# Patient Record
Sex: Male | Born: 1945 | Race: White | Hispanic: No | Marital: Married | Smoking: Former smoker
Health system: Southern US, Community
[De-identification: ages and names within clinical notes are randomized; demographics above are authoritative.]

## PROBLEM LIST (undated history)

## (undated) DIAGNOSIS — Z91199 Patient's noncompliance with other medical treatment and regimen due to unspecified reason: Secondary | ICD-10-CM

## (undated) DIAGNOSIS — I4891 Unspecified atrial fibrillation: Secondary | ICD-10-CM

## (undated) DIAGNOSIS — I5022 Chronic systolic (congestive) heart failure: Secondary | ICD-10-CM

## (undated) DIAGNOSIS — I739 Peripheral vascular disease, unspecified: Secondary | ICD-10-CM

## (undated) DIAGNOSIS — I2510 Atherosclerotic heart disease of native coronary artery without angina pectoris: Secondary | ICD-10-CM

## (undated) DIAGNOSIS — IMO0002 Reserved for concepts with insufficient information to code with codable children: Secondary | ICD-10-CM

## (undated) DIAGNOSIS — Z9119 Patient's noncompliance with other medical treatment and regimen: Secondary | ICD-10-CM

## (undated) DIAGNOSIS — I255 Ischemic cardiomyopathy: Secondary | ICD-10-CM

## (undated) DIAGNOSIS — F1011 Alcohol abuse, in remission: Secondary | ICD-10-CM

## (undated) DIAGNOSIS — E119 Type 2 diabetes mellitus without complications: Secondary | ICD-10-CM

## (undated) DIAGNOSIS — E785 Hyperlipidemia, unspecified: Secondary | ICD-10-CM

## (undated) HISTORY — DX: Hyperlipidemia, unspecified: E78.5

## (undated) HISTORY — PX: LUMBAR FUSION: SHX111

## (undated) HISTORY — PX: BACK SURGERY: SHX140

## (undated) HISTORY — DX: Peripheral vascular disease, unspecified: I73.9

## (undated) HISTORY — DX: Reserved for concepts with insufficient information to code with codable children: IMO0002

---

## 1993-12-22 DIAGNOSIS — I739 Peripheral vascular disease, unspecified: Secondary | ICD-10-CM

## 1993-12-22 HISTORY — PX: CHOLECYSTECTOMY: SHX55

## 1993-12-22 HISTORY — PX: AORTO-FEMORAL BYPASS GRAFT: SHX885

## 1993-12-22 HISTORY — DX: Peripheral vascular disease, unspecified: I73.9

## 2004-11-30 ENCOUNTER — Emergency Department (HOSPITAL_COMMUNITY): Admission: EM | Admit: 2004-11-30 | Discharge: 2004-11-30 | Payer: Self-pay | Admitting: Emergency Medicine

## 2005-01-12 ENCOUNTER — Emergency Department (HOSPITAL_COMMUNITY): Admission: EM | Admit: 2005-01-12 | Discharge: 2005-01-12 | Payer: Self-pay | Admitting: Emergency Medicine

## 2008-05-12 ENCOUNTER — Inpatient Hospital Stay (HOSPITAL_COMMUNITY): Admission: EM | Admit: 2008-05-12 | Discharge: 2008-05-17 | Payer: Self-pay | Admitting: Emergency Medicine

## 2008-05-12 ENCOUNTER — Ambulatory Visit: Payer: Self-pay | Admitting: Cardiology

## 2008-05-29 ENCOUNTER — Ambulatory Visit: Payer: Self-pay | Admitting: Cardiology

## 2008-09-05 ENCOUNTER — Ambulatory Visit: Payer: Self-pay | Admitting: Cardiology

## 2008-09-05 ENCOUNTER — Inpatient Hospital Stay (HOSPITAL_COMMUNITY): Admission: AD | Admit: 2008-09-05 | Discharge: 2008-09-07 | Payer: Self-pay | Admitting: Cardiology

## 2008-09-06 ENCOUNTER — Encounter: Payer: Self-pay | Admitting: Cardiology

## 2008-09-15 ENCOUNTER — Ambulatory Visit (HOSPITAL_COMMUNITY): Admission: RE | Admit: 2008-09-15 | Discharge: 2008-09-15 | Payer: Self-pay | Admitting: Family Medicine

## 2008-09-18 ENCOUNTER — Ambulatory Visit: Payer: Self-pay | Admitting: Cardiology

## 2008-11-28 ENCOUNTER — Emergency Department (HOSPITAL_COMMUNITY): Admission: EM | Admit: 2008-11-28 | Discharge: 2008-11-28 | Payer: Self-pay | Admitting: Emergency Medicine

## 2008-12-27 ENCOUNTER — Ambulatory Visit: Payer: Self-pay | Admitting: Cardiology

## 2009-03-09 ENCOUNTER — Encounter (INDEPENDENT_AMBULATORY_CARE_PROVIDER_SITE_OTHER): Payer: Self-pay | Admitting: *Deleted

## 2009-03-09 LAB — CONVERTED CEMR LAB
ALT: 16 units/L
AST: 14 units/L
Albumin: 4.7 g/dL
BUN: 14 mg/dL
CO2: 22 meq/L
Calcium: 9.7 mg/dL
Chloride: 104 meq/L
Cholesterol: 170 mg/dL
Creatinine, Ser: 0.96 mg/dL
Glucose, Bld: 179 mg/dL
HDL: 32 mg/dL
LDL Cholesterol: 93 mg/dL
Potassium: 4.7 meq/L
Sodium: 139 meq/L
Total Protein: 7.4 g/dL
Triglycerides: 225 mg/dL

## 2009-04-25 ENCOUNTER — Ambulatory Visit: Payer: Self-pay | Admitting: Cardiology

## 2009-06-04 ENCOUNTER — Encounter (INDEPENDENT_AMBULATORY_CARE_PROVIDER_SITE_OTHER): Payer: Self-pay | Admitting: *Deleted

## 2009-06-04 LAB — CONVERTED CEMR LAB
ALT: 11 units/L
AST: 9 units/L
Albumin: 4.1 g/dL
Alkaline Phosphatase: 67 units/L
CO2: 22 meq/L
Calcium: 9.2 mg/dL
Cholesterol: 165 mg/dL
Creatinine, Ser: 1.05 mg/dL
Free T4: 9 ng/dL
Glucose, Bld: 258 mg/dL
HCT: 51.3 %
HDL: 35 mg/dL
Hemoglobin: 17 g/dL
Hgb A1c MFr Bld: 9.5 %
MCV: 96.4 fL
Platelets: 179 10*3/uL
Potassium: 4.4 meq/L
Sodium: 139 meq/L
Total Protein: 7 g/dL
Triglycerides: 247 mg/dL
WBC: 8 10*3/uL

## 2010-01-29 ENCOUNTER — Inpatient Hospital Stay (HOSPITAL_COMMUNITY): Admission: EM | Admit: 2010-01-29 | Discharge: 2010-02-01 | Payer: Self-pay | Admitting: Cardiology

## 2010-01-29 ENCOUNTER — Ambulatory Visit: Payer: Self-pay | Admitting: Cardiology

## 2010-01-30 ENCOUNTER — Encounter (INDEPENDENT_AMBULATORY_CARE_PROVIDER_SITE_OTHER): Payer: Self-pay | Admitting: *Deleted

## 2010-01-30 LAB — CONVERTED CEMR LAB
ALT: 15 units/L
AST: 15 units/L
Albumin: 3.5 g/dL
Alkaline Phosphatase: 61 units/L
BUN: 9 mg/dL
CO2: 25 meq/L
Calcium: 8.5 mg/dL
Chloride: 99 meq/L
GFR calc non Af Amer: >60 mL/min
Glomerular Filtration Rate, Af Am: >60 mL/min/{1.73_m2}
Glucose, Bld: 212 mg/dL
Potassium: 4 meq/L
Sodium: 131 meq/L
Total Protein: 6.4 g/dL

## 2010-02-01 ENCOUNTER — Encounter (INDEPENDENT_AMBULATORY_CARE_PROVIDER_SITE_OTHER): Payer: Self-pay | Admitting: *Deleted

## 2010-02-01 LAB — CONVERTED CEMR LAB
BUN: 8 mg/dL
CO2: 27 meq/L
Chloride: 101 meq/L
GFR calc non Af Amer: >60 mL/min
Glomerular Filtration Rate, Af Am: >60 mL/min/{1.73_m2}
Glucose, Bld: 193 mg/dL
Potassium: 4.4 meq/L
Sodium: 133 meq/L

## 2010-02-08 ENCOUNTER — Encounter (INDEPENDENT_AMBULATORY_CARE_PROVIDER_SITE_OTHER): Payer: Self-pay | Admitting: *Deleted

## 2010-02-08 ENCOUNTER — Ambulatory Visit: Payer: Self-pay | Admitting: Cardiovascular Disease

## 2010-02-08 ENCOUNTER — Encounter: Payer: Self-pay | Admitting: Adult Health

## 2010-05-06 ENCOUNTER — Encounter (INDEPENDENT_AMBULATORY_CARE_PROVIDER_SITE_OTHER): Payer: Self-pay | Admitting: *Deleted

## 2010-05-06 DIAGNOSIS — I739 Peripheral vascular disease, unspecified: Secondary | ICD-10-CM | POA: Insufficient documentation

## 2010-05-06 DIAGNOSIS — E119 Type 2 diabetes mellitus without complications: Secondary | ICD-10-CM | POA: Insufficient documentation

## 2010-05-06 DIAGNOSIS — E785 Hyperlipidemia, unspecified: Secondary | ICD-10-CM | POA: Insufficient documentation

## 2010-05-06 DIAGNOSIS — F17200 Nicotine dependence, unspecified, uncomplicated: Secondary | ICD-10-CM | POA: Insufficient documentation

## 2010-05-07 ENCOUNTER — Encounter (INDEPENDENT_AMBULATORY_CARE_PROVIDER_SITE_OTHER): Payer: Self-pay | Admitting: *Deleted

## 2010-05-07 ENCOUNTER — Ambulatory Visit: Payer: Self-pay | Admitting: Cardiology

## 2010-05-28 ENCOUNTER — Encounter: Payer: Self-pay | Admitting: Cardiology

## 2010-06-07 ENCOUNTER — Encounter (INDEPENDENT_AMBULATORY_CARE_PROVIDER_SITE_OTHER): Payer: Self-pay | Admitting: *Deleted

## 2010-06-07 LAB — CONVERTED CEMR LAB
ALT: 13 units/L (ref 0–53)
AST: 11 units/L (ref 0–37)
Albumin: 4.5 g/dL (ref 3.5–5.2)
Alkaline Phosphatase: 66 units/L (ref 39–117)
BUN: 23 mg/dL (ref 6–23)
Basophils Absolute: 0.1 10*3/uL (ref 0.0–0.1)
Basophils Relative: 1 % (ref 0–1)
Calcium: 10.1 mg/dL (ref 8.4–10.5)
Chloride: 103 meq/L (ref 96–112)
Cholesterol: 255 mg/dL — ABNORMAL HIGH (ref 0–200)
Creatinine, Ser: 1.06 mg/dL (ref 0.40–1.50)
Eosinophils Absolute: 0.3 10*3/uL (ref 0.0–0.7)
Glucose, Bld: 280 mg/dL — ABNORMAL HIGH (ref 70–99)
HDL: 32 mg/dL — ABNORMAL LOW (ref >39)
Hemoglobin: 16.6 g/dL (ref 13.0–17.0)
LDL Cholesterol: 152 mg/dL — ABNORMAL HIGH (ref 0–99)
Lymphocytes Relative: 26 % (ref 12–46)
MCHC: 34.5 g/dL (ref 30.0–36.0)
Monocytes Relative: 7 % (ref 3–12)
Neutro Abs: 5.6 10*3/uL (ref 1.7–7.7)
Neutrophils Relative %: 63 % (ref 43–77)
Platelets: 204 10*3/uL (ref 150–400)
Potassium: 5.4 meq/L — ABNORMAL HIGH (ref 3.5–5.3)
RBC: 5.22 M/uL (ref 4.22–5.81)
RDW: 13.2 % (ref 11.5–15.5)
Sodium: 137 meq/L (ref 135–145)
Total Bilirubin: 0.4 mg/dL (ref 0.3–1.2)
Total CHOL/HDL Ratio: 8
Total Protein: 7.3 g/dL (ref 6.0–8.3)
Triglycerides: 356 mg/dL — ABNORMAL HIGH (ref <150)
VLDL: 71 mg/dL — ABNORMAL HIGH (ref 0–40)
WBC: 8.8 10*3/uL (ref 4.0–10.5)

## 2010-06-10 ENCOUNTER — Telehealth (INDEPENDENT_AMBULATORY_CARE_PROVIDER_SITE_OTHER): Payer: Self-pay | Admitting: *Deleted

## 2010-06-11 ENCOUNTER — Observation Stay (HOSPITAL_COMMUNITY): Admission: EM | Admit: 2010-06-11 | Discharge: 2010-06-12 | Payer: Self-pay | Admitting: Emergency Medicine

## 2010-06-12 ENCOUNTER — Encounter: Payer: Self-pay | Admitting: Cardiology

## 2010-06-12 ENCOUNTER — Ambulatory Visit: Payer: Self-pay | Admitting: Cardiology

## 2010-06-13 ENCOUNTER — Telehealth (INDEPENDENT_AMBULATORY_CARE_PROVIDER_SITE_OTHER): Payer: Self-pay | Admitting: *Deleted

## 2010-06-13 ENCOUNTER — Encounter (INDEPENDENT_AMBULATORY_CARE_PROVIDER_SITE_OTHER): Payer: Self-pay | Admitting: *Deleted

## 2010-07-08 LAB — CONVERTED CEMR LAB
Calcium: 9.4 mg/dL (ref 8.4–10.5)
Chloride: 104 meq/L (ref 96–112)
Creatinine, Ser: 0.94 mg/dL (ref 0.40–1.50)
Glucose, Bld: 255 mg/dL — ABNORMAL HIGH (ref 70–99)
HDL: 31 mg/dL — ABNORMAL LOW (ref >39)
LDL Cholesterol: 60 mg/dL (ref 0–99)
Potassium: 4.6 meq/L (ref 3.5–5.3)
Sodium: 140 meq/L (ref 135–145)
Total CHOL/HDL Ratio: 4.5
Triglycerides: 245 mg/dL — ABNORMAL HIGH (ref <150)
VLDL: 49 mg/dL — ABNORMAL HIGH (ref 0–40)

## 2010-08-06 ENCOUNTER — Telehealth (INDEPENDENT_AMBULATORY_CARE_PROVIDER_SITE_OTHER): Payer: Self-pay | Admitting: *Deleted

## 2010-08-07 ENCOUNTER — Encounter (INDEPENDENT_AMBULATORY_CARE_PROVIDER_SITE_OTHER): Payer: Self-pay | Admitting: *Deleted

## 2010-08-07 LAB — CONVERTED CEMR LAB
BUN: 10 mg/dL
BUN: 10 mg/dL
BUN: 10 mg/dL (ref 6–23)
CO2: 25 meq/L
CO2: 25 meq/L
CO2: 25 meq/L (ref 19–32)
Calcium: 9.3 mg/dL
Calcium: 9.3 mg/dL (ref 8.4–10.5)
Chloride: 102 meq/L
Chloride: 102 meq/L
Chloride: 102 meq/L (ref 96–112)
Creatinine, Ser: 0.93 mg/dL
Creatinine, Ser: 0.93 mg/dL
Creatinine, Ser: 0.93 mg/dL (ref 0.40–1.50)
Glucose, Bld: 153 mg/dL
Potassium: 4.7 meq/L
Potassium: 4.7 meq/L
Potassium: 4.7 meq/L (ref 3.5–5.3)
Sodium: 135 meq/L
Sodium: 135 meq/L
Sodium: 135 meq/L (ref 135–145)

## 2010-08-14 ENCOUNTER — Encounter (INDEPENDENT_AMBULATORY_CARE_PROVIDER_SITE_OTHER): Payer: Self-pay | Admitting: *Deleted

## 2010-08-17 ENCOUNTER — Ambulatory Visit: Payer: Self-pay | Admitting: Internal Medicine

## 2010-08-17 ENCOUNTER — Inpatient Hospital Stay (HOSPITAL_COMMUNITY): Admission: EM | Admit: 2010-08-17 | Discharge: 2010-08-20 | Payer: Self-pay | Admitting: Cardiology

## 2010-08-28 ENCOUNTER — Encounter: Payer: Self-pay | Admitting: Cardiology

## 2010-09-02 ENCOUNTER — Ambulatory Visit: Payer: Self-pay | Admitting: Cardiology

## 2010-09-03 ENCOUNTER — Encounter: Payer: Self-pay | Admitting: Adult Health

## 2010-09-12 ENCOUNTER — Ambulatory Visit: Payer: Self-pay | Admitting: Cardiology

## 2010-09-12 ENCOUNTER — Inpatient Hospital Stay (HOSPITAL_COMMUNITY): Admission: EM | Admit: 2010-09-12 | Discharge: 2010-09-15 | Payer: Self-pay | Admitting: Emergency Medicine

## 2010-09-13 ENCOUNTER — Encounter: Payer: Self-pay | Admitting: Cardiology

## 2010-09-13 ENCOUNTER — Encounter (INDEPENDENT_AMBULATORY_CARE_PROVIDER_SITE_OTHER): Payer: Self-pay | Admitting: Family Medicine

## 2010-10-01 ENCOUNTER — Telehealth (INDEPENDENT_AMBULATORY_CARE_PROVIDER_SITE_OTHER): Payer: Self-pay

## 2010-10-03 ENCOUNTER — Ambulatory Visit: Payer: Self-pay | Admitting: Cardiology

## 2010-10-03 ENCOUNTER — Encounter: Payer: Self-pay | Admitting: Adult Health

## 2010-10-04 ENCOUNTER — Encounter: Payer: Self-pay | Admitting: Adult Health

## 2010-11-04 ENCOUNTER — Ambulatory Visit (HOSPITAL_COMMUNITY): Admission: RE | Admit: 2010-11-04 | Payer: Self-pay | Admitting: Cardiology

## 2010-11-07 ENCOUNTER — Telehealth (INDEPENDENT_AMBULATORY_CARE_PROVIDER_SITE_OTHER): Payer: Self-pay | Admitting: *Deleted

## 2010-12-02 ENCOUNTER — Telehealth (INDEPENDENT_AMBULATORY_CARE_PROVIDER_SITE_OTHER): Payer: Self-pay | Admitting: *Deleted

## 2010-12-09 ENCOUNTER — Encounter (INDEPENDENT_AMBULATORY_CARE_PROVIDER_SITE_OTHER): Payer: Self-pay | Admitting: *Deleted

## 2011-01-03 ENCOUNTER — Telehealth (INDEPENDENT_AMBULATORY_CARE_PROVIDER_SITE_OTHER): Payer: Self-pay

## 2011-01-03 ENCOUNTER — Encounter (INDEPENDENT_AMBULATORY_CARE_PROVIDER_SITE_OTHER): Payer: Self-pay | Admitting: *Deleted

## 2011-01-03 ENCOUNTER — Encounter (INDEPENDENT_AMBULATORY_CARE_PROVIDER_SITE_OTHER): Payer: Self-pay

## 2011-01-07 ENCOUNTER — Ambulatory Visit: Admit: 2011-01-07 | Payer: Self-pay | Admitting: Cardiology

## 2011-01-08 ENCOUNTER — Ambulatory Visit (HOSPITAL_COMMUNITY)
Admission: RE | Admit: 2011-01-08 | Discharge: 2011-01-08 | Payer: Self-pay | Source: Home / Self Care | Attending: Cardiology | Admitting: Cardiology

## 2011-01-10 ENCOUNTER — Encounter: Payer: Self-pay | Admitting: Cardiology

## 2011-01-15 ENCOUNTER — Ambulatory Visit (HOSPITAL_COMMUNITY)
Admission: RE | Admit: 2011-01-15 | Discharge: 2011-01-15 | Payer: Self-pay | Source: Home / Self Care | Attending: Family Medicine | Admitting: Family Medicine

## 2011-01-20 ENCOUNTER — Encounter (INDEPENDENT_AMBULATORY_CARE_PROVIDER_SITE_OTHER): Payer: Self-pay | Admitting: *Deleted

## 2011-01-21 ENCOUNTER — Encounter: Payer: Self-pay | Admitting: Cardiology

## 2011-01-21 ENCOUNTER — Ambulatory Visit
Admission: RE | Admit: 2011-01-21 | Discharge: 2011-01-21 | Payer: Self-pay | Source: Home / Self Care | Attending: Cardiology | Admitting: Cardiology

## 2011-01-21 LAB — CONVERTED CEMR LAB
ALT: 12 units/L (ref 0–53)
Albumin: 4.3 g/dL (ref 3.5–5.2)
Alkaline Phosphatase: 54 units/L (ref 39–117)
BUN: 20 mg/dL (ref 6–23)
CO2: 24 meq/L (ref 19–32)
Calcium: 9.5 mg/dL (ref 8.4–10.5)
Chloride: 99 meq/L (ref 96–112)
Creatinine, Ser: 1.18 mg/dL (ref 0.40–1.50)
Glucose, Bld: 349 mg/dL — ABNORMAL HIGH (ref 70–99)
Hgb A1c MFr Bld: 13.5 % — ABNORMAL HIGH (ref <5.7)
Potassium: 4.3 meq/L (ref 3.5–5.3)
Sodium: 136 meq/L (ref 135–145)
Total Protein: 6.8 g/dL (ref 6.0–8.3)

## 2011-01-21 NOTE — Letter (Signed)
Summary: [employer] AUT[employer]IZATION  [employer] AUT[employer]IZATION   Imported By: Faythe Ghee 08/28/2010 14:18:47  _____________________________________________________________________  External Attachment:    Type:   Image     Comment:   External Document

## 2011-01-21 NOTE — Progress Notes (Signed)
Summary: rx refill  Phone Note From Pharmacy Call back at [phone]   Request: Speak with Nurse Summary of Call: pradaxa 150 mg needs called in to [facility]  pharmacy Initial call taken by: Faythe Ghee,  November 07, 2010 3:55 PM    Prescriptions: PRADAXA 150 MG CAPS (DABIGATRAN ETEXILATE MESYLATE) take 1 tab two times a day  #60 x 6   Entered by:   Teressa Lower RN   Authorized by:   Kathlen Brunswick, MD, [facility]   Signed by:   Teressa Lower RN on 11/07/2010   Method used:   Electronically to        [facility]* (retail)       [address]       [place], [place]  [address]       Ph: [phone] or [phone]       Fax: [phone]   RxID:   [id number]

## 2011-01-21 NOTE — Letter (Signed)
Summary: [place] [facility] Work Engineer, agricultural at [employer]  [address] [place] [address]   Phone: [phone]  Fax: [phone]     June 07, 2010 MRN: [id number]   [name] [address] [place], [place]  [address]      YOUR LAB WORK IS DUE   July 08, 2010 Please go to [facility], located across the street from [facility] on the second floor.  Hours are Monday - Friday 7am until 7:30pm         Saturday 8am until 12noon    _X_  DO NOT EAT OR DRINK AFTER MIDNIGHT EVENING PRIOR TO LABWORK  __ YOUR LABWORK IS NOT FASTING --YOU MAY EAT PRIOR TO LABWORK

## 2011-01-21 NOTE — Miscellaneous (Signed)
Summary: hospital labs  Clinical Lists Changes  Observations: Added new observation of CALCIUM: 8.6 mg/dL (98/10/9146 8:29) Added new observation of GFR AA: >60 mL/min/1.73m2 (02/01/2010 9:03) Added new observation of GFR: >60 mL/min (02/01/2010 9:03) Added new observation of CREATININE: 101 mg/dL (56/21/3086 5:78) Added new observation of BUN: 8 mg/dL (46/96/2952 8:41) Added new observation of BG RANDOM: 193 mg/dL (32/44/0102 7:25) Added new observation of CO2 PLSM/SER: 27 meq/L (02/01/2010 9:03) Added new observation of CL SERUM: 101 meq/L (02/01/2010 9:03) Added new observation of K SERUM: 4.4 meq/L (02/01/2010 9:03) Added new observation of NA: 133 meq/L (02/01/2010 9:03) Added new observation of CALCIUM: 8.5 mg/dL (36/64/4034 7:42) Added new observation of ALBUMIN: 3.5 g/dL (59/56/3875 6:43) Added new observation of PROTEIN, TOT: 6.4 g/dL (32/95/1884 1:66) Added new observation of SGPT (ALT): 15 units/L (01/30/2010 9:03) Added new observation of SGOT (AST): 15 units/L (01/30/2010 9:03) Added new observation of ALK PHOS: 61 units/L (01/30/2010 9:03) Added new observation of GFR AA: >60 mL/min/1.53m2 (01/30/2010 9:03) Added new observation of GFR: >60 mL/min (01/30/2010 9:03) Added new observation of CREATININE: 0.85 mg/dL (06/20/1600 0:93) Added new observation of BUN: 9 mg/dL (23/55/7322 0:25) Added new observation of BG RANDOM: 212 mg/dL (42/70/6237 6:28) Added new observation of CO2 PLSM/SER: 25 meq/L (01/30/2010 9:03) Added new observation of CL SERUM: 99 meq/L (01/30/2010 9:03) Added new observation of K SERUM: 4.0 meq/L (01/30/2010 9:03) Added new observation of NA: 131 meq/L (01/30/2010 9:03)

## 2011-01-21 NOTE — Assessment & Plan Note (Signed)
Summary: eph   Visit Type:  Follow-up Primary Provider:  Dr.Angus [name]  CC:  no complaints today.  History of Present Illness: Mr.Kobs is a 65 y/o CM with known history of CAD. We are seeing him on follow-up after being admitted to [facility] after c/o chest pain.  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  He also had PCI to SVG to RCA placed in overlapping fasion of prior stent placed in 01/26/2010.  He also had ICM with EF of 35%-40%. PVD, s/p Fem Pop 1995, CABG 1995, NIDDM, ongoing tobacco abuse.  Since discharge he has been pain free, no SOB.  He complains of soreness in his R groin when he climbs up into his truck only.  He unfortunately continues to smoke.  No plans to quit.  Otherwise, he is without complaint. He is NOT on BB secondary to symptomatic bradycardia.  Preventive Screening-Counseling & Management  Alcohol-Tobacco     Smoking Status: current     Smoking Cessation Counseling: yes     Smoke Cessation Stage: precontemplative     Packs/Day: 1.0  Current Medications (verified): 1)  Metformin Hcl 500 Mg Tabs (Metformin Hcl) .... One Tablet Twice A Day 2)  Plavix 75 Mg Tabs (Clopidogrel Bisulfate) .... Take 1 Tab Daily 3)  Nitroglycerin 0.4 Mg/hr Pt24 (Nitroglycerin) .... Take As Directed For Chest Pain 4)  Simvastatin 40 Mg Tabs (Simvastatin) .... Take 1 Tab Daily 5)  Aspir-Trin 325 Mg Tbec (Aspirin) .... Take 1 Tab Daily 6)  Vicodin 5-500 Mg Tabs (Hydrocodone-Acetaminophen) .... Take As Needed For Pain  Allergies (verified): No Known Drug Allergies PMH-FH-SH reviewed-no changes except otherwise noted  Social History: Packs/Day:  1.0  Review of Systems       Right groin soreness  All other systems have been reviewed and are negative unless stated  above.   Vital Signs:  Patient profile:   65 year old male Height:      69 inches Weight:      198 pounds BMI:     29.35 Pulse rate:   64 / minute BP sitting:   132 / 74  (right arm)  Vitals Entered By: Dreama Saa, CNA (February 08, 2010 11:18 AM)  Physical Exam  General:  Well developed, well nourished, in no acute distress. Lungs:  Clear bilaterally to auscultation and percussion. Heart:  Non-displaced PMI, chest non-tender; regular rate and rhythm, S1, S2 without murmurs, rubs or gallops. Carotid upstroke normal, no bruit. Normal abdominal aortic size, no bruits. Femorals R bruit noted, good pulses, Pedals normal pulses. No edema, no varicosities. Abdomen:  Bowel sounds positive; abdomen soft and non-tender without masses, organomegaly, or hernias noted. No hepatosplenomegaly. Msk:  Back normal, normal gait. Muscle strength and tone normal. Extremities:  No bleed.,hematoma or bruising at R groin site. Psych:  Normal affect.   EKG  Procedure date:  02/08/2010  Findings:      Normal sinus rhythm with rate of:65 bpm  First degree AV-Block noted.    Comments:      Evidence of remote anterior MI.  Evidence of remote inferior MI.    Impression & Recommendations:  Problem # 1:  CORONARY ATHEROSCLEROSIS NATIVE CORONARY ARTERY (ICD-414.01) He is doing well s/p PCI and PTCA of existing  stents.  He is asymptomatic.  He will not be placed on BB as he is intolerant causing symptomatic bradycardia.  This was removed during hospitalization. The following medications were removed from the medication list:    Metoprolol Tartrate 25 Mg Tabs (Metoprolol tartrate) .Marland Kitchen... Take 1 tablet by mouth once a day His updated medication list for this problem includes:    Plavix 75 Mg Tabs (Clopidogrel bisulfate) .Marland Kitchen... Take 1 tab daily    Nitroglycerin 0.4 Mg/hr Pt24 (Nitroglycerin) .Marland Kitchen... Take as directed for chest pain    Aspir-trin 325 Mg Tbec (Aspirin) .Marland Kitchen... Take 1 tab daily  Problem # 2:   TOBACCO ABUSE (ICD-305.1) Continued counseling of cessation is  done.  He verbalzes understanding but is not planning to quit.  Problem # 3:  PVD (ICD-443.9) I auscultated a R femeral bruit. With known Fem/Pop bypass, this should be monitored for need for reevaluation.  Problem # 4:  MIXED HYPERLIPIDEMIA (ICD-272.2) Continue meds as directed. Follow up lipids and LFT's on next visit. His updated medication list for this problem includes:    Simvastatin 40 Mg Tabs (Simvastatin) .Marland Kitchen... Take 1 tab daily  Patient Instructions: 1)  Your physician recommends that you schedule a follow-up appointment in: 3 months

## 2011-01-21 NOTE — Letter (Signed)
Summary: [place] Results Engineer, agricultural at [facility]  [address] [place] [address]   Phone: [phone]  Fax: [phone]      June 07, 2010 MRN: [id number]   [name] [address] [place], [place]  [address]   Dear [name],  Your test ordered by Selena Batten has been reviewed by your physician (or physician assistant) and was found to be normal or stable. Your physician (or physician assistant) felt no changes were needed at this time.  ____ Echocardiogram  ____ Cardiac Stress Test  __x__ Lab Work  ____ Peripheral vascular study of arms, legs or neck  ____ CT scan or X-ray  ____ Lung or Breathing test  ____ Other:  Please discontinue simvastatin and start crestor 40mg  daily.  We will recheck your cholesterol in 1 month.  I will send the paperwork in the mail to take to the lab.  Enclosed is a copy of your labwork for your records.  Thank you, [name] RN    [place] Bing, MD, [facility] Arena.C.Gaylord Shih, MD, F.A.C.C Lewayne Bunting, MD, F.A.C.C Nona Dell, MD, F.A.C.C Charlton Haws, MD, Lenise Arena.C.C

## 2011-01-21 NOTE — Assessment & Plan Note (Signed)
Summary: rov sob and chest pain   Visit Type:  Follow-up Primary Provider:  Dr.Angus [name]   History of Present Illness: [name] is a 65 y/o obese CM with known history of DM, Hypercholesterolemia,  CAD-s/p CABG 12/2009; hospitalization in August for graft occlusion with stent thrombosis of RCA.  He has atrial fibrillation and is now on Pradaxa BID ,but has only been taking it once a day because he did not want to run out because he is short of money.  He was admtted again to [facility] 09/19/2010 for recurrent chest pain and shortness of breath.  He was evaluated by Dr, Diona Browner during that hospitlization and medical management was recommended.  He unfortuately continues to smoke, and does not check is blood sugar regularly.Discharge medication list has lanoxin 0.125mg  but he is not taking this.  He was also listed to be taking plavix in addition to his pradaxa, but he is not taking this either.  He complains of chronic DOE and chronic back pain. He is stating he is unable to do a full work load and wishes to remain on 1/2 duty.  He is considering early retirement.  Current Medications (verified): 1)  Metformin Hcl 500 Mg Tabs (Metformin Hcl) .... One Tablet Two Times A Day 2)  Nitroglycerin 0.4 Mg/hr Pt24 (Nitroglycerin) .... Take As Directed For Chest Pain 3)  Crestor 40 Mg Tabs (Rosuvastatin Calcium) .... Take 1 Tablet By Mouth Once A Day 4)  Aspir-Trin 325 Mg Tbec (Aspirin) .... Take 1 Tab Daily 5)  Lisinopril 20 Mg Tabs (Lisinopril) .... Take 1 Tablet By Mouth Once A Day 6)  Pradaxa 150 Mg Caps (Dabigatran Etexilate Mesylate) .... Take 1 Tab Two Times A Day 7)  Carvedilol 12.5 Mg Tabs (Carvedilol) .... Take 1 Tab Two Times A Day 8)  Hydrocodone-Acetaminophen 5-500 Mg Tabs (Hydrocodone-Acetaminophen) .... Use As Needed For Pain  Allergies (verified): No Known Drug Allergies  Comments:  Nurse/Medical Assistant: patient brought med list from hospital discharge also reviewed  previous med  list from last ov patient states he has only been taking 1  pradaxa 150mg  for a week now due to he can't have it filled til friday.  Review of Systems       DOE, occasional chest pain.  Back pain.  All other systems have been reviewed and are negative unless stated above.   Vital Signs:  Patient profile:   65 year old male Weight:      197 pounds BMI:     29.20 Pulse rate:   84 / minute BP sitting:   105 / 62  (right arm)  Vitals Entered By: Dreama Saa, CNA (October 03, 2010 3:05 PM)  Physical Exam  General:  Well developed, well nourished, in no acute distress. Lungs:  Clear bilaterally to auscultation and percussion. Smells of cigarrettes. Heart:  Irregular rhythum. No rubs or gallops. Abdomen:  Bowel sounds positive; abdomen soft and non-tender without masses, organomegaly, or hernias noted. No hepatosplenomegaly. Msk:  Back normal, normal gait. Muscle strength and tone normal. Pulses:  pulses normal in all 4 extremities Extremities:  No clubbing or cyanosis. Neurologic:  Alert and oriented x 3. Psych:  Normal affect.   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  We  have given him a Rx card for assistance and also he as applied for drug assistance with this.  I have counseled him on medical compliance. His updated medication list for this problem includes:    Aspir-trin 325 Mg Tbec (Aspirin) .Marland Kitchen... Take 1 tab daily    Carvedilol 12.5 Mg Tabs (Carvedilol) .Marland Kitchen... Take 1 tab two times a day  Problem # 2:  ATHEROSCLEROTIC CARDIOVASCULAR DISEASE-CABG (ICD-429.2) I have had him complete a 6 minute walk here in the office with O2 sat and rhythm strip.  O2 Sats did not change (97%) and HR remained stable.  He had complaints of back pain  only.  I have allowed him to go back to full duty, but to call us if this is a problem.  Echocardiogram will be completed in one month s/p MI 3 months earlier to evaluate EF% and compare to prior EF of 35%. I am concerned that he is not taking responsibility for his health maintenance which I explained to him would be detrimental to him overall.  He verbalizes understanding. Orders: 2-D Echocardiogram (2D Echo)  Problem # 3:  TOBACCO ABUSE (ICD-305.1) I have again counselled him on cessation.  He says he is trying to quit.  Problem # 4:  DIABETES MELLITUS, TYPE II (ICD-250.00) I have advised him to keep up with blood glucose and speak with primary for any problems with control.   His updated medication list for this problem includes:    Metformin Hcl 500 Mg Tabs (Metformin hcl) ..... One tablet two times a day    Aspir-trin 325 Mg Tbec (Aspirin) .Marland Kitchen... Take 1 tab daily    Lisinopril 20 Mg Tabs (Lisinopril) .Marland Kitchen... Take 1 tablet by mouth once a day  Patient Instructions: 1)  Your physician recommends that you schedule a follow-up appointment in: 3 MONTHS 2)  Your physician recommends that you continue on your current medications as directed. Please refer to the Current Medication list given to you today. 3)  Your physician has requested that you have an echocardiogram.  Echocardiography is a painless test that uses sound waves to create images of your heart. It provides your doctor with information about the size and shape of your heart and how well your heart's chambers and valves are working.  This procedure takes approximately one hour. There are no restrictions for this procedure.  Appended Document: rov sob and chest pain    Prescriptions: CRESTOR 20 MG TABS (ROSUVASTATIN CALCIUM) take 2 tabs (40mg ) by mouth at bedtime  #14 boxes(98) x 0   Entered and Authorized by:   Larita Fife Via LPN   Signed by:   Larita Fife Via LPN on 16/09/9603   Method used:   Samples Given   RxID:   [id number]

## 2011-01-21 NOTE — Letter (Signed)
Summary: Work Writer at [employer]  [address] [place] [address]   Phone: [phone]  Fax: [phone]     May 07, 2010    [facility]   The above named patient had a medical visit today at: 11:15     am   Please take this into consideration when reviewing the time away from work/school.      Sincerely yours,  Architectural technologist

## 2011-01-21 NOTE — Letter (Signed)
Summary: [place] [employer] Work Engineer, agricultural at [employer]  [address], [place] [address]   Phone: [phone]  Fax: [phone]     May 07, 2010 MRN: [id number]   [name] [address] [place], [place]  [address]      YOUR LAB WORK IS DUE  August 07, 2010 _________________________________________  Please go to [facility], located across the street from [facility] on the second floor.  Hours are Monday - Friday 7am until 7:30pm         Saturday 8am until 12noon    __  DO NOT EAT OR DRINK AFTER MIDNIGHT EVENING PRIOR TO LABWORK  _x_ YOUR LABWORK IS NOT FASTING --YOU MAY EAT PRIOR TO LABWORK

## 2011-01-21 NOTE — Letter (Signed)
Summary: Return To Work  Architectural technologist at [employer]. [address], [place] [address]   Phone: [phone]  Fax: [phone]    06/13/2010  TO: Leodis Sias IT MAY CONCERN   RE: TAIYO KOZMA [name] [address] [place]   The above named individual is under my medical care and may return to work on: 06/14/2010  If you have any further questions or need additional information, please call.     Sincerely,       Dr. Palmona Park Bing, MD [facility]

## 2011-01-21 NOTE — Progress Notes (Signed)
Summary: NEED LETTER FOR WORK  Phone Note Call from Patient Call back at Home Phone [phone]   Caller: PT WALK IN Reason for Call: Talk to Nurse Summary of Call: PT NEEDS A LETTER TO BE OUT OF WORK FOR 06-11-10 THUR 06-13-10 DUE TO BEING IN THE HOSPITAL WAS DISCHARGED ON 06-12-10. Initial call taken by: Faythe Ghee,  June 13, 2010 10:11 AM  Follow-up for Phone Call        Phone Call Completed letter completed Follow-up by: Teressa Lower RN,  June 13, 2010 1:42 PM

## 2011-01-21 NOTE — Miscellaneous (Signed)
Summary: [facility]  Clinical Lists Changes  Observations: Added new observation of CALCIUM: 9.2 mg/dL (81/19/1478 29:56) Added new observation of ALBUMIN: 4.1 g/dL (21/30/8657 84:69) Added new observation of PROTEIN, TOT: 7.0 g/dL (62/95/2841 32:44) Added new observation of SGPT (ALT): 11 units/L (06/04/2009 12:19) Added new observation of SGOT (AST): 9 units/L (06/04/2009 12:19) Added new observation of ALK PHOS: 67 units/L (06/04/2009 12:19) Added new observation of CREATININE: 1.05 mg/dL (12/24/7251 66:44) Added new observation of BUN: 17 mg/dL (03/47/4259 56:38) Added new observation of BG RANDOM: 258 mg/dL (75/64/3329 51:88) Added new observation of CO2 PLSM/SER: 22 meq/L (06/04/2009 12:19) Added new observation of CL SERUM: 103 meq/L (06/04/2009 12:19) Added new observation of K SERUM: 4.4 meq/L (06/04/2009 12:19) Added new observation of NA: 139 meq/L (06/04/2009 12:19) Added new observation of HDL: 35 mg/dL (41/66/0630 16:01) Added new observation of TRIGLYC TOT: 247 mg/dL (09/32/3557 32:20) Added new observation of CHOLESTEROL: 165 mg/dL (25/42/7062 37:62) Added new observation of PLATELETK/UL: 179 K/uL (06/04/2009 12:19) Added new observation of MCV: 96.4 fL (06/04/2009 12:19) Added new observation of HCT: 51.3 % (06/04/2009 12:19) Added new observation of HGB: 17.0 g/dL (83/15/1761 60:73) Added new observation of WBC COUNT: 8.0 10*3/microliter (06/04/2009 12:19) Added new observation of T4, FREE: 9.0 ng/dL (71/05/2693 85:46) Added new observation of HGBA1C: 9.5 % (06/04/2009 12:19) Added new observation of CALCIUM: 9.7 mg/dL (27/02/5008 38:18) Added new observation of ALBUMIN: 4.7 g/dL (29/93/7169 67:89) Added new observation of PROTEIN, TOT: 7.4 g/dL (38/09/1750 02:58) Added new observation of SGPT (ALT): 16 units/L (03/09/2009 12:19) Added new observation of SGOT (AST): 14 units/L (03/09/2009 12:19) Added new observation of ALK PHOS: 64 units/L (03/09/2009  12:19) Added new observation of CREATININE: 0.96 mg/dL (52/77/8242 35:36) Added new observation of BUN: 14 mg/dL (14/43/1540 08:67) Added new observation of BG RANDOM: 179 mg/dL (61/95/0932 67:12) Added new observation of CO2 PLSM/SER: 22 meq/L (03/09/2009 12:19) Added new observation of CL SERUM: 104 meq/L (03/09/2009 12:19) Added new observation of K SERUM: 4.7 meq/L (03/09/2009 12:19) Added new observation of NA: 139 meq/L (03/09/2009 12:19) Added new observation of LDL: 93 mg/dL (45/80/9983 38:25) Added new observation of HDL: 32 mg/dL (05/39/7673 41:93) Added new observation of TRIGLYC TOT: 225 mg/dL (79/01/4096 35:32) Added new observation of CHOLESTEROL: 170 mg/dL (99/24/2683 41:96)

## 2011-01-21 NOTE — Assessment & Plan Note (Signed)
Summary: 3 MTH F/U PER CHECKUOT ON 02/08/10/TG   Visit Type:  Follow-up Primary Provider:  Dr.Angus [name]   History of Present Illness: [name] returns to the office as scheduled for continued assessment and treatment of coronary artery disease and cardiovascular risk factors.  Since undergoing percutaneous intervention in the LIMA graft nearly 4 months ago, he has done well.  He reports no chest discomfort nor dyspnea.  He has experienced pain in his hips with walking and wonders whether this is claudication.  He continues to smoke cigarettes at the rate of one pack per day.  He stopped for a period of 3 years in the past, but has been unable to mount a similar effort in recent years.  Current Medications (verified): 1)  Metformin Hcl 500 Mg Tabs (Metformin Hcl) .... One Tablet Daily 2)  Plavix 75 Mg Tabs (Clopidogrel Bisulfate) .... Take 1 Tab Daily 3)  Nitroglycerin 0.4 Mg/hr Pt24 (Nitroglycerin) .... Take As Directed For Chest Pain 4)  Simvastatin 40 Mg Tabs (Simvastatin) .... Take 1 Tab Daily 5)  Aspir-Trin 325 Mg Tbec (Aspirin) .... Take 1 Tab Daily 6)  Lisinopril 20 Mg Tabs (Lisinopril) .... Take 1 Tablet By Mouth Once A Day  Allergies (verified): No Known Drug Allergies  Past History:  PMH, FH, and Social History reviewed and updated.  Past Medical History: ASCVD: Anterior MI in 4/95; PTCA of 90% proximal and distal LAD unsuccessful-->  Urgent CABG-4/95  Totally obstructed circumflex; 50% RCA; anterior hypokinesis; EF of 50-55%; 08/2008: 3-V disease plus a    95% LIMA stenosis, treated with DES; occlusion of SVG to CX; RCA SVG stenosis--> PCI with DES   2010: in-stent restenosis in the LIMA-->cutting balloon; EF of 35-40% ______________________________________________ Paroxysmal atrial arrhythmias-1995, 2009; bradycardia with beta blocker therapy Tobacco abuse: 40-50 pack years; currently one pack per day Peripheral vascular disease-abdominal aortic  obstruction-1995-->vascular surgery Hyperlipidemia AODM DDD-discectomy and fusion at L4 and L5 Excessive alcohol use- quit in 1999  Review of Systems       See history of present illness.  Vital Signs:  Patient profile:   65 year old male Weight:      203 pounds Pulse rate:   59 / minute BP sitting:   145 / 67  (right arm)  Vitals Entered By: Dreama Saa, CNA (May 07, 2010 11:32 AM)  Physical Exam  General:    Overweight; well developed; no acute distress:   Neck-No JVD; no carotid bruits: Lungs-No tachypnea, no rales; expiratory rhonchi and minor wheezes Cardiovascular-normal PMI; normal S1 and S2: Abdomen-BS normal; soft and non-tender without masses or organomegaly:  Musculoskeletal-No deformities, no cyanosis or clubbing: Neurologic-Normal cranial nerves; symmetric strength and tone:  Skin-Warm, no significant lesions: Extremities-decreased distal pulses; no edema; by Doppler, dorsalis pedis pulses  unobtainable, a feeling normal posterior tibial was present on the right and a monophasic decreased posterior tibial noted on the left     Impression & Recommendations:  Problem # 1:  ATHEROSCLEROTIC CARDIOVASCULAR DISEASE-CABG (ICD-429.2) Patient is stable from a symptomatic standpoint.  We will continue to attempt to optimize control of risk factors.  Plavix will be continued for at least one year.  Problem # 2:  TOBACCO ABUSE (ICD-305.1) Patient is currently unable to commit to a quit attempt, but the fact that he refrained from cigarette smoking for 3 years after bypass surgery is somewhat hopeful.  I will continue to discuss this with him at every office visit.  Problem # 3:  HYPERLIPIDEMIA (ICD-272.4)  Lipid profile and chemistry profile to be obtained.  Problem # 4:  PAROXYSMAL ATRIAL FIBRILLATION/FLUTTER (ICD-427.31) Assessment: New No recent documentation of arrhythmia.  Problem # 5:  PERIPHERAL VASCULAR DISEASE (ICD-443.9) Patient underwent aortobifemoral  grafting in 1995, apparently successful and most likely still patent.  He does have significant PVD, but it is not clear that current symptoms are related to that entity.  His ability to walk is much better than prior to his vascular surgery.  He will seek further evaluation should symptoms worsen.  Due to the presence of diabetes and substantial vascular disease, lisinopril 20 mg q.d. will be added to his medical regime for additional blood pressure control, to prevent arrhythmic and ischemic events and to provide renal protection.  I will plan to see this nice gentleman again in one year.  Other Orders: Future Orders: T-Comprehensive Metabolic Panel ([id number]) ... 05/28/2010 T-Basic Metabolic Panel [id number]) ... 08/07/2010 T-Lipid Profile [id number]) ... 05/28/2010 T-CBC w/Diff ([id number]) ... 05/28/2010  Patient Instructions: 1)  Your physician recommends that you schedule a follow-up appointment in: 1 year 2)  Your physician recommends that you return for lab work in:3 weeks and 3 months 3)  Your physician has recommended you make the following change in your medication: start lisinopril 20mg  daily Prescriptions: LISINOPRIL 20 MG TABS (LISINOPRIL) Take 1 tablet by mouth once a day  #30 x 6   Entered and Authorized by:   Kathlen Brunswick, MD, [facility]   Signed by:   Kathlen Brunswick, MD, [facility] on 05/07/2010   Method used:   Electronically to        [facility]* (retail)       [address]       [place], [place]  [address]       Ph: [phone] or [phone]       Fax: [phone]   RxID:   [id number]

## 2011-01-21 NOTE — Letter (Signed)
Summary: Work Writer at [employer]  [address], [place] [address]   Phone: [phone]  Fax: [phone]     February 08, 2010    [facility]   The above named patient had a medical visit today at:  11:30    am  Please take this into consideration when reviewing the time away from work/school.      Sincerely yours,  Architectural technologist

## 2011-01-21 NOTE — Progress Notes (Signed)
Summary: WOULD LIKE ANOTHER NOTE FOR WORK  Phone Note Call from Patient Call back at Home Phone [phone]   Caller: PT WALK IN Reason for Call: Talk to Nurse Summary of Call: PT WAS GIVEN A NOTE TO BE OUT OF WORK TILL 09-06-10 AND AFTER THAT LIGHT DUTY AND NO LIFTING OVER 15 LBS FOR A MONTH. THE TIME FRAME IS GOING TO END ON 10/06/10 AND HE WOULD LIKE IT TO BE EXTENDED FOR A WHILE LONGER. HE STATES THAT HE IS STILL HAVING SOB AND CHEST PAIN ON OCCATION. Initial call taken by: Faythe Ghee,  October 01, 2010 1:39 PM  Follow-up for Phone Call        Pt. advised that he needs to be seen for SOB and CP. Appt. scheduled for 10-03-10 with [name], NP. Follow-up by: Larita Fife Via LPN,  October 01, 2010 3:16 PM     Appended Document: WOULD LIKE ANOTHER NOTE FOR WORK Agree with this.. If he is still symptomatic should be seen.    [name] NP

## 2011-01-21 NOTE — Consult Note (Signed)
Summary: [facility]   [facility]   Imported By: Roderic Ovens 10/07/2010 17:05:23  _____________________________________________________________________  External Attachment:    Type:   Image     Comment:   External Document

## 2011-01-21 NOTE — Progress Notes (Signed)
Summary: MED QUESTIONS  Phone Note Call from Patient Call back at Home Phone [phone]   Caller: PT Reason for Call: Talk to Nurse Summary of Call: PT WAS CALLED LAST WEEK ABOUT CHANGING HIS MEDICATIONS AND HE DOESNT KNOW WHAT WAS CHANGED. Initial call taken by: Faythe Ghee,  June 10, 2010 11:24 AM  Follow-up for Phone Call        [facility] Follow-up by: Teressa Lower RN,  June 10, 2010 11:50 AM  Additional Follow-up for Phone Call Additional follow up Details #1::        pt received letter in the mail today with all my written instructions for his medication changes.  pt is in the ED with chest pain.  Pt has not picked up meds at this time Additional Follow-up by: Teressa Lower RN,  June 11, 2010 4:59 PM

## 2011-01-21 NOTE — Assessment & Plan Note (Signed)
Summary: post hosp [facility] per Rhonda/tg   Visit Type:  Follow-up Primary Provider:  Dr.Angus [name]  CC:  some chestpain saturday night.  History of Present Illness: [name] is a 64 y/o CM we are seeing on hosptial follow-up.  He has a known history of DM, CAD, with CABG, PCI to RCA and LIMA graft in Jan 2011, and now recent hospitalization for NSTEMI secondary to graft occlusion.  He subsequently had cardiac catherization with LIMA-LAD stent patent but SVG to RCA was found to be occluded.  He had stent thrombosis in RCA.  He was also found to be in atrial fib on admission. He was started on Pradaxa and Plavix was discontinued.  Statins were added along with Coreg.  He is here without complaint at this time.  He did have one episode of chest discomfort that woke him from sleep 4 days ago, which was relieved with NTG.  He continues to smoke but has cut down significantly, one pack total since discharge 3 weeks ago.  He states he has smoked for a long time and is doing his best to quit.  Current Medications (verified): 1)  Metformin Hcl 500 Mg Tabs (Metformin Hcl) .... One Tablet Two Times A Day 2)  Nitroglycerin 0.4 Mg/hr Pt24 (Nitroglycerin) .... Take As Directed For Chest Pain 3)  Crestor 40 Mg Tabs (Rosuvastatin Calcium) .... Take 1 Tablet By Mouth Once A Day 4)  Aspir-Trin 325 Mg Tbec (Aspirin) .... Take 1 Tab Daily 5)  Lisinopril 20 Mg Tabs (Lisinopril) .... Take 1 Tablet By Mouth Once A Day 6)  Pradaxa 150 Mg Caps (Dabigatran Etexilate Mesylate) .... Take 1 Tab Two Times A Day 7)  Carvedilol 12.5 Mg Tabs (Carvedilol) .... Take 1 Tab Two Times A Day  Allergies (verified): No Known Drug Allergies  Past History:  Past medical, surgical, family and social histories (including risk factors) reviewed, and no changes noted (except as noted below).  Past Medical History: Reviewed history from 05/07/2010 and no changes required. ASCVD: Anterior MI in 4/95; PTCA of 90% proximal and distal  LAD unsuccessful-->  Urgent CABG-4/95  Totally obstructed circumflex; 50% RCA; anterior hypokinesis; EF of 50-55%; 08/2008: 3-V disease plus a    95% LIMA stenosis, treated with DES; occlusion of SVG to CX; RCA SVG stenosis--> PCI with DES   2010: in-stent restenosis in the LIMA-->cutting balloon; EF of 35-40% ______________________________________________ Paroxysmal atrial arrhythmias-1995, 2009; bradycardia with beta blocker therapy Tobacco abuse: 40-50 pack years; currently one pack per day Peripheral vascular disease-abdominal aortic obstruction-1995-->vascular surgery Hyperlipidemia AODM DDD-discectomy and fusion at L4 and L5 Excessive alcohol use- quit in 1999  Past Surgical History: Reviewed history from 05/06/2010 and no changes required. Cholecystectomy-1995 Lumbosacral discectomy and fusion x2-L4 and L5 Aorto-bifemoral bypass in 1995  Family History: Reviewed history from 02/08/2010 and no changes required. Father:deceased of myocardial infarction  Social History: Reviewed history from 05/06/2010 and no changes required. Employment-[employer] Married  Tobacco Use - 40-50 pack years; currently one pack per day Alcohol Use - history of abuse discontinued in 1999 Regular Exercise - no Drug Use - no(quit 1999)  Review of Systems       Transient chest pain relieved with NTG 4 days prior to office visit.  All other systems have been reviewed and are negative unless stated above.   Vital Signs:  Patient profile:   64 year old male Weight:      195 pounds BMI:     28.90 Pulse rate:  59 / minute BP sitting:   115 / 67  (right arm)  Vitals Entered By: Dreama Saa, CNA (September 02, 2010 2:06 PM)  Physical Exam  General:  Well developed, well nourished, in no acute distress. Lungs:  Clear bilaterally to auscultation and percussion. Heart:  Distant without MRG. Abdomen:  Obese, NT 2+ BS Msk:  Back normal, normal gait. Muscle strength and tone normal. Pulses:   pulses normal in all 4 extremities Extremities:  No clubbing or cyanosis. Neurologic:  Alert and oriented x 3. Psych:  Normal affect.   EKG  Procedure date:  09/02/2010  Findings:      Left axis deviation.    Impression & Recommendations:  Problem # 1:  ATHEROSCLEROTIC CARDIOVASCULAR DISEASE-CABG (ICD-429.2) He has had recent hosptialization for NSTEMI with occlusion of RCA stent with thrombus.  He has been started on Pradaxa and Plavix has been discontinued.  His EF is down to 35%.  We will need to follow him closely for bleeding issues and for fluid overload.  He is advised on low salt heart healthy diet and to quit smoking.  He has transient chest pain relieved with NTG once since discharge.  He is to call us for recurrent pain.  Problem # 2:  PAROXYSMAL ATRIAL FIBRILLATION/FLUTTER (ICD-427.31) Remains in NSR and will continue on Coreg. The following medications were removed from the medication list:    Plavix 75 Mg Tabs (Clopidogrel bisulfate) .Marland Kitchen... Take 1 tab daily His updated medication list for this problem includes:    Aspir-trin 325 Mg Tbec (Aspirin) .Marland Kitchen... Take 1 tab daily    Carvedilol 12.5 Mg Tabs (Carvedilol) .Marland Kitchen... Take 1 tab two times a day  Problem # 3:  HYPERLIPIDEMIA (ICD-272.4) Continue statin with follow-up lipids in 3 months. His updated medication list for this problem includes:    Crestor 40 Mg Tabs (Rosuvastatin calcium) .Marland Kitchen... Take 1 tablet by mouth once a day  Patient Instructions: 1)  Your physician recommends that you schedule a follow-up appointment in: 3 months 2)  Your physician recommends that you continue on your current medications as directed. Please refer to the Current Medication list given to you today.

## 2011-01-21 NOTE — Miscellaneous (Signed)
Summary: labs bmp,08/07/10  Clinical Lists Changes  Observations: Added new observation of CALCIUM: 9.3 mg/dL (16/09/9603 54:09) Added new observation of CREATININE: 0.93 mg/dL (81/19/1478 29:56) Added new observation of BUN: 10 mg/dL (21/30/8657 84:69) Added new observation of BG RANDOM: 153 mg/dL (62/95/2841 32:44) Added new observation of CO2 PLSM/SER: 25 meq/L (08/07/2010 12:10) Added new observation of CL SERUM: 102 meq/L (08/07/2010 12:10) Added new observation of K SERUM: 4.7 meq/L (08/07/2010 12:10) Added new observation of NA: 135 meq/L (08/07/2010 12:10)

## 2011-01-21 NOTE — Progress Notes (Signed)
Summary: RX REFILL  Phone Note From Pharmacy   Caller: [facility]* Reason for Call: Needs renewal Summary of Call: PT NEEDS HIS NITRO CALLED IN TO [facility]. Initial call taken by: Faythe Ghee,  August 06, 2010 3:58 PM  Follow-up for Phone Call        rx sent pharmacy Follow-up by: Teressa Lower RN,  August 07, 2010 9:30 AM    Prescriptions: NITROGLYCERIN 0.4 MG/HR PT24 (NITROGLYCERIN) take as directed for chest pain  #25 x 3   Entered by:   Teressa Lower RN   Authorized by:   Kathlen Brunswick, MD, [facility]   Signed by:   Teressa Lower RN on 08/07/2010   Method used:   Electronically to        [facility] (retail)       [address]       [place], [place]  [address]       Ph: [phone] or [phone]       Fax: [phone]   RxID:   [id number]

## 2011-01-23 NOTE — Miscellaneous (Signed)
Summary: Orders Update  Clinical Lists Changes  Orders: Added new Referral order of 2-D Echocardiogram (2D Echo) - Signed 

## 2011-01-23 NOTE — Letter (Signed)
Summary: [employer], agricultural at [facility]  [address], [place] [address]   Phone: [phone]  Fax: [phone]      January 10, 2011 MRN: [id number]   [name] [address] [place], [place]  [address]   Dear Mr. Seim,  Your test ordered by Selena Batten has been reviewed by your physician (or physician assistant) and was found to be normal or stable. Your physician (or physician assistant) felt no changes were needed at this time.  __X__ Echocardiogram  ____ Cardiac Stress Test  ____ Lab Work  ____ Peripheral vascular study of arms, legs or neck  ____ CT scan or X-ray  ____ Lung or Breathing test  ____ Other:  Please continue on current medical treatment.  Thank you.   Newberry Bing, MD, F.A.C.C

## 2011-01-23 NOTE — Progress Notes (Signed)
Summary: PT NEEDS PUT ON LIGHT DUTY AT WORK  Phone Note Call from Patient Call back at Home Phone [phone]   Caller: PT Reason for Call: Talk to Nurse Summary of Call: PT IS REQUESTING THAT HE BE PUT ON LIGHT DUTY FOR WORK. HE NEEDS IT ASAP. I EXPLAINED TO PT THAT DR ROTHBART WILL NOT BE BACK UNTILL 12/09/10. HE SAID IF WE COULD NOT DO ANYTHING SOON THAT HE WOULD NEED TO FIND ANOTHER DOCTOR. Initial call taken by: Faythe Ghee,  December 02, 2010 11:06 AM  Follow-up for Phone Call        I asked pt to follow up with his PCP Follow-up by: Teressa Lower RN,  December 02, 2010 5:02 PM

## 2011-01-23 NOTE — Progress Notes (Signed)
**Note De-Identified Lakendrick Paradis Obfuscation** Summary: ECHO?  Phone Note Outgoing Call   Call placed by: Larita Fife Kidus Delman LPN,  January 03, 2011 2:44 PM Reason for Call: Confirm/change Appt Summary of Call: Pt. was to have an Echo (ordered by K. Lawrence, NP on last OV) before his f/u with Dr. Dietrich Pates on 01-07-11 but no showed and did not reschedule. [name] advised that pt. needs to call office to rescheduled Echo and f/u appt. with Dr. Dietrich Pates.  Initial call taken by: Larita Fife Jeri Rawlins LPN,  January 03, 2011 2:50 PM

## 2011-01-23 NOTE — Miscellaneous (Signed)
Summary: Echo order  Clinical Lists Changes  Medications: Changed medication from CRESTOR 40 MG TABS (ROSUVASTATIN CALCIUM) Take one tablet by mouth daily. to CRESTOR 40 MG TABS (ROSUVASTATIN CALCIUM) Take one tablet by mouth daily.

## 2011-01-23 NOTE — Miscellaneous (Signed)
Summary: echo 09/13/2010  Clinical Lists Changes  Observations: Added new observation of ECHOINTERP:   Study Conclusions    - Left ventricle: The cavity size was normal. Wall thickness was     increased in a pattern of mild LVH. Systolic function was severely     reduced. The estimated ejection fraction was in the range of 25%     to 30% in the setting of atrial fibrillation. Diffuse hypokinesis.     There is akinesis of the basal-mid inferoseptal myocardium. There     is akinesis of the distal anteroseptal and apical myocardium. The     study is not technically sufficient to allow evaluation of LV     diastolic function.   - Ventricular septum: Septal motion showed paradox.   - Mitral valve: Calcified annulus. Mild regurgitation.   - Left atrium: The atrium was mildly dilated.   - Tricuspid valve: Trivial regurgitation.   - Pericardium, extracardiac: There was no pericardial effusion.    -------------------------------------------------------------------- (09/13/2010 9:22)      Echocardiogram  Procedure date:  09/13/2010  Findings:        Study Conclusions    - Left ventricle: The cavity size was normal. Wall thickness was     increased in a pattern of mild LVH. Systolic function was severely     reduced. The estimated ejection fraction was in the range of 25%     to 30% in the setting of atrial fibrillation. Diffuse hypokinesis.     There is akinesis of the basal-mid inferoseptal myocardium. There     is akinesis of the distal anteroseptal and apical myocardium. The     study is not technically sufficient to allow evaluation of LV     diastolic function.   - Ventricular septum: Septal motion showed paradox.   - Mitral valve: Calcified annulus. Mild regurgitation.   - Left atrium: The atrium was mildly dilated.   - Tricuspid valve: Trivial regurgitation.   - Pericardium, extracardiac: There was no pericardial effusion.   --------------------------------------------------------------------

## 2011-01-29 NOTE — Miscellaneous (Signed)
Summary: echo 01/08/2011  Clinical Lists Changes  Observations: Added new observation of ECHOINTERP:  Study Conclusions    - Left ventricle: septal and apical akinesis The cavity size was     moderately dilated. Wall thickness was increased in a pattern of     mild LVH. Systolic function was moderately to severely reduced.     The estimated ejection fraction was in the range of 30% to 35%.   - Mitral valve: Calcified annulus. Mild regurgitation.   - Left atrium: The atrium was moderately dilated.   - Atrial septum: No defect or patent foramen ovale was identified.  (01/08/2011 16:53)      Echocardiogram  Procedure date:  01/08/2011  Findings:       Study Conclusions    - Left ventricle: septal and apical akinesis The cavity size was     moderately dilated. Wall thickness was increased in a pattern of     mild LVH. Systolic function was moderately to severely reduced.     The estimated ejection fraction was in the range of 30% to 35%.   - Mitral valve: Calcified annulus. Mild regurgitation.   - Left atrium: The atrium was moderately dilated.   - Atrial septum: No defect or patent foramen ovale was identified.

## 2011-01-29 NOTE — Assessment & Plan Note (Signed)
Summary: F3M HAS ECHO [facility] 01/08/11/TMJ   Visit Type:  Follow-up Primary Provider:  Dr.Angus [name]   History of Present Illness: [name] returns to the office for continued assessment and treatment of coronary artery disease, peripheral vascular disease, a history of atrial arrhythmias and multiple cardiovascular risk factors.  Since his last visit, he has done fairly well.  Exercise tolerance is limited both by exertional dyspnea and fatigue as well as claudication with 2 flights of stairs.  He is substantially improved with respect to exercise tolerance and dyspnea since diuretic was added to his medical regime at his last visit.  He denies orthopnea, PND, syncope or pedal edema.  Cardiac Cath  Procedure date:  08/19/2010  Findings:      History:  [name] is 65 years old and had bypass surgery in   1995.  In May 2009, he had stenting of an ostial lesion in the LIMA   graft with a Promus stent and stenting of an ostial lesion in the   saphenous vein graft to the right coronary artery with a Cypher stent. He came back in February 2011 with restenosis in both stents.  The LIMA in-stent restenosis was treated with cutting balloon angioplasty and the  stenosis in the vein graft to the right groin was treated with a new  Promus stent.   RESULTS: LMCA: free of significant disease.     NWG:NFAOZHYQMV occluded after a small diagonal branch.      Circumflex artery: completely occluded after a small M1.     RCA:  completely occluded at its  midportion.  There were collaterals to the distal circumflex.     The LIMA graft to the LAD was patent.  There was 30% narrowing at the ostium of the LAD at the proximal edge of the stent.  The LAD itself was diffusely diseased with 70-80% diffuse disease distally.  The LAD filled the posterior descending branch by collaterals.     SVG to the RCA was completely occluded at its origin at the stent site. Second graft to the circumflex system was  completely   occluded by prior study.   LV:  The left ventriculogram showed hypokinesis of the mid inferior wall and akinesis of the anterolateral wall.  The estimated ejection fraction was 35-40%.      The aortic pressure was 126/62 with a mean of 84.  The left ventricular pressure was 126/28.   The patient has an occluded the SVG to the RCA + stent thrombosis., whichdoes not appear favorable for revascularization percutaneously or surgically.  I would recommend continued medical therapy.  He does not have good target for surgical  intervention.  Unfortunately, he continues to smoke.   Bruce Elvera Lennox Juanda Chance, MD, [facility]    Current Medications (verified): 1)  Metformin Hcl 1000 Mg Tabs (Metformin Hcl) .... Take 1 Tablet By Mouth Two Times A Day 2)  Nitroglycerin 0.4 Mg/hr Pt24 (Nitroglycerin) .... Take As Directed For Chest Pain 3)  Crestor 40 Mg Tabs (Rosuvastatin Calcium) .... Take One Tablet By Mouth Daily. 4)  Aspir-Trin 325 Mg Tbec (Aspirin) .... Take 1 Tab Daily 5)  Lisinopril 20 Mg Tabs (Lisinopril) .... Take 1 Tablet By Mouth Once A Day 6)  Pradaxa 150 Mg Caps (Dabigatran Etexilate Mesylate) .... Take 1 Tab Two Times A Day 7)  Carvedilol 12.5 Mg Tabs (Carvedilol) .... Take 1 Tab Two Times A Day 8)  Hydrocodone-Acetaminophen 5-500 Mg Tabs (Hydrocodone-Acetaminophen) .... Use As Needed For Pain 9)  Furosemide 40 Mg Tabs (Furosemide) .... Take 1 Tab Daily 10)  Klor-Con 10 10 Meq Cr-Tabs (Potassium Chloride) .... Take 1 Tab Daily 11)  Digoxin 0.125 Mg Tabs (Digoxin) .... Take 1 Tab Daily  Allergies (verified): No Known Drug Allergies  Comments:  Nurse/Medical Assistant: patient didnt bring meds or list advised i will call [place] pharmacy for med list patient was told next visit would be rescheduled if he didnt bring meds patient called wife i spoke with her over the phone and she called meds out and spelled them and doses  Past History:  Past Surgical History: Last updated:  05/06/2010 Cholecystectomy-1995 Lumbosacral discectomy and fusion x2-L4 and L5 Aorto-bifemoral bypass in 1995  Family History: Last updated: 2010/03/07 Father:deceased of myocardial infarction  Social History: Last updated: 05/06/2010 Employment-[employer] Married  Tobacco Use - 40-50 pack years; currently one pack per day 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  Past Medical History: ASCVD: Anterior MI in 4/95; PTCA of 90% proximal and distal LAD unsuccessful-->  Urgent CABG-4/95  Totally obstructed circumflex; 50% RCA; anterior hypokinesis; EF of 50-55%; 08/2008: 3-V disease plus a    95% LIMA stenosis, treated with DES; occlusion of SVG to CX; RCA SVG stenosis--> PCI with DES   2010: in-stent restenosis in the LIMA-->cutting balloon; EF of 35-40%;  07/2010: Total occlusion of SVG to    RCA-medical therapy advised; EF of 30-35% by echo in 12/2010 ______________________________________________ Paroxysmal atrial arrhythmias-1995, 2009; bradycardia with beta blocker therapy Tobacco abuse: 40-50 pack years; currently one pack per day Peripheral vascular disease-abdominal aortic obstruction-1995-->vascular surgery Hyperlipidemia AODM DDD-discectomy and fusion at L4 and L5 Excessive alcohol use- quit in 1999  Review of Systems       See history of present illness  Vital Signs:  Patient profile:   65 year old male Weight:      181 pounds BMI:     26.83 Pulse rate:   95 / minute BP sitting:   101 / 63  (left arm)  Vitals Entered By: Vincent Saa, CNA (January 21, 2011 2:25 PM)  Physical Exam  General:  Proportionate height and weight; well developed; no acute distress:   Neck-No JVD; no carotid bruits: Lungs-No tachypnea, no rales; no rhonchi; no wheezes: Cardiovascular-normal PMI; normal S1 and S2; modest systolic ejection murmur Abdomen-BS normal; soft and  non-tender without masses or organomegaly:  Musculoskeletal-No deformities, no cyanosis or clubbing: Neurologic-Normal cranial nerves; symmetric strength and tone:  Skin-Warm, no significant lesions: Extremities-1+ distal pulses except for absent left dorsalis pedis tissue; no edema:     Impression & Recommendations:  Problem # 1:  ISCHEMIC CARDIOMYOPATHY-CABG (ICD-414.8) [name] has moderately impaired left ventricular systolic function (EF of 30-35%) now associated with clinical congestive heart failure.  At present, he appears well compensated with the addition of furosemide.  We will plan to further optimize medical therapy, most likely including the addition of Aldactone to his regime.  Risk factor control will be optimized.  Problem # 2:  HYPERLIPIDEMIA (ICD-272.4) Lipid profile was good 6 months ago; current high intensity statin therapy will be continued.  CHOL: 140 (07/08/2010)   LDL: 60 (07/08/2010)   HDL: 31 (07/08/2010)   TG: 245 (07/08/2010)  Problem # 3:  PAROXYSMAL ATRIAL FIBRILLATION/FLUTTER (ICD-427.31) Patient has no symptoms to suggest recurrent arrhythmia.  Since he developed arrhythmia in the setting of an acute ischemic  event, we may wish to perform event recording and to consider discontinuation of anticoagulation if no arrhythmias are identified.  Problem # 4:  PERIPHERAL VASCULAR DISEASE (ICD-443.9) [name] has known peripheral vascular disease and claudication.  He finds his exercise tolerance to be acceptable at this point and does not wish to proceed with further evaluation.  Problem # 5:  DIABETES MELLITUS, TYPE II (ICD-250.00) Diabetic control has been suboptimal, both by hemoglobin A1c levels and by fasting blood glucose values obtained by the patient at irregular intervals at home.  I've asked him to schedule an appointment with Dr. [name] for readjustment of his therapy.  Labs Reviewed: Creat: 0.93 (08/07/2010)    Reviewed HgBA1c results: 9.5  (06/04/2009)  Other Orders: T-Comprehensive Metabolic Panel ([id number]) T-Hgb A1C ([id number])  Patient Instructions: 1)  Your physician recommends that you schedule a follow-up appointment in: 6 months 2)  Your physician recommends that you return for lab work in: today 3)  Your physician discussed the hazards of tobacco use.  Tobacco use cessation is recommended and techniques and options to help you quit were discussed. 4)  Your physician has recommended you make the following change in your medication: increase metformin to 1000mg  two times a day 5)  APPT WITH DR [name] TO INCREASE PRESCRIPTION FOR DIABETES Prescriptions: METFORMIN HCL 1000 MG TABS (METFORMIN HCL) Take 1 tablet by mouth two times a day  #60 x 3   Entered by:   Teressa Lower RN   Authorized by:   Kathlen Brunswick, MD, [facility]   Signed by:   Teressa Lower RN on 01/21/2011   Method used:   Electronically to        [facility] (retail)       [address]       [place], [place]  [address]       Ph: [phone] or [phone]       Fax: [phone]   RxID:   [id number]

## 2011-01-29 NOTE — Letter (Signed)
Summary: FMLA  FMLA   Imported By: Faythe Ghee 01/21/2011 15:38:57  _____________________________________________________________________  External Attachment:    Type:   Image     Comment:   External Document

## 2011-01-29 NOTE — Miscellaneous (Signed)
Summary: LABS BMP 08/07/2010  Clinical Lists Changes  Observations: Added new observation of CALCIUM: 9.3 mg/dL (16/09/9603 54:09) Added new observation of CREATININE: 0.93 mg/dL (81/19/1478 29:56) Added new observation of BUN: 10 mg/dL (21/30/8657 84:69) Added new observation of BG RANDOM: 153 mg/dL (62/95/2841 32:44) Added new observation of CO2 PLSM/SER: 25 meq/L (08/07/2010 16:55) Added new observation of CL SERUM: 102 meq/L (08/07/2010 16:55) Added new observation of K SERUM: 4.7 meq/L (08/07/2010 16:55) Added new observation of NA: 135 meq/L (08/07/2010 16:55)

## 2011-02-12 ENCOUNTER — Observation Stay (HOSPITAL_COMMUNITY)
Admission: EM | Admit: 2011-02-12 | Discharge: 2011-02-14 | DRG: 132 | Disposition: A | Discharge status: Home / Self Care | Payer: BC Managed Care – PPO | Attending: Cardiology | Admitting: Cardiology

## 2011-02-12 ENCOUNTER — Emergency Department (HOSPITAL_COMMUNITY): Payer: BC Managed Care – PPO

## 2011-02-12 DIAGNOSIS — Y840 Cardiac catheterization as the cause of abnormal reaction of the patient, or of later complication, without mention of misadventure at the time of the procedure: Secondary | ICD-10-CM | POA: Diagnosis present

## 2011-02-12 DIAGNOSIS — I209 Angina pectoris, unspecified: Secondary | ICD-10-CM | POA: Diagnosis present

## 2011-02-12 DIAGNOSIS — I10 Essential (primary) hypertension: Secondary | ICD-10-CM | POA: Diagnosis present

## 2011-02-12 DIAGNOSIS — T82897A Other specified complication of cardiac prosthetic devices, implants and grafts, initial encounter: Secondary | ICD-10-CM | POA: Diagnosis present

## 2011-02-12 DIAGNOSIS — I4891 Unspecified atrial fibrillation: Secondary | ICD-10-CM | POA: Diagnosis present

## 2011-02-12 DIAGNOSIS — I25810 Atherosclerosis of coronary artery bypass graft(s) without angina pectoris: Principal | ICD-10-CM | POA: Diagnosis present

## 2011-02-12 DIAGNOSIS — J209 Acute bronchitis, unspecified: Secondary | ICD-10-CM | POA: Diagnosis present

## 2011-02-12 DIAGNOSIS — R079 Chest pain, unspecified: Secondary | ICD-10-CM

## 2011-02-12 DIAGNOSIS — I70209 Unspecified atherosclerosis of native arteries of extremities, unspecified extremity: Secondary | ICD-10-CM | POA: Diagnosis present

## 2011-02-12 DIAGNOSIS — IMO0001 Reserved for inherently not codable concepts without codable children: Secondary | ICD-10-CM | POA: Diagnosis present

## 2011-02-12 DIAGNOSIS — I959 Hypotension, unspecified: Secondary | ICD-10-CM | POA: Diagnosis present

## 2011-02-12 DIAGNOSIS — Z9861 Coronary angioplasty status: Secondary | ICD-10-CM

## 2011-02-12 DIAGNOSIS — Z8249 Family history of ischemic heart disease and other diseases of the circulatory system: Secondary | ICD-10-CM

## 2011-02-12 DIAGNOSIS — E785 Hyperlipidemia, unspecified: Secondary | ICD-10-CM | POA: Diagnosis present

## 2011-02-12 DIAGNOSIS — Y92009 Unspecified place in unspecified non-institutional (private) residence as the place of occurrence of the external cause: Secondary | ICD-10-CM

## 2011-02-12 DIAGNOSIS — I2589 Other forms of chronic ischemic heart disease: Secondary | ICD-10-CM | POA: Diagnosis present

## 2011-02-12 DIAGNOSIS — I252 Old myocardial infarction: Secondary | ICD-10-CM

## 2011-02-12 DIAGNOSIS — Z7982 Long term (current) use of aspirin: Secondary | ICD-10-CM

## 2011-02-12 DIAGNOSIS — F17200 Nicotine dependence, unspecified, uncomplicated: Secondary | ICD-10-CM | POA: Diagnosis present

## 2011-02-12 LAB — CBC
HCT: 43.3 % (ref 39.0–52.0)
Hemoglobin: 15.3 g/dL (ref 13.0–17.0)
MCH: 32.6 pg (ref 26.0–34.0)
MCHC: 35.3 g/dL (ref 30.0–36.0)
MCV: 92.1 fL (ref 78.0–100.0)
Platelets: 134 10*3/uL — ABNORMAL LOW (ref 150–400)
RBC: 4.7 MIL/uL (ref 4.22–5.81)
RDW: 12.9 % (ref 11.5–15.5)

## 2011-02-12 LAB — POCT CARDIAC MARKERS
CKMB, poc: 1.8 ng/mL (ref 1.0–8.0)
Myoglobin, poc: 62.5 ng/mL (ref 12–200)

## 2011-02-12 LAB — DIFFERENTIAL
Basophils Absolute: 0.1 10*3/uL (ref 0.0–0.1)
Basophils Relative: 1 % (ref 0–1)
Eosinophils Absolute: 0.2 10*3/uL (ref 0.0–0.7)
Eosinophils Relative: 2 % (ref 0–5)
Monocytes Absolute: 0.8 10*3/uL (ref 0.1–1.0)
Monocytes Relative: 8 % (ref 3–12)
Neutro Abs: 7.3 10*3/uL (ref 1.7–7.7)
Neutrophils Relative %: 74 % (ref 43–77)

## 2011-02-12 LAB — BASIC METABOLIC PANEL
CO2: 23 mEq/L (ref 19–32)
Chloride: 99 mEq/L (ref 96–112)
Creatinine, Ser: 1.08 mg/dL (ref 0.4–1.5)
GFR calc Af Amer: >60 mL/min (ref >60)
Sodium: 134 mEq/L — ABNORMAL LOW (ref 135–145)

## 2011-02-12 LAB — BRAIN NATRIURETIC PEPTIDE: Pro B Natriuretic peptide (BNP): 192 pg/mL — ABNORMAL HIGH (ref 0.0–100.0)

## 2011-02-12 LAB — GLUCOSE, CAPILLARY
Glucose-Capillary: 409 mg/dL — ABNORMAL HIGH (ref 70–99)
Glucose-Capillary: 320 mg/dL — ABNORMAL HIGH (ref 70–99)

## 2011-02-12 LAB — CARDIAC PANEL(CRET KIN+CKTOT+MB+TROPI)
CK, MB: 3.2 ng/mL (ref 0.3–4.0)
Total CK: 61 U/L (ref 7–232)

## 2011-02-12 LAB — HEMOGLOBIN A1C: Hgb A1c MFr Bld: 12.9 % — ABNORMAL HIGH (ref <5.7)

## 2011-02-13 LAB — DIFFERENTIAL
Basophils Absolute: 0.1 10*3/uL (ref 0.0–0.1)
Lymphocytes Relative: 18 % (ref 12–46)
Neutro Abs: 6.6 10*3/uL (ref 1.7–7.7)

## 2011-02-13 LAB — CARDIAC PANEL(CRET KIN+CKTOT+MB+TROPI)
Relative Index: INVALID (ref 0.0–2.5)
Total CK: 63 U/L (ref 7–232)
Troponin I: 0.04 ng/mL (ref 0.00–0.06)

## 2011-02-13 LAB — GLUCOSE, CAPILLARY
Glucose-Capillary: 224 mg/dL — ABNORMAL HIGH (ref 70–99)
Glucose-Capillary: 336 mg/dL — ABNORMAL HIGH (ref 70–99)

## 2011-02-13 LAB — CBC
HCT: 38.8 % — ABNORMAL LOW (ref 39.0–52.0)
Hemoglobin: 13.4 g/dL (ref 13.0–17.0)
RDW: 12.9 % (ref 11.5–15.5)
WBC: 9.3 10*3/uL (ref 4.0–10.5)

## 2011-02-14 LAB — GLUCOSE, CAPILLARY: Glucose-Capillary: 230 mg/dL — ABNORMAL HIGH (ref 70–99)

## 2011-02-14 NOTE — H&P (Addendum)
[name], [name]               ACCOUNT NO.:  [id number]  MEDICAL RECORD NO.:  [id number]          PATIENT TYPE:  INP  LOCATION:  3735                         FACILITY:  [facility]  PHYSICIAN:  Florinda Marker, MD DATE OF BIRTH:  Apr 10, 1946  DATE OF ADMISSION:  08/17/2010 DATE OF DISCHARGE:                             HISTORY & PHYSICAL  ROOM NUMBER:  Is 3735, bed 2.  PRIMARY CARDIOLOGIST:  Dr. Okolona Bing with [facility].  HISTORY AND PHYSICAL:  [name] is a 65 year old male with a complex cardiovascular history including CAD with a 3-vessel CABG in 1995 (LIMA to LAD, SVG to RCA, and SVG to OM, which was found to be occluded) who is status post multiple balloon angioplasties and PTCAs to his LIMA and bypass grafts, ischemic cardiomyopathy with a known EF of 35-40%, hypertension, hyperlipidemia, diabetes, peripheral vascular disease status post fem-pop bypass in 1995, and COPD transferred from [facility] August 17, 2010 with chest pain.  The patient of note was last hospitalized here in February 2004 for chest pain.  The patient underwent a catheterization and was found to have a critical stenosis of his ostial LIMA to LAD and significant stenosis of SVG to RCA.  He received a balloon angioplasty to his osteal LIMA and drug-eluting stents to in-stent restenosis to an area of in-stent restenosis in the SVG to RCA.  His SVG to OM is known to be occluded.  The patient reports that since his catheterization his chest pain has improved significantly, at baseline he gets it about 2-3 times a week.  The patient awakened this morning at around 1 a.m. with chest pain that was somewhat different from his anginal equivalent.  He developed substernal chest pressure with bilateral arm pressure.  He took sublingual nitroglycerin x3 over a 2-1/2-hour period without resolution.  His pain is very different in that he gets prompt resolution within 2 minutes with his sublingual  nitroglycerin.  He presented to [facility] at 3:30 a.m. and was found to be in atrial fibrillation with RVR with rates in the 120-130s.  The patient was given multiple doses of IV diltiazem and started on a diltiazem drip at 10 mg per hour.  He was also bolused with heparin 5000 and placed on 1000 unit per hour heparin drip.  Full-dose aspirin was also administered.  The patient has had chest pain resolution with these measures.  He was subsequently transferred to [place].  En route, the patient was noted to be in normal sinus rhythm.  With chest pain, the patient had some associated mild shortness of breath but he denied diaphoresis, nausea or vomiting.  No palpitations. No sense of heart racing.  No syncope or presyncope.  The patient sleeps on 2 pillows without orthopnea.  No lower extremity edema.  The patient at [facility] was found to have a white count of 24.4 with a neutrophil count at 75% and within the normal range.  He denies any fevers, any productive cough, any changes in his urinary habits.  No dysuria or hesitancy, no diarrhea or abdominal pain.  No recent steroid use.  Of note,  his last white count here at [facility] was 7.6 in November.  Other review of systems the patient denies.  The patient has regular bowel movements.  No urinary frequency.  No heat, cold intolerance.  PAST MEDICAL HISTORY: 1. Coronary artery disease status post three-vessel CABG in 1995 with     LIMA to LAD, SVG to RCA and SVG to circumflex.  The SVG to     circumflex is known to be occluded.  Status post successful PTCA to     the ostium of a previously placed LIMA stent in 2001 status post     stent placement, drug-eluting stent placement to the to the     proximal body of the SVG to the PDA last February 2011.  Status     post drug eluding stent placement to the LIMA and SVG to RCA in May     2009. 2. The patient had a 2-day admission June 2011 for atypical chest     pain.  He underwent a  stress echo that was negative by stress     imaging and EKG for evidence of ischemia.  There is evidence of     chest pain during distress. 3. Ischemic cardiomyopathy, known EF of 35-40% last 2-D echo with a     prior stress echo in June 2011 where his resting EF was 40-45%. 4. Peripheral vascular disease status post femoral popliteal bypass in     1995. 5. Non-insulin dependent diabetes. 6. Hypertension. 7. Hyperlipidemia. 8. Ongoing tobacco use. 9. History of medication noncompliance. 10.Paroxysmal atrial fibrillation new onset.  PHYSICAL EXAM:  VITAL SIGNS:  Temperature is 97.7, pulse 69, blood pressure 110/67, respirations 20, 97% on 3 meters. GENERAL:  The patient is in no acute distress. NECK:  The JVP is 6 cm water.  No HJR and no carotid bruits. CARDIOVASCULAR:  Regular rate and rhythm.  No murmurs, rubs or gallops. Normal S1, S2. PULMONARY:  Basilar crackles. ABDOMEN:  Soft, nontender, nondistended.  No abdominal bruits.  Femoral pulses, 2+ femoral pulses with left right greater than left bilateral release, 2+ popliteal pulses.  No bruits.  1+ DP/PT pulses on the left 2+.  DP/PT pulse on the right.  HOME MEDICATIONS: 1. Clopidogrel 75 mg daily. 2. Lisinopril 20 mg daily. 3. Metformin 520 mg b.i.d. 4. Crestor 40 mg daily. 5. Aspirin 81 mg daily.  OUTSIDE HOSPITAL LABORATORY DATA:  August 17, 2010, sodium 134, potassium 4.0, BUN 21, glucose 281, BUN 10, creatinine 0.6.  Estimated GFR greater than 60.  White count 24.4, 75% neutrophils, 16% lymphocytes, 2% eosinophils, 6% monocytes.  Hematocrit is 47, hemoglobin of 16.7 and platelets 185,000.  PT 12.8, INR is 0.94, PTT 27.  Troponin I 0.05, CK-MB 1.9, myoglobin 80.  UA greater than 1000 mg/dL glucose. Total protein 100 mg/dL, negative nitrate, negative leukocyte esterase. The EKG here shows low voltage in the limb leads, normal sinus rhythm with first-degree AV block, PR interval 270 milliseconds.  Old anterior infarct  and Q-waves in V1-V3, downsloping ST segments in I and aVL with some T-wave inversion in V5 and V6 that is unchanged from prior.  ASSESSMENT AND PLAN:  Mr. Saladin is a complicated 65 year old male with a complex history of coronary artery disease status post multiple percutaneous interventions to his bypass grafts and left internal mammary artery grafts, hypertension, diabetes, and peripheral vascular disease who presented to an outside hospital with chest pain found to have atrial fibrillation with rapid ventricular response.  The patient has  a very high CHADS score of 3 and is on aspirin and Plavix for his coronary artery disease.  He is at high risk of bleeding but his risks likely outweigh the benefits.  The patient is currently on a diltiazem drip and has spontaneously converted.  Will transition him to p.o. diltiazem and monitor for bradycardia given his significant atrioventricular block.  PLAN: 1. Paroxysmal atrial fibrillation.  Continue heparin therapy.  Will     defer anticoagulation to primary team with Coumadin versus PRADAXA     considerations.  Again, the patient has a CHADS score of 3.  Will     continue rate control with p.o. diltiazem starting at 30 mg q.6     hours.  Will monitor for evidence of bradycardia.  Will check     thyroid function. 2. Coronary artery disease.  The patient had associated chest pressure     with his atrial fibrillation with rapid ventricular response likely     representing demand ischemia.  Will continue to monitor his     biomarkers and treat with his anti-ischemic medications. 3. Leukocytosis.  White count of 24.4.  No localizing symptoms to     suggest infection.  Will order a UA and urine cultures.  Chest x-     ray and blood cultures, also repeating CBC here and will continue     to follow. 4. Diabetes.  Will place the patient on sliding scale insulin here.     The patient's blood sugars were elevated in the 280s demonstrating      that metformin 500 b.i.d. is likely not achieving the best control.     Will monitor him in-house and adjust his therapy accordingly. 5. Hypertension.  Will continue lisinopril and the patient is on     diltiazem. 6. Hyperlipidemia.  Lipids are pending.  Continue Crestor.     Florinda Marker, MD    MLA/MEDQ  D:  08/17/2010  T:  08/17/2010  Job:  [id number]  Electronically Signed by Docia Furl MD on 02/14/2011 11:28:52 AM

## 2011-02-17 NOTE — Discharge Summary (Addendum)
NAMECARMELLO, [name]               ACCOUNT NO.:  [id number]  MEDICAL RECORD NO.:  [id number]           PATIENT TYPE:  I  LOCATION:  2009                         FACILITY:  [facility]  PHYSICIAN:  Noralyn Pick. Eden Emms, MD, FACCDATE OF BIRTH:  October 19, 1946  DATE OF ADMISSION:  02/12/2011 DATE OF DISCHARGE:  02/14/2011                              DISCHARGE SUMMARY   DISCHARGE DIAGNOSES: 1. Chest pain, negative for myocardial infarction at this admission. 2. Coronary artery disease status post coronary artery bypass graft in     1995 with repeat catheterization on August 2011 showing patent left     internal mammary artery to the left anterior descending, diffusely     diseased distal left anterior descending, occluded vein graft to     the circumflex which was old, occluded vein graft to the right     coronary artery at the site of the previously placed stent (stent     thrombosis), managed medically at that time. 3. Recurrent anginal pain and non-ST segment elevation myocardial     infarction with multiple hospitalizations in the past. 4. Bronchitis, to complete 5-day course of azithromycin. 5. Hypotension, Lasix and lisinopril decreased this admission. 6. Ischemic cardiomyopathy with an EF of 30%-35% by echo, January     2012, with septal apical akinesis. 7. Paroxysmal atrial fibrillation, on Pradaxa. 8. Hyperlipidemia. 9. Uncontrolled diabetes mellitus with a hemoglobin A1c of 12.9.     February 12, 2011, for followup with primary care provider. 10.Peripheral vascular disease, status post femoral-popliteal bypass     graft in 1995. 11.Ongoing tobacco abuse. 12.History of medication noncompliance in the past.  HOSPITAL COURSE:  [name] is a 65 year old gentleman with a prior history of CAD, PVD, diabetes, hypertension, hyperlipidemia, and paroxysmal AFib, on Pradaxa, who presented to [facility] with complaints of chest discomfort.  The patient described the chest pain like a  substernal-like deep pain, less severe than his usual anginal pain.  Sublingual nitro did not relieve his chest pain completely.  He had been doing well since his discharge, September 2011.  He also complained of runny nose, dry cough with flu-like symptoms for approximately a week.  First set of point-of-care markers were negative. BNP was mildly elevated at 192.  His blood pressure remained on the low side at this admission.  Chest x-ray suggested a question of bronchitis and he was started on azithromycin.  He was admitted to the hospital for observation and cycling of his cardiac enzymes, which remained negative x3.  Unfortunately, his blood pressure prevented, titration of the medication further, and his medications were actually cut down in regards to a decreased dose of Lasix and lisinopril by Dr. Eden Emms today. The patient remained stable with out further chest pain or shortness of breath.  There is some thought that Ranexa could be considered, but Dr. Eden Emms wants the patient to follow up with Dr. Dietrich [name].  Dr. Eden Emms has seen and examined the patient today and feels he is stable for discharge.  DISCHARGE LABS:  WBC 9.3, hemoglobin 13.4, hematocrit 38.8, platelet count 132.  Sodium 134, potassium 4.2, chloride 99, CO2  23, glucose 213, BUN 16, creatinine 1.08.  A1c was 12.9.  Cardiac enzymes negative x5. BNP 192.  STUDIES:  Chest x-ray, February 12, 2011, showed no pneumonia.  Question of bronchitis.  DISCHARGE MEDICATIONS: 1. Azithromycin 250 mg daily, discharged with prescription for 1     tablet to complete 5-day course. 2. Lasix 20 mg daily. 3. Lisinopril 10 mg daily. 4. Aspirin 325 mg daily. 5. Carvedilol 12.5 mg b.i.d. with meals. 6. Dabigatran 150 mg every 12 hours. 7. Digoxin 0.125 mg daily. 8. Metformin 1000 mg b.i.d. 9. Nitroglycerin sublingual 0.4 mg every 5 minutes as needed up to 3     doses for chest pain. 10.Potassium chloride 10 mEq daily. 11.Crestor 40  mg nightly. 12.Vicodin 5/500 mg every 4 hours as needed for pain.  DISPOSITION:  [name] will be discharged in stable condition to home. He is instructed to increase his activity slowly and may walk up steps and may shower and bathe.  He is to follow a low-salt heart-healthy diabetic diet.  He will follow up with Dr. Dietrich [name] on February 28, 2011 at 1 p.m.  He is also instructed to follow up with his primary care provider for his uncontrolled diabetes within the next week.  DURATION OF DISCHARGE ENCOUNTER:  Greater than 30 minutes including physician and PA time.     Jeri Jeanbaptiste, P.A.C.   ______________________________ Noralyn Pick Eden Emms, MD, [facility]    DD/MEDQ  D:  02/14/2011  T:  02/14/2011  Job:  [id number]  cc:   Vincent G. [name], MD Vincent Friends. Dietrich Pates, MD, [facility]  Electronically Signed by Charlton Haws MD [facility] on 02/17/2011 05:00:19 PM Electronically Signed by Ronie Spies  on 02/19/2011 12:58:46 PM

## 2011-02-28 ENCOUNTER — Encounter: Payer: Self-pay | Admitting: Adult Health

## 2011-02-28 ENCOUNTER — Encounter (INDEPENDENT_AMBULATORY_CARE_PROVIDER_SITE_OTHER): Payer: BC Managed Care – PPO | Admitting: Adult Health

## 2011-02-28 DIAGNOSIS — I4891 Unspecified atrial fibrillation: Secondary | ICD-10-CM

## 2011-02-28 DIAGNOSIS — E785 Hyperlipidemia, unspecified: Secondary | ICD-10-CM

## 2011-02-28 DIAGNOSIS — I2589 Other forms of chronic ischemic heart disease: Secondary | ICD-10-CM

## 2011-03-04 NOTE — Assessment & Plan Note (Signed)
Summary: post hosp [facility] per Dayna/tg   Visit Type:  Follow-up Primary Provider:  Dr.Angus [name]  CC:  no cardiology complaints.  History of Present Illness: [name] is a 65 y/o CM we are following for known history CAD s/p CABG (1995), LIMA -LAD,SVG to RCA, SVG to Cx, stent to RCA, stent to LIMA, stent to SVG to RCA,  ICM 30-35% (1/12);  PAF, PVD (Fem-Pop 1995) who was recently discharge from [facility] with diagnosis of recurrent chest pain.  He was not found to be having ischemic chest discomfort and was treated for bronchitis. He was mildly hypotensive on admission and lasix dose was adjusted down to 20mg  daily from 40mg  daily.    Since discharge he has been doing well.  He has quit smoking and remains medically compliant.  He has gained 2 lbs since last office visit, do not know wt on discharge from [facility].  He states his breathig status is better and he has been without complaint.  Current Medications (verified): 1)  Metformin Hcl 1000 Mg Tabs (Metformin Hcl) .... Take 1 Tablet By Mouth Two Times A Day 2)  Nitroglycerin 0.4 Mg/hr Pt24 (Nitroglycerin) .... Take As Directed For Chest Pain 3)  Crestor 40 Mg Tabs (Rosuvastatin Calcium) .... Take One Tablet By Mouth Daily. 4)  Aspir-Trin 325 Mg Tbec (Aspirin) .... Take 1 Tab Daily 5)  Lisinopril 10 Mg Tabs (Lisinopril) .... Take 1 Tab Daily 6)  Pradaxa 150 Mg Caps (Dabigatran Etexilate Mesylate) .... Take 1 Tab Two Times A Day 7)  Carvedilol 12.5 Mg Tabs (Carvedilol) .... Take 1 Tab Two Times A Day 8)  Hydrocodone-Acetaminophen 5-500 Mg Tabs (Hydrocodone-Acetaminophen) .... Use As Needed For Pain 9)  Furosemide 20 Mg Tabs (Furosemide) .... Take 1 Tab Daily, Take An Additional Tablet Daily As Needed For 3-5lbs Wt Gain in 1 Day 10)  Klor-Con 10 10 Meq Cr-Tabs (Potassium Chloride) .... Take 1 Tab Daily 11)  Digoxin 0.125 Mg Tabs (Digoxin) .... Take 1 Tab Daily  Allergies (verified): No Known Drug Allergies  Comments:  Nurse/Medical  Assistant: patient brought med list we reviewed pharmacy [facility]  Past History:  Past medical, surgical, family and social histories (including risk factors) reviewed, and no changes noted (except as noted below).  Past Medical History: Reviewed history from 01/21/2011 and no changes required. ASCVD: Anterior MI in 4/95; PTCA of 90% proximal and distal LAD unsuccessful-->  Urgent CABG-4/95  Totally obstructed circumflex; 50% RCA; anterior hypokinesis; EF of 50-55%; 08/2008: 3-V disease plus a    95% LIMA stenosis, treated with DES; occlusion of SVG to CX; RCA SVG stenosis--> PCI with DES   2010: in-stent restenosis in the LIMA-->cutting balloon; EF of 35-40%;  07/2010: Total occlusion of SVG to    RCA-medical therapy advised; EF of 30-35% by echo in 12/2010 ______________________________________________ Paroxysmal atrial arrhythmias-1995, 2009; bradycardia with beta blocker therapy Tobacco abuse: 40-50 pack years; currently one pack per day Peripheral vascular disease-abdominal aortic obstruction-1995-->vascular surgery Hyperlipidemia AODM DDD-discectomy and fusion at L4 and L5 Excessive alcohol use- quit in 1999  Past Surgical History: Reviewed history from 05/06/2010 and no changes required. Cholecystectomy-1995 Lumbosacral discectomy and fusion x2-L4 and L5 Aorto-bifemoral bypass in 1995  Family History: Reviewed history from 02/08/2010 and no changes required. Father:deceased of myocardial infarction  Social History: Reviewed history from 05/06/2010 and no changes required. Employment-[employer] Married  Tobacco Use - 40-50 pack years; currently one pack per day Alcohol Use - history of abuse discontinued in 1999 Regular  Exercise - no Drug Use - no(quit 1999)  Review of Systems       All other systems have been reviewed and are negative unless stated above.   Vital Signs:  Patient profile:   65 year old male Weight:      183 pounds BMI:      27.12 Pulse rate:   95 / minute BP sitting:   109 / 66  (left arm)  Vitals Entered By: Dreama Saa, CNA (February 28, 2011 1:09 PM)  Physical Exam  General:  Well developed, well nourished, in no acute distress. Mouth:  poor dentition.   Lungs:  Clear bilaterally to auscultation and percussion. Heart:  Distant with RRR.  1/6 systolic murmur.  No JVD or carotid bruits.  Pulses are palpable. Abdomen:  Bowel sounds positive; abdomen soft and non-tender without masses, organomegaly, or hernias noted. No hepatosplenomegaly. Msk:  Back normal, normal gait. Muscle strength and tone normal. Pulses:  pulses normal in all 4 extremities Extremities:  No clubbing or cyanosis. Neurologic:  Alert and oriented x 3. Psych:  Normal affect.   EKG  Procedure date:  02/28/2011  Findings:      Atrial fibrillation with a controlled ventricular response rate of: 86 bpm  Impression & Recommendations:  Problem # 1:  ISCHEMIC CARDIOMYOPATHY-CABG (ICD-414.8) I have gone over his medications and need to weigh himself daily on a digital scale.  He is to record his weight each day and take an additional lasix should he gain 2-3 lbs in 1-2 days, or 5 lbs in one week.  I have given him refills on lasix with extra doses provided for such instances. He verbalizes understanding. He will see [place] again in 3 months unless symptomatic. His updated medication list for this problem includes:    Nitroglycerin 0.4 Mg/hr Pt24 (Nitroglycerin) .Marland Kitchen... Take as directed for chest pain    Aspir-trin 325 Mg Tbec (Aspirin) .Marland Kitchen... Take 1 tab daily    Lisinopril 10 Mg Tabs (Lisinopril) .Marland Kitchen... Take 1 tab daily    Carvedilol 12.5 Mg Tabs (Carvedilol) .Marland Kitchen... Take 1 tab two times a day    Furosemide 20 Mg Tabs (Furosemide) .Marland Kitchen... Take 1 tab daily, take an additional tablet daily as needed for 3-5lbs wt gain in 1 day    Digoxin 0.125 Mg Tabs (Digoxin) .Marland Kitchen... Take 1 tab daily  Problem # 2:  PAROXYSMAL ATRIAL FIBRILLATION/FLUTTER  (ICD-427.31) He will continue on Pradaxa as directed along with dig and carvedilol.  Labs have been reviewed from recent hospitalization. His updated medication list for this problem includes:    Aspir-trin 325 Mg Tbec (Aspirin) .Marland Kitchen... Take 1 tab daily    Carvedilol 12.5 Mg Tabs (Carvedilol) .Marland Kitchen... Take 1 tab two times a day    Digoxin 0.125 Mg Tabs (Digoxin) .Marland Kitchen... Take 1 tab daily  Patient Instructions: 1)  Your physician recommends that you schedule a follow-up appointment in: 3 months 2)  Your physician has recommended you make the following change in your medication: lasix 20mg  daily take an additional tablet by mouth daily for 3-5lbs wt gain 3)  Your physician recommends that you weigh, daily, at the same time every day, and in the same amount of clothing.  Please record your daily weights on the handout provided and bring it to your next appointment. 4)  please weigh on a digital scale Prescriptions: FUROSEMIDE 20 MG TABS (FUROSEMIDE) take 1 tab daily, take an additional tablet daily as needed for 3-5lbs wt gain in 1 day  #  60 x 6   Entered by:   Teressa Lower RN   Authorized by:   [name], NP   Signed by:   Teressa Lower RN on 02/28/2011   Method used:   Electronically to        [facility] (retail)       [address]       [place], [place]  [address]       Ph: [phone] or [phone]       Fax: [phone]   RxID:   [id number]

## 2011-03-06 LAB — CBC
HCT: 41.5 % (ref 39.0–52.0)
HCT: 47.2 % (ref 39.0–52.0)
Hemoglobin: 14.3 g/dL (ref 13.0–17.0)
MCH: 32.1 pg (ref 26.0–34.0)
MCHC: 34.5 g/dL (ref 30.0–36.0)
MCHC: 35.4 g/dL (ref 30.0–36.0)
MCV: 93.2 fL (ref 78.0–100.0)
MCV: 93.2 fL (ref 78.0–100.0)
Platelets: 144 10*3/uL — ABNORMAL LOW (ref 150–400)
RDW: 13.3 % (ref 11.5–15.5)
RDW: 13.4 % (ref 11.5–15.5)
RDW: 13.4 % (ref 11.5–15.5)
WBC: 8 10*3/uL (ref 4.0–10.5)

## 2011-03-06 LAB — GLUCOSE, CAPILLARY
Glucose-Capillary: 163 mg/dL — ABNORMAL HIGH (ref 70–99)
Glucose-Capillary: 216 mg/dL — ABNORMAL HIGH (ref 70–99)
Glucose-Capillary: 217 mg/dL — ABNORMAL HIGH (ref 70–99)
Glucose-Capillary: 218 mg/dL — ABNORMAL HIGH (ref 70–99)
Glucose-Capillary: 223 mg/dL — ABNORMAL HIGH (ref 70–99)
Glucose-Capillary: 230 mg/dL — ABNORMAL HIGH (ref 70–99)
Glucose-Capillary: 271 mg/dL — ABNORMAL HIGH (ref 70–99)

## 2011-03-06 LAB — CARDIAC PANEL(CRET KIN+CKTOT+MB+TROPI)
CK, MB: 3.3 ng/mL (ref 0.3–4.0)
CK, MB: 4 ng/mL (ref 0.3–4.0)
CK, MB: 4.3 ng/mL — ABNORMAL HIGH (ref 0.3–4.0)
Relative Index: 3.7 — ABNORMAL HIGH (ref 0.0–2.5)
Relative Index: INVALID (ref 0.0–2.5)
Total CK: 76 U/L (ref 7–232)
Troponin I: 0.07 ng/mL — ABNORMAL HIGH (ref 0.00–0.06)
Troponin I: 0.07 ng/mL — ABNORMAL HIGH (ref 0.00–0.06)
Troponin I: 0.08 ng/mL — ABNORMAL HIGH (ref 0.00–0.06)
Troponin I: 0.09 ng/mL — ABNORMAL HIGH (ref 0.00–0.06)

## 2011-03-06 LAB — DIFFERENTIAL
Basophils Absolute: 0 10*3/uL (ref 0.0–0.1)
Basophils Absolute: 0.1 10*3/uL (ref 0.0–0.1)
Basophils Absolute: 0.1 10*3/uL (ref 0.0–0.1)
Basophils Relative: 0 % (ref 0–1)
Basophils Relative: 1 % (ref 0–1)
Eosinophils Absolute: 0.2 10*3/uL (ref 0.0–0.7)
Eosinophils Absolute: 0.3 10*3/uL (ref 0.0–0.7)
Eosinophils Relative: 2 % (ref 0–5)
Eosinophils Relative: 3 % (ref 0–5)
Lymphocytes Relative: 22 % (ref 12–46)
Monocytes Absolute: 0.6 10*3/uL (ref 0.1–1.0)
Monocytes Absolute: 0.9 10*3/uL (ref 0.1–1.0)
Monocytes Relative: 7 % (ref 3–12)
Neutro Abs: 10.8 10*3/uL — ABNORMAL HIGH (ref 1.7–7.7)
Neutro Abs: 5.4 10*3/uL (ref 1.7–7.7)
Neutrophils Relative %: 67 % (ref 43–77)

## 2011-03-06 LAB — URINALYSIS, ROUTINE W REFLEX MICROSCOPIC
Bilirubin Urine: NEGATIVE
Ketones, ur: 15 mg/dL — AB
Nitrite: NEGATIVE
Urobilinogen, UA: 0.2 mg/dL (ref 0.0–1.0)
pH: 5.5 (ref 5.0–8.0)

## 2011-03-06 LAB — POCT CARDIAC MARKERS
CKMB, poc: 1.8 ng/mL (ref 1.0–8.0)
CKMB, poc: 1.9 ng/mL (ref 1.0–8.0)
Troponin i, poc: 0.05 ng/mL (ref 0.00–0.09)
Troponin i, poc: <0.05 ng/mL (ref 0.00–0.09)
Troponin i, poc: <0.05 ng/mL (ref 0.00–0.09)

## 2011-03-06 LAB — PROTIME-INR
INR: 0.94 (ref 0.00–1.49)
Prothrombin Time: 12.8 seconds (ref 11.6–15.2)

## 2011-03-06 LAB — BASIC METABOLIC PANEL
BUN: 10 mg/dL (ref 6–23)
BUN: 13 mg/dL (ref 6–23)
BUN: 13 mg/dL (ref 6–23)
CO2: 21 mEq/L (ref 19–32)
CO2: 23 mEq/L (ref 19–32)
Calcium: 8.6 mg/dL (ref 8.4–10.5)
Chloride: 103 mEq/L (ref 96–112)
Chloride: 106 mEq/L (ref 96–112)
Chloride: 106 mEq/L (ref 96–112)
Creatinine, Ser: 0.95 mg/dL (ref 0.4–1.5)
GFR calc Af Amer: >60 mL/min (ref >60)
GFR calc Af Amer: >60 mL/min (ref >60)
GFR calc non Af Amer: >60 mL/min (ref >60)
GFR calc non Af Amer: >60 mL/min (ref >60)
Glucose, Bld: 248 mg/dL — ABNORMAL HIGH (ref 70–99)
Glucose, Bld: 281 mg/dL — ABNORMAL HIGH (ref 70–99)
Potassium: 3.6 mEq/L (ref 3.5–5.1)
Potassium: 4 mEq/L (ref 3.5–5.1)
Potassium: 4.3 mEq/L (ref 3.5–5.1)

## 2011-03-06 LAB — URINE MICROSCOPIC-ADD ON

## 2011-03-06 LAB — BRAIN NATRIURETIC PEPTIDE
Pro B Natriuretic peptide (BNP): 351 pg/mL — ABNORMAL HIGH (ref 0.0–100.0)
Pro B Natriuretic peptide (BNP): 352 pg/mL — ABNORMAL HIGH (ref 0.0–100.0)

## 2011-03-07 LAB — CBC
HCT: 44.5 % (ref 39.0–52.0)
HCT: 44.9 % (ref 39.0–52.0)
Hemoglobin: 15.9 g/dL (ref 13.0–17.0)
MCH: 31.4 pg (ref 26.0–34.0)
MCH: 31.6 pg (ref 26.0–34.0)
MCHC: 35.1 g/dL (ref 30.0–36.0)
MCV: 90.5 fL (ref 78.0–100.0)
MCV: 90.8 fL (ref 78.0–100.0)
Platelets: 158 10*3/uL (ref 150–400)
Platelets: 166 10*3/uL (ref 150–400)
RBC: 4.9 MIL/uL (ref 4.22–5.81)
RDW: 13.1 % (ref 11.5–15.5)
RDW: 13.2 % (ref 11.5–15.5)
RDW: 13.2 % (ref 11.5–15.5)
WBC: 8.9 10*3/uL (ref 4.0–10.5)

## 2011-03-07 LAB — BASIC METABOLIC PANEL
BUN: 10 mg/dL (ref 6–23)
BUN: 11 mg/dL (ref 6–23)
CO2: 25 mEq/L (ref 19–32)
CO2: 26 mEq/L (ref 19–32)
Calcium: 8.9 mg/dL (ref 8.4–10.5)
Chloride: 103 mEq/L (ref 96–112)
Creatinine, Ser: 0.9 mg/dL (ref 0.4–1.5)
Creatinine, Ser: 0.95 mg/dL (ref 0.4–1.5)
GFR calc non Af Amer: >60 mL/min (ref >60)
Glucose, Bld: 241 mg/dL — ABNORMAL HIGH (ref 70–99)
Sodium: 132 mEq/L — ABNORMAL LOW (ref 135–145)

## 2011-03-07 LAB — T4, FREE: Free T4: 1.38 ng/dL (ref 0.80–1.80)

## 2011-03-07 LAB — CARDIAC PANEL(CRET KIN+CKTOT+MB+TROPI)
CK, MB: 6.6 ng/mL (ref 0.3–4.0)
CK, MB: 9 ng/mL (ref 0.3–4.0)
CK, MB: 9.7 ng/mL (ref 0.3–4.0)
Relative Index: 5.5 — ABNORMAL HIGH (ref 0.0–2.5)
Relative Index: 6.1 — ABNORMAL HIGH (ref 0.0–2.5)
Total CK: 125 U/L (ref 7–232)
Total CK: 165 U/L (ref 7–232)
Troponin I: 0.62 ng/mL (ref 0.00–0.06)
Troponin I: 2.67 ng/mL (ref 0.00–0.06)

## 2011-03-07 LAB — GLUCOSE, CAPILLARY
Glucose-Capillary: 164 mg/dL — ABNORMAL HIGH (ref 70–99)
Glucose-Capillary: 165 mg/dL — ABNORMAL HIGH (ref 70–99)
Glucose-Capillary: 166 mg/dL — ABNORMAL HIGH (ref 70–99)
Glucose-Capillary: 175 mg/dL — ABNORMAL HIGH (ref 70–99)
Glucose-Capillary: 181 mg/dL — ABNORMAL HIGH (ref 70–99)
Glucose-Capillary: 213 mg/dL — ABNORMAL HIGH (ref 70–99)
Glucose-Capillary: 236 mg/dL — ABNORMAL HIGH (ref 70–99)
Glucose-Capillary: 248 mg/dL — ABNORMAL HIGH (ref 70–99)
Glucose-Capillary: 300 mg/dL — ABNORMAL HIGH (ref 70–99)

## 2011-03-07 LAB — COMPREHENSIVE METABOLIC PANEL
ALT: 14 U/L (ref 0–53)
Calcium: 9.1 mg/dL (ref 8.4–10.5)
Creatinine, Ser: 0.88 mg/dL (ref 0.4–1.5)
Glucose, Bld: 254 mg/dL — ABNORMAL HIGH (ref 70–99)
Sodium: 135 mEq/L (ref 135–145)
Total Protein: 6.8 g/dL (ref 6.0–8.3)

## 2011-03-07 LAB — HEMOGLOBIN A1C
Hgb A1c MFr Bld: 10.2 % — ABNORMAL HIGH (ref <5.7)
Mean Plasma Glucose: 246 mg/dL — ABNORMAL HIGH (ref <117)

## 2011-03-07 LAB — HEPARIN LEVEL (UNFRACTIONATED)
Heparin Unfractionated: 0.14 IU/mL — ABNORMAL LOW (ref 0.30–0.70)
Heparin Unfractionated: <0.1 IU/mL — ABNORMAL LOW (ref 0.30–0.70)
Heparin Unfractionated: <0.1 IU/mL — ABNORMAL LOW (ref 0.30–0.70)

## 2011-03-07 LAB — BRAIN NATRIURETIC PEPTIDE: Pro B Natriuretic peptide (BNP): 451 pg/mL — ABNORMAL HIGH (ref 0.0–100.0)

## 2011-03-07 LAB — CULTURE, BLOOD (ROUTINE X 2): Culture: NO GROWTH

## 2011-03-07 LAB — LIPID PANEL: Triglycerides: 172 mg/dL — ABNORMAL HIGH (ref <150)

## 2011-03-07 LAB — TSH: TSH: 0.546 u[IU]/mL (ref 0.350–4.500)

## 2011-03-07 LAB — PROTIME-INR
INR: 0.97 (ref 0.00–1.49)
Prothrombin Time: 13.1 seconds (ref 11.6–15.2)

## 2011-03-09 LAB — BASIC METABOLIC PANEL
BUN: 12 mg/dL (ref 6–23)
CO2: 24 mEq/L (ref 19–32)
Calcium: 8.8 mg/dL (ref 8.4–10.5)
GFR calc non Af Amer: >60 mL/min (ref >60)
Glucose, Bld: 246 mg/dL — ABNORMAL HIGH (ref 70–99)
Sodium: 133 mEq/L — ABNORMAL LOW (ref 135–145)

## 2011-03-09 LAB — CARDIAC PANEL(CRET KIN+CKTOT+MB+TROPI)
Relative Index: INVALID (ref 0.0–2.5)
Total CK: 93 U/L (ref 7–232)
Troponin I: 0.02 ng/mL (ref 0.00–0.06)

## 2011-03-09 LAB — DIFFERENTIAL
Basophils Absolute: 0.1 10*3/uL (ref 0.0–0.1)
Basophils Relative: 1 % (ref 0–1)
Eosinophils Relative: 3 % (ref 0–5)
Lymphocytes Relative: 31 % (ref 12–46)
Monocytes Absolute: 0.6 10*3/uL (ref 0.1–1.0)
Neutro Abs: 4.4 10*3/uL (ref 1.7–7.7)

## 2011-03-09 LAB — POCT CARDIAC MARKERS
CKMB, poc: 2.4 ng/mL (ref 1.0–8.0)
Troponin i, poc: <0.05 ng/mL (ref 0.00–0.09)

## 2011-03-09 LAB — GLUCOSE, CAPILLARY
Glucose-Capillary: 214 mg/dL — ABNORMAL HIGH (ref 70–99)
Glucose-Capillary: 241 mg/dL — ABNORMAL HIGH (ref 70–99)

## 2011-03-09 LAB — CBC
HCT: 44.1 % (ref 39.0–52.0)
Hemoglobin: 15.2 g/dL (ref 13.0–17.0)
MCHC: 34.5 g/dL (ref 30.0–36.0)
Platelets: 172 10*3/uL (ref 150–400)
RDW: 13.2 % (ref 11.5–15.5)

## 2011-03-12 LAB — BASIC METABOLIC PANEL
BUN: 13 mg/dL (ref 6–23)
BUN: 8 mg/dL (ref 6–23)
CO2: 25 mEq/L (ref 19–32)
Calcium: 8.6 mg/dL (ref 8.4–10.5)
Chloride: 103 mEq/L (ref 96–112)
Creatinine, Ser: 0.89 mg/dL (ref 0.4–1.5)
Creatinine, Ser: 1.01 mg/dL (ref 0.4–1.5)
GFR calc non Af Amer: >60 mL/min (ref >60)
Glucose, Bld: 370 mg/dL — ABNORMAL HIGH (ref 70–99)

## 2011-03-12 LAB — CARDIAC PANEL(CRET KIN+CKTOT+MB+TROPI)
CK, MB: 3.7 ng/mL (ref 0.3–4.0)
Total CK: 96 U/L (ref 7–232)
Troponin I: 0.01 ng/mL (ref 0.00–0.06)
Troponin I: 0.02 ng/mL (ref 0.00–0.06)

## 2011-03-12 LAB — COMPREHENSIVE METABOLIC PANEL
AST: 15 U/L (ref 0–37)
Albumin: 3.5 g/dL (ref 3.5–5.2)
BUN: 9 mg/dL (ref 6–23)
Calcium: 8.5 mg/dL (ref 8.4–10.5)
Creatinine, Ser: 0.85 mg/dL (ref 0.4–1.5)
GFR calc Af Amer: >60 mL/min (ref >60)
Total Bilirubin: 0.7 mg/dL (ref 0.3–1.2)
Total Protein: 6.4 g/dL (ref 6.0–8.3)

## 2011-03-12 LAB — CBC
HCT: 48.9 % (ref 39.0–52.0)
HCT: 49.5 % (ref 39.0–52.0)
MCHC: 34.1 g/dL (ref 30.0–36.0)
MCHC: 34.8 g/dL (ref 30.0–36.0)
MCV: 93.2 fL (ref 78.0–100.0)
MCV: 94.9 fL (ref 78.0–100.0)
Platelets: 165 10*3/uL (ref 150–400)
Platelets: 149 10*3/uL — ABNORMAL LOW (ref 150–400)
Platelets: 154 10*3/uL (ref 150–400)
Platelets: 159 10*3/uL (ref 150–400)
RBC: 5.14 MIL/uL (ref 4.22–5.81)
RBC: 5.17 MIL/uL (ref 4.22–5.81)
RDW: 12.9 % (ref 11.5–15.5)
RDW: 12.4 % (ref 11.5–15.5)
WBC: 7.6 10*3/uL (ref 4.0–10.5)
WBC: 7.8 10*3/uL (ref 4.0–10.5)

## 2011-03-12 LAB — GLUCOSE, CAPILLARY
Glucose-Capillary: 154 mg/dL — ABNORMAL HIGH (ref 70–99)
Glucose-Capillary: 169 mg/dL — ABNORMAL HIGH (ref 70–99)
Glucose-Capillary: 182 mg/dL — ABNORMAL HIGH (ref 70–99)
Glucose-Capillary: 205 mg/dL — ABNORMAL HIGH (ref 70–99)
Glucose-Capillary: 216 mg/dL — ABNORMAL HIGH (ref 70–99)
Glucose-Capillary: 273 mg/dL — ABNORMAL HIGH (ref 70–99)
Glucose-Capillary: 365 mg/dL — ABNORMAL HIGH (ref 70–99)

## 2011-03-12 LAB — HEPARIN LEVEL (UNFRACTIONATED)
Heparin Unfractionated: 0.11 IU/mL — ABNORMAL LOW (ref 0.30–0.70)
Heparin Unfractionated: 0.2 IU/mL — ABNORMAL LOW (ref 0.30–0.70)
Heparin Unfractionated: 0.25 IU/mL — ABNORMAL LOW (ref 0.30–0.70)
Heparin Unfractionated: 0.63 IU/mL (ref 0.30–0.70)

## 2011-03-12 LAB — PROTIME-INR: INR: 0.96 (ref 0.00–1.49)

## 2011-03-12 LAB — LIPID PANEL
HDL: 33 mg/dL — ABNORMAL LOW (ref >39)
LDL Cholesterol: 76 mg/dL (ref 0–99)
Total CHOL/HDL Ratio: 4.8 RATIO
Triglycerides: 238 mg/dL — ABNORMAL HIGH (ref <150)
VLDL: 48 mg/dL — ABNORMAL HIGH (ref 0–40)

## 2011-03-12 LAB — POCT CARDIAC MARKERS

## 2011-04-04 NOTE — Discharge Summary (Signed)
[name], [name]               ACCOUNT NO.:  [id number]  MEDICAL RECORD NO.:  [id number]           PATIENT TYPE:  I  LOCATION:  2009                         FACILITY:  [facility]  PHYSICIAN:  Vincent Pick. Eden Emms, MD, FACCDATE OF BIRTH:  May 02, 1946  DATE OF ADMISSION:  02/12/2011 DATE OF DISCHARGE:                              DISCHARGE SUMMARY   PRIMARY CARE PHYSICIAN:  Vincent G. [name], MD  PRIMARY CARDIOLOGIST:  Vincent Friends. Dietrich Pates, MD, [facility] with [facility] in [place].  CHIEF COMPLAINT:  Chest pain.  HISTORY OF PRESENTING ILLNESS:  This is a 65 year old gentleman with extensive cardiac history of coronary artery disease, recurrent admission for chest pain and non-STEMI and other medical problems as discussed below, who comes to the emergency room with complaining of chest pain.  Started this morning at about 6 a.m. while he was working. It is substernal pressure-like deep pain and described as usual anginal pain that he had before, but much less severe.  He took his usual home medication and nitroglycerin sublingually this morning which did not relieve his chest pain completely, although he did a little better.  He decided to come to the emergency room, where he was given another sublingual nitroglycerin that almost resolved his chest pain.  He says that he did not have any further chest pain after he was discharged from the hospital last time in September 2011.  He follows up with his primary cardiologist regularly and he is compliant with his medications. He says that he has been having runny nose, dry cough and flu-like symptoms for a week and he is mildly short of breath for a week, which has got worse today morning.  Past medical history is significant for: 1. Coronary artery disease with status post coronary artery bypass     graft in 1995.  Repeat catheterization in August 2011, showed     patent LIMA to LAD. 2. Diffusely diseased distal LAD. 3.  Occluded vein graft to circumflex system which is old. 4. Occluded vein graft to ICA plus in-stent thrombosis which is new     and was medically managed at that time. 5. Recurrent anginal pain and NSTEMI with multiple hospitalizations in     the past. 6. Ischemic cardiomyopathy with an ejection fraction of 30-35% in     January 2012 with septal and apical akinesis. 7. Paroxysmal atrial fibrillation on Pradaxa. 8. Hypertension. 9. Hyperlipidemia with an LDL of 50 in August 2011. 16.XWRUEAVW mellitus type 2 with an HbA1c of 10.2 in August 2011. 11.Peripheral vascular disease, status post femoral-popliteal bypass     in 1995. 12.Tobacco abuse.  He continues to smoke one pack per day and has been     doing it for years. 13.Medication noncompliance in the past.  He says he is compliant with     his medication at this time.  PAST SURGICAL HISTORY:  Significant for CABG in 1995 and fem-pop bypass in 1995.  SOCIAL HISTORY:  The patient lives in [place] with his wife.  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  He smokes one-pack per day  and denies any alcohol or drug abuse.  FAMILY HISTORY:  Significant for coronary artery disease in his mother at age 37 and coronary artery disease in his father at age 22.  His father also had a cancer of his back, but he is not sure about the details.  REVIEW OF SYMPTOMS:  The patient denies any fevers, chills, headache, vertigo, photophobia and rashes.  He has chest pain, shortness of breath, dyspnea on exertion, cough, some wheezing.  REVIEW OF SYMPTOMS:  Otherwise negative except as per HPI.  CODE STATUS:  The patient is full code at this time.  PHYSICAL EXAMINATION:  VITAL SIGNS:  Temperature 97.4, pulse 89 per minute, respirations 17-18, blood pressure 96-98 systolic/55-60 diastolic, oxygen saturation 98% on room air. GENERAL:  Awake, lying in bed, coughing. HEENT:  Pupils equal and reactive to light.  Extraocular  muscles intact. NECK:  Supple with mildly elevated JVD. CARDIOVASCULAR:  S1 and S2 normal.  No murmurs. LUNGS:  Bilateral coarse breath sounds with some wheezing on both lung fields. SKIN:  No rashes. ABDOMEN:  Soft, nontender, nondistended.  Normal bowel sounds. EXTREMITIES:  No edema.  Normal pulsations. NEUROLOGICAL:  Alert, oriented x3.  Cranial nerves II through XII intact.  Motor strength and sensation is grossly intact in all 4 extremities.  RADIOLOGICAL:  Portable chest x-ray shows mild bronchitis with no acute pneumonia or congestive heart failure.  EKG shows rate of 108 per minute with atrial fibrillation.  No new ST-T wave changes or any other significant abnormalities compared to previous EKGs done in September 2011.  LABORATORY DATA:  At admission shows sodium of 134, potassium 4.2, chloride 99, bicarb 23, BUN 16, creatinine 1.08, glucose 499.  White count 9.8, hemoglobin 15.3, platelet count 134.  Point-of-care cardiac markers are negative for any evidence of ischemia.  ASSESSMENT AND PLAN:  Chest pain given the patient's significant cardiac history and prolonged chest pain since today morning.  We will admit him for 24 hours observation.  We will cycle his cardiac enzymes x3 and repeat his EKG tonight and tomorrow morning.  We will continue all his home medication for his heart without any change at this time.  Addition of Norvasc or long-acting nitrate was attempted in the past for his anginal pain, but his blood pressure did not tolerate it so we will not add that at this time.  Given his history of multiple anginal episodes and low blood pressure, Ranexa could be tried for him if appropriate. Again given his history of cough going on since 1- week and bronchitis, we will start him on azithromycin for upper respiratory tract infection. If it is appropriate, Ranexa can be started at discharge.  We will monitor his blood pressure in the hospital.  If his cardiac  enzymes are negative x3 and EKG does not show any further changes tomorrow morning, he may be discharged home with a prescription for Ranexa and followed up in the [facility] within next 1-2 weeks.  The patient agrees to this plan and this has been discussed with Dr. Avyn Aden [place].     Bethel Born, MD   ______________________________ Vincent Pick. Eden Emms, MD, [facility]    MD/MEDQ  D:  02/12/2011  T:  02/13/2011  Job:  [id number]  Electronically Signed by Bethel Born  on 04/01/2011 12:06:49 PM Electronically Signed by Charlton Haws MD [facility] on 04/04/2011 03:35:51 PM

## 2011-04-16 ENCOUNTER — Other Ambulatory Visit: Payer: Self-pay | Admitting: Cardiology

## 2011-05-06 NOTE — H&P (Signed)
NAMEZAYLIN, RUNCO               ACCOUNT NO.:  [id number]   MEDICAL RECORD NO.:  [id number]          PATIENT TYPE:  EMS   LOCATION:  [facility]                         FACILITY:  [facility]   PHYSICIAN:  Bevelyn Buckles. Bensimhon, MDDATE OF BIRTH:  1946/07/03   DATE OF ADMISSION:  05/12/2008  DATE OF DISCHARGE:                              HISTORY & PHYSICAL   PRIMARY CARDIOLOGIST:  Luis Abed, MD, [facility], although the patient  has not seen him since 1995.   PRIMARY CARE PHYSICIAN:  Angus G. [name], MD   HISTORY OF PRESENT ILLNESS:  Mr. Naeem is a 65 year old Caucasian  gentleman with known history of coronary artery disease, status post  CABBS in February 09, 1994.  Note, there are no medical records  available at this time due to the longevity of his disease.  This is all  per patient's report.  He states that in 1995, he had an MI, and  underwent bypass surgery.  He is unable to recall who did this surgery.  States he also was told at that time he had peripheral vascular disease  and underwent surgery to both groins, and unable to recall who did that  surgery either.  Ongoing tobacco use of one pack a day.  He is a  diabetic.  He apparently stopped his cardiac medications, but he states  he could not afford them anymore.  He has had 1 week of epigastric  discomfort.  He describes as a burning at times, other times as a dull  ache.  It has been intermittent, not associated with any activity or  position.  However, this morning, he was preparing to go to work.  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  He began  having some substernal pressure.  He stopped walking and the discomfort  went away within 2-5 minutes.  One hour later, he was up walking around  again in the post office and experienced chest discomfort again, at this  time, he rated 8 a on a scale of 1-10 and he became short of breath with  that.  It eased off with rest.  He states he did not have any  nitroglycerin, so he did not take anything.  Manager noticed that he  appeared to be uncomfortable and offered to drive him to the emergency  room to get evaluated.  En route, he began to have the chest discomfort  again, it radiated into his left shoulder and his left neck and jaw  area.  No associated symptoms other than the shortness of breath.   On arrival to the ER, he rate his pain at 2.  He was treated with O2  nitroglycerin, chest pain decreased to 1.  He is currently still at 1  with a nitroglycerin patch, and complaining of a headache.   PAST MEDICAL HISTORY:  1. Coronary artery disease.  Apparently, had an MI in 1995, and had      bypass.  2. Dyslipidemia.  3. Peripheral vascular disease, status post questionable fem-pop      bypass in 1995.  4. Ongoing tobacco use.  5. Diabetes type 2.  6. Back surgery secondary to bulging disk.  7. History of EtOH abuses, years ago.  8. Medical noncompliance, secondary to financial issues.   SOCIAL HISTORY:  Lives in [place] with his wife.  He works full-time  for the post [employer].  He has legal responsible for 2 of his  grandchildren.  He smokes one-pack a day.  Denies any EtOH, drug, or  herbal medication use.  Tries to follow a ADA diet.   FAMILY HISTORY:  Both parents deceased secondary complications of CAD.  No siblings.   REVIEW OF SYSTEMS:  Positive for headaches, chest pain, dyspnea on  exertion, questionable claudication symptoms, chronic cough, myalgia,  arthralgia, pain in back, and GERD symptoms.   ALLERGIES:  NO KNOWN DRUG ALLERGIES.   MEDICATIONS:  1. Actos, unclear dose.  2. Metformin, unclear dose.  3. Zocor, unclear dose.  4. Aspirin 325 mg b.i.d.  The patient states he stopped his other medications.   PHYSICAL EXAMINATION:  VITAL SIGNS:  The patient is afebrile with a  heart rate 55-73, respirations 18, blood pressure 139/73, and sat 97% on  2 liters.  GENERAL:  In no acute distress.  Chest pain at 1.   HEENT:  Unremarkable.  NECK:  Supple without lymphadenopathy or JVD.  CARDIOVASCULAR:  Reveals S1 and S2.  Regular rate and rhythm.  The  patient has loud femoral bruits bilaterally.  LUNGS:  He has scattered  rhonchi throughout, otherwise clear to auscultation.  SKIN:  Warm and dry.  ABDOMEN:  Soft, nontender.  Positive bowel sounds.  LOWER EXTREMITIES:  Without clubbing, cyanosis, or edema.  NEUROLOGICAL:  Alert and oriented x3.  Cranial nerves II through XII  grossly intact.   Chest x-ray showed status post CABG, no acute findings.  EKG from this  morning at 9:20 shows sinus rhythm with a incomplete right bundle-branch  block, left anterior fascicular block.  An EKG checked at this time, the  patient now with T-wave inversions in lateral leads.   LABORATORY DATA:  H&H 15.9, 45.8.  WBCs 8.6, platelets 109,000.  Sodium  139, potassium 4.1, chloride 106, BUN 14, creatinine 1.0, glucose 186.  Point-of-care markers; CK-MB 2.7, troponin 0.06.  First set of cardiac  markers; troponin 0.06, CK-MB 3.6, and CK total 108.   IMPRESSION:  1. Unstable angina with history of coronary artery disease, previous      bypass.  No cardiac workup since 1995.  2. Ongoing tobacco use.  3. Diabetes.  4. Dyslipidemia.  5. Peripheral vascular disease.   PLAN:  The patient will be admitted, cycle enzymes, already has a subtle  bump in troponin.  He is anticoagulate with heparin.  We will continue  statin, aspirin, hold Actos, and metformin at this time.  The patient  will need cardiac catheterization for further evaluation.  Risks and  benefits of the procedure have been discussed with the patient.  The  patient agrees to proceed.  Dr. Arvilla Meres in to examine and  assess.  The patient agrees with plan of care.      Dorian Pod, ACNP      Bevelyn Buckles. Bensimhon, MD  Electronically Signed    MB/MEDQ  D:  05/12/2008  T:  05/13/2008  Job:  [id number]   cc:   Angus G. [name], MD

## 2011-05-06 NOTE — Discharge Summary (Signed)
[name], [name]               ACCOUNT NO.:  [id number]   MEDICAL RECORD NO.:  [id number]          PATIENT TYPE:  INP   LOCATION:  6525                         FACILITY:  [facility]   PHYSICIAN:  Luis Abed, MD, FACCDATE OF BIRTH:  [birth year]   DATE OF ADMISSION:  05/12/2008  DATE OF DISCHARGE:  05/17/2008                               DISCHARGE SUMMARY   PRIMARY CARDIOLOGIST:  Luis Abed, MD, [facility].   PRIMARY CARE PHYSICIAN:  Angus G. [name], MD.   PROCEDURES PERFORMED DURING HOSPITALIZATION:  1. Cardiac catheterization completed by Dr. Charlies Constable on May 12, 2008:      a.     Severe three-vessel CAD with total occlusion of LAD, RCA and       distal circumflex, 95% stenosis in the RCA with LIMA graft to the       LAD, 90% mid and 70% proximal stenosis then SVG to RCA with total       occlusion at the SVG to the circumflex, found to have apical       hypokinesis with an EF of 55%.      b.     Successful PCI in the ostial lesion and LIMA to the LAD       using a drug-eluting stent with improvement from 95% to 0%.      c.     Successful PCI SVG to RCA using distal protection and micro       DES drug-eluting stent reducing from 90% stenosis to 10% stenosis       with TIMI-3 flow.   FINAL DISCHARGE DIAGNOSES:  1. Coronary artery disease      a.     Status post coronary artery bypass grafting.      b.     Status post percutaneous coronary intervention to left       anterior descending using a drug-eluting stent on May 12, 2008.      c.     Status post percutaneous coronary intervention to the       saphenous vein graft to right coronary angiography using a Cypher       drug-eluting stent.  2. Dyslipidemia.  3. Peripheral vascular disease status post questionable femoral-      popliteal bypass in 1995.  4. Ongoing tobacco abuse.  5. Diabetes type 2 (known medical noncompliance).  6. History of ethyl alcohol abuse years ago.  7. History of medical noncompliance  secondary to financial issues.   HOSPITAL COURSE:  This is a 65 year old Caucasian male with known  coronary artery disease, peripheral vascular disease, and dyslipidemia,  who presented to [facility] after having had complaints of epigastric  discomfort, burning at times, other times a dull ache which had been  intermittent on his way as he was preparing for work on the day of  admission, he had substernal chest pressure.  His manager noted that he  appeared to be uncomfortable and offered to drive him to the emergency  room.  His chest discomfort became worse during transport to emergency  room  and it radiated the left shoulder and left neck and jaw.   On arrival to the emergency room, the patient was treated in the ER with  oxygen and nitroglycerin and the chest pain diminished.  He was admitted  to rule out myocardial infraction.  He was started on heparin and  nitroglycerin.  His metformin and Actos was held and cardiac enzymes  were cycled.  It was noted that he did have a mild bump on admission at  0.06 with a CK of 108 and an MB of 3.6.  The patient was then taken to  cardiac catheterization lab, where cardiac catheterization was  completed.  Please see Dr. Regino Schultze thorough cardiac catheterization  notes for more details.  It was noted that the patient did have a 95%  occlusion in the LIMA to LAD and did undergo PCI to that site using a  drug-eluting stent reducing it from 95% to 0%.  The patient tolerated  the procedure well without evidence of hematoma, bleeding, signs of  infection, or recurrent discomfort.  The patient was kept in the  hospital a couple of more days post procedure to have a repeat  catheterization and PCI to the saphenous vein graft of the right  coronary artery.  The patient did well post the first procedure and had  no recurrence of chest discomfort until following procedure on May 16, 2008.  The patient underwent initial staged PCI per Dr. Juanda Chance on  the  May 16, 2008, which was successful reducing the SVG to RCA 90% lesion  down to 10%.  The patient was placed on Plavix, aspirin, Zocor, and  tolerated recovery without any other incident of chest discomfort or  groin site complications.   The patient was seen by [facility] and was improved  to be enrolled in a ADAPT  study, but the patient refused.  The patient  was also seen by cardiac rehab and was given smoking cessation advice  and education.  Uncertain, if the patient will be complaint.   The patient was also found to have an elevated hemoglobin A1c which is  8.7 indicating that the patient will have poor glucose control.  He was  counseled by our diabetes educator who recommended adding Lantus 10  units daily.  The patient admits to not taking Actos daily and only the  metformin because of financial issues.   On the day of discharge, the patient was seen and examined by Dr.  Willa Rough and found to be stable.  He will follow up with Dr. Myrtis Ser  and outpatient cardiac rehab.  He will also follow up with Dr. [name]  for which he has an appointment this Thursday for physical exam.  It is  our recommendations for patient to be on tighter control of diabetes and  compliance remains a issue.  Concerning this, he will meet with Dr.  [name] for better recommendation as he knows the patient well.   DISCHARGE LABS:  Troponin 0.03, 0.03, and 0.003 respectively, TSH is  0.648, sodium 141, potassium 3.6, chloride 109, CO2 25, glucose 106, BUN  5, creatinine 0.56, cholesterol 157, lipids 80, HDL 45, and LDL 96.  Ultrasound of the abdomen completed on May 16, 2008, revealed large  gallstone without acute complications.  Pancreas suboptimally  visualized, no other acute significant findings.  EKG on discharge,  sinus bradycardia with ventricular rate of 48 beats per minute with the  first-degree AV block with PR interval at 2.23.  An anterior septal  infarct was  noted with Q-waves seen in V1, V2, and V3.  Q-wave  abnormality noted on V5 and V6 with T-wave inversion noted.   DISCHARGE MEDICATIONS:  1. Aspirin 325 mg daily.  2. Plavix 75 mg daily.  3. Pepcid 20 mg daily.  4. Zocor 40 mg at bedtime.  5. Actos as at home.  6. Metformin as at home.  7. Nitroglycerin sublingual.  8. Dobutamine p.r.n. recurrent chest pain.   ALLERGIES:  No known drug allergies.   FOLLOWUP PLANS AND APPOINTMENT:  1. The patient is to followup appointment previously scheduled with      primary care physician Dr. [name] on Thursday on May 19, 2008.  2. The patient is to follow up with Dr. Willa Rough on Monday May 29, 2008, at 3 p.m.  3. The patient has been given post cardiac catheterization      instructions with particular emphasis on the right groin site for      evidence of bleeding, hematoma, and signs of infection.  4. The patient is to return to work on May 23, 2008, light duty.  A      work excuse has been provided.  5. The patient has been advised on medical compliance, especially with      Plavix as he may have recurrences of his symptoms without      consistent use.  The patient verbalizes understanding.      Bettey Mare. Lyman Bishop, NP      Luis Abed, MD, [facility]  Electronically Signed    KML/MEDQ  D:  05/17/2008  T:  05/18/2008  Job:  [id number]   cc:   Angus G. [name], MD

## 2011-05-06 NOTE — Letter (Signed)
December 27, 2008    Angus G. [name], MD  [address]  [place], [place] [address]   RE:  [name], [name]  MRN:  [id number]  /  DOB:  [birth year]   Dear Thalia Party,   [name] returns to the office for continued assessment and treatment  of coronary disease and cardiovascular risk factors.  Since his stent  procedure a few months ago, he has had no chest discomfort nor dyspnea.  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  He is  having problems affording his medication and is no longer taking Actos  due to the 50 dollar per month copay.   His current medications include:  1. Clopidogrel 75 mg daily.  2. Metformin 500 mg daily.  3. Aspirin 325 mg daily.  4. Simvastatin 40 mg daily.  5. Metoprolol 12.5 mg b.i.d.   His recent lipid profile was suboptimal and hemoglobin A1c level was  7.7.   PHYSICAL EXAMINATION:  GENERAL:  Pleasant gentleman in no acute  distress.  VITAL SIGNS:  The weight is 198, stable.  Blood pressure 140/85, heart  rate 64 and regular, respirations 14.  NECK:  No jugular venous distention; normal carotid upstrokes without  bruits.  LUNGS:  Clear.  CARDIAC:  Normal first and second heart sounds.  ABDOMEN:  Soft and nontender; no organomegaly.  EXTREMITIES:  No edema.   IMPRESSION:  [name] is doing well symptomatically.  His control of  risk factors is suboptimal.  He will increase metformin to 500 mg b.i.d.  He will increase metoprolol to 25 mg b.i.d.  He will increase  simvastatin to 80 mg daily.  He cannot afford Niaspan.  Niacin will be  started with an attempt to titrate to 500 mg t.i.d.  A lipid profile and  chemistry profile will be obtained in 2 months.  [name] is asked to  stop smoking cigarettes.  We discussed techniques and in the fact that  he has stopped for 4 years in the past.  I will see this nice gentleman  again in 4 months.    Sincerely,      Gerrit Friends. Dietrich Pates, MD, [facility]  Electronically Signed    RMR/MedQ  DD: 12/27/2008  DT: 12/28/2008  Job #: [id number]

## 2011-05-06 NOTE — Assessment & Plan Note (Signed)
[facility]                            CARDIOLOGY OFFICE NOTE   [name], [name]                      MRN:          [id number]  DATE:05/29/2008                            DOB:          Jun 07, 1946    Mr. Kendzierski is here for post-hospital cardiology followup.  I have seen  him in the past.  He presented with chest burning.  He was treated with  appropriate medications for his heart at the hospital.  He was cathed  immediately.  Ultimately he underwent PCI.  He stabilized.  Unfortunately he continues to smoke.  Historically, he has had  difficulties getting his medicines.  We talked today about how important  it is for him to remain on his aspirin and Plavix.  He seems motivated  to do so.  He is not having any major medical problems as of today.   PAST MEDICAL HISTORY:   ALLERGIES:  No known drug allergies.   MEDICATIONS:  1. Actos.  2. Plavix 75.  3. Metformin.  4. Aspirin 325.  5. Zocor 40.   OTHER MEDICAL PROBLEMS:  See the list below.   REVIEW OF SYSTEMS:  He is actually doing well.  He has very slight  discomfort in the cath site which is improving.  Otherwise review of  systems is negative.   PHYSICAL EXAM:  Weight is 199 pounds.  Blood pressure is 132/80 with a  pulse of 83.  The patient is oriented to person, time and place.  Affect is normal.  HEENT:  Reveals no xanthelasma.  He has normal extraocular motion.  There are no carotid bruits.  There is no jugular venous distention.  Lungs are clear.  Respiratory effort is not labored.  The patient smells  of cigarette smoke.  CARDIAC:  Exam reveals S1 with an S2.  There are no clicks or  significant murmurs.  His abdomen is soft.  He has no peripheral edema.  His cath site is stable.   EKG reveals sinus rhythm with evidence of an old anteroseptal MI.   PROBLEMS:  1. History of medical noncompliance in the past related to financial      issues and we are doing everything we can  to help.  2. Diabetes.  3. Ongoing tobacco use.  We talked with him and continued to try to      help him stop.  4. Peripheral vascular disease with vascular surgery in the past.  5. Dyslipidemia on medication.  6. History of coronary artery bypass graft surgery in the past.  7. Status post drug-eluting stent to the left anterior descending on      May 12, 2008.  8. Status post drug-eluting stent to the saphenous vein graft to the      right coronary artery.   It is extremely important that he remain on his aspirin and Plavix and,  of course, follow all of his other secondary prevention measures.  I  will see him back in 3 months.     Luis Abed, MD, [facility]  Electronically Signed    JDK/MedQ  DD:  05/29/2008  DT: 05/29/2008  Job #: [id number]   cc:   Angus G. [name], MD

## 2011-05-06 NOTE — Discharge Summary (Signed)
[name], [name]               ACCOUNT NO.:  [id number]   MEDICAL RECORD NO.:  [id number]          PATIENT TYPE:  INP   LOCATION:  2039                         FACILITY:  [facility]   PHYSICIAN:  Luis Abed, MD, FACCDATE OF BIRTH:  Mar 19, 1946   DATE OF ADMISSION:  09/05/2008  DATE OF DISCHARGE:  09/07/2008                               DISCHARGE SUMMARY   PRIMARY CARDIOLOGIST:  Luis Abed, MD, [facility].   PRIMARY CARE PHYSICIAN:  Dr. [name] in [place].   PROCEDURES PERFORMED DURING HOSPITALIZATION:  1. Echocardiogram.      a.     Normal LV function with EF of 50%, no definite wall motion       abnormalities.  There was a pseudonormal diastolic function.   FINAL DISCHARGE DIAGNOSES:  1. Pneumonia.  2. Known history of coronary artery disease.      a.     Status post coronary artery bypass grafting in 1995, left       internal mammary artery graft to the left anterior descending       coronary artery, vein graft to right coronary artery, and vein       graft to left circumflex.      b.     Status post myocardial infarction in 1995.      c.     Status post cardiac catheterization in May 2009, revealed       native 3-vessel disease, left internal mammary artery graft to       left anterior descending coronary artery with a promise 3.0 x 8 mm       drug-eluting stent.  There was a Cypher 3.0 x 33 mm stent placed       to the vein graft to the right coronary artery.  3. Diabetes.  4. Ongoing tobacco abuse.  5. Peripheral vascular disease.  6. Remote history of EtOH abuse.   HOSPITAL COURSE:  A 65 year old Caucasian male with history of coronary  artery disease with recent admission in May 2009 for unstable angina  with PCI DES to the vein graft to the right coronary artery and LIMA to  LAD.  The patient had progressive cough with yellow sputum along with  subscapular chest pain and had no radiation, and it was not associated  with exertion.  The patient states  that his breathing status declined,  and he presented to [facility] for reevaluation.  The  patient was found to be in atrial fibrillation and RVR on admission, and  was placed on a diltiazem drip temporarily, but converted back to normal  sinus rhythm without difficulty.   The following day, the patient's cardiac enzymes returned with negative  enzymes x3.  The patient also had an echocardiogram completed at the  request of Dr. Myrtis Ser, revealing normal LV function with an EF of 50% with  no definite wall motion abnormality.  The patient continued to have some  subscapular chest pain with persistent cough and was noted that his  white blood cells remained elevated at 4.3 and 4.7 respectively.  The  patient  was not found to be a candidate for Coumadin, and he remained in  normal sinus rhythm throughout the remainder of hospitalization without  use of Cardizem.  The patient was placed on Lopressor 12.5 mg b.i.d.  during hospitalization and tolerated it well.   On day of discharge, the patient continued to complain of chest  discomfort and cough, productive sputum, but cardiac enzymes were found  to be negative and echo was negative.  Therefore, the patient was  discharged home on Zithromax and Robitussin and with advised to follow  up with primary care physician, Dr. [name] in [place] within the  next week.  He will also follow up with Dr. Willa Rough for continued  cardiac management and evaluation in approximately 1 month to 6 weeks on  a previously scheduled appointment.   DISCHARGE LABORATORIES:  Sodium 135, potassium 4.5, chloride 104, CO2  24, BUN 10, creatinine 0.89, and glucose 193.  Hemoglobin 14.5,  hematocrit 41.5, white blood cells 14.7, and platelets 170.  Troponin  less than 0.05, 0.06, and 0.03 respectively.  Hemoglobin A1c 7.7, TSH  0.821, and magnesium 1.9.   CT chest revealed negative for PE, but left lower lobe mediobasal  segment consolidation  consistent with pneumonia and/or aspiration.  Chest x-ray revealing no acute pulmonary disease.   EKG revealing sinus rhythm with a first-degree AV block, PR interval  0.22, and evidence of anterior and septal infarct with left anterior  fascicular block.   DISCHARGE MEDICATIONS:  1. Z-Pak 1 take as directed.  2. Robitussin q.4 h. p.r.n. cough.  3. Lopressor 25 mg 1/2 tablet twice a day.  4. Plavix 75 mg daily.  5. Metformin daily.  6. Aspirin 325 daily.  7. Zocor 40 mg daily.  8. Nitroglycerin 0.4 sublingual p.r.n.   ALLERGIES:  No known drug allergies.   FOLLOWUP PLANS AND APPOINTMENTS:  1. The patient has been advised to follow up with Dr. [name], primary      care physician for continued management secondary to      hospitalization with pneumonia along with diabetes management.  2. The patient is to follow up with Dr. Willa Rough on a previously      scheduled appointment.  3. The patient has been advised on addition of antibiotics and      Lopressor to medication regimen.   Time spent with the patient to include physician time 40 minutes.      Bettey Mare. Lyman Bishop, NP      Luis Abed, MD, [facility]  Electronically Signed    KML/MEDQ  D:  09/07/2008  T:  09/07/2008  Job:  [id number]   cc:   Angus G. [name], MD

## 2011-05-06 NOTE — H&P (Signed)
[name], [name]               ACCOUNT NO.:  [id number]   MEDICAL RECORD NO.:  [id number]          PATIENT TYPE:  INP   LOCATION:  2039                         FACILITY:  [facility]   PHYSICIAN:  Madolyn Frieze. Jens Som, MD, FACCDATE OF BIRTH:  1946/04/12   DATE OF ADMISSION:  09/05/2008  DATE OF DISCHARGE:                              HISTORY & PHYSICAL   PRIMARY CARDIOLOGIST:  Luis Abed, MD, [facility]   PRIMARY CARE [name]:  [name], MD   PATIENT PROFILE:  A 65 year old Caucasian male with prior history of  CAD, status post CABG x3, and PCI with drug-eluting stent placement in  May 2009 who presents with recurrent chest pain.   PROBLEMS:  1. Chest pain/CAD.      a.     Status post MI in 1995.      b.     Status post CABG x3 in 1995 with a LIMA to the LAD, vein       graft to the RCA, and vein graft to the left circumflex.      c.     On May 12, 2008, cardiac catheterization revealing native 3-       vessel disease with 95% ostial stenosis in the LIMA to the LAD, a       70% proximal and 90% mid stenosis in the vein graft to the RCA,       and 100% occluded vein graft to left circumflex.  The LIMA to the       LAD was treated with 3.0 x 8 mm PROMUS drug-eluting stent.      d.     On May 16, 2008, PCI and stenting of the vein graft to the       right coronary artery with placement of a 3.0 x 33 mm Cypher drug-       eluting stent.  2. Peripheral vascular disease with history of fem-pop bypass.  3. Ongoing tobacco abuse, 40-pack-year history, currently smoking 1      pack a day.  4. Type 2 diabetes mellitus.  5. Medication nonadherence secondary to finances.  6. Remote alcohol abuse, quitting in 1999.   ALLERGIES:  No known drug allergies.   HISTORY OF PRESENT ILLNESS:  A 65 year old Caucasian male with a history  of CAD, status post CABG x3 with recent admission in May 2009 for  unstable angina, resulting in cardiac catheterization and subsequent PCI  and drug eluting  stent placement and vein graft to the RCA and LIMA to  the LAD.  Since his discharge in May, he has done well from a cardiac  perspective.  Approximately 1 week ago, he noted productive cough with  yellow sputum.  There was no fevers or chills.  On Saturday while at  work, he noted sudden onset of substernal chest pressure without  associated symptoms.  He rated the discomfort as 2/10 without radiation.  Pain did not change with exertion, palpation, coughing, deep breathing,  or position changes.   Symptoms persisted all day Saturday as well as Sunday and Monday without  any worsening.  This morning because of ongoing symptoms, he took the  nitroglycerin without relief and then went to [facility] for  evaluation.  There his ECG showed no acute changes, and his cardiac  markers were negative.  He was given a breathing treatment around 11  o'clock and subsequently went to AFib with RVR.  He was then given a  diltiazem bolus, followed by infusion and has since converted back to  sinus rhythm.  He denies any chest pain or shortness of breath while in  AFib.  He did note tachy palpitations while on AFib and denies having  ever experienced that at home before.  Currently, he reports 1/10 chest  discomfort that is slightly worse with deep breathing, but he is  otherwise comfortable.  He notes that his pain over the past couple of  days is not similar to his previous angina.   HOME MEDICATIONS:  1. He is supposed to be on Actos, cannot afford it.  2. Plavix 75 mg daily, says he does not miss the dose.  3. Metformin, unknown dose once a day.  4. Aspirin 325 mg daily.  5. Zocor 40 mg nightly, although he has been out of this for sometime.  6. Nitroglycerin p.r.n.   FAMILY HISTORY:  Mother died of CAD.  Father died of CAD.  He has no  siblings.   SOCIAL HISTORY:  He lives in [place] with his wife.  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  He, remotely, drank heavily, but quit in 1999.  Denies drug use and is not exercising.   REVIEW OF SYSTEMS:  Positive for chest pain and productive cough.  Positive for diabetes.  Otherwise all system review is negative.  He is  a full code.   PHYSICAL EXAMINATION:  VITAL SIGNS:  He is afebrile, heart rate is 65,  respirations 16, and blood pressure is 132/68.  GENERAL:  Pleasant white man in no acute distress.  Awake, alert, and  oriented x3.  HEENT:  Normal.  NEUROLOGIC:  Grossly intact and nonfocal.  SKIN:  Warm and dry without lesions or masses.  NECK:  No bruits or JVD.  LUNGS:  Respirations are unlabored with diminished breath sounds at the  bases and scattered rhonchi throughout.  CARDIAC:  Regular.  Distant S1, S2.  No S3, S4, or murmurs.  ABDOMEN:  Round, soft, nontender, and nondistended.  Bowel sounds  positive x4.  EXTREMITIES:  Warm, dry, and pink.  No clubbing, cyanosis, or edema.  Dorsalis pedis and posterior tibialis pulses 2+ and equal bilaterally.   Chest x-[facility] from [facility] showed no acute cardiopulmonary disease.  EKG shows sinus rhythm with first-degree AV block, poor R-wave  progression.  No acute ST-T changes.   ASSESSMENT AND PLAN:  1. Chest pain.  Atypical and constant since Saturday.  His point of      care markers are negative from [facility].  He knows that his pain      is different from previous angina, and there may be a slight      pleuritic component to it in the setting of recent development of      productive cough.  His chest x-ray did not show any acute      cardiopulmonary disease.  His D-dimer was elevated at 0.55.  We      will plan to cycle cardiac markers, and check a CT of  his chest to      rule out pulmonary embolism.  Otherwise, we will plan to continue      aspirin and Plavix, or a low-dose beta blocker and add back his      statin.  Notably, the patient is not on ACE inhibitor.  However, we       should consider initiating one with prior history of myocardial      infarction, diabetes, and wall motion abnormality.  2. Atrial fibrillation.  This occurred following a breathing treatment      this afternoon.  It did resolve with diltiazem infusion.  We will      discontinue this or discontinue diltiazem and start initiating low-      dose beta-blocker.  He is not wheezing on exam.  The patient's [place]      score is 1 with a history of diabetes.  He is also a poor Coumadin      candidate secondary to history on noncompliance.  For the time      being, we will plan to use aspirin only and watch him on telemetry.      We will check a TSH and free T4.  His electrolytes are within      normal limits.  We have ordered a 2-D echocardiogram.  3. Ongoing tobacco abuse.  Smoking cessation is strongly advised.  4. Type 2 diabetes mellitus.  We are going to hold his metformin in      the setting of according to CT angio of his chest.  We will      reinstitute Actos and add sliding-scale insulin.  The patient does      have a history of noncompliance.      Nicolasa Ducking, ANP      Madolyn Frieze. Jens Som, MD, [facility]  Electronically Signed    CB/MEDQ  D:  09/05/2008  T:  09/06/2008  Job:  [id number]

## 2011-05-06 NOTE — Letter (Signed)
Apr 25, 2009    Angus G. [name], MD  [address]  [place], [place] [address]   RE:  SAKAI, WOLFORD  MRN:  [id number]  /  DOB:  1946-04-06   Dear Thalia Party:   Mr. Yurkovich returns to the office for continued assessment and treatment  of coronary artery disease and cardiovascular risk factors.  Since his  last visit, he has felt fine.  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  Accordingly, his blood pressure control is still  marginal.  His hemoglobin A1c level is still somewhat elevated, and his  lipids are still suboptimal, although less so.   CURRENT MEDICATIONS:  Include  1. Clopidogrel 75 mg daily.  2. Metformin 500 mg daily.  3. Aspirin 325 mg daily.  4. Metoprolol b.i.d. - Dose uncertain.  5. Simvastatin 40 mg daily.   PHYSICAL EXAMINATION:  GENERAL:  A pleasant gentleman, in no acute  distress.  VITAL SIGNS:  The weight is 198, unchanged.  Blood pressure 140/90,  heart rate 64 and regular, respirations 16 and unlabored.  NECK:  No  jugular venous distention; no carotid bruits.  LUNGS:  Clear.  CARDIAC:  Normal first and second heart sounds; modest systolic ejection  murmur.  ABDOMEN:  Soft and nontender; no organomegaly.  EXTREMITIES:  1-2+ distal pulses on the right; left dorsalis pedis  cannot be obtained with Doppler; left posterior tibial is barely  palpable.   LABORATORY DATA:  Most recent laboratory is from March.  Chemistry  profile was normal except for a glucose of 179, total cholesterol was  170, triglycerides 225, HDL 32, and LDL 93.  Hemoglobin A1c was 7.7.   IMPRESSION:  Mr. Prows is doing well from a symptomatic standpoint.  He  would benefit by better control blood pressure, blood glucose, and  lipids.  I will once again ask him to increase metformin to 500 mg  b.i.d..  His metoprolol  dose will be increased once we determine what he  is taking.  I will suggest niacin with a titration 500 mg t.i.d. once  again to increase HDL and decrease triglycerides.  We discussed smoking,  but he is not motivated to discontinue use of tobacco products.  I will  see this nice gentleman again in 6 months.    Sincerely,      Gerrit Friends. Dietrich Pates, MD, [facility]  Electronically Signed    RMR/MedQ  DD: 04/25/2009  DT: 04/26/2009  Job #: [id number]

## 2011-05-06 NOTE — Assessment & Plan Note (Signed)
[facility]                       [place] [facility] OFFICE NOTE   [name], [name]                      MRN:          [id number]  DATE:09/18/2008                            DOB:          10-Mar-1946    CARDIOLOGIST:  Cardiologist was previously [name].  He is now  reestablishing with the [facility] and I will have him followup  with [name].   PRIMARY CARE PHYSICIAN:  Vincent G. [name], MD   REASON FOR VISIT:  Post-hospitalization followup.   HISTORY OF PRESENT ILLNESS:  [name] is a 65 year old male patient  with a history of coronary artery disease, status post bypass surgery in  1995 in the setting of myocardial infarction who recently was evaluated  by our team in [facility] in May 2009, with cardiac catheterization.  He  was noted to have native three-vessel CAD.  He had a 95% ostial stenosis  in the LIMA graft to the LAD that was treated with a Promus drug-eluting  stent.  He had a 70% proximal and 90% mid stenosis in the vein graft to  the RCA, which was treated with a Cypher drug-eluting stent.  He had a  totally occluded vein graft to the left circumflex, which was treated  medically.  He did well and followed up with [name] in June in our  [place] office.  He was admitted to [facility] on September 05, 2008, after presenting with productive cough and chest pain.  He was  initially evaluated at [facility] where he developed atrial  fibrillation with rapid ventricular rate after receiving a breathing  treatment.  According to the discharge notes, he was given diltiazem  temporarily and he converted to sinus rhythm.  He apparently remained in  sinus rhythm throughout the remainder of his hospital stay.  He was  placed on low-dose Lopressor.  An echocardiogram revealed normal LV  function with an EF of 55%.  No left ventricular wall motion  abnormalities were noted, but he did have mild  mitral regurgitation.  The patient preferred to follow up locally and discussed this with Dr.  Myrtis [name] who apparently told to the patient that this was acceptable.  In  the office today, he notes he is doing well.  He denies any recurrent  chest discomfort or shortness of breath.  He describes NYHA class I-II  symptoms.  He denies syncope or near-syncope.  He denies orthopnea or  PND.  He denies any pedal edema.  He denies any palpitations or  lightheadedness.  The patient denies any symptoms of claudication.   PAST MEDICAL HISTORY:  As outlined above.  In addition, he has a history  peripheral arterial disease, status post fem-pop bypass grafting.  He  also has a history of diabetes mellitus and dyslipidemia.  He also has a  history of TIA in the past.  He has a past history of alcohol abuse and  also has a history of ongoing tobacco abuse.   CURRENT MEDICATIONS:  Actos daily, Plavix 75 mg daily, Metformin 500 mg  daily, Aspirin 325 mg daily, Zocor  40 mg nightly, Nitroglycerin p.r.n.  chest pain.   ALLERGIES:  No known drug allergies.   SOCIAL HISTORY:  The patient continues to smoke cigarettes.  He has a 40  pack-year history.  He lives in [place] with his wife.   FAMILY HISTORY:  Significant for CAD.   PHYSICAL EXAMINATION:  GENERAL:  He is a well-nourished and well-  developed male in no acute stress.  VITAL SIGNS:  Blood pressure is 140/86, pulse 68, and weight 199 pounds.  HEENT:  Normal.  NECK:  Without JVD.  LYMPH:  Without lymphadenopathy.  ENDOCRINE:  Without thyromegaly.  CARDIAC:  Distant S1 and S2.  Regular rate and rhythm.  LUNGS:  With decreased breath sounds bilaterally.  No rales.  No  wheezing.  ABDOMEN:  Soft and nontender.  EXTREMITIES:  Without edema.  NEUROLOGIC:  He is alert and oriented x3.  Cranial nerves II through XII  are grossly intact.  SKIN:  Warm and dry.   ASSESSMENT AND PLAN:  1. Coronary artery disease with a history of myocardial  infarction in      1995, followed by CABG x3 and recent cardiac catheterization and      percutaneous coronary intervention in May 2009.  At that time, he      had a Promus drug-eluting stent placed in the LIMA to the LAD and a      Cypher drug-eluting stent placed in the vein graft to the RCA.  His      vein graft to the circumflex is known to be totally occluded.  He      has overall preserved LV function.  He is doing well without any      recurrent episodes of chest discomfort.  His recent admission with      chest discomfort is likely secondary to chronic obstructive      pulmonary disease exacerbation/pneumonia.  He was treated with      antibiotics for this and this seems to have resolved.  He is on      aspirin and Plavix.  He will need to remain on aspirin      indefinitely.  He will likely need to be on Plavix for a minimum of      a year, probably longer.  The patient does have a history of      myocardial infarction.  He stopped taking metoprolol on his own.      He had no side effects with this.  I explained to him the      importance of using a beta-blocker in a patient who has a history      of myocardial infarction.  Also in the setting of his recent atrial      fibrillation, I think this is warranted.  He will be placed back on      metoprolol 12.5 mg b.i.d.  2. Paroxysmal atrial fibrillation in the setting of bronchodilator use      for chronic obstructive pulmonary disease exacerbation/pneumonia.      According to the hospital records, it was not felt that the patient      needed Coumadin.  I think in the setting of his bronchodilator use      and chronic obstructive pulmonary disease exacerbation, his atrial      fibrillation was likely situational.  The patient will have a      rhythm strip today to document his rhythm.  As noted above, he will  need to remain on aspirin and Plavix.  Hopefully, he will not      require Coumadin therapy in the future.  Of note, his  CHADS2 score      is 3 (history of TIAs, diabetes mellitus).  3. Dyslipidemia.  This is followed by Dr. Renard [name].  His goal LDL is      less than or equal to 70.  I have suggested that he have his lipids      checked again in October 2009.  In July, his HDL was 29 and his LDL      was 102.  Apparently, around that time, he was not taking his      simvastatin correctly.   DISPOSITION:  The patient will be brought back in followup with Dr.  Dietrich [name] in the next 3 months or sooner p.r.n.      Tereso Newcomer, PA-C  Electronically Signed      Gerrit Friends. Dietrich Pates, MD, [facility]  Electronically Signed   SW/MedQ  DD: 09/18/2008  DT: 09/19/2008  Job #: [id number]   cc:   Vincent G. [name], MD

## 2011-05-08 ENCOUNTER — Ambulatory Visit (INDEPENDENT_AMBULATORY_CARE_PROVIDER_SITE_OTHER): Payer: BC Managed Care – PPO | Admitting: Cardiology

## 2011-05-08 ENCOUNTER — Encounter: Payer: Self-pay | Admitting: Cardiology

## 2011-05-08 DIAGNOSIS — I4891 Unspecified atrial fibrillation: Secondary | ICD-10-CM

## 2011-05-08 DIAGNOSIS — E785 Hyperlipidemia, unspecified: Secondary | ICD-10-CM

## 2011-05-08 DIAGNOSIS — I739 Peripheral vascular disease, unspecified: Secondary | ICD-10-CM

## 2011-05-08 DIAGNOSIS — I480 Paroxysmal atrial fibrillation: Secondary | ICD-10-CM

## 2011-05-08 DIAGNOSIS — I2510 Atherosclerotic heart disease of native coronary artery without angina pectoris: Secondary | ICD-10-CM

## 2011-05-08 DIAGNOSIS — IMO0002 Reserved for concepts with insufficient information to code with codable children: Secondary | ICD-10-CM

## 2011-05-08 DIAGNOSIS — Z7289 Other problems related to lifestyle: Secondary | ICD-10-CM | POA: Insufficient documentation

## 2011-05-08 MED ORDER — POTASSIUM CHLORIDE 10 MEQ PO TBCR: 10.0000 meq | EXTENDED_RELEASE_TABLET | Freq: Every day | ORAL | Status: DC

## 2011-05-08 MED ORDER — CARVEDILOL 25 MG PO TABS: 25.0000 mg | ORAL_TABLET | Freq: Two times a day (BID) | ORAL | Status: DC

## 2011-05-08 MED ORDER — GLIMEPIRIDE 2 MG PO TABS: 2.0000 mg | ORAL_TABLET | Freq: Every day | ORAL | Status: DC

## 2011-05-08 MED ORDER — ASPIRIN 81 MG PO TABS: 81.0000 mg | ORAL_TABLET | Freq: Every day | ORAL | Status: DC

## 2011-05-08 NOTE — Patient Instructions (Signed)
Your physician recommends that you schedule a follow-up appointment in:7 MONTHS Your physician has recommended you make the following change in your medication: DECREASE ASPIRIN TO 81MG  DAILY, ALDACTONE 25MG  DAILY, INCREASE CARVEDILOL TO 25MG  TWICE DAILY, AMARYL 2MG  DAILY STOOL CARDS: PLEASE FOLLOW DIRECTIONS IN PACKET

## 2011-05-08 NOTE — Assessment & Plan Note (Signed)
Although patient has known peripheral vascular disease with previous surgical intervention and has symptoms highly suggestive for claudication, distal pulses are intact.  I do not believe that any further testing or intervention would be useful to him at the present time.

## 2011-05-08 NOTE — Assessment & Plan Note (Addendum)
No recurrent symptoms over the past few months.  Although he has known occlusion of vein grafts in the distribution of the right coronary artery and circumflex, he has not had clear ischemic symptoms.  Current medication provides adequate treatment for his ischemic cardiomyopathy except for his dose of carvedilol, which will be increased to 25 mg b.i.d.  He has not had stage III or 4 heart failure and thus does not meet criteria for treatment with an aldosterone antagonist.

## 2011-05-08 NOTE — Assessment & Plan Note (Signed)
Lipid profile less than one year ago shows adequate control of hyperlipidemia with current therapy, which will be continued.

## 2011-05-08 NOTE — Progress Notes (Signed)
HPI : [name] returns to the office as scheduled for continued assessment and treatment of ischemic heart disease with ischemic cardiomyopathy.  Since his last visit, he has done fairly well.  He denies ever having had clinical congestive heart failure.  Review of prior records and chest x-rays verified that CHF has not been documented.  His most recent ejection fraction was approximately 35% by echocardiography.  He has not recently experienced dyspnea or chest discomfort.  Hospitalization in February was for chest discomfort.  Myocardial infarction was ruled out, and no specific etiology was identified.  PT, PTT and heparin levels were normal  [name] has long-standing discomfort in the calves with exertion, walking a few 100 feet on flat ground or up one or 2 flights of stairs.  Symptoms resolve promptly with rest.   Current Outpatient Prescriptions on File Prior to Visit  Medication Sig Dispense Refill  . dabigatran (PRADAXA) 150 MG CAPS Take 150 mg by mouth every 12 (twelve) hours.        . digoxin (LANOXIN) 0.125 MG tablet Take 125 mcg by mouth daily.        . furosemide (LASIX) 20 MG tablet Take 20 mg by mouth as needed.        . hydrocodone-acetaminophen (LORCET-HD) 5-500 MG per capsule Take 1 capsule by mouth every 6 (six) hours as needed.        . metFORMIN (GLUCOPHAGE) 1000 MG tablet Take 1,000 mg by mouth 2 (two) times daily with a meal.        . nitroGLYCERIN (NITROSTAT) 0.4 MG SL tablet Place 0.4 mg under the tongue every 5 (five) minutes as needed.        . rosuvastatin (CRESTOR) 40 MG tablet Take 40 mg by mouth daily.        Marland Kitchen DISCONTD: aspirin 325 MG tablet Take 325 mg by mouth daily.        Marland Kitchen DISCONTD: carvedilol (COREG) 12.5 MG tablet Take 12.5 mg by mouth 2 (two) times daily with a meal.        . DISCONTD: lisinopril (PRINIVIL,ZESTRIL) 20 MG tablet TAKE ONE (1) TABLET EACH DAY  30 tablet  1  . DISCONTD: potassium chloride (KLOR-CON) 10 MEQ CR tablet Take 10 mEq by mouth daily.         Marland Kitchen glimepiride (AMARYL) 2 MG tablet Take 1 tablet (2 mg total) by mouth daily before breakfast.  30 tablet  3     No Known Allergies    Past medical history, social history, and family history reviewed and updated.  ROS: Denies orthopnea, PND, lightheadedness or syncope.  No cough or sputum production.  PHYSICAL EXAM: BP 116/69  Pulse 79  Wt 188 lb (85.276 kg)  SpO2 92%  General-Well developed; no acute distress Body habitus-proportionate weight and height Neck-No JVD; no carotid bruits Lungs-clear lung fields; resonant to percussion Cardiovascular-normal PMI; normal S1 and S2; minor systolic ejection murmur Abdomen-normal bowel sounds; soft and non-tender without masses or organomegaly Musculoskeletal-No deformities, no cyanosis or clubbing Neurologic-Normal cranial nerves; symmetric strength and tone Skin-Warm, no significant lesions Extremities-distal pulses intact; no edema  ASSESSMENT AND PLAN:

## 2011-05-08 NOTE — Assessment & Plan Note (Addendum)
Most recent EKG is from February 2012, when atrial fibrillation was present.  He will require lifelong anticoagulation Control of heart rate is adequate with beta blocker and digoxin.

## 2011-05-14 ENCOUNTER — Ambulatory Visit: Payer: BC Managed Care – PPO | Admitting: Cardiology

## 2011-06-02 ENCOUNTER — Ambulatory Visit: Payer: BC Managed Care – PPO | Admitting: Cardiology

## 2011-06-21 ENCOUNTER — Other Ambulatory Visit: Payer: Self-pay | Admitting: Cardiology

## 2011-07-21 ENCOUNTER — Other Ambulatory Visit: Payer: Self-pay | Admitting: Cardiology

## 2011-08-26 ENCOUNTER — Other Ambulatory Visit: Payer: Self-pay

## 2011-08-26 MED ORDER — ROSUVASTATIN CALCIUM 40 MG PO TABS: 40.0000 mg | ORAL_TABLET | Freq: Every day | ORAL | Status: DC

## 2011-08-26 NOTE — Telephone Encounter (Signed)
..   Requested Prescriptions   Signed Prescriptions Disp Refills  . rosuvastatin (CRESTOR) 40 MG tablet 30 tablet 6    Sig: Take 1 tablet (40 mg total) by mouth daily.    Authorizing Provider: [name]    Ordering User: Candi Leash to [place].

## 2011-09-08 ENCOUNTER — Other Ambulatory Visit: Payer: Self-pay | Admitting: Cardiology

## 2011-09-17 LAB — CBC
HCT: 42.8
HCT: 45.4
HCT: 45.8
HCT: 45.8
Hemoglobin: 15
Hemoglobin: 15.2
Hemoglobin: 15.9
Hemoglobin: 15.9
MCHC: 34.5
MCHC: 34.9
MCHC: 35.1
MCV: 91.1
MCV: 91.9
MCV: 92.4
MCV: 92.7
MCV: 93.3
Platelets: 166
Platelets: 174
Platelets: 189
RBC: 4.66
RBC: 4.74
RBC: 4.99
RBC: 5.03
RDW: 13.2
WBC: 7.9
WBC: 8.3
WBC: 8.4
WBC: 8.5
WBC: 8.6

## 2011-09-17 LAB — COMPREHENSIVE METABOLIC PANEL
ALT: 13
AST: 16
Alkaline Phosphatase: 61
CO2: 24
Glucose, Bld: 176 — ABNORMAL HIGH
Potassium: 3.9
Sodium: 139
Total Protein: 6

## 2011-09-17 LAB — BASIC METABOLIC PANEL
BUN: 14
Chloride: 102
Chloride: 104
Creatinine, Ser: 0.95
GFR calc Af Amer: >60
GFR calc non Af Amer: >60
Glucose, Bld: 181 — ABNORMAL HIGH
Potassium: 4.1
Sodium: 140

## 2011-09-17 LAB — POCT I-STAT, CHEM 8
BUN: 14
Hemoglobin: 16.3
Potassium: 4.1
Sodium: 139
TCO2: 22

## 2011-09-17 LAB — POCT CARDIAC MARKERS
Operator id: 284251
Troponin i, poc: 0.06 — ABNORMAL HIGH

## 2011-09-17 LAB — DIFFERENTIAL
Eosinophils Absolute: 0.2
Eosinophils Relative: 2
Lymphocytes Relative: 21
Lymphs Abs: 1.8
Monocytes Relative: 5

## 2011-09-17 LAB — CARDIAC PANEL(CRET KIN+CKTOT+MB+TROPI)
CK, MB: 3.4
Total CK: 76
Total CK: 95

## 2011-09-17 LAB — HEPARIN LEVEL (UNFRACTIONATED)
Heparin Unfractionated: 0.44
Heparin Unfractionated: 0.65
Heparin Unfractionated: <0.1 — ABNORMAL LOW

## 2011-09-17 LAB — LIPID PANEL
Cholesterol: 123
Total CHOL/HDL Ratio: 6.2

## 2011-09-17 LAB — CK TOTAL AND CKMB (NOT AT ARMC)
CK, MB: 3.6
Relative Index: 3.3 — ABNORMAL HIGH
Total CK: 108

## 2011-09-17 LAB — HEMOGLOBIN A1C
Hgb A1c MFr Bld: 8.7 — ABNORMAL HIGH
Mean Plasma Glucose: 232

## 2011-09-17 LAB — PROTIME-INR
INR: 0.9
Prothrombin Time: 12.6

## 2011-09-22 ENCOUNTER — Other Ambulatory Visit: Payer: Self-pay | Admitting: Cardiology

## 2011-09-22 LAB — HEMOGLOBIN A1C: Mean Plasma Glucose: 174

## 2011-09-22 LAB — COMPREHENSIVE METABOLIC PANEL
ALT: 18
AST: 20
Albumin: 3.6
Alkaline Phosphatase: 65
BUN: 15
Chloride: 99
Potassium: 3.8
Sodium: 134 — ABNORMAL LOW
Total Bilirubin: 0.7
Total Protein: 6.5

## 2011-09-22 LAB — CBC
HCT: 44.8
Platelets: 186
Platelets: 170
RDW: 13.1
WBC: 14.3 — ABNORMAL HIGH
WBC: 14.7 — ABNORMAL HIGH

## 2011-09-22 LAB — GLUCOSE, CAPILLARY
Glucose-Capillary: 165 — ABNORMAL HIGH
Glucose-Capillary: 196 — ABNORMAL HIGH
Glucose-Capillary: 223 — ABNORMAL HIGH

## 2011-09-22 LAB — DIFFERENTIAL
Basophils Absolute: 0
Basophils Relative: 0
Eosinophils Absolute: 0.2
Eosinophils Relative: 1
Monocytes Absolute: 1
Monocytes Relative: 7
Neutro Abs: 11.4 — ABNORMAL HIGH

## 2011-09-22 LAB — CARDIAC PANEL(CRET KIN+CKTOT+MB+TROPI)
CK, MB: 2.6
CK, MB: 2.7
Relative Index: 2.5
Total CK: 107
Total CK: 107
Troponin I: 0.03

## 2011-09-22 LAB — POCT CARDIAC MARKERS
CKMB, poc: 1.1
CKMB, poc: 1.6
Myoglobin, poc: 86.2
Myoglobin, poc: 98.3
Troponin i, poc: <0.05
Troponin i, poc: <0.05

## 2011-09-22 LAB — MAGNESIUM: Magnesium: 1.9

## 2011-09-22 LAB — BASIC METABOLIC PANEL
Chloride: 104
GFR calc Af Amer: >60
Potassium: 4.5

## 2011-09-22 LAB — PROTIME-INR: INR: 1

## 2011-09-22 LAB — T4, FREE: Free T4: 1.08

## 2011-09-22 LAB — LIPID PANEL: VLDL: 18

## 2011-09-22 LAB — TSH: TSH: 0.821

## 2011-10-18 ENCOUNTER — Observation Stay (HOSPITAL_COMMUNITY)
Admission: EM | Admit: 2011-10-18 | Discharge: 2011-10-19 | Disposition: A | Discharge status: Home / Self Care | Payer: Federal, State, Local not specified - PPO | Attending: Internal Medicine | Admitting: Internal Medicine

## 2011-10-18 ENCOUNTER — Emergency Department (HOSPITAL_COMMUNITY): Payer: Federal, State, Local not specified - PPO

## 2011-10-18 DIAGNOSIS — F17200 Nicotine dependence, unspecified, uncomplicated: Secondary | ICD-10-CM | POA: Insufficient documentation

## 2011-10-18 DIAGNOSIS — R1013 Epigastric pain: Secondary | ICD-10-CM | POA: Insufficient documentation

## 2011-10-18 DIAGNOSIS — Z951 Presence of aortocoronary bypass graft: Secondary | ICD-10-CM | POA: Insufficient documentation

## 2011-10-18 DIAGNOSIS — R079 Chest pain, unspecified: Secondary | ICD-10-CM

## 2011-10-18 DIAGNOSIS — I10 Essential (primary) hypertension: Secondary | ICD-10-CM | POA: Insufficient documentation

## 2011-10-18 DIAGNOSIS — I2510 Atherosclerotic heart disease of native coronary artery without angina pectoris: Secondary | ICD-10-CM | POA: Insufficient documentation

## 2011-10-18 DIAGNOSIS — E785 Hyperlipidemia, unspecified: Secondary | ICD-10-CM | POA: Insufficient documentation

## 2011-10-18 DIAGNOSIS — I739 Peripheral vascular disease, unspecified: Secondary | ICD-10-CM | POA: Insufficient documentation

## 2011-10-18 DIAGNOSIS — E119 Type 2 diabetes mellitus without complications: Secondary | ICD-10-CM | POA: Insufficient documentation

## 2011-10-18 DIAGNOSIS — R0789 Other chest pain: Principal | ICD-10-CM | POA: Insufficient documentation

## 2011-10-18 DIAGNOSIS — I2589 Other forms of chronic ischemic heart disease: Secondary | ICD-10-CM | POA: Insufficient documentation

## 2011-10-18 DIAGNOSIS — I4891 Unspecified atrial fibrillation: Secondary | ICD-10-CM | POA: Insufficient documentation

## 2011-10-18 LAB — COMPREHENSIVE METABOLIC PANEL
AST: 12 U/L (ref 0–37)
BUN: 25 mg/dL — ABNORMAL HIGH (ref 6–23)
CO2: 23 mEq/L (ref 19–32)
Calcium: 9.4 mg/dL (ref 8.4–10.5)
Creatinine, Ser: 1.23 mg/dL (ref 0.50–1.35)
GFR calc Af Amer: 70 mL/min — ABNORMAL LOW (ref >90)
GFR calc non Af Amer: 60 mL/min — ABNORMAL LOW (ref >90)
Glucose, Bld: 506 mg/dL — ABNORMAL HIGH (ref 70–99)

## 2011-10-18 LAB — DIGOXIN LEVEL: Digoxin Level: 0.5 ng/mL — ABNORMAL LOW (ref 0.8–2.0)

## 2011-10-18 LAB — PRO B NATRIURETIC PEPTIDE: Pro B Natriuretic peptide (BNP): 1549 pg/mL — ABNORMAL HIGH (ref 0–125)

## 2011-10-18 LAB — LIPASE, BLOOD: Lipase: 28 U/L (ref 11–59)

## 2011-10-18 LAB — CBC
HCT: 39.9 % (ref 39.0–52.0)
MCHC: 35.3 g/dL (ref 30.0–36.0)
RDW: 12.2 % (ref 11.5–15.5)

## 2011-10-18 LAB — PROTIME-INR: INR: 1.3 (ref 0.00–1.49)

## 2011-10-18 LAB — DIFFERENTIAL
Basophils Absolute: 0.1 10*3/uL (ref 0.0–0.1)
Eosinophils Relative: 3 % (ref 0–5)
Lymphocytes Relative: 22 % (ref 12–46)
Monocytes Absolute: 0.5 10*3/uL (ref 0.1–1.0)

## 2011-10-18 LAB — GLUCOSE, CAPILLARY: Glucose-Capillary: 274 mg/dL — ABNORMAL HIGH (ref 70–99)

## 2011-10-19 LAB — CARDIAC PANEL(CRET KIN+CKTOT+MB+TROPI)
Relative Index: INVALID (ref 0.0–2.5)
Troponin I: <0.3 ng/mL (ref <0.30)

## 2011-10-20 LAB — GLUCOSE, CAPILLARY: Glucose-Capillary: 285 mg/dL — ABNORMAL HIGH (ref 70–99)

## 2011-10-23 NOTE — H&P (Signed)
[name], [name] NO.:  [id number]  MEDICAL RECORD NO.:  [id number]  LOCATION:  [facility]                         FACILITY:  [facility]  PHYSICIAN:  Hillis Range, MD       DATE OF BIRTH:  1946/02/15  DATE OF ADMISSION:  10/18/2011 DATE OF DISCHARGE:                             HISTORY & PHYSICAL   CHIEF COMPLAINT:  Epigastric pain.  HISTORY OF PRESENT ILLNESS:  [name] is a pleasant 65 year old gentleman with a history of known extensive coronary artery disease, persistent atrial fibrillation, ischemic cardiomyopathy, and ongoing tobacco use, who is now admitted with epigastric discomfort.  The patient reports being in his usual state of health until yesterday evening.  He reports taking his evening medicines which included Pradaxa.  He reports having a sensation "the pills were getting stuck in his throat."  He subsequently developed epigastric pain.  He reports that this epigastric discomfort persisted overnight.  This morning, he attempted to work but noticed pain across his shoulder blades bilaterally.  Reports mild shortness of breath.  When the symptoms persisted, he presented to [facility] where he was evaluated.  He received a GI cocktail and symptoms resolved.  Presently he is resting comfortably and is without complaint.  He denies chest pain similar to his prior anginal equivalent, further shortness of breath, palpitations, dizziness, nausea, vomiting, GI bleeding, or any other concerns.  Given his cardiac history he is, however, concerned that this "could be thought."  The patient has a known history of extensive coronary artery disease. He is previously status post CABG.  His most recent cath was August 2011.  This revealed a patent LIMA to LAD.  He had an occluded SVG to right coronary artery, felt not to be amenable to revascularization.  He also has a known occluded SVG to the left circumflex from a prior study. Medical management has been  recommended long-term.  He also has an ischemic cardiomyopathy with an ejection fraction of 35% for which, he has [place] heart Association class 2 symptoms.  He appears to have permanent atrial fibrillation for which he is appropriately anticoagulated with Pradaxa.  PAST MEDICAL HISTORY: 1. Extensive coronary artery disease (as above), status post prior     CABG. 2. Ischemic cardiomyopathy (as above). 3. Ongoing tobacco use. 4. Persistent and likely permanent atrial fibrillation. 5. Hypertension. 6. Peripheral vascular disease.  Status post femoral popliteal bypass     in 1995. 7. Diabetes.  MEDICATIONS: 1. Crestor 40 mg daily. 2. Potassium chloride 10 mEq daily. 3. Digoxin 0.125 mg daily. 4. Lasix 20 mg b.i.d. 5. Coreg 12.5 mg b.i.d. 6. Pradaxa 150 mg b.i.d. 7. Lisinopril 10 mg daily. 8. Aspirin 325 mg daily. 9. Metformin 1000 mg b.i.d.  ALLERGIES:  No known drug allergies.  SOCIAL HISTORY:  The patient lives in [place].  He has an ongoing history of tobacco use but says that he will quit "today."  He denies alcohol or drug use.  FAMILY HISTORY:  Notable for coronary artery disease.  REVIEW OF SYSTEMS:  All systems reviewed and negative except as outlined in the HPI above.  PHYSICAL EXAMINATION:  TELEMETRY:  Reveals atrial fibrillation with a  controlled ventricular rate. VITAL SIGNS:  Blood pressure 114/47, heart rate 69, respirations 21, and sats 98% on room air. GENERAL:  The patient is a well-appearing male, in no acute distress. He is alert and oriented x3. HEENT:  Normocephalic and atraumatic.  Sclerae clear.  Conjunctivae pink.  Oropharynx clear.  NECK:  Supple. LUNGS:  Clear. HEART:  Irregularly irregular rhythm.  No murmurs, rubs, or gallops. GI:  Soft, nontender, nondistended.  Positive bowel sounds. EXTREMITIES:  No clubbing, cyanosis.  He does have trace pedal edema Bilaterally. SKIN:  No ecchymoses or lacerations. MUSCULOSKELETAL:  No deformity  or atrophy. PSYCH:  Euthymic mood.  Full affect.  DIAGNOSTIC STUDIES: 1. EKG today reveals atrial fibrillation with a ventricular rate of 73     beats per minute with anteroseptal infarction pattern and     nonspecific ST/T-wave changes. 2. Chest x-ray today reveals no acute airspace disease.  LABS:  Troponin is 0, digoxin 0.5, potassium 4.2, creatinine 1.2, INR 1.3, hematocrit 39, platelets 136.  Echocardiogram from January 08, 2011 is reviewed and reveals an ejection fraction of 30-35% with a left ventricular end-diastolic dimension of 49 and with mild left ventricular hypertrophy, mild mitral regurgitation. The left atrial size measures 55 mm.  IMPRESSION:  [name] is a pleasant 65 year old gentleman with a history of known extensive coronary artery disease, ischemic cardiomyopathy, persistent atrial fibrillation, and ongoing tobacco use. He now presents with atypical chest pain.  By history, his pain is most consistent with dyspepsia related to Pradaxa administration.  His symptoms have resolved with a GI cocktail.  Nevertheless the patient is at this point still concern and would like to be observed overnight. Given his extensive cardiac history, I think that overnight observation is reasonable.  We will therefore admit the patient to telemetry and cycle cardiac markers.  If his cardiac markers remained negative, then I would plan to discharge the patient in the morning with no further cardiac risk stratification.  I explained the phenomenon of dyspepsia related to Pradaxa.  I recommended that he take the medicine with food and drink a copious amount of water with medication.  Also offered him Xarelto as an alternative to Pradaxa.  At this time, he made it very clear that he would like to continue his Pradaxa, but will monitor for dyspepsia related to the Medicine in the future.  The importance of smoking cessation was also advised today.  He appears to be on a good medical  regimen for his congestive heart failure, and therefore no empiric changes will be made.  Long term, he will follow up with Dr. Dietrich Pates.  Should he rule in for myocardial infarction, then we may have to adjust the plan.     Hillis Range, MD     JA/MEDQ  D:  10/18/2011  T:  10/18/2011  Job:  [id number]  cc:   Gerrit Friends. Dietrich Pates, MD, [employer]  Electronically Signed by Hillis Range MD on 10/23/2011 02:36:22 PM

## 2011-11-03 ENCOUNTER — Encounter: Payer: BC Managed Care – PPO | Admitting: Adult Health

## 2011-11-28 ENCOUNTER — Encounter: Payer: Self-pay | Admitting: Cardiology

## 2011-12-22 ENCOUNTER — Encounter: Payer: Self-pay | Admitting: Cardiology

## 2011-12-22 ENCOUNTER — Ambulatory Visit (INDEPENDENT_AMBULATORY_CARE_PROVIDER_SITE_OTHER): Payer: BC Managed Care – PPO | Admitting: Cardiology

## 2011-12-22 VITALS — BP 97/67 | HR 78 | Ht 69.5 in | Wt 177.0 lb

## 2011-12-22 DIAGNOSIS — E785 Hyperlipidemia, unspecified: Secondary | ICD-10-CM

## 2011-12-22 DIAGNOSIS — I2510 Atherosclerotic heart disease of native coronary artery without angina pectoris: Secondary | ICD-10-CM

## 2011-12-22 DIAGNOSIS — E119 Type 2 diabetes mellitus without complications: Secondary | ICD-10-CM

## 2011-12-22 DIAGNOSIS — I4891 Unspecified atrial fibrillation: Secondary | ICD-10-CM

## 2011-12-22 DIAGNOSIS — F17200 Nicotine dependence, unspecified, uncomplicated: Secondary | ICD-10-CM

## 2011-12-22 MED ORDER — GLIMEPIRIDE 2 MG PO TABS: 2.0000 mg | ORAL_TABLET | Freq: Every day | ORAL | Status: DC

## 2011-12-22 MED ORDER — CARVEDILOL 25 MG PO TABS: 25.0000 mg | ORAL_TABLET | Freq: Two times a day (BID) | ORAL | Status: DC

## 2011-12-22 MED ORDER — FUROSEMIDE 40 MG PO TABS: 40.0000 mg | ORAL_TABLET | Freq: Every day | ORAL | Status: DC

## 2011-12-22 NOTE — Progress Notes (Signed)
Patient ID: PIUS BYROM, male   DOB: 1945-12-24, 65 y.o.   MRN: [id number] HPI: Return visit is scheduled for continued assessment and treatment of ischemic cardiomyopathy and management of cardiovascular risk factors.  Since his last visit, Mr. Bonne Dolores has done fairly well.  He was admitted to hospital 2 months ago with abdominal discomfort.  At the time of discharge, Aldactone was discontinued and the doses of other cardiac medications with antihypertensive effect were decreased.  He notes occasional mild and fairly brief episodes of chest discomfort for which he has not sought medical evaluation.  He has chronic class II dyspnea on exertion.  Work at the [employer] has been increasingly difficult for him to manage, and he plans to retire in the near future.  He has lost a substantial amount of weight,  most recently 11 pounds over the past 6 months, but has not attempted to restrict caloric intake.  He does not monitor diabetes or hypertension at home.  Prior to Admission medications   Medication Sig Start Date End Date Taking? Authorizing Provider  aspirin 81 MG tablet Take 1 tablet (81 mg total) by mouth daily. 05/08/11 05/07/12 Yes Gerrit Friends. Lavaya Defreitas, MD  digoxin (LANOXIN) 0.125 MG tablet Take 125 mcg by mouth daily.     Yes Historical Provider, MD  hydrocodone-acetaminophen (LORCET-HD) 5-500 MG per capsule Take 1 capsule by mouth every 6 (six) hours as needed.     Yes Historical Provider, MD  lisinopril (PRINIVIL,ZESTRIL) 20 MG tablet TAKE ONE (1) TABLET EACH DAY 07/21/11  Yes Gerrit Friends. Cyrus Ramsburg, MD  metFORMIN (GLUCOPHAGE) 1000 MG tablet TAKE ONE TABLET TWICE DAILY 06/21/11  Yes Gerrit Friends. Julius Matus, MD  nitroGLYCERIN (NITROSTAT) 0.4 MG SL tablet Place 0.4 mg under the tongue every 5 (five) minutes as needed.     Yes Historical Provider, MD  potassium chloride (K-DUR) 10 MEQ tablet TAKE ONE (1) TABLET BY MOUTH EVERY      DAY 09/22/11  Yes Gerrit Friends. Dietrich Pates, MD  PRADAXA 150 MG CAPS TAKE ONE CAPSULE BY  MOUTH TWICE A DAY 06/21/11  Yes Gerrit Friends. Timothee Gali, MD  rosuvastatin (CRESTOR) 40 MG tablet Take 1 tablet (40 mg total) by mouth daily. 08/26/11  Yes [name], NP  carvedilol (COREG) 25 MG tablet Take 1 tablet (25 mg total) by mouth 2 (two) times daily. 12/22/11 12/21/12  Gerrit Friends. Jannae Fagerstrom, MD  furosemide (LASIX) 40 MG tablet Take 1 tablet (40 mg total) by mouth daily. 12/22/11 12/21/12  Gerrit Friends. Dietrich Pates, MD  glimepiride (AMARYL) 2 MG tablet Take 1 tablet (2 mg total) by mouth daily before breakfast. 12/22/11   Gerrit Friends. Spring San, MD  spironolactone (ALDACTONE) 25 MG tablet Take 25 mg by mouth daily.      Historical Provider, MD    No Known Allergies    Past medical history, social history, and family history reviewed and updated.  ROS: Denies orthopnea, PND, lightheadedness, syncope, palpitations or pedal edema.  PHYSICAL EXAM: BP 97/67  Pulse 78  Ht 5' 9.5" (1.765 m)  Wt 80.287 kg (177 lb)  BMI 25.76 kg/m2; weight is 11 pounds less then on 05/08/11. General-Well developed; no acute distress Body habitus-proportionate weight and height Neck-No JVD; no carotid bruits Lungs-clear lung fields; resonant to percussion Cardiovascular-normal PMI; normal S1 and S2 Abdomen-normal bowel sounds; soft and non-tender without masses or organomegaly Musculoskeletal-No deformities, no cyanosis or clubbing Neurologic-Normal cranial nerves; symmetric strength and tone Skin-Warm, no significant lesions Extremities-distal pulses intact; no edema  EKG: Atrial  fibrillation with controlled ventricular response and a ventricular rate of 87 bpm; prior lateral myocardial infarction; low voltage in the limb leads; prior anterior myocardial infarction.  No previous tracing for comparison.  ASSESSMENT AND PLAN:  Geneseo Bing, MD 12/22/2011 4:26 PM

## 2011-12-22 NOTE — Assessment & Plan Note (Signed)
Patient is doing fairly well following remote CABG and multiple percutaneous interventions for coronary artery disease.  He has chronic class II dyspnea on exertion and occasional episodes of atypical chest discomfort unrelated to exertion.

## 2011-12-22 NOTE — Patient Instructions (Signed)
Your physician recommends that you schedule a follow-up appointment in: 4 months  Your physician has recommended you make the following change in your medication:  1 - Increase Coreg to 25 mg twice a day 2 - Change lasix (furosemide) to 40 mg daily 3 - Resume glimepiride 2 mg daily  Your physician recommends that you return for lab work in: This week  Stools x 3 and return asap to office

## 2011-12-22 NOTE — Assessment & Plan Note (Signed)
Random blood glucose measurements were as high as 500 while patient was hospitalized.  Amaryl will be resumed and patient referred back to Dr. [name] for further attention to control of diabetes.  Hemoglobin A1c level is pending.

## 2011-12-22 NOTE — Assessment & Plan Note (Signed)
Lipid profile was superb a year ago.  Repeat measurements will be obtained.

## 2011-12-30 ENCOUNTER — Other Ambulatory Visit: Payer: Self-pay

## 2011-12-30 ENCOUNTER — Emergency Department (HOSPITAL_COMMUNITY): Payer: Federal, State, Local not specified - PPO

## 2011-12-30 ENCOUNTER — Emergency Department (HOSPITAL_COMMUNITY)
Admission: EM | Admit: 2011-12-30 | Discharge: 2011-12-30 | Disposition: A | Discharge status: Home / Self Care | Payer: Federal, State, Local not specified - PPO | Attending: Emergency Medicine | Admitting: Emergency Medicine

## 2011-12-30 ENCOUNTER — Encounter (HOSPITAL_COMMUNITY): Payer: Self-pay

## 2011-12-30 DIAGNOSIS — I509 Heart failure, unspecified: Secondary | ICD-10-CM | POA: Insufficient documentation

## 2011-12-30 DIAGNOSIS — R739 Hyperglycemia, unspecified: Secondary | ICD-10-CM

## 2011-12-30 DIAGNOSIS — F1021 Alcohol dependence, in remission: Secondary | ICD-10-CM | POA: Insufficient documentation

## 2011-12-30 DIAGNOSIS — I4891 Unspecified atrial fibrillation: Secondary | ICD-10-CM | POA: Insufficient documentation

## 2011-12-30 DIAGNOSIS — M5137 Other intervertebral disc degeneration, lumbosacral region: Secondary | ICD-10-CM | POA: Insufficient documentation

## 2011-12-30 DIAGNOSIS — M51379 Other intervertebral disc degeneration, lumbosacral region without mention of lumbar back pain or lower extremity pain: Secondary | ICD-10-CM | POA: Insufficient documentation

## 2011-12-30 DIAGNOSIS — F17200 Nicotine dependence, unspecified, uncomplicated: Secondary | ICD-10-CM | POA: Insufficient documentation

## 2011-12-30 DIAGNOSIS — Z981 Arthrodesis status: Secondary | ICD-10-CM | POA: Insufficient documentation

## 2011-12-30 DIAGNOSIS — E785 Hyperlipidemia, unspecified: Secondary | ICD-10-CM | POA: Insufficient documentation

## 2011-12-30 DIAGNOSIS — Z7982 Long term (current) use of aspirin: Secondary | ICD-10-CM | POA: Insufficient documentation

## 2011-12-30 DIAGNOSIS — R0789 Other chest pain: Secondary | ICD-10-CM | POA: Insufficient documentation

## 2011-12-30 DIAGNOSIS — E119 Type 2 diabetes mellitus without complications: Secondary | ICD-10-CM | POA: Insufficient documentation

## 2011-12-30 DIAGNOSIS — I739 Peripheral vascular disease, unspecified: Secondary | ICD-10-CM | POA: Insufficient documentation

## 2011-12-30 DIAGNOSIS — I2510 Atherosclerotic heart disease of native coronary artery without angina pectoris: Secondary | ICD-10-CM | POA: Insufficient documentation

## 2011-12-30 LAB — CBC
HCT: 44.8 % (ref 39.0–52.0)
MCHC: 35.3 g/dL (ref 30.0–36.0)
MCV: 90.3 fL (ref 78.0–100.0)
RDW: 12.6 % (ref 11.5–15.5)

## 2011-12-30 LAB — BASIC METABOLIC PANEL
CO2: 27 mEq/L (ref 19–32)
Calcium: 10.1 mg/dL (ref 8.4–10.5)
Creatinine, Ser: 1.26 mg/dL (ref 0.50–1.35)

## 2011-12-30 LAB — TROPONIN I: Troponin I: <0.3 ng/mL (ref <0.30)

## 2011-12-30 LAB — DIFFERENTIAL
Basophils Absolute: 0.1 10*3/uL (ref 0.0–0.1)
Basophils Relative: 1 % (ref 0–1)
Eosinophils Relative: 3 % (ref 0–5)
Lymphocytes Relative: 29 % (ref 12–46)
Monocytes Absolute: 0.6 10*3/uL (ref 0.1–1.0)

## 2011-12-30 LAB — GLUCOSE, CAPILLARY: Glucose-Capillary: 306 mg/dL — ABNORMAL HIGH (ref 70–99)

## 2011-12-30 MED ORDER — ASPIRIN 81 MG PO CHEW
324.0000 mg | CHEWABLE_TABLET | Freq: Once | ORAL | Status: AC
  Administered 2011-12-30: 324 mg via ORAL
  Filled 2011-12-30: qty 4

## 2011-12-30 MED ORDER — INSULIN ASPART 100 UNIT/ML ~~LOC~~ SOLN
8.0000 [IU] | Freq: Once | SUBCUTANEOUS | Status: AC
  Administered 2011-12-30: 8 [IU] via INTRAVENOUS

## 2011-12-30 MED ORDER — NITROGLYCERIN 2 % TD OINT
TOPICAL_OINTMENT | TRANSDERMAL | Status: AC
  Filled 2011-12-30: qty 1

## 2011-12-30 MED ORDER — NITROGLYCERIN 2 % TD OINT
1.0000 [in_us] | TOPICAL_OINTMENT | Freq: Once | TRANSDERMAL | Status: AC
  Administered 2011-12-30: 04:00:00 via TOPICAL
  Filled 2011-12-30: qty 30

## 2011-12-30 MED ORDER — INSULIN ASPART 100 UNIT/ML ~~LOC~~ SOLN
SUBCUTANEOUS | Status: AC
  Filled 2011-12-30: qty 3

## 2011-12-30 NOTE — ED Notes (Signed)
Patient with no complaints at this time. Respirations even and unlabored. Skin warm/dry. Discharge instructions reviewed with patient at this time. Patient given opportunity to voice concerns/ask questions. IV removed per policy and band-aid applied to site. Patient discharged at this time and left Emergency Department with steady gait.  

## 2011-12-30 NOTE — ED Notes (Signed)
Mid sternal cp since 12:30 tonight, took 1 sl ntg w. Some relief.  Denies any n/v or fever. +cough, +sob

## 2011-12-30 NOTE — ED Notes (Signed)
Nitropaste removed from chest prior to discharging pt.

## 2011-12-30 NOTE — ED Provider Notes (Signed)
History     CSN: [id number]  Arrival date & time 12/30/11  0228   First MD Initiated Contact with Patient 12/30/11 [phone]      Chief Complaint  Patient presents with  . Chest Pain    (Consider location/radiation/quality/duration/timing/severity/associated sxs/prior treatment) HPI  Patient relates he started having chest pain about midnight. The pain is located centrally and is described as achy. He states the pain comes and goes. He states deep breathing makes the pain worse. He states he has a chronic cough and he has mild shortness of breath at times. He's had clear rhinorrhea without fever. He states he's had this pain before and has been admitted to the hospital for it. He states the pain right now is a 4 at its worst it was a 5 tonight. He states he took one nitroglycerin with some mild improvement at home. He and his wife relates they are under some stress tonight because their son was arguing with his wife. They relate to her son is now living with him and is separated from his wife.  PCP Dr. [name] Cardiologist Dr. Dietrich Pates  Past Medical History  Diagnosis Date  . ASCVD (arteriosclerotic cardiovascular disease)      Ant. MI in 4/95; PTCA of 90% prox & distal LAD unsuccessful-->  Urgent CABG-4/95 TO Cx; 50% RCA; ant. HK; EF of 50-55%; 08/2008: 3-V disease plus a  95% LIMA stenosis, treated with DES; occlusion of SVG to CX; RCA SVG stenosis--> PCI with DES  2010: in-stent restenosis in the LIMA-->cutting balloon; EF of 35-40%;  07/2010: TO of SVG to     RCA-medical therapy advised; EF of 30-35% by echo in 12/2010  . Paroxysmal a-fib 1995    1995, 2009; bradycardia with beta blocker therapy  . Tobacco abuse      40-50 pack years; currently one pack per day  . Peripheral vascular disease 1995    abdominal aortic obstruction-1995-->vascular surgery  . Hyperlipidemia   . DM (diabetes mellitus)   . DDD (degenerative disc disease)     discectomy and fusion at L4 and L5  . Alcohol use  1999    Excessive alcohol use- quit in 1999  CHF  Past Surgical History  Procedure Date  . Cholecystectomy 1995  . Lumbar fusion     +Discectomy;x2; L4 and L5  . Aorto-femoral bypass graft 1995   Cabg 1995 No family history on file.  History  Substance Use Topics  . Smoking status: Current Everyday Smoker  . Smokeless tobacco: Never Used  . Alcohol Use: No  lives with spouse Lives at home    Review of Systems  All other systems reviewed and are negative.    Allergies  Review of patient's allergies indicates no known allergies.  Home Medications   Current Outpatient Rx  Name Route Sig Dispense Refill  . ASPIRIN 81 MG PO TABS Oral Take 1 tablet (81 mg total) by mouth daily. 30 tablet 3  . CARVEDILOL 25 MG PO TABS Oral Take 1 tablet (25 mg total) by mouth 2 (two) times daily. 180 tablet 3  . DIGOXIN 0.125 MG PO TABS Oral Take 125 mcg by mouth daily.      . FUROSEMIDE 40 MG PO TABS Oral Take 1 tablet (40 mg total) by mouth daily. 90 tablet 3  . GLIMEPIRIDE 2 MG PO TABS Oral Take 1 tablet (2 mg total) by mouth daily before breakfast. 90 tablet 3  . HYDROCODONE-ACETAMINOPHEN 5-500 MG PO CAPS Oral Take 1  capsule by mouth every 6 (six) hours as needed.      Marland Kitchen LISINOPRIL 20 MG PO TABS  TAKE ONE (1) TABLET EACH DAY 30 tablet 5  . METFORMIN HCL 1000 MG PO TABS  TAKE ONE TABLET TWICE DAILY 60 tablet 6  . NITROGLYCERIN 0.4 MG SL SUBL Sublingual Place 0.4 mg under the tongue every 5 (five) minutes as needed.      Marland Kitchen POTASSIUM CHLORIDE ER 10 MEQ PO TBCR  TAKE ONE (1) TABLET BY MOUTH EVERY      DAY 30 tablet 6  . PRADAXA 150 MG PO CAPS  TAKE ONE CAPSULE BY MOUTH TWICE A DAY 60 capsule 6  . ROSUVASTATIN CALCIUM 40 MG PO TABS Oral Take 1 tablet (40 mg total) by mouth daily. 30 tablet 6  . SPIRONOLACTONE 25 MG PO TABS Oral Take 25 mg by mouth daily.        BP 107/58  Pulse 67  Temp(Src) 97.8 F (36.6 C) (Oral)  Resp 20  Ht 5\' 8"  (1.727 m)  Wt 177 lb (80.287 kg)  BMI 26.91 kg/m2   SpO2 99% Vital signs normal    Physical Exam  Nursing note and vitals reviewed. Constitutional: He is oriented to person, place, and time. He appears well-developed and well-nourished.  Non-toxic appearance. He does not appear ill. No distress.  HENT:  Head: Normocephalic and atraumatic.  Right Ear: External ear normal.  Left Ear: External ear normal.  Nose: Nose normal. No mucosal edema or rhinorrhea.  Mouth/Throat: Oropharynx is clear and moist and mucous membranes are normal. No dental abscesses or uvula swelling.  Eyes: Conjunctivae and EOM are normal. Pupils are equal, round, and reactive to light.  Neck: Normal range of motion and full passive range of motion without pain. Neck supple.  Cardiovascular: Normal rate, regular rhythm and normal heart sounds.  Exam reveals no gallop and no friction rub.   No murmur heard. Pulmonary/Chest: Effort normal and breath sounds normal. No respiratory distress. He has no wheezes. He has no rhonchi. He has no rales. He exhibits no tenderness and no crepitus.  Abdominal: Soft. Normal appearance and bowel sounds are normal. He exhibits no distension. There is no tenderness. There is no rebound and no guarding.  Musculoskeletal: Normal range of motion. He exhibits no edema and no tenderness.       Moves all extremities well.   Neurological: He is alert and oriented to person, place, and time. He has normal strength. No cranial nerve deficit.  Skin: Skin is warm, dry and intact. No rash noted. No erythema. No pallor.  Psychiatric: He has a normal mood and affect. His speech is normal and behavior is normal. His mood appears not anxious.    ED Course  Procedures (including critical care time)  Patient given aspirin 324 mg to chew and nitroglycerin 1 inch was placed on his chest  Review of old charts shows he had cardiac cath in 2009 and had 3 stents placed. He had a repeat cath in Aug 2011 showing patent LIMA to LAD, new occluded SVG to RCA and  old occluded SVG to L Cx and he was treated medically. His last EF was 35 %. Dr Dietrich Pates describes him having atypical chest discomfort unrelated to exertion.   Pt relates his pain is better. States he is going to call out of work today and is ready to go home.   When discuss his hyperglycemia he states he never checks his CBG at home. He  was noted to have glucose in 500's by Dr Dietrich Pates in office visits.   Results for orders placed during the hospital encounter of 12/30/11  CBC      Component Value Range   WBC 10.0  4.0 - 10.5 (K/uL)   RBC 4.96  4.22 - 5.81 (MIL/uL)   Hemoglobin 15.8  13.0 - 17.0 (g/dL)   HCT 09.8  11.9 - 14.7 (%)   MCV 90.3  78.0 - 100.0 (fL)   MCH 31.9  26.0 - 34.0 (pg)   MCHC 35.3  30.0 - 36.0 (g/dL)   RDW 82.9  56.2 - 13.0 (%)   Platelets 156  150 - 400 (K/uL)  DIFFERENTIAL      Component Value Range   Neutrophils Relative 61  43 - 77 (%)   Neutro Abs 6.1  1.7 - 7.7 (K/uL)   Lymphocytes Relative 29  12 - 46 (%)   Lymphs Abs 2.9  0.7 - 4.0 (K/uL)   Monocytes Relative 6  3 - 12 (%)   Monocytes Absolute 0.6  0.1 - 1.0 (K/uL)   Eosinophils Relative 3  0 - 5 (%)   Eosinophils Absolute 0.3  0.0 - 0.7 (K/uL)   Basophils Relative 1  0 - 1 (%)   Basophils Absolute 0.1  0.0 - 0.1 (K/uL)  BASIC METABOLIC PANEL      Component Value Range   Sodium 134 (*) 135 - 145 (mEq/L)   Potassium 3.8  3.5 - 5.1 (mEq/L)   Chloride 97  96 - 112 (mEq/L)   CO2 27  19 - 32 (mEq/L)   Glucose, Bld 354 (*) 70 - 99 (mg/dL)   BUN 20  6 - 23 (mg/dL)   Creatinine, Ser 8.65  0.50 - 1.35 (mg/dL)   Calcium 78.4  8.4 - 10.5 (mg/dL)   GFR calc non Af Amer 58 (*) >90 (mL/min)   GFR calc Af Amer 67 (*) >90 (mL/min)  TROPONIN I      Component Value Range   Troponin I <0.30  <0.30 (ng/mL)  POCT I-STAT TROPONIN I      Component Value Range   Troponin i, poc 0.01  0.00 - 0.08 (ng/mL)   Comment 3            Laboratory interpretation all normal except hyperglycemia   Dg Chest Portable 1  View  12/30/2011  *RADIOLOGY REPORT*  Clinical Data: Chest pain.  PORTABLE CHEST - 1 VIEW  Comparison: 10/18/2011.  Findings: Cardiomegaly.  CABG.  No airspace disease.  No effusion. No interval change from 10/18/2011. Monitoring leads are projected over the chest.  IMPRESSION: Cardiomegaly without failure.  CABG.  Original Report Authenticated By: Andreas Newport, M.D.    Date: 12/30/2011  Rate: 86  Rhythm: atrial fibrillation  QRS Axis: normal  Intervals: normal  ST/T Wave abnormalities: normal  Conduction Disutrbances:none  Narrative Interpretation: low voltage  Old EKG Reviewed: unchanged10/27/2012   Diagnoses that have been ruled out:  Diagnoses that are still under consideration:  Final diagnoses:  Chest pain, atypical  Hyperglycemia   Plan discharge  Devoria Albe, MD, [facility]    MDM          Ward Givens, MD 12/30/11 [id number]

## 2012-01-26 ENCOUNTER — Other Ambulatory Visit: Payer: Self-pay | Admitting: Cardiology

## 2012-02-28 ENCOUNTER — Other Ambulatory Visit: Payer: Self-pay | Admitting: Cardiology

## 2012-04-28 ENCOUNTER — Emergency Department (HOSPITAL_COMMUNITY): Payer: Federal, State, Local not specified - PPO

## 2012-04-28 ENCOUNTER — Encounter (HOSPITAL_COMMUNITY): Payer: Self-pay

## 2012-04-28 ENCOUNTER — Other Ambulatory Visit: Payer: Self-pay

## 2012-04-28 ENCOUNTER — Emergency Department (HOSPITAL_COMMUNITY)
Admission: EM | Admit: 2012-04-28 | Discharge: 2012-04-29 | Disposition: A | Discharge status: Home / Self Care | Payer: Federal, State, Local not specified - PPO | Attending: Emergency Medicine | Admitting: Emergency Medicine

## 2012-04-28 DIAGNOSIS — R079 Chest pain, unspecified: Secondary | ICD-10-CM | POA: Insufficient documentation

## 2012-04-28 DIAGNOSIS — I4891 Unspecified atrial fibrillation: Secondary | ICD-10-CM | POA: Insufficient documentation

## 2012-04-28 DIAGNOSIS — I498 Other specified cardiac arrhythmias: Secondary | ICD-10-CM | POA: Insufficient documentation

## 2012-04-28 DIAGNOSIS — I739 Peripheral vascular disease, unspecified: Secondary | ICD-10-CM | POA: Insufficient documentation

## 2012-04-28 DIAGNOSIS — E785 Hyperlipidemia, unspecified: Secondary | ICD-10-CM | POA: Insufficient documentation

## 2012-04-28 DIAGNOSIS — Z7982 Long term (current) use of aspirin: Secondary | ICD-10-CM | POA: Insufficient documentation

## 2012-04-28 DIAGNOSIS — Z79899 Other long term (current) drug therapy: Secondary | ICD-10-CM | POA: Insufficient documentation

## 2012-04-28 DIAGNOSIS — M79609 Pain in unspecified limb: Secondary | ICD-10-CM | POA: Insufficient documentation

## 2012-04-28 DIAGNOSIS — R0602 Shortness of breath: Secondary | ICD-10-CM | POA: Insufficient documentation

## 2012-04-28 DIAGNOSIS — R059 Cough, unspecified: Secondary | ICD-10-CM | POA: Insufficient documentation

## 2012-04-28 DIAGNOSIS — E119 Type 2 diabetes mellitus without complications: Secondary | ICD-10-CM | POA: Insufficient documentation

## 2012-04-28 DIAGNOSIS — I2510 Atherosclerotic heart disease of native coronary artery without angina pectoris: Secondary | ICD-10-CM | POA: Insufficient documentation

## 2012-04-28 DIAGNOSIS — I509 Heart failure, unspecified: Secondary | ICD-10-CM | POA: Insufficient documentation

## 2012-04-28 DIAGNOSIS — R05 Cough: Secondary | ICD-10-CM | POA: Insufficient documentation

## 2012-04-28 DIAGNOSIS — R1310 Dysphagia, unspecified: Secondary | ICD-10-CM | POA: Insufficient documentation

## 2012-04-28 NOTE — ED Notes (Signed)
Chest pain for the past several days, thought it was indigestion but it's getting worse per pt. Have taken some meds that helped my indigestion but it does not make the deep pain go away per pt.

## 2012-04-28 NOTE — ED Provider Notes (Signed)
History   This chart was scribed for [employer]. Colon Branch, MD by Shari Heritage. The patient was seen in room APA05/APA05. Patient's care was started at 2314.     CSN: [id number]  Arrival date & time 04/28/12  2314   First MD Initiated Contact with Patient 04/28/12 2326      Chief Complaint  Patient presents with  . Chest Pain    (Consider location/radiation/quality/duration/timing/severity/associated sxs/prior treatment) Patient is a 66 y.o. male presenting with chest pain. The history is provided by the patient. No language interpreter was used.  Chest Pain   DEUNTAE KOCSIS is a 66 y.o. male who presents to the Emergency Department complaining of moderate to severe, constant chest pain radiating to left arm onset 2 days ago associated with SOB, coughing and difficulty swallowing. Patient uses nitroglycerin, but hasn't used it to treat his current Patient denies fever, chills, and nausea. Patient with h/o ASVCD, diabetes, CHF, aorto-femoral bypass graft. Patient is a current smoker.  PCP - Marrion Coy, Cardiologist - [place]  Past Medical History  Diagnosis Date  . ASCVD (arteriosclerotic cardiovascular disease)      Ant. MI in 4/95; PTCA of 90% prox & distal LAD unsuccessful-->  Urgent CABG-4/95 TO Cx; 50% RCA; ant. HK; EF of 50-55%; 08/2008: 3-V disease plus a  95% LIMA stenosis, treated with DES; occlusion of SVG to CX; RCA SVG stenosis--> PCI with DES  2010: in-stent restenosis in the LIMA-->cutting balloon; EF of 35-40%;  07/2010: TO of SVG to     RCA-medical therapy advised; EF of 30-35% by echo in 12/2010  . Paroxysmal a-fib 1995    1995, 2009; bradycardia with beta blocker therapy  . Tobacco abuse      40-50 pack years; currently one pack per day  . Peripheral vascular disease 1995    abdominal aortic obstruction-1995-->vascular surgery  . Hyperlipidemia   . DM (diabetes mellitus)   . DDD (degenerative disc disease)     discectomy and fusion at L4 and L5  . Alcohol use 1999   Excessive alcohol use- quit in 1999  . CHF (congestive heart failure)     Past Surgical History  Procedure Date  . Cholecystectomy 1995  . Lumbar fusion     +Discectomy;x2; L4 and L5  . Aorto-femoral bypass graft 1995    History reviewed. No pertinent family history.  History  Substance Use Topics  . Smoking status: Current Everyday Smoker -- 0.5 packs/day  . Smokeless tobacco: Never Used  . Alcohol Use: No      Review of Systems  Cardiovascular: Positive for chest pain.  A complete 10 system review of systems was obtained and all systems are negative except as noted in the HPI and PMH.    Allergies  Review of patient's allergies indicates no known allergies.  Home Medications   Current Outpatient Rx  Name Route Sig Dispense Refill  . ASPIRIN 81 MG PO TABS Oral Take 1 tablet (81 mg total) by mouth daily. 30 tablet 3  . CARVEDILOL 25 MG PO TABS Oral Take 1 tablet (25 mg total) by mouth 2 (two) times daily. 180 tablet 3  . DIGOXIN 0.125 MG PO TABS Oral Take 125 mcg by mouth daily.      . FUROSEMIDE 40 MG PO TABS Oral Take 1 tablet (40 mg total) by mouth daily. 90 tablet 3  . GLIMEPIRIDE 2 MG PO TABS Oral Take 1 tablet (2 mg total) by mouth daily before breakfast. 90 tablet 3  .  LISINOPRIL 20 MG PO TABS  TAKE ONE (1) TABLET EACH DAY 30 tablet 6  . METFORMIN HCL 1000 MG PO TABS  TAKE ONE TABLET TWICE DAILY 60 tablet 6  . POTASSIUM CHLORIDE ER 10 MEQ PO TBCR  TAKE ONE (1) TABLET BY MOUTH EVERY      DAY 30 tablet 6  . PRADAXA 150 MG PO CAPS  TAKE ONE CAPSULE BY MOUTH TWICE A DAY 60 capsule 6  . ROSUVASTATIN CALCIUM 40 MG PO TABS Oral Take 1 tablet (40 mg total) by mouth daily. 30 tablet 6  . SPIRONOLACTONE 25 MG PO TABS Oral Take 25 mg by mouth daily.      Marland Kitchen HYDROCODONE-ACETAMINOPHEN 5-500 MG PO CAPS Oral Take 1 capsule by mouth every 6 (six) hours as needed.      Marland Kitchen NITROGLYCERIN 0.4 MG SL SUBL Sublingual Place 0.4 mg under the tongue every 5 (five) minutes as needed.         BP 135/72  Temp(Src) 97.5 F (36.4 C) (Oral)  Resp 16  Ht 5\' 9"  (1.753 m)  Wt 183 lb (83.008 kg)  BMI 27.02 kg/m2  SpO2 99%  Physical Exam  Constitutional: He is oriented to person, place, and time. He appears well-developed and well-nourished.  HENT:  Head: Normocephalic and atraumatic.  Mouth/Throat: Oropharynx is clear and moist.  Eyes: Pupils are equal, round, and reactive to light.  Cardiovascular: Normal rate and normal heart sounds.   No murmur heard.      Irregularly regular rhythm. Atrial fibrillation  Pulmonary/Chest: Effort normal and breath sounds normal. No respiratory distress. He has no wheezes. He has no rales.       Crackles at the bases Cough that cleared rales  Abdominal: Soft. Bowel sounds are normal. He exhibits no distension. There is no tenderness. There is no rebound.  Musculoskeletal: Normal range of motion. He exhibits no edema.  Neurological: He is alert and oriented to person, place, and time.  Skin: Skin is warm and dry.       Variety of small excoriations to his forearms and ankles    ED Course  Procedures (including critical care time) DIAGNOSTIC STUDIES: Oxygen Saturation is 99% on room air, normal by my interpretation.     Date: 04/28/2012  2319  Rate:77  Rhythm: atrial fibrillation  QRS Axis: normal  Intervals: a fib  ST/T Wave abnormalities: normal  Conduction Disutrbances:a fib  Narrative Interpretation: anterolateral infarct, cited onor before 02/13/11  Old EKG Reviewed: unchanged c/w 12/30/11   COORDINATION OF CARE: 11:30PM - Patient informed of current plan for treatment and evaluation and agrees with plan at this time.   Results for orders placed during the hospital encounter of 04/28/12  CBC      Component Value Range   WBC 8.6  4.0 - 10.5 (K/uL)   RBC 4.57  4.22 - 5.81 (MIL/uL)   Hemoglobin 14.7  13.0 - 17.0 (g/dL)   HCT 40.9  81.1 - 91.4 (%)   MCV 88.8  78.0 - 100.0 (fL)   MCH 32.2  26.0 - 34.0 (pg)   MCHC 36.2 (*)  30.0 - 36.0 (g/dL)   RDW 78.2  95.6 - 21.3 (%)   Platelets 140 (*) 150 - 400 (K/uL)  DIFFERENTIAL      Component Value Range   Neutrophils Relative 59  43 - 77 (%)   Neutro Abs 5.1  1.7 - 7.7 (K/uL)   Lymphocytes Relative 32  12 - 46 (%)   Lymphs  Abs 2.7  0.7 - 4.0 (K/uL)   Monocytes Relative 6  3 - 12 (%)   Monocytes Absolute 0.5  0.1 - 1.0 (K/uL)   Eosinophils Relative 2  0 - 5 (%)   Eosinophils Absolute 0.2  0.0 - 0.7 (K/uL)   Basophils Relative 1  0 - 1 (%)   Basophils Absolute 0.1  0.0 - 0.1 (K/uL)  BASIC METABOLIC PANEL      Component Value Range   Sodium 136  135 - 145 (mEq/L)   Potassium 3.3 (*) 3.5 - 5.1 (mEq/L)   Chloride 96  96 - 112 (mEq/L)   CO2 25  19 - 32 (mEq/L)   Glucose, Bld 356 (*) 70 - 99 (mg/dL)   BUN 19  6 - 23 (mg/dL)   Creatinine, Ser 0.45  0.50 - 1.35 (mg/dL)   Calcium 9.6  8.4 - 40.9 (mg/dL)   GFR calc non Af Amer 70 (*) >90 (mL/min)   GFR calc Af Amer 81 (*) >90 (mL/min)  PROTIME-INR      Component Value Range   Prothrombin Time 18.3 (*) 11.6 - 15.2 (seconds)   INR 1.49  0.00 - 1.49   TROPONIN I      Component Value Range   Troponin I <0.30  <0.30 (ng/mL)   Dg Chest Port 1 View  04/29/2012  *RADIOLOGY REPORT*  Clinical Data: Chest pain; history of diabetes and smoking.  PORTABLE CHEST - 1 VIEW  Comparison: Chest radiograph performed 12/30/2011  Findings: The lungs are relatively well expanded.  Mild vascular congestion is noted, without significant pulmonary edema.  Mild chronically increased interstitial markings are seen.  No pleural effusion or pneumothorax is identified.  The cardiomediastinal silhouette is mildly enlarged; the patient is status post median sternotomy.  No acute osseous abnormalities are identified.  IMPRESSION: Mild vascular congestion and mild cardiomegaly, without significant pulmonary edema.  Original Report Authenticated By: Tonia Ghent, M.D.      MDM  Patient with history of coronary artery disease status post CABG,  stent placement,here with central chest pain for several days it has gotten worse.labs are unremarkable except for an elevated glucose. Troponin is negative, EKG is normal for this patient, and x-ray does not have any acute findings. Patient has been encouraged to use an H2 blocker or proton pump inhibitor and followup with his doctor.Pt stable in ED with no significant deterioration in condition.The patient appears reasonably screened and/or stabilized for discharge and I doubt any other medical condition or other [facility] requiring further screening, evaluation, or treatment in the ED at this time prior to discharge.  I personally performed the services described in this documentation, which was scribed in my presence. The recorded information has been reviewed and considered.   MDM Reviewed: previous chart, nursing note and vitals Reviewed previous: ECG Interpretation: ECG, labs and x-ray           [employer]. Colon Branch, MD 04/29/12 8119

## 2012-04-29 LAB — CBC
MCH: 32.2 pg (ref 26.0–34.0)
MCHC: 36.2 g/dL — ABNORMAL HIGH (ref 30.0–36.0)
RDW: 12.5 % (ref 11.5–15.5)

## 2012-04-29 LAB — DIFFERENTIAL
Basophils Absolute: 0.1 10*3/uL (ref 0.0–0.1)
Basophils Relative: 1 % (ref 0–1)
Eosinophils Absolute: 0.2 10*3/uL (ref 0.0–0.7)
Monocytes Absolute: 0.5 10*3/uL (ref 0.1–1.0)
Neutro Abs: 5.1 10*3/uL (ref 1.7–7.7)
Neutrophils Relative %: 59 % (ref 43–77)

## 2012-04-29 LAB — PROTIME-INR: Prothrombin Time: 18.3 seconds — ABNORMAL HIGH (ref 11.6–15.2)

## 2012-04-29 LAB — BASIC METABOLIC PANEL
BUN: 19 mg/dL (ref 6–23)
Chloride: 96 mEq/L (ref 96–112)
Creatinine, Ser: 1.08 mg/dL (ref 0.50–1.35)
GFR calc Af Amer: 81 mL/min — ABNORMAL LOW (ref >90)
GFR calc non Af Amer: 70 mL/min — ABNORMAL LOW (ref >90)
Potassium: 3.3 mEq/L — ABNORMAL LOW (ref 3.5–5.1)

## 2012-04-29 NOTE — Discharge Instructions (Signed)
Your blood work was normal here tonight with the exception of your sugar which was high. Your number was normal. Your EKG showed atrial fibrillation which is normal for you. Your chest x-ray had no concerning findings. Continue your home medicines. Consider using either Zantac or Pepcid daily for several days. Followup with your doctor.

## 2012-05-04 ENCOUNTER — Other Ambulatory Visit: Payer: Self-pay | Admitting: *Deleted

## 2012-05-04 MED ORDER — ROSUVASTATIN CALCIUM 40 MG PO TABS: 40.0000 mg | ORAL_TABLET | Freq: Every day | ORAL | Status: DC

## 2012-05-21 ENCOUNTER — Other Ambulatory Visit: Payer: Self-pay | Admitting: Cardiology

## 2012-05-21 NOTE — Telephone Encounter (Signed)
Need to confirm doseage on lasix. LMOM/ct

## 2012-05-21 NOTE — Telephone Encounter (Signed)
Pt is out of refills on lasix and needs use to call new rx to [employer], he states that we increased it to 2 pills a day/tmj

## 2012-05-24 ENCOUNTER — Other Ambulatory Visit: Payer: Self-pay | Admitting: *Deleted

## 2012-05-24 MED ORDER — NITROGLYCERIN 0.4 MG SL SUBL: 0.4000 mg | SUBLINGUAL_TABLET | SUBLINGUAL | Status: DC | PRN

## 2012-06-03 ENCOUNTER — Other Ambulatory Visit: Payer: Self-pay | Admitting: Cardiology

## 2012-07-11 ENCOUNTER — Emergency Department (HOSPITAL_COMMUNITY)
Admission: EM | Admit: 2012-07-11 | Discharge: 2012-07-11 | Disposition: A | Discharge status: Home / Self Care | Payer: Medicare Other | Attending: Emergency Medicine | Admitting: Emergency Medicine

## 2012-07-11 ENCOUNTER — Emergency Department (HOSPITAL_COMMUNITY): Payer: Medicare Other

## 2012-07-11 ENCOUNTER — Encounter (HOSPITAL_COMMUNITY): Payer: Self-pay | Admitting: *Deleted

## 2012-07-11 DIAGNOSIS — I739 Peripheral vascular disease, unspecified: Secondary | ICD-10-CM | POA: Insufficient documentation

## 2012-07-11 DIAGNOSIS — R0602 Shortness of breath: Secondary | ICD-10-CM | POA: Insufficient documentation

## 2012-07-11 DIAGNOSIS — F17200 Nicotine dependence, unspecified, uncomplicated: Secondary | ICD-10-CM | POA: Insufficient documentation

## 2012-07-11 DIAGNOSIS — I2510 Atherosclerotic heart disease of native coronary artery without angina pectoris: Secondary | ICD-10-CM | POA: Insufficient documentation

## 2012-07-11 DIAGNOSIS — E785 Hyperlipidemia, unspecified: Secondary | ICD-10-CM | POA: Insufficient documentation

## 2012-07-11 DIAGNOSIS — I4891 Unspecified atrial fibrillation: Secondary | ICD-10-CM

## 2012-07-11 DIAGNOSIS — Z79899 Other long term (current) drug therapy: Secondary | ICD-10-CM | POA: Insufficient documentation

## 2012-07-11 DIAGNOSIS — E119 Type 2 diabetes mellitus without complications: Secondary | ICD-10-CM | POA: Insufficient documentation

## 2012-07-11 DIAGNOSIS — I509 Heart failure, unspecified: Secondary | ICD-10-CM | POA: Insufficient documentation

## 2012-07-11 DIAGNOSIS — R739 Hyperglycemia, unspecified: Secondary | ICD-10-CM

## 2012-07-11 DIAGNOSIS — IMO0002 Reserved for concepts with insufficient information to code with codable children: Secondary | ICD-10-CM | POA: Insufficient documentation

## 2012-07-11 DIAGNOSIS — R079 Chest pain, unspecified: Secondary | ICD-10-CM | POA: Insufficient documentation

## 2012-07-11 LAB — CBC
HCT: 44.7 % (ref 39.0–52.0)
MCHC: 34.7 g/dL (ref 30.0–36.0)
MCV: 91.6 fL (ref 78.0–100.0)
Platelets: 144 10*3/uL — ABNORMAL LOW (ref 150–400)
RDW: 12.4 % (ref 11.5–15.5)
WBC: 10.1 10*3/uL (ref 4.0–10.5)

## 2012-07-11 LAB — BASIC METABOLIC PANEL
BUN: 11 mg/dL (ref 6–23)
Calcium: 9.7 mg/dL (ref 8.4–10.5)
Chloride: 96 mEq/L (ref 96–112)
Creatinine, Ser: 0.9 mg/dL (ref 0.50–1.35)
GFR calc Af Amer: >90 mL/min (ref >90)
GFR calc non Af Amer: 87 mL/min — ABNORMAL LOW (ref >90)

## 2012-07-11 LAB — PRO B NATRIURETIC PEPTIDE: Pro B Natriuretic peptide (BNP): 2118 pg/mL — ABNORMAL HIGH (ref 0–125)

## 2012-07-11 LAB — POCT I-STAT TROPONIN I: Troponin i, poc: 0.02 ng/mL (ref 0.00–0.08)

## 2012-07-11 MED ORDER — ASPIRIN 81 MG PO CHEW
324.0000 mg | CHEWABLE_TABLET | Freq: Once | ORAL | Status: AC
  Administered 2012-07-11: 324 mg via ORAL
  Filled 2012-07-11: qty 4

## 2012-07-11 MED ORDER — GI COCKTAIL ~~LOC~~
30.0000 mL | Freq: Once | ORAL | Status: AC
  Administered 2012-07-11: 30 mL via ORAL
  Filled 2012-07-11: qty 30

## 2012-07-11 NOTE — ED Provider Notes (Signed)
History     CSN: [id number]  Arrival date & time 07/11/12  7829   First MD Initiated Contact with Patient 07/11/12 [phone]      Chief Complaint  Patient presents with  . Chest Pain     Patient is a 66 y.o. male presenting with chest pain. The history is provided by the patient.  Chest Pain The chest pain began 2 days ago. Episode Length: several hours. Chest pain occurs intermittently. The chest pain is improving. Associated with: nothing. The severity of the pain is moderate. The quality of the pain is described as burning. The pain does not radiate. Exacerbated by: nothing. Primary symptoms include shortness of breath. Pertinent negatives for primary symptoms include no fever, no cough, no abdominal pain and no vomiting.  Pertinent negatives for associated symptoms include no diaphoresis. He tried antacids for the symptoms.   pt reports chest burning/indigestion for past two days He has taken TUMS with some relief This episode of pain has been present for several hours  He reports mild shortness of breath No vomiting He denies syncope He is taking his meds as prescribed He does not recall having this pain recently He takes pradaxa for afib  Past Medical History  Diagnosis Date  . ASCVD (arteriosclerotic cardiovascular disease)      Ant. MI in 4/95; PTCA of 90% prox & distal LAD unsuccessful-->  Urgent CABG-4/95 TO Cx; 50% RCA; ant. HK; EF of 50-55%; 08/2008: 3-V disease plus a  95% LIMA stenosis, treated with DES; occlusion of SVG to CX; RCA SVG stenosis--> PCI with DES  2010: in-stent restenosis in the LIMA-->cutting balloon; EF of 35-40%;  07/2010: TO of SVG to     RCA-medical therapy advised; EF of 30-35% by echo in 12/2010  . Paroxysmal a-fib 1995    1995, 2009; bradycardia with beta blocker therapy  . Tobacco abuse      40-50 pack years; currently one pack per day  . Peripheral vascular disease 1995    abdominal aortic obstruction-1995-->vascular surgery  . Hyperlipidemia     . DM (diabetes mellitus)   . DDD (degenerative disc disease)     discectomy and fusion at L4 and L5  . Alcohol use 1999    Excessive alcohol use- quit in 1999  . CHF (congestive heart failure)     Past Surgical History  Procedure Date  . Cholecystectomy 1995  . Lumbar fusion     +Discectomy;x2; L4 and L5  . Aorto-femoral bypass graft 1995    History reviewed. No pertinent family history.  History  Substance Use Topics  . Smoking status: Current Everyday Smoker -- 0.5 packs/day  . Smokeless tobacco: Never Used  . Alcohol Use: No      Review of Systems  Constitutional: Negative for fever and diaphoresis.  Respiratory: Positive for shortness of breath. Negative for cough.   Cardiovascular: Positive for chest pain.  Gastrointestinal: Negative for vomiting and abdominal pain.  All other systems reviewed and are negative.    Allergies  Review of patient's allergies indicates no known allergies.  Home Medications   Current Outpatient Rx  Name Route Sig Dispense Refill  . CARVEDILOL 25 MG PO TABS Oral Take 1 tablet (25 mg total) by mouth 2 (two) times daily. 180 tablet 3  . DIGOXIN 0.125 MG PO TABS Oral Take 125 mcg by mouth daily.      . FUROSEMIDE 40 MG PO TABS Oral Take 1 tablet (40 mg total) by mouth daily. 90 tablet  3  . GLIMEPIRIDE 2 MG PO TABS Oral Take 1 tablet (2 mg total) by mouth daily before breakfast. 90 tablet 3  . HYDROCODONE-ACETAMINOPHEN 5-500 MG PO CAPS Oral Take 1 capsule by mouth every 6 (six) hours as needed.      Marland Kitchen LISINOPRIL 20 MG PO TABS  TAKE ONE (1) TABLET EACH DAY 30 tablet 6  . METFORMIN HCL 1000 MG PO TABS  TAKE ONE TABLET TWICE DAILY 60 tablet 6  . NITROGLYCERIN 0.4 MG SL SUBL Sublingual Place 1 tablet (0.4 mg total) under the tongue every 5 (five) minutes as needed. 25 tablet 6  . POTASSIUM CHLORIDE ER 10 MEQ PO TBCR  TAKE ONE (1) TABLET BY MOUTH EVERY      DAY 30 tablet 6  . PRADAXA 150 MG PO CAPS  TAKE ONE CAPSULE BY MOUTH TWICE A DAY 60  capsule 6  . ROSUVASTATIN CALCIUM 40 MG PO TABS Oral Take 1 tablet (40 mg total) by mouth daily. 30 tablet 2  . SPIRONOLACTONE 25 MG PO TABS Oral Take 25 mg by mouth daily.      . ASPIRIN 81 MG PO TABS Oral Take 1 tablet (81 mg total) by mouth daily. 30 tablet 3    BP 147/78  Pulse 64  Temp 97.7 F (36.5 C) (Oral)  Resp 18  Ht 5\' 9"  (1.753 m)  Wt 187 lb (84.823 kg)  BMI 27.62 kg/m2  SpO2 95%  Physical Exam CONSTITUTIONAL: Well developed/well nourished HEAD AND FACE: Normocephalic/atraumatic EYES: EOMI/PERRL ENMT: Mucous membranes moist NECK: supple no meningeal signs SPINE:entire spine nontender CV: irregular, no loud/harsh murmurs LUNGS:  no apparent distress ABDOMEN: soft, nontender, no rebound or guarding GU:no cva tenderness NEURO: Pt is awake/alert, moves all extremitiesx4 EXTREMITIES: pulses normal, full ROM SKIN: warm, color normal PSYCH: no abnormalities of mood noted  ED Course  Procedures   Labs Reviewed  CBC - Abnormal; Notable for the following:    Platelets 144 (*)     All other components within normal limits  BASIC METABOLIC PANEL - Abnormal; Notable for the following:    Sodium 132 (*)     Glucose, Bld 357 (*)     GFR calc non Af Amer 87 (*)     All other components within normal limits  PRO B NATRIURETIC PEPTIDE - Abnormal; Notable for the following:    Pro B Natriuretic peptide (BNP) 2118.0 (*)     All other components within normal limits  DIGOXIN LEVEL - Abnormal; Notable for the following:    Digoxin Level 0.4 (*)     All other components within normal limits  TROPONIN I  POCT I-STAT TROPONIN I   Chest Portable 1 View  07/11/2012  *RADIOLOGY REPORT*  Clinical Data: Chest pain.  PORTABLE CHEST - 1 VIEW  Comparison: 04/28/2012  Findings: Postoperative changes in the mediastinum, stable.  Mild cardiac enlargement with mild vascular congestion similar to previous study.  Interstitial changes in the lungs are stable and likely represent fibrosis.   No definite evidence of edema or consolidation.  No blunting of costophrenic angles.  No pneumothorax.  Emphysematous changes in the upper lungs.  IMPRESSION: Mild cardiac enlargement and pulmonary vascular congestion. 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  No active consolidation.  Original Report Authenticated By: Marlon Pel, M.D.   4:49 AM Pt well appearing/nontoxic, watching TV Reports some chest burning Initial cardiac workup negative Will treat symptoms and reasses 5:34 AM Pt well appearing Ambulatory, no cp/sob/weakness/dizziness reported on  ambulation Doubt acute chf (elevated bnp) Reports indigestion/burning type pain though not specifically related to food My suspicion after observation in the ED for ACS/ischemia is lowered We will recheck repeat troponin/ekg at 0630 which is 3 hrs after first test If unchanged, will be stable for d/c home and close cardiology followup I doubt PE/dissection at this time (pain not pleuritic/does not radiate) No abdominal symptoms noted 6:31 AM Repeat EKG shows no significant change  Repeat troponin negative Patient resting comfortably, no distress.  Stable for d/c   MDM  Nursing notes including past medical history and social history reviewed and considered in documentation xrays reviewed and considered All labs/vitals reviewed and considered Previous records reviewed and considered - h/o afib, on pradaxa, h/o cardiomyopathy. H/o dyspepsia H/o CAD with last cath in 2011 - RCA vein graft occluded but medical therapy advised        Date: 07/11/2012  Rate: 73  Rhythm: atrial fibrillation  QRS Axis: left  Intervals: normal  ST/T Wave abnormalities: nonspecific ST changes  Conduction Disutrbances:none  Narrative Interpretation: low voltage similar to prior  Old EKG Reviewed: unchanged    Joya Gaskins, MD 07/11/12 [phone]

## 2012-07-11 NOTE — ED Notes (Signed)
Patient ambulated around nurses' station x 2.  O2 sats remained at 95%.  Patient speaking in complete sentences while walking.  Patient with no complaints of chest pain while walking.  Patient returned to room and placed back on cardiac monitor; atrial fibrillation noted on monitor.

## 2012-07-11 NOTE — ED Notes (Signed)
Pt reporting intermittent pain for last 2 days.  Reports he thought it was indigestion and has been taking Tums.  Some relief, but none tonight. Pt states he has not taken any nitro or ASA.

## 2012-08-26 ENCOUNTER — Other Ambulatory Visit: Payer: Self-pay | Admitting: Cardiology

## 2012-10-29 ENCOUNTER — Other Ambulatory Visit: Payer: Self-pay | Admitting: Cardiology

## 2012-11-20 ENCOUNTER — Other Ambulatory Visit: Payer: Self-pay | Admitting: Cardiology

## 2012-11-26 ENCOUNTER — Other Ambulatory Visit: Payer: Self-pay | Admitting: Cardiology

## 2013-01-24 ENCOUNTER — Other Ambulatory Visit: Payer: Self-pay | Admitting: Cardiology

## 2013-02-13 ENCOUNTER — Other Ambulatory Visit: Payer: Self-pay | Admitting: Cardiology

## 2013-03-23 ENCOUNTER — Other Ambulatory Visit: Payer: Self-pay | Admitting: Cardiology

## 2013-03-23 NOTE — Telephone Encounter (Signed)
Message left for patient to make a follow up appointment/ Latrelle Dodrill, RN

## 2013-04-13 ENCOUNTER — Encounter: Payer: Self-pay | Admitting: Cardiology

## 2013-04-13 ENCOUNTER — Ambulatory Visit (INDEPENDENT_AMBULATORY_CARE_PROVIDER_SITE_OTHER): Payer: Medicare Other | Admitting: Cardiology

## 2013-04-13 ENCOUNTER — Telehealth: Payer: Self-pay | Admitting: *Deleted

## 2013-04-13 VITALS — BP 123/78 | HR 65 | Ht 69.5 in | Wt 177.1 lb

## 2013-04-13 DIAGNOSIS — I2589 Other forms of chronic ischemic heart disease: Secondary | ICD-10-CM

## 2013-04-13 DIAGNOSIS — I10 Essential (primary) hypertension: Secondary | ICD-10-CM

## 2013-04-13 DIAGNOSIS — F17200 Nicotine dependence, unspecified, uncomplicated: Secondary | ICD-10-CM

## 2013-04-13 DIAGNOSIS — I2510 Atherosclerotic heart disease of native coronary artery without angina pectoris: Secondary | ICD-10-CM

## 2013-04-13 DIAGNOSIS — E785 Hyperlipidemia, unspecified: Secondary | ICD-10-CM

## 2013-04-13 DIAGNOSIS — I4891 Unspecified atrial fibrillation: Secondary | ICD-10-CM

## 2013-04-13 DIAGNOSIS — W19XXXA Unspecified fall, initial encounter: Secondary | ICD-10-CM

## 2013-04-13 DIAGNOSIS — Z9189 Other specified personal risk factors, not elsewhere classified: Secondary | ICD-10-CM

## 2013-04-13 DIAGNOSIS — Z9289 Personal history of other medical treatment: Secondary | ICD-10-CM | POA: Insufficient documentation

## 2013-04-13 DIAGNOSIS — I7090 Unspecified atherosclerosis: Secondary | ICD-10-CM

## 2013-04-13 DIAGNOSIS — Z7901 Long term (current) use of anticoagulants: Secondary | ICD-10-CM | POA: Insufficient documentation

## 2013-04-13 DIAGNOSIS — E119 Type 2 diabetes mellitus without complications: Secondary | ICD-10-CM

## 2013-04-13 DIAGNOSIS — I255 Ischemic cardiomyopathy: Secondary | ICD-10-CM

## 2013-04-13 NOTE — Assessment & Plan Note (Signed)
No acute cardiac issues since 02/2011. Most recent echocardiogram in 12/2010 suggested an EF of 30-35%, representing borderline criteria for AICD implantation.  A repeat study will be obtained to reevaluate this issue.

## 2013-04-13 NOTE — Assessment & Plan Note (Signed)
No recent lipid levels available for review; values are being sought from patient's PCP, Dr. [name].

## 2013-04-13 NOTE — Assessment & Plan Note (Signed)
Fall is of concern. The mechanism of this event is not clear based upon the patient's history, and loss of consciousness is possible. We find no orthostatic change in blood pressure at present. We will await a recurrent event before initiating testing for syncope.

## 2013-04-13 NOTE — Progress Notes (Deleted)
Name: [name]    DOB: Nov 26, 1946  Age: 67 y.o.  MR#: [id number]       PCP:  Alice Reichert, MD      Insurance: Payor: BLUE CROSS BLUE [employer]IELD  Plan: BCBS/[employer] PPO  Product Type: *No Product type*    CC:   No chief complaint on file.  LIST VS Filed Vitals:   04/13/13 1434  BP: 123/78  Pulse: 65  Height: 5' 9.5" (1.765 m)  Weight: 177 lb 1.9 oz (80.341 kg)    Weights Current Weight  04/13/13 177 lb 1.9 oz (80.341 kg)  07/11/12 187 lb (84.823 kg)  04/28/12 183 lb (83.008 kg)    Blood Pressure  BP Readings from Last 3 Encounters:  04/13/13 123/78  07/11/12 154/94  04/29/12 112/66     Admit date:  (Not on file) Last encounter with RMR:  03/23/2013   Allergy Review of patient's allergies indicates no known allergies.  Current Outpatient Prescriptions  Medication Sig Dispense Refill  . carvedilol (COREG) 25 MG tablet TAKE ONE TABLET TWICE DAILY  60 tablet  1  . CRESTOR 40 MG tablet TAKE ONE (1) TABLET BY MOUTH EVERY      DAY  30 each  6  . digoxin (LANOXIN) 0.125 MG tablet Take 125 mcg by mouth daily.        Marland Kitchen glimepiride (AMARYL) 2 MG tablet Take 1 tablet (2 mg total) by mouth daily before breakfast.  90 tablet  3  . hydrocodone-acetaminophen (LORCET-HD) 5-500 MG per capsule Take 1 capsule by mouth every 6 (six) hours as needed.        Marland Kitchen lisinopril (PRINIVIL,ZESTRIL) 20 MG tablet TAKE ONE (1) TABLET EACH DAY  30 tablet  0  . metFORMIN (GLUCOPHAGE) 1000 MG tablet TAKE ONE TABLET TWICE DAILY  60 tablet  6  . nitroGLYCERIN (NITROSTAT) 0.4 MG SL tablet Place 1 tablet (0.4 mg total) under the tongue every 5 (five) minutes as needed.  25 tablet  6  . potassium chloride (K-DUR) 10 MEQ tablet TAKE ONE (1) TABLET BY MOUTH EVERY DAY  30 tablet  6  . PRADAXA 150 MG CAPS TAKE ONE CAPSULE BY MOUTH TWICE A DAY  60 capsule  3  . spironolactone (ALDACTONE) 25 MG tablet Take 25 mg by mouth daily.        Marland Kitchen aspirin 81 MG tablet Take 1 tablet (81 mg total) by mouth daily.  30 tablet   3  . furosemide (LASIX) 40 MG tablet Take 1 tablet (40 mg total) by mouth daily.  90 tablet  3   No current facility-administered medications for this visit.    Discontinued Meds:   There are no discontinued medications.  Patient Active Problem List  Diagnosis  . DIABETES MELLITUS, TYPE II  . HYPERLIPIDEMIA  . TOBACCO ABUSE  . PERIPHERAL VASCULAR DISEASE  . Arteriosclerotic cardiovascular disease (ASCVD)  . Paroxysmal a-fib  . DDD (degenerative disc disease)  . Alcohol use    LABS    Component Value Date/Time   NA 132* 07/11/2012 0330   NA 136 04/28/2012 2341   NA 134* 12/30/2011 0303   K 4.0 07/11/2012 0330   K 3.3* 04/28/2012 2341   K 3.8 12/30/2011 0303   CL 96 07/11/2012 0330   CL 96 04/28/2012 2341   CL 97 12/30/2011 0303   CO2 27 07/11/2012 0330   CO2 25 04/28/2012 2341   CO2 27 12/30/2011 0303   GLUCOSE 357* 07/11/2012 0330  GLUCOSE 356* 04/28/2012 2341   GLUCOSE 354* 12/30/2011 0303   BUN 11 07/11/2012 0330   BUN 19 04/28/2012 2341   BUN 20 12/30/2011 0303   CREATININE 0.90 07/11/2012 0330   CREATININE 1.08 04/28/2012 2341   CREATININE 1.26 12/30/2011 0303   CALCIUM 9.7 07/11/2012 0330   CALCIUM 9.6 04/28/2012 2341   CALCIUM 10.1 12/30/2011 0303   GFRNONAA 87* 07/11/2012 0330   GFRNONAA 70* 04/28/2012 2341   GFRNONAA 58* 12/30/2011 0303   GFRAA >90 07/11/2012 0330   GFRAA 81* 04/28/2012 2341   GFRAA 67* 12/30/2011 0303   CMP     Component Value Date/Time   NA 132* 07/11/2012 0330   K 4.0 07/11/2012 0330   CL 96 07/11/2012 0330   CO2 27 07/11/2012 0330   GLUCOSE 357* 07/11/2012 0330   BUN 11 07/11/2012 0330   CREATININE 0.90 07/11/2012 0330   CALCIUM 9.7 07/11/2012 0330   PROT 6.4 10/18/2011 0940   ALBUMIN 3.5 10/18/2011 0940   AST 12 10/18/2011 0940   ALT 14 10/18/2011 0940   ALKPHOS 49 10/18/2011 0940   BILITOT 0.2* 10/18/2011 0940   GFRNONAA 87* 07/11/2012 0330   GFRAA >90 07/11/2012 0330       Component Value Date/Time   WBC 10.1 07/11/2012 0330   WBC 8.6 04/28/2012 2341   WBC 10.0  12/30/2011 0303   HGB 15.5 07/11/2012 0330   HGB 14.7 04/28/2012 2341   HGB 15.8 12/30/2011 0303   HCT 44.7 07/11/2012 0330   HCT 40.6 04/28/2012 2341   HCT 44.8 12/30/2011 0303   MCV 91.6 07/11/2012 0330   MCV 88.8 04/28/2012 2341   MCV 90.3 12/30/2011 0303    Lipid Panel     Component Value Date/Time   CHOL  Value: 120        ATP III CLASSIFICATION:  <200     mg/dL   Desirable  308-657  mg/dL   Borderline High  >=846    mg/dL   High        9/62/9528 0915   TRIG 172* 08/17/2010 0915   HDL 36* 08/17/2010 0915   CHOLHDL 3.3 08/17/2010 0915   VLDL 34 08/17/2010 0915   LDLCALC  Value: 50        Total Cholesterol/HDL:CHD Risk Coronary Heart Disease Risk Table                     Men   Women  1/2 Average Risk   3.4   3.3  Average Risk       5.0   4.4  2 X Average Risk   9.6   7.1  3 X Average Risk  23.4   11.0        Use the calculated Patient Ratio above and the CHD Risk Table to determine the patient's CHD Risk.        ATP III CLASSIFICATION (LDL):  <100     mg/dL   Optimal  413-244  mg/dL   Near or Above                    Optimal  130-159  mg/dL   Borderline  010-272  mg/dL   High  >536     mg/dL   Very High 6/44/0347 4259    ABG    Component Value Date/Time   TCO2 22 05/12/2008 0956     Lab Results  Component Value Date   TSH 0.546 08/17/2010   BNP (  last 3 results)  Recent Labs  07/11/12 0330  PROBNP 2118.0*   Cardiac Panel (last 3 results) No results found for this basename: CKTOTAL, CKMB, TROPONINI, RELINDX,  in the last 72 hours  Iron/TIBC/Ferritin No results found for this basename: iron, tibc, ferritin     EKG Orders placed during the hospital encounter of 07/11/12  . ED EKG  . ED EKG  . EKG 12-LEAD  . EKG 12-LEAD  . ED EKG  . ED EKG  . EKG 12-LEAD  . EKG 12-LEAD  . EKG     Prior Assessment and Plan Problem List as of 04/13/2013     ICD-9-CM   DIABETES MELLITUS, TYPE II   Last Assessment & Plan   12/22/2011 Office Visit Written 12/22/2011  4:38 PM by Kathlen Brunswick, MD      Random blood glucose measurements were as high as 500 while patient was hospitalized.  Amaryl will be resumed and patient referred back to Dr. [name] for further attention to control of diabetes.  Hemoglobin A1c level is pending.    HYPERLIPIDEMIA   Last Assessment & Plan   12/22/2011 Office Visit Written 12/22/2011  4:40 PM by Kathlen Brunswick, MD     Lipid profile was superb a year ago.  Repeat measurements will be obtained.    TOBACCO ABUSE   PERIPHERAL VASCULAR DISEASE   Last Assessment & Plan   05/08/2011 Office Visit Written 05/08/2011  2:02 PM by Kathlen Brunswick, MD     Although patient has known peripheral vascular disease with previous surgical intervention and has symptoms highly suggestive for claudication, distal pulses are intact.  I do not believe that any further testing or intervention would be useful to him at the present time.    Arteriosclerotic cardiovascular disease (ASCVD)   Last Assessment & Plan   12/22/2011 Office Visit Written 12/22/2011  4:36 PM by Kathlen Brunswick, MD     Patient is doing fairly well following remote CABG and multiple percutaneous interventions for coronary artery disease.  He has chronic class II dyspnea on exertion and occasional episodes of atypical chest discomfort unrelated to exertion.    Paroxysmal a-fib   Last Assessment & Plan   05/08/2011 Office Visit Edited 05/12/2011  7:12 AM by Kathlen Brunswick, MD     Most recent EKG is from February 2012, when atrial fibrillation was present.  He will require lifelong anticoagulation Control of heart rate is adequate with beta blocker and digoxin.    DDD (degenerative disc disease)   Alcohol use       Imaging: No results found.

## 2013-04-13 NOTE — Assessment & Plan Note (Signed)
Patient reports continued suboptimal control of diabetes. A recent assessment of hemoglobin A1c is pending.

## 2013-04-13 NOTE — Assessment & Plan Note (Signed)
Arrhythmia now appears constant, whereas it was initially paroxysmal. Appropriate anticoagulation with a novel oral agent. Patient stopped taking Pradaxa for 2 months when he could not afford it, but has reestablished his eligibility for $10 co-pay. He was cautioned about interrupting this therapy.

## 2013-04-13 NOTE — Assessment & Plan Note (Signed)
Patient reports a reduction to 0.5 pack per day and hopes to discontinue tobacco use entirely.

## 2013-04-13 NOTE — Telephone Encounter (Signed)
FYI: Pt was given d/c instructions at the end of office visit today 04-13-13 however pt declined to complete the hemoccult cards given, reiterated to pt importance of completion, pt declined times two

## 2013-04-13 NOTE — Patient Instructions (Addendum)
Your physician recommends that you schedule a follow-up appointment in: 8 MONTHS  Your physician has requested that you have an echocardiogram. Echocardiography is a painless test that uses sound waves to create images of your heart. It provides your doctor with information about the size and shape of your heart and how well your heart's chambers and valves are working. This procedure takes approximately one hour. There are no restrictions for this procedure.  YOUR PHYSICAN RECOMMENDS THAT YOU COMPLETE THE STOOL CARDS GIVEN TODAY AND BRING BACK INTO THE OFFICE ONCE COMPLETE

## 2013-04-13 NOTE — Progress Notes (Signed)
Patient ID: [name], male   DOB: [birth year], 67 y.o.   MRN: [id number]  HPI: Schedule return visit for this pleasant gentleman with multiple medical problems including chronic lung disease, coronary artery disease and atrial fibrillation. He is walking with a cane, as he fell in his bathroom early one morning while standing at the sink. He does not believe that he lost consciousness, but does not know how he came to lose his balance. He did not suffer any apparent injury. He has not had similar falls nor has experienced lightheadedness or syncope. Activity is limited as the result of orthopedic issues, but he is experiencing no chest discomfort nor dyspnea, and probably has class II symptoms.  He was evaluated in the emergency department in January, May and July of 2013 for chest discomfort, but has not had similar symptoms since.  Current Outpatient Prescriptions  Medication Sig Dispense Refill  . carvedilol (COREG) 25 MG tablet TAKE ONE TABLET TWICE DAILY  60 tablet  1  . CRESTOR 40 MG tablet TAKE ONE (1) TABLET BY MOUTH EVERY      DAY  30 each  6  . digoxin (LANOXIN) 0.125 MG tablet Take 125 mcg by mouth daily.        Marland Kitchen glimepiride (AMARYL) 2 MG tablet Take 1 tablet (2 mg total) by mouth daily before breakfast.  90 tablet  3  . hydrocodone-acetaminophen (LORCET-HD) 5-500 MG per capsule Take 1 capsule by mouth every 6 (six) hours as needed.        Marland Kitchen lisinopril (PRINIVIL,ZESTRIL) 20 MG tablet TAKE ONE (1) TABLET EACH DAY  30 tablet  0  . metFORMIN (GLUCOPHAGE) 1000 MG tablet TAKE ONE TABLET TWICE DAILY  60 tablet  6  . nitroGLYCERIN (NITROSTAT) 0.4 MG SL tablet Place 1 tablet (0.4 mg total) under the tongue every 5 (five) minutes as needed.  25 tablet  6  . potassium chloride (K-DUR) 10 MEQ tablet TAKE ONE (1) TABLET BY MOUTH EVERY DAY  30 tablet  6  . PRADAXA 150 MG CAPS TAKE ONE CAPSULE BY MOUTH TWICE A DAY  60 capsule  3  . spironolactone (ALDACTONE) 25 MG tablet Take 25 mg by mouth daily.         Marland Kitchen aspirin 81 MG tablet Take 1 tablet (81 mg total) by mouth daily.  30 tablet  3  . furosemide (LASIX) 40 MG tablet Take 1 tablet (40 mg total) by mouth daily.  90 tablet  3   No current facility-administered medications for this visit.   No Known Allergies   Past medical history, social history, and family history reviewed and updated.  ROS: Denies chest discomfort, dyspnea except occasionally with exertion, palpitations, lightheadedness, orthopnea, peripheral edema or syncope. His had chronic back problems, but no major neurological orthopedic issues with the lower extremities.  He has not previously undergone colonoscopy, but the advisability of submitting to that procedure is being discussed with his PCP. All other systems reviewed and are negative  PHYSICAL EXAM: BP 123/78  Pulse 65  Ht 5' 9.5" (1.765 m)  Wt 80.341 kg (177 lb 1.9 oz)  BMI 25.79 kg/m2;  Body mass index is 25.79 kg/(m^2). General-Well developed; no acute distress; mildly disheveled; walking with a straight cane Body habitus-proportionate weight and height Neck-No JVD; no carotid bruits Lungs-clear lung fields; resonant to percussion Cardiovascular-normal PMI; normal S1 and S2; irregular rhythm Abdomen-normal bowel sounds; soft and non-tender without masses or organomegaly Musculoskeletal-No deformities, no cyanosis or clubbing Neurologic-Normal  cranial nerves; symmetric strength and tone Skin-Warm, no significant lesions Extremities-distal pulses intact; no edema  EKG: Atrial fibrillation with controlled ventricular response; ventricular rate of 65 bpm; low-voltage; probable prior lateral myocardial infarction; prior anteroseptal myocardial infarction. When compared with a previous tracing performed 07/11/12, there has been no significant interval change.[place], MD 04/13/2013  4:48 PM  ASSESSMENT AND PLAN

## 2013-04-14 ENCOUNTER — Ambulatory Visit (HOSPITAL_COMMUNITY): Payer: Federal, State, Local not specified - PPO | Attending: Cardiology

## 2013-04-17 ENCOUNTER — Encounter: Payer: Self-pay | Admitting: Cardiology

## 2013-04-27 ENCOUNTER — Encounter: Payer: Self-pay | Admitting: Cardiology

## 2013-05-20 ENCOUNTER — Other Ambulatory Visit: Payer: Self-pay | Admitting: Cardiology

## 2013-06-18 ENCOUNTER — Other Ambulatory Visit: Payer: Self-pay | Admitting: Cardiology

## 2013-06-20 NOTE — Telephone Encounter (Signed)
Medication sent via escribe for CARVEDILOL 25MG  TAB.

## 2013-07-29 ENCOUNTER — Other Ambulatory Visit: Payer: Self-pay | Admitting: Cardiology

## 2013-09-06 ENCOUNTER — Encounter (HOSPITAL_COMMUNITY): Payer: Self-pay

## 2013-09-06 ENCOUNTER — Emergency Department (HOSPITAL_COMMUNITY): Payer: Medicare Other

## 2013-09-06 ENCOUNTER — Observation Stay (HOSPITAL_COMMUNITY)
Admission: EM | Admit: 2013-09-06 | Discharge: 2013-09-08 | Disposition: A | Discharge status: Home / Self Care | Payer: Medicare Other | Attending: Family Medicine | Admitting: Family Medicine

## 2013-09-06 DIAGNOSIS — I2510 Atherosclerotic heart disease of native coronary artery without angina pectoris: Secondary | ICD-10-CM | POA: Diagnosis present

## 2013-09-06 DIAGNOSIS — Z794 Long term (current) use of insulin: Secondary | ICD-10-CM | POA: Insufficient documentation

## 2013-09-06 DIAGNOSIS — R0789 Other chest pain: Principal | ICD-10-CM | POA: Insufficient documentation

## 2013-09-06 DIAGNOSIS — E871 Hypo-osmolality and hyponatremia: Secondary | ICD-10-CM | POA: Insufficient documentation

## 2013-09-06 DIAGNOSIS — I739 Peripheral vascular disease, unspecified: Secondary | ICD-10-CM | POA: Insufficient documentation

## 2013-09-06 DIAGNOSIS — R079 Chest pain, unspecified: Secondary | ICD-10-CM | POA: Diagnosis present

## 2013-09-06 DIAGNOSIS — M51379 Other intervertebral disc degeneration, lumbosacral region without mention of lumbar back pain or lower extremity pain: Secondary | ICD-10-CM | POA: Insufficient documentation

## 2013-09-06 DIAGNOSIS — I5023 Acute on chronic systolic (congestive) heart failure: Secondary | ICD-10-CM | POA: Diagnosis present

## 2013-09-06 DIAGNOSIS — Z7982 Long term (current) use of aspirin: Secondary | ICD-10-CM | POA: Insufficient documentation

## 2013-09-06 DIAGNOSIS — E785 Hyperlipidemia, unspecified: Secondary | ICD-10-CM | POA: Diagnosis present

## 2013-09-06 DIAGNOSIS — Z79899 Other long term (current) drug therapy: Secondary | ICD-10-CM | POA: Insufficient documentation

## 2013-09-06 DIAGNOSIS — M5137 Other intervertebral disc degeneration, lumbosacral region: Secondary | ICD-10-CM | POA: Insufficient documentation

## 2013-09-06 DIAGNOSIS — F17200 Nicotine dependence, unspecified, uncomplicated: Secondary | ICD-10-CM | POA: Diagnosis present

## 2013-09-06 DIAGNOSIS — I25810 Atherosclerosis of coronary artery bypass graft(s) without angina pectoris: Secondary | ICD-10-CM | POA: Insufficient documentation

## 2013-09-06 DIAGNOSIS — E119 Type 2 diabetes mellitus without complications: Secondary | ICD-10-CM | POA: Diagnosis present

## 2013-09-06 DIAGNOSIS — Z9861 Coronary angioplasty status: Secondary | ICD-10-CM | POA: Insufficient documentation

## 2013-09-06 DIAGNOSIS — I428 Other cardiomyopathies: Secondary | ICD-10-CM | POA: Insufficient documentation

## 2013-09-06 DIAGNOSIS — I509 Heart failure, unspecified: Secondary | ICD-10-CM | POA: Insufficient documentation

## 2013-09-06 DIAGNOSIS — I4891 Unspecified atrial fibrillation: Secondary | ICD-10-CM | POA: Diagnosis present

## 2013-09-06 HISTORY — DX: Chronic systolic (congestive) heart failure: I50.22

## 2013-09-06 HISTORY — DX: Type 2 diabetes mellitus without complications: E11.9

## 2013-09-06 HISTORY — DX: Unspecified atrial fibrillation: I48.91

## 2013-09-06 HISTORY — DX: Atherosclerotic heart disease of native coronary artery without angina pectoris: I25.10

## 2013-09-06 LAB — CBC WITH DIFFERENTIAL/PLATELET
Eosinophils Relative: 1 % (ref 0–5)
HCT: 45 % (ref 39.0–52.0)
Lymphocytes Relative: 13 % (ref 12–46)
Lymphs Abs: 1.5 10*3/uL (ref 0.7–4.0)
MCV: 90.4 fL (ref 78.0–100.0)
Monocytes Absolute: 1.1 10*3/uL — ABNORMAL HIGH (ref 0.1–1.0)
RBC: 4.98 MIL/uL (ref 4.22–5.81)
WBC: 11.9 10*3/uL — ABNORMAL HIGH (ref 4.0–10.5)

## 2013-09-06 LAB — HEMOGLOBIN A1C: Hgb A1c MFr Bld: 9.8 % — ABNORMAL HIGH (ref <5.7)

## 2013-09-06 LAB — GLUCOSE, CAPILLARY: Glucose-Capillary: 295 mg/dL — ABNORMAL HIGH (ref 70–99)

## 2013-09-06 LAB — BASIC METABOLIC PANEL
BUN: 13 mg/dL (ref 6–23)
CO2: 26 mEq/L (ref 19–32)
Calcium: 9.5 mg/dL (ref 8.4–10.5)
Creatinine, Ser: 0.84 mg/dL (ref 0.50–1.35)
Glucose, Bld: 334 mg/dL — ABNORMAL HIGH (ref 70–99)

## 2013-09-06 MED ORDER — ATORVASTATIN CALCIUM 10 MG PO TABS
10.0000 mg | ORAL_TABLET | Freq: Every day | ORAL | Status: DC
  Administered 2013-09-06 – 2013-09-08 (×3): 10 mg via ORAL
  Filled 2013-09-06 (×3): qty 1

## 2013-09-06 MED ORDER — SODIUM CHLORIDE 0.9 % IV SOLN: 250.0000 mL | INTRAVENOUS | Status: DC | PRN

## 2013-09-06 MED ORDER — FUROSEMIDE 10 MG/ML IJ SOLN
40.0000 mg | Freq: Once | INTRAMUSCULAR | Status: AC
  Administered 2013-09-06: 40 mg via INTRAVENOUS
  Filled 2013-09-06: qty 4

## 2013-09-06 MED ORDER — MAGNESIUM HYDROXIDE 400 MG/5ML PO SUSP: 30.0000 mL | Freq: Every day | ORAL | Status: DC | PRN

## 2013-09-06 MED ORDER — NITROGLYCERIN 0.4 MG SL SUBL: 0.4000 mg | SUBLINGUAL_TABLET | SUBLINGUAL | Status: DC | PRN

## 2013-09-06 MED ORDER — ASPIRIN 81 MG PO CHEW
324.0000 mg | CHEWABLE_TABLET | Freq: Once | ORAL | Status: AC
  Administered 2013-09-06: 324 mg via ORAL
  Filled 2013-09-06: qty 4

## 2013-09-06 MED ORDER — SODIUM CHLORIDE 0.9 % IJ SOLN
3.0000 mL | Freq: Two times a day (BID) | INTRAMUSCULAR | Status: DC
  Administered 2013-09-06 – 2013-09-08 (×3): 3 mL via INTRAVENOUS

## 2013-09-06 MED ORDER — ACETAMINOPHEN 325 MG PO TABS: 650.0000 mg | ORAL_TABLET | Freq: Four times a day (QID) | ORAL | Status: DC | PRN

## 2013-09-06 MED ORDER — SODIUM CHLORIDE 0.9 % IJ SOLN: 3.0000 mL | INTRAMUSCULAR | Status: DC | PRN

## 2013-09-06 MED ORDER — DOCUSATE SODIUM 100 MG PO CAPS
100.0000 mg | ORAL_CAPSULE | Freq: Two times a day (BID) | ORAL | Status: DC
  Administered 2013-09-06 – 2013-09-07 (×4): 100 mg via ORAL
  Filled 2013-09-06 (×5): qty 1

## 2013-09-06 MED ORDER — HYDROCODONE-ACETAMINOPHEN 5-325 MG PO TABS: 1.0000 | ORAL_TABLET | Freq: Four times a day (QID) | ORAL | Status: DC | PRN

## 2013-09-06 MED ORDER — NITROGLYCERIN 0.4 MG SL SUBL
0.4000 mg | SUBLINGUAL_TABLET | SUBLINGUAL | Status: AC | PRN
  Administered 2013-09-06 (×3): 0.4 mg via SUBLINGUAL

## 2013-09-06 MED ORDER — LISINOPRIL 10 MG PO TABS
20.0000 mg | ORAL_TABLET | Freq: Every day | ORAL | Status: DC
  Administered 2013-09-06 – 2013-09-08 (×2): 20 mg via ORAL
  Filled 2013-09-06 (×2): qty 2

## 2013-09-06 MED ORDER — CARVEDILOL 12.5 MG PO TABS
25.0000 mg | ORAL_TABLET | Freq: Two times a day (BID) | ORAL | Status: DC
  Administered 2013-09-06 – 2013-09-08 (×5): 25 mg via ORAL
  Filled 2013-09-06 (×5): qty 2

## 2013-09-06 MED ORDER — ONDANSETRON HCL 4 MG PO TABS: 4.0000 mg | ORAL_TABLET | Freq: Four times a day (QID) | ORAL | Status: DC | PRN

## 2013-09-06 MED ORDER — POTASSIUM CHLORIDE CRYS ER 20 MEQ PO TBCR
40.0000 meq | EXTENDED_RELEASE_TABLET | Freq: Once | ORAL | Status: AC
  Administered 2013-09-06: 40 meq via ORAL
  Filled 2013-09-06: qty 2

## 2013-09-06 MED ORDER — MORPHINE SULFATE 2 MG/ML IJ SOLN: 1.0000 mg | INTRAMUSCULAR | Status: DC | PRN

## 2013-09-06 MED ORDER — ISOSORBIDE MONONITRATE ER 30 MG PO TB24
30.0000 mg | ORAL_TABLET | Freq: Every day | ORAL | Status: DC
  Administered 2013-09-06 – 2013-09-08 (×3): 30 mg via ORAL
  Filled 2013-09-06 (×4): qty 1

## 2013-09-06 MED ORDER — INSULIN ASPART 100 UNIT/ML ~~LOC~~ SOLN
0.0000 [IU] | Freq: Three times a day (TID) | SUBCUTANEOUS | Status: DC
  Administered 2013-09-06: 15 [IU] via SUBCUTANEOUS
  Administered 2013-09-06: 8 [IU] via SUBCUTANEOUS
  Administered 2013-09-07: 11 [IU] via SUBCUTANEOUS
  Administered 2013-09-07: 8 [IU] via SUBCUTANEOUS
  Administered 2013-09-07: 11 [IU] via SUBCUTANEOUS

## 2013-09-06 MED ORDER — MAGNESIUM CITRATE PO SOLN: 1.0000 | Freq: Once | ORAL | Status: AC | PRN

## 2013-09-06 MED ORDER — ASPIRIN EC 81 MG PO TBEC: 81.0000 mg | DELAYED_RELEASE_TABLET | Freq: Every day | ORAL | Status: DC

## 2013-09-06 MED ORDER — FUROSEMIDE 10 MG/ML IJ SOLN
40.0000 mg | Freq: Two times a day (BID) | INTRAMUSCULAR | Status: DC
  Administered 2013-09-06: 40 mg via INTRAVENOUS
  Filled 2013-09-06 (×2): qty 4

## 2013-09-06 MED ORDER — ALUM & MAG HYDROXIDE-SIMETH 200-200-20 MG/5ML PO SUSP
30.0000 mL | Freq: Four times a day (QID) | ORAL | Status: DC | PRN
  Administered 2013-09-08: 30 mL via ORAL
  Filled 2013-09-06: qty 30

## 2013-09-06 MED ORDER — BISACODYL 10 MG RE SUPP: 10.0000 mg | Freq: Every day | RECTAL | Status: DC | PRN

## 2013-09-06 MED ORDER — DIGOXIN 125 MCG PO TABS
125.0000 ug | ORAL_TABLET | Freq: Every day | ORAL | Status: DC
  Administered 2013-09-06 – 2013-09-08 (×3): 125 ug via ORAL
  Filled 2013-09-06 (×3): qty 1

## 2013-09-06 MED ORDER — ONDANSETRON HCL 4 MG/2ML IJ SOLN: 4.0000 mg | Freq: Four times a day (QID) | INTRAMUSCULAR | Status: DC | PRN

## 2013-09-06 MED ORDER — ENOXAPARIN SODIUM 40 MG/0.4ML ~~LOC~~ SOLN: 40.0000 mg | SUBCUTANEOUS | Status: DC

## 2013-09-06 MED ORDER — ACETAMINOPHEN 650 MG RE SUPP: 650.0000 mg | Freq: Four times a day (QID) | RECTAL | Status: DC | PRN

## 2013-09-06 MED ORDER — ASPIRIN EC 325 MG PO TBEC
325.0000 mg | DELAYED_RELEASE_TABLET | Freq: Every day | ORAL | Status: DC
  Administered 2013-09-06 – 2013-09-08 (×3): 325 mg via ORAL
  Filled 2013-09-06 (×3): qty 1

## 2013-09-06 MED ORDER — DABIGATRAN ETEXILATE MESYLATE 150 MG PO CAPS
150.0000 mg | ORAL_CAPSULE | Freq: Two times a day (BID) | ORAL | Status: DC
  Administered 2013-09-06 – 2013-09-08 (×5): 150 mg via ORAL
  Filled 2013-09-06 (×10): qty 1

## 2013-09-06 MED ORDER — MORPHINE SULFATE 4 MG/ML IJ SOLN: 4.0000 mg | INTRAMUSCULAR | Status: DC | PRN

## 2013-09-06 NOTE — H&P (Signed)
[facility] History and Physical  [name] XBJ:[id number] DOB: [birth year] DOA: 09/06/2013  Referring physician:  PCP: Alice Reichert, MD  Specialists:   Chief Complaint: Chest pain  HPI: [name] is a 67 y.o. male with a past medical history that includes ASCVD, A. fib, diabetes, CHF, presents to the emergency room with chief complaint of chest pain. Information is obtained from the patient. He indicates that he has been in his usual state of health until last night while he was sleeping he was awakened from his sleep with chest pain. Pain was located in anterior center of his chest. It radiated to his left arm. He describes the pain as a squeezing sensation. He states the pain was constant and nothing made it better or worse. He indicated he got up to the bathroom a couple of times during this episode and the pain did not worsen nor did it get any better. He rates the pain an 8/10. He denies any nausea vomiting diaphoresis palpitations worsening shortness of breath. He denies lower extremity edema or orthopnea. Denies headache visual disturbances numbness or tingling of his extremities syncope or near-syncope. He indicated that he was able to drift off to sleep periodically during the night but when he awakened this morning he relies for pain was not going to resolve itself so he decided to come to the hospital. Emergency room he was given aspirin and nitroglycerin x3 and the pain subsided. Initial evaluation in the emergency room yields lab work significant for sodium of 132, white count 11.9, proBNP 4612, dig level less than 0.3 and troponin 1 negative otherwise unremarkable. Chest x-ray yields slight increase in interstitial edema felt to be consistent with a degree of congestive heart failure. No airspace consolidation. EKG with A. fib and nonspecific T-wave abnormality that is different from EKG taken in July of 2013. [facility] are asked to admit.   Review of Systems:  10 point review of systems complete and all systems are negative except as indicated in the history of present illness.  Past Medical History  Diagnosis Date  . ASCVD (arteriosclerotic cardiovascular disease)      Ant. MI in 4/95; PTCA of 90% prox & distal LAD unsuccessful-->  Urgent CABG-4/95 TO Cx; 50% RCA; ant. HK; EF of 50-55%; 08/2008: 3-V disease plus a  95% LIMA stenosis, treated with DES; occlusion of SVG to CX; RCA SVG stenosis--> PCI with DES  2010: in-stent restenosis in the LIMA-->cutting balloon; EF of 35-40%;  07/2010: TO of SVG to     RCA-medical therapy advised; EF of 30-35% by echo in 12/2010  . Paroxysmal a-fib 1995    1995, 2009; bradycardia with beta blocker therapy  . Tobacco abuse      40-50 pack years; currently one pack per day  . Peripheral vascular disease 1995    abdominal aortic obstruction-1995-->vascular surgery  . Hyperlipidemia   . DM (diabetes mellitus)   . DDD (degenerative disc disease)     discectomy and fusion at L4 and L5  . Alcohol use 1999    Excessive alcohol use- quit in 1999  . CHF (congestive heart failure)    Past Surgical History  Procedure Laterality Date  . Cholecystectomy  1995  . Lumbar fusion      +Discectomy;x2; L4 and L5  . Aorto-femoral bypass graft  1995   Social History:  reports that he has been smoking.  He has never used smokeless tobacco. He reports that he does not drink alcohol  or use illicit drugs.  Patient is married and lives with his wife. He is a retired Banker. He is independent with ADLs.  No Known Allergies  No family history on file. in the medical history positive for hypertension, diabetes, CAD  Prior to Admission medications   Medication Sig Start Date End Date Taking? Authorizing Provider  aspirin EC 81 MG tablet Take 81 mg by mouth daily.   Yes Historical Provider, MD  carvedilol (COREG) 25 MG tablet TAKE ONE TABLET TWICE DAILY 06/18/13  Yes Kathlen Brunswick, MD  CRESTOR 40 MG tablet TAKE ONE (1)  TABLET BY MOUTH EVERY DAY 05/20/13  Yes Kathlen Brunswick, MD  digoxin (LANOXIN) 0.125 MG tablet Take 125 mcg by mouth daily.     Yes Historical Provider, MD  furosemide (LASIX) 40 MG tablet Take 40 mg by mouth daily.   Yes Historical Provider, MD  glimepiride (AMARYL) 2 MG tablet Take 1 tablet (2 mg total) by mouth daily before breakfast. 12/22/11  Yes Kathlen Brunswick, MD  hydrocodone-acetaminophen (LORCET-HD) 5-500 MG per capsule Take 1 capsule by mouth every 6 (six) hours as needed for pain.    Yes Historical Provider, MD  lisinopril (PRINIVIL,ZESTRIL) 20 MG tablet TAKE ONE (1) TABLET EACH DAY 11/26/12  Yes Kathlen Brunswick, MD  metFORMIN (GLUCOPHAGE) 1000 MG tablet TAKE ONE TABLET TWICE DAILY 06/21/11  Yes Kathlen Brunswick, MD  nitroGLYCERIN (NITROSTAT) 0.4 MG SL tablet Place 1 tablet (0.4 mg total) under the tongue every 5 (five) minutes as needed. 05/24/12  Yes Kathlen Brunswick, MD  potassium chloride (K-DUR) 10 MEQ tablet TAKE ONE (1) TABLET BY MOUTH EVERY DAY 01/24/13  Yes Kathlen Brunswick, MD  PRADAXA 150 MG CAPS TAKE ONE CAPSULE BY MOUTH TWICE A DAY 10/29/12  Yes Kathlen Brunswick, MD  spironolactone (ALDACTONE) 25 MG tablet Take 25 mg by mouth daily.     Yes Historical Provider, MD   Physical Exam: Filed Vitals:   09/06/13 1000  BP:   Pulse:   Temp: 97.6 F (36.4 C)  Resp:      General:  Well-nourished no acute distress  Eyes: PE RRL, EOMI, no scleral icterus  ENT: Ears clear nose without drainage oropharynx without erythema or exudate. Mucous membranes of his mouth are moist and pink. Very poor dentition  Neck: Supple full range of motion no lymphadenopathy  Cardiovascular: Irregularly irregular, no murmur no gallop no rub, no lower extremity edema,  Respiratory: Increased work of breathing with conversation. Fine crackles in bilateral bases particularly on the left. No wheeze no rhonchi no use of accessory muscles  Abdomen: Soft positive bowel sounds nontender to  palpation no mass organomegaly noted  Skin: Warm and dry no rash no lesions  Musculoskeletal:  No clubbing no cyanosis moves all extremities  Psychiatric: Calm core  Neurologic: Cranial nerves II through XII grossly intact. Speech clear facial symmetry  Labs on Admission:  Basic Metabolic Panel:  Recent Labs Lab 09/06/13 0807  NA 132*  K 4.6  CL 97  CO2 26  GLUCOSE 334*  BUN 13  CREATININE 0.84  CALCIUM 9.5   Liver Function Tests: No results found for this basename: AST, ALT, ALKPHOS, BILITOT, PROT, ALBUMIN,  in the last 168 hours No results found for this basename: LIPASE, AMYLASE,  in the last 168 hours No results found for this basename: AMMONIA,  in the last 168 hours CBC:  Recent Labs Lab 09/06/13 0807  WBC 11.9*  NEUTROABS 9.1*  HGB 15.5  HCT 45.0  MCV 90.4  PLT 191   Cardiac Enzymes:  Recent Labs Lab 09/06/13 0807  TROPONINI <0.30    BNP (last 3 results)  Recent Labs  09/06/13 0807  PROBNP 4612.0*   CBG: No results found for this basename: GLUCAP,  in the last 168 hours  Radiological Exams on Admission: Dg Chest Portable 1 View  09/06/2013   CLINICAL DATA:  Chest pain; congestive heart failure  EXAM: PORTABLE CHEST - 1 VIEW  COMPARISON:  July 11, 2012  FINDINGS: There is slightly increased interstitial edema. There is no airspace consolidation. Heart is enlarged with pulmonary venous hypertension. No adenopathy. Patient is status post coronary artery bypass grafting.  IMPRESSION: Slight increase in interstitial edema. There is felt to be a degree of congestive heart failure present. No airspace consolidation.   Electronically Signed   By: Bretta Bang   On: 09/06/2013 08:14    EKG: Independently reviewed yields A. fib with T-wave abnormality not present and EKG done in April of 2013 to  Assessment/Plan Principal Problem:   Chest pain: Atypical. Etiology uncertain. Will admit to telemetry for rule out. Will cycle his cardiac enzymes,  repeat his EKG in the a.m. At the time of my exam patient denied any chest pain. Will provide oxygen supplementation as needed as well as nitroglycerin morphine and Zofran as indicated. Active Problems:  Acute on chronic systolic heart failure: I be related to #1. Patient indicates may need a cardiology to recent change in his medication and that he is compliant with his meds. As far as his diet goes he reports "I eat pretty much whatever I want". Hold his home Lasix and provide IV Lasix 40 mg twice a day. He is also on spironolactone but I will hold for now. Will monitor his intake and output closely. Will obtain daily weights. Last echo in the chart dated 2012 with a 30% EF. Will repeat echo. Continue his home beta blocker and dig.  Atrial fibrillation: Currently rate controlled. Patient's home medications include Coreg, Lanoxin, Pradaxa   Arteriosclerotic cardiovascular disease (ASCVD): Chart review indicates patient seen by cardiology in April of this year. At that time Dr. Dietrich Pates opined that he represented borderline criteria for AICD implantation. The on results of 2-D echo May need cardiology consult to pursue    DIABETES MELLITUS, TYPE II: Patient on oral agents only. He does not follow her car modified diet. We will check his hemoglobin A1c. Will hold his oral agents for now and use sliding scale insulin for optimal glycemic control.    Hyperlipidemia: Will check a lipid panel and continue his home meds.    TOBACCO ABUSE: Counseled regarding cessation.     Code Status: full Family Communication: none available Disposition Plan: home hopefully tomorrow  Time spent: 65 minutes  Gwenyth Bender [facility] Pager [phone]  If 7PM-7AM, please contact night-coverage [email or web address] Password [facility] 09/06/2013, 10:49 AM

## 2013-09-06 NOTE — Progress Notes (Signed)
Report called to Leanor Kail, RN and pt transferred to room 328 along with all personal belongings via wheelchair. Family was notified.

## 2013-09-06 NOTE — ED Provider Notes (Signed)
CSN: [id number]     Arrival date & time 09/06/13  0735 History   First MD Initiated Contact with Patient 09/06/13 [phone]     Chief Complaint  Patient presents with  . Chest Pain    HPI Pt was seen at 0755.  Per pt, c/o gradual onset and persistence of constant mid-sternal chest "pain" that began overnight last night. Describes the pain as "pressure," and "like when I had a heart attack." States the pain radiates into his left arm. Has been assoc with SOB, esp on exertion. States he took his own SL ntg x1 with partial relief of his symptoms. Denies palpitations, no cough, no abd pain, no back pain, no N/V/D, no fevers.   Cards: [place] Past Medical History  Diagnosis Date  . ASCVD (arteriosclerotic cardiovascular disease)      Ant. MI in 4/95; PTCA of 90% prox & distal LAD unsuccessful-->  Urgent CABG-4/95 TO Cx; 50% RCA; ant. HK; EF of 50-55%; 08/2008: 3-V disease plus a  95% LIMA stenosis, treated with DES; occlusion of SVG to CX; RCA SVG stenosis--> PCI with DES  2010: in-stent restenosis in the LIMA-->cutting balloon; EF of 35-40%;  07/2010: TO of SVG to     RCA-medical therapy advised; EF of 30-35% by echo in 12/2010  . Paroxysmal a-fib 1995    1995, 2009; bradycardia with beta blocker therapy  . Tobacco abuse      40-50 pack years; currently one pack per day  . Peripheral vascular disease 1995    abdominal aortic obstruction-1995-->vascular surgery  . Hyperlipidemia   . DM (diabetes mellitus)   . DDD (degenerative disc disease)     discectomy and fusion at L4 and L5  . Alcohol use 1999    Excessive alcohol use- quit in 1999  . CHF (congestive heart failure)    Past Surgical History  Procedure Laterality Date  . Cholecystectomy  1995  . Lumbar fusion      +Discectomy;x2; L4 and L5  . Aorto-femoral bypass graft  1995   No family history on file. History  Substance Use Topics  . Smoking status: Current Every Day Smoker -- 0.50 packs/day  . Smokeless tobacco: Never Used  .  Alcohol Use: No     Comment: former    Review of Systems ROS: Statement: All systems negative except as marked or noted in the HPI; Constitutional: Negative for fever and chills. ; ; Eyes: Negative for eye pain, redness and discharge. ; ; ENMT: Negative for ear pain, hoarseness, nasal congestion, sinus pressure and sore throat. ; ; Cardiovascular: +CP, SOB. Negative for palpitations, diaphoresis, and peripheral edema. ; ; Respiratory: Negative for cough, wheezing and stridor. ; ; Gastrointestinal: Negative for nausea, vomiting, diarrhea, abdominal pain, blood in stool, hematemesis, jaundice and rectal bleeding. . ; ; Genitourinary: Negative for dysuria, flank pain and hematuria. ; ; Musculoskeletal: Negative for back pain and neck pain. Negative for swelling and trauma.; ; Skin: Negative for pruritus, rash, abrasions, blisters, bruising and skin lesion.; ; Neuro: Negative for headache, lightheadedness and neck stiffness. Negative for weakness, altered level of consciousness , altered mental status, extremity weakness, paresthesias, involuntary movement, seizure and syncope.       Allergies  Review of patient's allergies indicates no known allergies.  Home Medications   Current Outpatient Rx  Name  Route  Sig  Dispense  Refill  . EXPIRED: aspirin 81 MG tablet   Oral   Take 1 tablet (81 mg total) by mouth daily.  30 tablet   3   . carvedilol (COREG) 25 MG tablet      TAKE ONE TABLET TWICE DAILY   60 tablet   3   . CRESTOR 40 MG tablet      TAKE ONE (1) TABLET BY MOUTH EVERY DAY   30 tablet   5     PATIENT IS GOING OUT OF TOWN   . digoxin (LANOXIN) 0.125 MG tablet   Oral   Take 125 mcg by mouth daily.           Marland Kitchen EXPIRED: furosemide (LASIX) 40 MG tablet   Oral   Take 1 tablet (40 mg total) by mouth daily.   90 tablet   3   . glimepiride (AMARYL) 2 MG tablet   Oral   Take 1 tablet (2 mg total) by mouth daily before breakfast.   90 tablet   3   .  hydrocodone-acetaminophen (LORCET-HD) 5-500 MG per capsule   Oral   Take 1 capsule by mouth every 6 (six) hours as needed.           Marland Kitchen lisinopril (PRINIVIL,ZESTRIL) 20 MG tablet      TAKE ONE (1) TABLET EACH DAY   30 tablet   0     Patient needs to schedule an office visit for futu ...   . lisinopril (PRINIVIL,ZESTRIL) 20 MG tablet      TAKE ONE (1) TABLET EACH DAY   30 tablet   3   . metFORMIN (GLUCOPHAGE) 1000 MG tablet      TAKE ONE TABLET TWICE DAILY   60 tablet   6   . nitroGLYCERIN (NITROSTAT) 0.4 MG SL tablet   Sublingual   Place 1 tablet (0.4 mg total) under the tongue every 5 (five) minutes as needed.   25 tablet   6   . potassium chloride (K-DUR) 10 MEQ tablet      TAKE ONE (1) TABLET BY MOUTH EVERY DAY   30 tablet   6   . PRADAXA 150 MG CAPS      TAKE ONE CAPSULE BY MOUTH TWICE A DAY   60 capsule   3   . spironolactone (ALDACTONE) 25 MG tablet   Oral   Take 25 mg by mouth daily.            BP 148/83  Pulse 99  Temp(Src) 98.1 F (36.7 C) (Oral)  Resp 24  Ht 5\' 9"  (1.753 m)  Wt 180 lb (81.647 kg)  BMI 26.57 kg/m2  SpO2 92% Physical Exam 0800: Physical examination:  Nursing notes reviewed; Vital signs and O2 SAT reviewed;  Constitutional: Well developed, Well nourished, Well hydrated, In no acute distress; Head:  Normocephalic, atraumatic; Eyes: EOMI, PERRL, No scleral icterus; ENMT: Mouth and pharynx normal, Mucous membranes moist; Neck: Supple, Full range of motion, No lymphadenopathy; Cardiovascular: Irregular irregular rate and rhythm, No gallop; Respiratory: Breath sounds clear & equal bilaterally, No rales, rhonchi, wheezes.  Speaking full sentences with ease, Normal respiratory effort/excursion; Chest: Nontender, Movement normal; Abdomen: Soft, Nontender, Nondistended, Normal bowel sounds; Genitourinary: No CVA tenderness; Extremities: Pulses normal, No tenderness, No edema, No calf edema or asymmetry.; Neuro: AA&Ox3, Major CN grossly  intact.  Speech clear. No gross focal motor or sensory deficits in extremities.; Skin: Color normal, Warm, Dry.   ED Course  Procedures    MDM  MDM Reviewed: previous chart, nursing note and vitals Reviewed previous: labs and ECG Interpretation: labs, ECG and x-ray  Date: 09/06/2013  Rate: 97  Rhythm: atrial fibrillation  QRS Axis: normal  Intervals: normal  ST/T Wave abnormalities: normal  Conduction Disutrbances:none  Narrative Interpretation:   Old EKG Reviewed: unchanged; no significant changes from previous EKG dated 07/11/2012.  Results for orders placed during the hospital encounter of 09/06/13  CBC WITH DIFFERENTIAL      Result Value Range   WBC 11.9 (*) 4.0 - 10.5 K/uL   RBC 4.98  4.22 - 5.81 MIL/uL   Hemoglobin 15.5  13.0 - 17.0 g/dL   HCT 44.0  34.7 - 42.5 %   MCV 90.4  78.0 - 100.0 fL   MCH 31.1  26.0 - 34.0 pg   MCHC 34.4  30.0 - 36.0 g/dL   RDW 95.6  38.7 - 56.4 %   Platelets 191  150 - 400 K/uL   Neutrophils Relative % 77  43 - 77 %   Neutro Abs 9.1 (*) 1.7 - 7.7 K/uL   Lymphocytes Relative 13  12 - 46 %   Lymphs Abs 1.5  0.7 - 4.0 K/uL   Monocytes Relative 9  3 - 12 %   Monocytes Absolute 1.1 (*) 0.1 - 1.0 K/uL   Eosinophils Relative 1  0 - 5 %   Eosinophils Absolute 0.1  0.0 - 0.7 K/uL   Basophils Relative 1  0 - 1 %   Basophils Absolute 0.1  0.0 - 0.1 K/uL  BASIC METABOLIC PANEL      Result Value Range   Sodium 132 (*) 135 - 145 mEq/L   Potassium 4.6  3.5 - 5.1 mEq/L   Chloride 97  96 - 112 mEq/L   CO2 26  19 - 32 mEq/L   Glucose, Bld 334 (*) 70 - 99 mg/dL   BUN 13  6 - 23 mg/dL   Creatinine, Ser 3.32  0.50 - 1.35 mg/dL   Calcium 9.5  8.4 - 95.1 mg/dL   GFR calc non Af Amer 89 (*) >90 mL/min   GFR calc Af Amer >90  >90 mL/min  TROPONIN I      Result Value Range   Troponin I <0.30  <0.30 ng/mL  DIGOXIN LEVEL      Result Value Range   Digoxin Level <0.3 (*) 0.8 - 2.0 ng/mL  PRO B NATRIURETIC PEPTIDE      Result Value Range   Pro B  Natriuretic peptide (BNP) 4612.0 (*) 0 - 125 pg/mL   Dg Chest Portable 1 View 09/06/2013   CLINICAL DATA:  Chest pain; congestive heart failure  EXAM: PORTABLE CHEST - 1 VIEW  COMPARISON:  July 11, 2012  FINDINGS: There is slightly increased interstitial edema. There is no airspace consolidation. Heart is enlarged with pulmonary venous hypertension. No adenopathy. Patient is status post coronary artery bypass grafting.  IMPRESSION: Slight increase in interstitial edema. There is felt to be a degree of congestive heart failure present. No airspace consolidation.   Electronically Signed   By: Bretta Bang   On: 09/06/2013 08:14    Results for OLYVER, HAWES (MRN [id number]) as of 09/06/2013 09:13  Ref. Range 09/15/2010 06:25 02/12/2011 13:17 10/18/2011 09:40 07/11/2012 03:30 09/06/2013 08:07  Pro B Natriuretic peptide (BNP) Latest Range: 0-125 pg/mL 352.0 (H) 192.0 (H) 1549.0 (H) 2118.0 (H) 4612.0 (H)    0855:  Pt improved after ASA and SL ntg x3. BNP elevated from previous with CHF on CXR; IV lasix ordered. Significant hx ACS, will admit. Dx and testing d/w pt and  family.  Questions answered.  Verb understanding, agreeable to observation admit.  T/C to [facility] Dr. Kerry Hough, case discussed, including:  HPI, pertinent PM/SHx, VS/PE, dx testing, ED course and treatment:  Agreeable to admit, requests to write temporary orders, obtain tele bed to Dr. [name]' service.       Laray Anger, DO 09/07/13 2354

## 2013-09-06 NOTE — H&P (Signed)
Patient seen and examined.  Agree with note as above per  Toya Smothers.  Patient has known CAD and CHF with EF of 30% on echo from 2012.  He presents with chest pain and shortness of breath. Found to have evidence of acute on chronic systolic CHF on chest xray.  He will receive IV lasix and we will update echo.  Cardiology consultation for further evaluation of chest pain and need to continue digoxin for atrial fib.  (heart rate is controlled and dig level is subtherapeutic).  Patient will be admitted to the service of Dr. [name].  [facility] hospitalists will be available for any issues until 7AM on 9/17.  MEMON,JEHANZEB

## 2013-09-06 NOTE — Consult Note (Signed)
Primary cardiologist: Dr. [place] Bing Consulting cardiologist: Dr. Jonelle Sidle  Clinical Summary [name] is a 67 y.o.male admitted with recent onset chest pain symptoms. He awoke from sleep with chest pain last evening, some radiation to the left arm. Describes it in a way consistent with angina, took one nitroglycerin with perhaps mild improvement. He presented to the ER and was treated with additional therapies including nitroglycerin, now chest pain-free.  He denies any escalating pattern of angina, has not used nitroglycerin in quite some time. Otherwise NYHA class II dyspnea, no recent increasing edema, orthopnea. No increasing palpitations.  Last office visit with Dr. Dietrich Pates was in April. Prior assessment of LVEF approximately 30%, not recently assessed. No recent ischemic evaluation since catheterization in 2011.   No Known Allergies  Medications Scheduled Medications: . aspirin EC  325 mg Oral Daily  . atorvastatin  10 mg Oral q1800  . carvedilol  25 mg Oral BID WC  . dabigatran  150 mg Oral Q12H  . digoxin  125 mcg Oral Daily  . docusate sodium  100 mg Oral BID  . furosemide  40 mg Intravenous Q12H  . insulin aspart  0-15 Units Subcutaneous TID WC  . lisinopril  20 mg Oral Daily  . sodium chloride  3 mL Intravenous Q12H      PRN Medications:  sodium chloride, acetaminophen, acetaminophen, alum & mag hydroxide-simeth, bisacodyl, HYDROcodone-acetaminophen, magnesium citrate, magnesium hydroxide, morphine injection, nitroGLYCERIN, ondansetron (ZOFRAN) IV, ondansetron, sodium chloride   Past Medical History  Diagnosis Date  . Coronary atherosclerosis of native coronary artery     Ant. MI in 4/95; PTCA of 90% prox & distal LAD unsuccessful-->  Urgent CABG-4/95 TO Cx; 50% RCA; ant. HK; EF of 50-55%; 08/2008: 3-V disease plus a  95% LIMA stenosis, treated with DES; occlusion of SVG to CX; RCA SVG stenosis--> PCI with DES  2010: in-stent restenosis in the  LIMA-->cutting balloon; EF of 35-40%;  07/2010: TO of SVG to     RCA-medical therapy advised; EF of 30-35% by echo in 12/2010  . Paroxysmal a-fib 1995    1995, 2009; bradycardia with beta blocker therapy  . Tobacco abuse     40-50 pack years; currently one pack per day  . Peripheral vascular disease 1995    Abdominal aortic obstruction 1995-->vascular surgery  . Hyperlipidemia   . Type 2 diabetes mellitus   . DDD (degenerative disc disease)     Discectomy and fusion at L4 and L5  . Alcohol use 1999    Excessive alcohol use- quit in 1999  . Chronic systolic heart failure     LVEF 25-30% 2011    Past Surgical History  Procedure Laterality Date  . Cholecystectomy  1995  . Lumbar fusion      +Discectomy;x2; L4 and L5  . Aorto-femoral bypass graft  1995    Family History  Problem Relation Age of Onset  . CAD Father     Social History Mr. Mogel reports that he has been smoking Cigarettes.  He has been smoking about 0.50 packs per day. He has never used smokeless tobacco. Mr. Osmond reports that he does not drink alcohol.  Review of Systems  Physical Examination Blood pressure 129/60, pulse 88, temperature 97.6 F (36.4 C), temperature source Oral, resp. rate 19, height 5\' 9"  (1.753 m), weight 174 lb 9.7 oz (79.2 kg), SpO2 92.00%.  Intake/Output Summary (Last 24 hours) at 09/06/13 1430 Last data filed at 09/06/13 1300  Gross per 24  hour  Intake    720 ml  Output   1500 ml  Net   -780 ml   Patient appears comfortable at rest. HEENT: Conjunctiva and lids normal, oropharynx clear with poor dentition. Neck: Supple, no elevated JVP or carotid bruits, no thyromegaly. Lungs: Largely clear to auscultation, nonlabored breathing at rest. Cardiac: Regular rate and rhythm, no S3, soft systolic murmur, no pericardial rub. Abdomen: Soft, nontender, bowel sounds present, no guarding or rebound. Extremities: No pitting edema, distal pulses 2+. Skin: Warm and dry. Musculoskeletal: No  kyphosis. Neuropsychiatric: Alert and oriented x3, affect grossly appropriate.   Lab Results  Basic Metabolic Panel:  Recent Labs Lab 09/06/13 0807  NA 132*  K 4.6  CL 97  CO2 26  GLUCOSE 334*  BUN 13  CREATININE 0.84  CALCIUM 9.5    CBC:  Recent Labs Lab 09/06/13 0807  WBC 11.9*  NEUTROABS 9.1*  HGB 15.5  HCT 45.0  MCV 90.4  PLT 191    Cardiac Enzymes:  Recent Labs Lab 09/06/13 0807 09/06/13 1341  TROPONINI <0.30 <0.30    Pro-BNP: 4612  ECG Atrial fibrillation with old anterolateral infarct pattern, nonspecific ST-T changes.  Imaging PORTABLE CHEST - 1 VIEW  COMPARISON: July 11, 2012  FINDINGS: There is slightly increased interstitial edema. There is no airspace consolidation. Heart is enlarged with pulmonary venous hypertension. No adenopathy. Patient is status post coronary artery bypass grafting.  IMPRESSION: Slight increase in interstitial edema. There is felt to be a degree of congestive heart failure present. No airspace consolidation.   Impression  1. Chest pain syndrome, possibly angina based on description. ECG is nonspecific and initial cardiac markers are normal.  2. Multivessel CAD status post prior CABG and percutaneous interventions, detailed above. Last catheterization in 2011.  3. Ischemic myopathy, LVEF approximately 30%, two years ago. Perhaps mild interstitial edema by chest x-ray with increased proBNP. Doubt major decompensation in chronic systolic heart failure.  4. Permanent atrial fibrillation, managed with strategy of heart rate control and anticoagulation.  5. Ongoing tobacco abuse.   Recommendations  Reviewed medications and discussed with patient. Heart rate control regimen for atrial fibrillation is reasonable. No changes being made.  Will add Imdur 30 mg daily. Followup on echocardiogram for reassessment of LVEF. Followup trend in cardiac markers. Will determine if he needs any further ischemic testing as an  inpatient or outpatient depending on his clinical stability and enzymes.  Jonelle Sidle, M.D., F.A.C.C.

## 2013-09-06 NOTE — ED Notes (Signed)
Pt c/o pressure in chest and pain in left arm since some time early this am.  Reports felt like he had indigestion all night.  Denies n/v.  Reports SOB with exertion.

## 2013-09-07 DIAGNOSIS — I7090 Unspecified atherosclerosis: Secondary | ICD-10-CM

## 2013-09-07 DIAGNOSIS — I2510 Atherosclerotic heart disease of native coronary artery without angina pectoris: Secondary | ICD-10-CM

## 2013-09-07 DIAGNOSIS — I517 Cardiomegaly: Secondary | ICD-10-CM

## 2013-09-07 LAB — GLUCOSE, CAPILLARY: Glucose-Capillary: 269 mg/dL — ABNORMAL HIGH (ref 70–99)

## 2013-09-07 LAB — CBC
Hemoglobin: 16.3 g/dL (ref 13.0–17.0)
MCH: 30.8 pg (ref 26.0–34.0)
MCHC: 34.1 g/dL (ref 30.0–36.0)

## 2013-09-07 LAB — BASIC METABOLIC PANEL
BUN: 19 mg/dL (ref 6–23)
Calcium: 10.1 mg/dL (ref 8.4–10.5)
GFR calc non Af Amer: 73 mL/min — ABNORMAL LOW (ref >90)
Glucose, Bld: 269 mg/dL — ABNORMAL HIGH (ref 70–99)
Potassium: 4.4 mEq/L (ref 3.5–5.1)

## 2013-09-07 LAB — TROPONIN I: Troponin I: <0.3 ng/mL (ref <0.30)

## 2013-09-07 MED ORDER — METFORMIN HCL 500 MG PO TABS
1000.0000 mg | ORAL_TABLET | Freq: Two times a day (BID) | ORAL | Status: DC
  Administered 2013-09-08 (×2): 1000 mg via ORAL
  Filled 2013-09-07 (×2): qty 2

## 2013-09-07 MED ORDER — INSULIN ASPART 100 UNIT/ML ~~LOC~~ SOLN
20.0000 [IU] | Freq: Once | SUBCUTANEOUS | Status: AC
  Administered 2013-09-08: 20 [IU] via SUBCUTANEOUS

## 2013-09-07 MED ORDER — FUROSEMIDE 40 MG PO TABS
40.0000 mg | ORAL_TABLET | Freq: Every day | ORAL | Status: DC
  Administered 2013-09-07 – 2013-09-08 (×2): 40 mg via ORAL
  Filled 2013-09-07 (×2): qty 1

## 2013-09-07 MED ORDER — INSULIN GLARGINE 100 UNIT/ML ~~LOC~~ SOLN
12.0000 [IU] | Freq: Once | SUBCUTANEOUS | Status: AC
  Administered 2013-09-08: 12 [IU] via SUBCUTANEOUS
  Filled 2013-09-07: qty 0.12

## 2013-09-07 NOTE — Progress Notes (Signed)
[name], [name]               ACCOUNT NO.:  [id number]  MEDICAL RECORD NO.:  [id number]  LOCATION:  A328                          FACILITY:  [facility]  PHYSICIAN:  Iliya Spivack G. [name], MD   DATE OF BIRTH:  08-10-1946  DATE OF PROCEDURE: DATE OF DISCHARGE:                                PROGRESS NOTE   This patient is free of pain today.  He was admitted with chest pain, possibly angina.  He does have multivessel coronary artery disease with previous CABG, ischemic cardiomyopathy, atrial fib.  His cardiac markers and troponins have remained normal.  He has begun to diurese and feels better.  His atrial fibrillation has a controlled rate.  EXAMINATION:  GENERAL APPEARANCE:  The patient appears calm without symptoms. VITAL SIGNS:  Blood pressure 101/58, respirations 16, pulse 85, temp 98.2. HEENT:  Eyes, PERRLA.  TM negative.  Oropharynx benign. NECK:  Supple.  No JVD or thyroid abnormalities. HEART:  Irregular rhythm.  No cardiomegaly. LUNGS:  Occasional rhonchus heard over lung fields. ABDOMEN:  No palpable organs or masses. SKIN:  Warm and dry. EXTREMITIES:  Free of edema.  ASSESSMENT:  The patient was admitted with atypical chest pain.  He has acute on chronic systolic heart failure with cardiomyopathy, atrial fibrillation, diabetes mellitus type 2.  PLAN:  To continue use of p.r.n. nitroglycerin, oxygen supplement. Continue p.o. Lasix.  Continue Pradaxa, Coreg, Lanoxin.     Cale Bethard G. [name], MD     AGM/MEDQ  D:  09/07/2013  T:  09/07/2013  Job:  [id number]

## 2013-09-07 NOTE — Progress Notes (Signed)
UR chart review completed.  

## 2013-09-07 NOTE — Progress Notes (Signed)
Pt's BP has been low this morning.  RN called and notified Dr. [name] made Dr. [name] aware of what 0800 and 1000 medications were due.  Dr. [name] stated to administer the Coreg and Imdur and hold the Lisinopril.  Orders followed.

## 2013-09-07 NOTE — Progress Notes (Signed)
Primary cardiologist: Dr. [place] Bing (previous)  Subjective:   No chest discomfort or breathlessness today. Slept well.   Objective:   Temp:  [97.7 F (36.5 C)-98.3 F (36.8 C)] 97.7 F (36.5 C) (09/17 0600) Pulse Rate:  [85-90] 90 (09/17 0900) Resp:  [16-34] 18 (09/17 0600) BP: (96-146)/(53-104) 96/60 mmHg (09/17 0900) SpO2:  [91 %-97 %] 91 % (09/17 0900) Last BM Date: 09/05/13  Filed Weights   09/06/13 0746 09/06/13 1000  Weight: 180 lb (81.647 kg) 174 lb 9.7 oz (79.2 kg)    Intake/Output Summary (Last 24 hours) at 09/07/13 1020 Last data filed at 09/07/13 0910  Gross per 24 hour  Intake    960 ml  Output   1850 ml  Net   -890 ml    Telemetry: Atrial fibrillation as before.  Exam:  General: NAD. Appears comfortable.   Lungs: Course but clear.  Cardiac: Irregularly irregular.  Extremities: No pitting.   Lab Results:  Basic Metabolic Panel:  Recent Labs Lab 09/06/13 0807 09/07/13 0627  NA 132* 133*  K 4.6 4.4  CL 97 95*  CO2 26 28  GLUCOSE 334* 269*  BUN 13 19  CREATININE 0.84 1.04  CALCIUM 9.5 10.1    CBC:  Recent Labs Lab 09/06/13 0807 09/07/13 0627  WBC 11.9* 12.7*  HGB 15.5 16.3  HCT 45.0 47.8  MCV 90.4 90.4  PLT 191 251    Cardiac Enzymes:  Recent Labs Lab 09/06/13 1341 09/06/13 2002 09/07/13 0627  TROPONINI <0.30 <0.30 <0.30    ECG: Tracing 9/17 shows atrial fibrillation with evidence of old anterolateral infarct and nonspecific ST-T changes.   Medications:   Scheduled Medications: . aspirin EC  325 mg Oral Daily  . atorvastatin  10 mg Oral q1800  . carvedilol  25 mg Oral BID WC  . dabigatran  150 mg Oral Q12H  . digoxin  125 mcg Oral Daily  . docusate sodium  100 mg Oral BID  . furosemide  40 mg Intravenous Q12H  . insulin aspart  0-15 Units Subcutaneous TID WC  . isosorbide mononitrate  30 mg Oral Daily  . lisinopril  20 mg Oral Daily  . sodium chloride  3 mL Intravenous Q12H      PRN  Medications:  sodium chloride, acetaminophen, acetaminophen, alum & mag hydroxide-simeth, bisacodyl, HYDROcodone-acetaminophen, magnesium hydroxide, morphine injection, nitroGLYCERIN, ondansetron (ZOFRAN) IV, ondansetron, sodium chloride   Assessment:   1. Chest pain syndrome, possibly angina, resolved. ECG without acute ST segment changes and cardiac markers are normal. He feels much better this morning.  2. Multivessel CAD status post prior CABG and percutaneous interventions. Last catheterization in 2011.   3. Ischemic cardiomyopathy, LVEF approximately 30%, two years ago. Has diuresed well with IV Lasix, possible mild component of acute on chronic systolic heart failure. Followup echocardiogram pending  4. Permanent atrial fibrillation, managed with strategy of heart rate control and anticoagulation.   5. Ongoing tobacco abuse. Smoking cessation has been discussed.   Plan/Discussion:    Followup on echocardiogram for reassessment of LVEF. Change back to oral Lasix. Otherwise continue present regimen which now includes Imdur for antianginal effect. Anticipate that he should be able to go home later today. We will arrange an outpatient followup over the next few weeks with one of our new cardiology providers to take over for Dr. Dietrich Pates. Consideration can be given at that time as to whether any followup ischemic testing is warranted, versus continuing medical therapy and observation.   Remi Deter  Delman Kitten, M.D., F.A.C.C.

## 2013-09-07 NOTE — Progress Notes (Signed)
Inpatient Diabetes Program Recommendations  AACE/ADA: New Consensus Statement on Inpatient Glycemic Control (2013)  Target Ranges:  Prepandial:   less than 140 mg/dL      Peak postprandial:   less than 180 mg/dL (1-2 hours)      Critically ill patients:  140 - 180 mg/dL    Results for HESHAM, WOMAC (MRN [id number]) as of 09/07/2013 15:09  Ref. Range 09/06/2013 11:23 09/06/2013 16:34 09/06/2013 20:55 09/07/2013 07:48 09/07/2013 12:09  Glucose-Capillary Latest Range: 70-99 mg/dL 119 (H) 147 (H) 829 (H) 269 (H) 306 (H)    Inpatient Diabetes Program Recommendations Insulin - Basal: Please consider ordering low dose basal insulin; recommend starting with Lantus 10 units QHS. Correction (SSI): Please consider ordering Novolog bedtime correction scale.   Note: Patient has a history of diabetes and takes Amaryl 2 mg QAM and Metfornin 1000 mg BID as an outpatient for diabetes management.  Currently, patient is ordered to receive Novolog 0-15 units AC for inpatient glycemic control.  Initial lab glucose was 295 mg/dl on 5/62 @ 13:08 and fasting glucose this morning was 269 mg/dl.  Please consider ordering low dose basal insulin; recommend starting with Lantus 10 units QHS and ordering Novolog bedtime correction scale.  Will continue to follow.  Thanks, [name], RN, MSN, CCRN Diabetes Coordinator Inpatient Diabetes Program [phone] (Team Pager) [phone] ([facility] office) [phone] [facility] office)

## 2013-09-07 NOTE — Progress Notes (Signed)
*  PRELIMINARY RESULTS* Echocardiogram 2D Echocardiogram has been performed.  [name] 09/07/2013, 4:33 PM

## 2013-09-08 DIAGNOSIS — R079 Chest pain, unspecified: Secondary | ICD-10-CM

## 2013-09-08 LAB — BASIC METABOLIC PANEL
BUN: 27 mg/dL — ABNORMAL HIGH (ref 6–23)
CO2: 27 mEq/L (ref 19–32)
Chloride: 98 mEq/L (ref 96–112)
Creatinine, Ser: 1.06 mg/dL (ref 0.50–1.35)
GFR calc Af Amer: 83 mL/min — ABNORMAL LOW (ref >90)
Glucose, Bld: 187 mg/dL — ABNORMAL HIGH (ref 70–99)

## 2013-09-08 LAB — GLUCOSE, CAPILLARY
Glucose-Capillary: 220 mg/dL — ABNORMAL HIGH (ref 70–99)
Glucose-Capillary: 213 mg/dL — ABNORMAL HIGH (ref 70–99)

## 2013-09-08 MED ORDER — INSULIN ASPART 100 UNIT/ML ~~LOC~~ SOLN
0.0000 [IU] | Freq: Three times a day (TID) | SUBCUTANEOUS | Status: DC
  Administered 2013-09-08: 15 [IU] via SUBCUTANEOUS
  Administered 2013-09-08: 7 [IU] via SUBCUTANEOUS

## 2013-09-08 MED ORDER — INSULIN ASPART 100 UNIT/ML ~~LOC~~ SOLN: 0.0000 [IU] | Freq: Every day | SUBCUTANEOUS | Status: DC

## 2013-09-08 MED ORDER — INSULIN GLARGINE 100 UNIT/ML ~~LOC~~ SOLN
SUBCUTANEOUS | Status: AC
  Filled 2013-09-08: qty 10

## 2013-09-08 MED ORDER — ISOSORBIDE MONONITRATE ER 30 MG PO TB24: 30.0000 mg | ORAL_TABLET | Freq: Every day | ORAL | Status: DC

## 2013-09-08 NOTE — Progress Notes (Signed)
Inpatient Diabetes Program Recommendations  AACE/ADA: New Consensus Statement on Inpatient Glycemic Control (2013)  Target Ranges:  Prepandial:   less than 140 mg/dL      Peak postprandial:   less than 180 mg/dL (1-2 hours)      Critically ill patients:  140 - 180 mg/dL   Results for CIRE, CLUTE (MRN [id number]) as of 09/08/2013 11:16  Ref. Range 09/07/2013 07:48 09/07/2013 12:09 09/07/2013 16:43 09/07/2013 22:50 09/08/2013 03:49 09/08/2013 07:44  Glucose-Capillary Latest Range: 70-99 mg/dL 409 (H) 811 (H) 914 (H) 435 (H) 220 (H) 213 (H)   Inpatient Diabetes Program Recommendations Insulin - Basal: Please consider ordering Lantus 15 units QHS.  Note: Blood glucose on 9/17 ranged from 269-435 mg/dl and fasting blood glucose this morning was 213 mg/dl.  Noted patient received a one time dose of Lantus 12 units at 00:45 along with Novolog 20 units for correction of 435 mg/dl blood glucose.  Therefore, patient does not have a continuous order for basal insulin.  Please consider order Lantus 15 units QHS.  Will continue to follow.  Thanks, [name], RN, MSN, CCRN Diabetes Coordinator Inpatient Diabetes Program [phone] (Team Pager) [phone] ([facility] office) [phone] [facility] office)

## 2013-09-08 NOTE — Progress Notes (Signed)
09/08/13 1515 Patient concerned about possible discharge home today, stated "Dr. [name] said i would go home this afternoon". Spoke with Dr. [name] this afternoon, states planning to discharge home later today. Notified patient. Earnstine Regal, RN

## 2013-09-08 NOTE — Progress Notes (Signed)
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  Per hospital policy blood was drawn for the exposure panel. " 

## 2013-09-08 NOTE — Progress Notes (Signed)
   Please see full note from yesterday. Followup echocardiogram from yesterday afternoon shows relatively stable LVEF compared to 2011 study. We have already scheduled a followup visit in the [facility] office for cardiology evaluation. No further inpatient testing is anticipated at this time. We will sign off.  Jonelle Sidle, M.D., F.A.C.C.

## 2013-09-08 NOTE — Progress Notes (Signed)
09/08/13 1744 Reviewed discharge instructions with patient via teachback. Given copy of instructions, medication list, noted when medications due on med list. Notified prescription called in to [facility] per Dr. [name]. Discussed chest pain education sheet and when to call MD. Trenton Gammon understanding. IV site d/c'd, within normal limits. Telemetry d/c'd for discharge, notified CMT. No c/o chest pain or other discomfort at this time. Pt in stable condition awaiting ride home. Earnstine Regal, RN

## 2013-09-09 NOTE — Discharge Summary (Signed)
[name], [name]               ACCOUNT NO.:  [id number]  MEDICAL RECORD NO.:  [id number]  LOCATION:  A328                          FACILITY:  [facility]  PHYSICIAN:  Daryon Remmert G. [name], MD   DATE OF BIRTH:  30-Aug-1946  DATE OF ADMISSION:  09/06/2013 DATE OF DISCHARGE:  09/18/2014LH                              DISCHARGE SUMMARY   ADDENDUM  The patient will be sent home on the following medications: 1. Imdur 30 mg daily. 2. He is to resume Lanoxin 0.125 mg daily. 3. Lorcet HD 5/500 one every 6 hours as needed for pain. 4. Metformin 1000 mg b.i.d. 5. Spironolactone 25 mg daily. 6. Amaryl 2 mg daily. 7. Nitrostat 0.4 mg p.r.n. for chest pain. 8. Pradaxa 150 mg b.i.d. 9. Prinivil 20 mg daily. 10.Potassium chloride 10 mEq daily. 11.Crestor 40 mg daily. 12.Carvedilol in the form of Coreg 25 mg b.i.d. 13.Aspirin 81 mg daily. 14.Furosemide 40 mg daily.  The patient diuresed effectively while in the hospital and was asymptomatic for the latter part of his hospital stay.     Rashaun Curl G. [name], MD     AGM/MEDQ  D:  09/08/2013  T:  09/09/2013  Job:  [id number]

## 2013-09-09 NOTE — Discharge Summary (Signed)
NAMEDONAVAN, KERLIN               ACCOUNT NO.:  [id number]  MEDICAL RECORD NO.:  [id number]  LOCATION:  A328                          FACILITY:  [facility]  PHYSICIAN:  Nasra Counce G. [name], MD   DATE OF BIRTH:  1946-06-28  DATE OF ADMISSION:  09/06/2013 DATE OF DISCHARGE:  09/18/2014LH                              DISCHARGE SUMMARY   This 68 year old male was admitted on September 06, 2013 and discharged on September 08, 2013, two days hospitalization.  DIAGNOSES: 1. Atypical chest pain. 2. Arteriosclerotic cardiovascular disease. 3. Acute on chronic systolic congestive heart failure. 4. Atrial fibrillation with controlled rate. 5. Diabetes mellitus type 2 with hyperglycemia, hyperlipidemia,     hyponatremia.  CONDITION:  Stable and improved at the time of discharge.  HISTORY OF PRESENT ILLNESS:  This patient presented to the emergency room with a history of acute onset of chest pain, anterior side of his chest which radiated to his left arm.  He described this sensation as a squeezing type sensation, rated the pain at 8/10.  No nausea, vomiting, diaphoresis, etc.  He came to the emergency room where he was evaluated. Lab work showed evidence of hyponatremia with sodium 132, proBNP 4612. Chest x-ray showed slightly increased interstitial edema, consistent with early CHF.  Electrocardiogram with evidence of atrial fibrillation, nonspecific T-wave abnormality.  PHYSICAL EXAMINATION:  GENERAL:  On admission, alert male with blood pressure 96/60, respirations 19, pulse 66, temp 97.5. HEENT:  Eyes, PERRLA.  TMs negative. NECK:  Supple.  No JVD or thyroid abnormalities. HEART:  Irregular rhythm.  No cardiomegaly. LUNGS:  Occasional rhonchus over lower lung field. ABDOMEN:  No palpable organs or masses. SKIN:  Warm and dry. NEUROLOGIC:  Cranial nerves II-XII are intact.  No motor or sensory abnormalities.  LABORATORY DATA:  Admission CBC:  WBC 11,900 with hemoglobin 15.5, hematocrit  45.0.  Troponin less than 0.030.  ProBNP of 4612.  CBC:  WBC 11,900 with hemoglobin 15.5, hematocrit 45.0.  Subsequent troponins less than 0.30.  Glucose is ranged 269 through 306, once was 435.  IMAGING DATA:  X-rays:  Chest x-ray, slight increase in interstitial edema, evidence of history of congestive heart failure.  HOSPITAL COURSE:  The patient was placed on carbohydrate modified diet. He was continued on following medications; aspirin 325 mg daily, Lipitor 10 mg daily, Coreg 25 mg b.i.d., Pradaxa 150 mg every 12 hours, digoxin 125 mcg daily, Colace 100 mg b.i.d., furosemide started at 40 mg daily. He was given NovoLog insulin sliding scale, continued on Imdur 30 mg daily, Prinivil 20 mg daily.  Metformin was subsequently started at 1000 mg b.i.d.  He was kept on saline 0.9%, 3 mL IV q.12 h.  The patient was seen by Cardiology and evaluated.  He did not have any further chest pain.  His enzymes remained within normal range.  He did not have any overt symptoms of congestive heart failure and minimal dyspnea.  It was felt by Cardiology that he had chest pain syndrome, probably angina based on the description that he has multivessel coronary artery disease, CABG, and previous percutaneous interventions, ischemic cardiomyopathy with left ventricular ejection fraction of 30% __________.  Recommendation was add Imdur  30 mg daily following echocardiogram in order to assess the left ventricular ejection fraction __________ but no further aggressive treatment now because of stability and enzymes that were negative.  The patient did show evidence of elevated glucose and was given NovoLog insulin according to sliding scale and was __________ hospitalization placed back on metformin 1000 mg b.i.d.  His sugars did come down some with this.  It was felt that he was stable enough to be discharged after 2 days' hospitalization.  The patient was stable at the time of his discharge.  I was asked to  follow up with Cardiology and with primary care.     Zylen Wenig G. [name], MD     AGM/MEDQ  D:  09/08/2013  T:  09/09/2013  Job:  [id number]

## 2013-09-14 ENCOUNTER — Other Ambulatory Visit: Payer: Self-pay | Admitting: Cardiology

## 2013-09-17 ENCOUNTER — Observation Stay (HOSPITAL_COMMUNITY)
Admission: EM | Admit: 2013-09-17 | Discharge: 2013-09-20 | Disposition: A | Discharge status: Home / Self Care | Payer: Medicare Other | Attending: Family Medicine | Admitting: Family Medicine

## 2013-09-17 ENCOUNTER — Emergency Department (HOSPITAL_COMMUNITY): Payer: Medicare Other

## 2013-09-17 ENCOUNTER — Encounter (HOSPITAL_COMMUNITY): Payer: Self-pay | Admitting: Emergency Medicine

## 2013-09-17 DIAGNOSIS — I509 Heart failure, unspecified: Secondary | ICD-10-CM | POA: Insufficient documentation

## 2013-09-17 DIAGNOSIS — R079 Chest pain, unspecified: Secondary | ICD-10-CM

## 2013-09-17 DIAGNOSIS — I2589 Other forms of chronic ischemic heart disease: Secondary | ICD-10-CM | POA: Insufficient documentation

## 2013-09-17 DIAGNOSIS — R072 Precordial pain: Principal | ICD-10-CM | POA: Insufficient documentation

## 2013-09-17 DIAGNOSIS — I2510 Atherosclerotic heart disease of native coronary artery without angina pectoris: Secondary | ICD-10-CM | POA: Insufficient documentation

## 2013-09-17 DIAGNOSIS — E119 Type 2 diabetes mellitus without complications: Secondary | ICD-10-CM | POA: Insufficient documentation

## 2013-09-17 DIAGNOSIS — I4891 Unspecified atrial fibrillation: Secondary | ICD-10-CM | POA: Insufficient documentation

## 2013-09-17 DIAGNOSIS — I5023 Acute on chronic systolic (congestive) heart failure: Secondary | ICD-10-CM | POA: Insufficient documentation

## 2013-09-17 LAB — CBC WITH DIFFERENTIAL/PLATELET
Basophils Absolute: 0.1 10*3/uL (ref 0.0–0.1)
Basophils Relative: 1 % (ref 0–1)
Eosinophils Absolute: 0.3 10*3/uL (ref 0.0–0.7)
Eosinophils Relative: 3 % (ref 0–5)
HCT: 44.9 % (ref 39.0–52.0)
Hemoglobin: 15.3 g/dL (ref 13.0–17.0)
MCH: 31.1 pg (ref 26.0–34.0)
MCHC: 34.1 g/dL (ref 30.0–36.0)
MCV: 91.3 fL (ref 78.0–100.0)
Monocytes Absolute: 0.8 10*3/uL (ref 0.1–1.0)
Monocytes Relative: 9 % (ref 3–12)
RDW: 12.4 % (ref 11.5–15.5)

## 2013-09-17 LAB — BASIC METABOLIC PANEL
BUN: 13 mg/dL (ref 6–23)
Calcium: 9.2 mg/dL (ref 8.4–10.5)
Chloride: 97 mEq/L (ref 96–112)
Creatinine, Ser: 0.93 mg/dL (ref 0.50–1.35)
GFR calc Af Amer: >90 mL/min (ref >90)
GFR calc non Af Amer: 86 mL/min — ABNORMAL LOW (ref >90)

## 2013-09-17 LAB — GLUCOSE, CAPILLARY
Glucose-Capillary: 299 mg/dL — ABNORMAL HIGH (ref 70–99)
Glucose-Capillary: 154 mg/dL — ABNORMAL HIGH (ref 70–99)

## 2013-09-17 LAB — DIGOXIN LEVEL: Digoxin Level: <0.3 ng/mL — ABNORMAL LOW (ref 0.8–2.0)

## 2013-09-17 LAB — POCT I-STAT TROPONIN I: Troponin i, poc: 0 ng/mL (ref 0.00–0.08)

## 2013-09-17 MED ORDER — DIGOXIN 125 MCG PO TABS
125.0000 ug | ORAL_TABLET | Freq: Every day | ORAL | Status: DC
  Administered 2013-09-17 – 2013-09-20 (×4): 125 ug via ORAL
  Filled 2013-09-17 (×4): qty 1

## 2013-09-17 MED ORDER — HYDROCODONE-ACETAMINOPHEN 5-325 MG PO TABS
1.0000 | ORAL_TABLET | ORAL | Status: DC | PRN
  Administered 2013-09-17 (×2): 1 via ORAL
  Filled 2013-09-17 (×2): qty 1

## 2013-09-17 MED ORDER — GLIMEPIRIDE 2 MG PO TABS
2.0000 mg | ORAL_TABLET | Freq: Every day | ORAL | Status: DC
  Administered 2013-09-17 – 2013-09-20 (×4): 2 mg via ORAL
  Filled 2013-09-17 (×4): qty 1

## 2013-09-17 MED ORDER — FUROSEMIDE 40 MG PO TABS
40.0000 mg | ORAL_TABLET | Freq: Every day | ORAL | Status: DC
  Administered 2013-09-17 – 2013-09-20 (×4): 40 mg via ORAL
  Filled 2013-09-17 (×4): qty 1

## 2013-09-17 MED ORDER — CARVEDILOL 12.5 MG PO TABS
25.0000 mg | ORAL_TABLET | Freq: Two times a day (BID) | ORAL | Status: DC
  Administered 2013-09-17 – 2013-09-20 (×6): 25 mg via ORAL
  Filled 2013-09-17 (×8): qty 2

## 2013-09-17 MED ORDER — POTASSIUM CHLORIDE CRYS ER 10 MEQ PO TBCR
10.0000 meq | EXTENDED_RELEASE_TABLET | Freq: Every day | ORAL | Status: DC
  Administered 2013-09-17 – 2013-09-20 (×4): 10 meq via ORAL
  Filled 2013-09-17 (×7): qty 1

## 2013-09-17 MED ORDER — NITROGLYCERIN 0.4 MG SL SUBL
0.4000 mg | SUBLINGUAL_TABLET | SUBLINGUAL | Status: DC | PRN
  Administered 2013-09-17: 0.4 mg via SUBLINGUAL
  Filled 2013-09-17: qty 25

## 2013-09-17 MED ORDER — ASPIRIN EC 81 MG PO TBEC
81.0000 mg | DELAYED_RELEASE_TABLET | Freq: Every day | ORAL | Status: DC
  Administered 2013-09-17 – 2013-09-20 (×4): 81 mg via ORAL
  Filled 2013-09-17 (×4): qty 1

## 2013-09-17 MED ORDER — ISOSORBIDE MONONITRATE ER 60 MG PO TB24
30.0000 mg | ORAL_TABLET | Freq: Every day | ORAL | Status: DC
  Administered 2013-09-17 – 2013-09-20 (×4): 30 mg via ORAL
  Filled 2013-09-17 (×4): qty 1

## 2013-09-17 MED ORDER — DABIGATRAN ETEXILATE MESYLATE 150 MG PO CAPS
150.0000 mg | ORAL_CAPSULE | Freq: Two times a day (BID) | ORAL | Status: DC
  Administered 2013-09-17 – 2013-09-20 (×7): 150 mg via ORAL
  Filled 2013-09-17 (×9): qty 1

## 2013-09-17 MED ORDER — METFORMIN HCL 500 MG PO TABS
1000.0000 mg | ORAL_TABLET | Freq: Two times a day (BID) | ORAL | Status: DC
  Administered 2013-09-17 – 2013-09-20 (×7): 1000 mg via ORAL
  Filled 2013-09-17 (×8): qty 2

## 2013-09-17 MED ORDER — CARVEDILOL 3.125 MG PO TABS
3.1250 mg | ORAL_TABLET | Freq: Two times a day (BID) | ORAL | Status: DC
  Administered 2013-09-17: 3.125 mg via ORAL
  Filled 2013-09-17: qty 1

## 2013-09-17 MED ORDER — ATORVASTATIN CALCIUM 20 MG PO TABS
20.0000 mg | ORAL_TABLET | Freq: Every day | ORAL | Status: DC
  Administered 2013-09-17 – 2013-09-19 (×3): 20 mg via ORAL
  Filled 2013-09-17 (×4): qty 1

## 2013-09-17 MED ORDER — LISINOPRIL 10 MG PO TABS
10.0000 mg | ORAL_TABLET | Freq: Every day | ORAL | Status: DC
  Administered 2013-09-18 – 2013-09-20 (×3): 10 mg via ORAL
  Filled 2013-09-17 (×4): qty 1

## 2013-09-17 MED ORDER — PANTOPRAZOLE SODIUM 40 MG PO TBEC
40.0000 mg | DELAYED_RELEASE_TABLET | Freq: Every day | ORAL | Status: DC
  Administered 2013-09-17 – 2013-09-20 (×4): 40 mg via ORAL
  Filled 2013-09-17 (×4): qty 1

## 2013-09-17 MED ORDER — SPIRONOLACTONE 25 MG PO TABS
25.0000 mg | ORAL_TABLET | Freq: Every day | ORAL | Status: DC
  Administered 2013-09-17 – 2013-09-20 (×4): 25 mg via ORAL
  Filled 2013-09-17 (×5): qty 1

## 2013-09-17 MED ORDER — NITROGLYCERIN 0.4 MG SL SUBL: 0.4000 mg | SUBLINGUAL_TABLET | SUBLINGUAL | Status: DC | PRN

## 2013-09-17 NOTE — Progress Notes (Signed)
Patient c/o chest pressure 3.5 to 4/10.  Patient does not have PRN pain medication.  Dr. [name] paged.

## 2013-09-17 NOTE — ED Notes (Signed)
Sleeping soundly, awakened for CXR.

## 2013-09-17 NOTE — ED Notes (Signed)
Patient complaining of chest pain x 1 hour. States it radiates down bilateral arms.

## 2013-09-17 NOTE — ED Provider Notes (Signed)
CSN: [id number]     Arrival date & time 09/17/13  0347 History   First MD Initiated Contact with Patient 09/17/13 [phone]     Chief Complaint  Patient presents with  . Chest Pain   (Consider location/radiation/quality/duration/timing/severity/associated sxs/prior Treatment) HPI 67 year old male with a history of atrial fibrillation and ischemic cardiomyopathy. He is here with chest pain that started about an hour prior to arrival. He describes it as a pressure. It was about a 6/10. He denies radiation to his arms stating that he has chronic neuropathy. He took some nitroglycerin with partial relief of the pain. He denies associated shortness of breath, diaphoresis or nausea. He had an echocardiogram performed the 17th of this month which showed about a 25% ejection fraction and wall motion abnormality consistent with ischemic cardiomyopathy.  Past Medical History  Diagnosis Date  . Coronary atherosclerosis of native coronary artery     Ant. MI in 4/95; PTCA of 90% prox & distal LAD unsuccessful-->  Urgent CABG-4/95 TO Cx; 50% RCA; ant. HK; EF of 50-55%; 08/2008: 3-V disease plus a  95% LIMA stenosis, treated with DES; occlusion of SVG to CX; RCA SVG stenosis--> PCI with DES  2010: in-stent restenosis in the LIMA-->cutting balloon; EF of 35-40%;  07/2010: TO of SVG to     RCA-medical therapy advised; EF of 30-35% by echo in 12/2010  . Atrial fibrillation 1995    1995, 2009; bradycardia with beta blocker therapy  . Tobacco abuse     40-50 pack years; currently one pack per day  . Peripheral vascular disease 1995    Abdominal aortic obstruction 1995-->vascular surgery  . Hyperlipidemia   . Type 2 diabetes mellitus   . DDD (degenerative disc disease)     Discectomy and fusion at L4 and L5  . Alcohol use 1999    Excessive alcohol use- quit in 1999  . Chronic systolic heart failure     LVEF 25-30% 2011   Past Surgical History  Procedure Laterality Date  . Cholecystectomy  1995  . Lumbar fusion       +Discectomy;x2; L4 and L5  . Aorto-femoral bypass graft  1995   Family History  Problem Relation Age of Onset  . CAD Father    History  Substance Use Topics  . Smoking status: Current Every Day Smoker -- 0.50 packs/day    Types: Cigarettes  . Smokeless tobacco: Never Used  . Alcohol Use: No     Comment: former    Review of Systems  All other systems reviewed and are negative.    Allergies  Review of patient's allergies indicates no known allergies.  Home Medications   Current Outpatient Rx  Name  Route  Sig  Dispense  Refill  . aspirin EC 81 MG tablet   Oral   Take 81 mg by mouth daily.         . carvedilol (COREG) 25 MG tablet      TAKE ONE TABLET TWICE DAILY   60 tablet   3   . CRESTOR 40 MG tablet      TAKE ONE (1) TABLET BY MOUTH EVERY DAY   30 tablet   5     PATIENT IS GOING OUT OF TOWN   . digoxin (LANOXIN) 0.125 MG tablet   Oral   Take 125 mcg by mouth daily.           . furosemide (LASIX) 40 MG tablet   Oral   Take 40 mg by mouth  daily.         . glimepiride (AMARYL) 2 MG tablet   Oral   Take 1 tablet (2 mg total) by mouth daily before breakfast.   90 tablet   3   . hydrocodone-acetaminophen (LORCET-HD) 5-500 MG per capsule   Oral   Take 1 capsule by mouth every 6 (six) hours as needed for pain.          . isosorbide mononitrate (IMDUR) 30 MG 24 hr tablet   Oral   Take 1 tablet (30 mg total) by mouth daily.   30 tablet   5   . lisinopril (PRINIVIL,ZESTRIL) 20 MG tablet      TAKE ONE (1) TABLET BY MOUTH EVERY DAY   30 tablet   6   . metFORMIN (GLUCOPHAGE) 1000 MG tablet      TAKE ONE TABLET TWICE DAILY   60 tablet   6   . nitroGLYCERIN (NITROSTAT) 0.4 MG SL tablet   Sublingual   Place 1 tablet (0.4 mg total) under the tongue every 5 (five) minutes as needed.   25 tablet   6   . potassium chloride (K-DUR) 10 MEQ tablet      TAKE ONE (1) TABLET BY MOUTH EVERY DAY   30 tablet   6   . PRADAXA 150 MG CAPS       TAKE ONE CAPSULE BY MOUTH TWICE A DAY   60 capsule   3   . spironolactone (ALDACTONE) 25 MG tablet   Oral   Take 25 mg by mouth daily.            BP 140/89  Pulse 73  Temp(Src) 97.9 F (36.6 C) (Oral)  Resp 20  Ht 5\' 9"  (1.753 m)  Wt 170 lb (77.111 kg)  BMI 25.09 kg/m2  SpO2 99%  Physical Exam General: Well-developed, well-nourished male in no acute distress; appearance consistent with age of record HENT: normocephalic; atraumatic Eyes: pupils equal, round and reactive to light; extraocular muscles intact Neck: supple Heart: Irregular rhythm; distant sound Lungs: clear to auscultation bilaterally Abdomen: soft; nondistended; nontender; bowel sounds present Extremities: No deformity; full range of motion; dorsalis pedis and posterior tibial pulses +1 Neurologic: Awake, alert and oriented; motor function intact in all extremities and symmetric; no facial droop Skin: Warm and dry Psychiatric: Normal mood and affect    ED Course  Procedures (including critical care time)    MDM   Nursing notes and vitals signs, including pulse oximetry, reviewed.  Summary of this visit's results, reviewed by myself:  Labs:  Results for orders placed during the hospital encounter of 09/17/13 (from the past 24 hour(s))  DIGOXIN LEVEL     Status: Abnormal   Collection Time    09/17/13  4:20 AM      Result Value Range   Digoxin Level <0.3 (*) 0.8 - 2.0 ng/mL  CBC WITH DIFFERENTIAL     Status: None   Collection Time    09/17/13  4:20 AM      Result Value Range   WBC 9.4  4.0 - 10.5 K/uL   RBC 4.92  4.22 - 5.81 MIL/uL   Hemoglobin 15.3  13.0 - 17.0 g/dL   HCT 46.9  62.9 - 52.8 %   MCV 91.3  78.0 - 100.0 fL   MCH 31.1  26.0 - 34.0 pg   MCHC 34.1  30.0 - 36.0 g/dL   RDW 41.3  24.4 - 01.0 %   Platelets 196  150 - 400 K/uL   Neutrophils Relative % 66  43 - 77 %   Neutro Abs 6.3  1.7 - 7.7 K/uL   Lymphocytes Relative 21  12 - 46 %   Lymphs Abs 2.0  0.7 - 4.0 K/uL    Monocytes Relative 9  3 - 12 %   Monocytes Absolute 0.8  0.1 - 1.0 K/uL   Eosinophils Relative 3  0 - 5 %   Eosinophils Absolute 0.3  0.0 - 0.7 K/uL   Basophils Relative 1  0 - 1 %   Basophils Absolute 0.1  0.0 - 0.1 K/uL  BASIC METABOLIC PANEL     Status: Abnormal   Collection Time    09/17/13  4:20 AM      Result Value Range   Sodium 131 (*) 135 - 145 mEq/L   Potassium 4.5  3.5 - 5.1 mEq/L   Chloride 97  96 - 112 mEq/L   CO2 26  19 - 32 mEq/L   Glucose, Bld 374 (*) 70 - 99 mg/dL   BUN 13  6 - 23 mg/dL   Creatinine, Ser 7.37  0.50 - 1.35 mg/dL   Calcium 9.2  8.4 - 10.6 mg/dL   GFR calc non Af Amer 86 (*) >90 mL/min   GFR calc Af Amer >90  >90 mL/min  POCT I-STAT TROPONIN I     Status: None   Collection Time    09/17/13  4:24 AM      Result Value Range   Troponin i, poc 0.00  0.00 - 0.08 ng/mL   Comment 3             Imaging Studies: No results found.    EKG Interpretation:  Date & Time: 09/17/2013 3:53 AM  Rate: 82  Rhythm: atrial fibrillation  QRS Axis: normal  Intervals: normal  ST/T Wave abnormalities: normal  Conduction Disutrbances:none  Narrative Interpretation: poor R wave progression  Old EKG Reviewed: Inferior infarct less apparent  6:03 AM Patient sleeping. Pain improved with sublingual nitroglycerin but when patient awakened he still complains of mild pain. We'll have him admitted to step down. Dr. [name] will admit.  Hanley Seamen, MD 09/17/13 [phone]

## 2013-09-17 NOTE — Progress Notes (Signed)
1018 - Patient refused SCDs.  Patient educated on the benefits of SCDs.  Patient refused.  SCD machine left in room with patient.

## 2013-09-17 NOTE — Progress Notes (Signed)
Patient's BP low (see flowsheet data).  Dr. [name] paged.  Returned page and gave order to hold the patient's 1000 dose of Lisinopril and to administer other scheduled medications.  Orders followed.

## 2013-09-17 NOTE — Progress Notes (Signed)
Dr. [name] returned page and gave orders for patient to receive Protonix 40 mg one table by mouth daily.  Order also given for Vicodin 5-325 mg one tablet by mouth every four hours as needed for pain.  Orders followed.

## 2013-09-18 LAB — BASIC METABOLIC PANEL
CO2: 28 mEq/L (ref 19–32)
Calcium: 9.1 mg/dL (ref 8.4–10.5)
Chloride: 96 mEq/L (ref 96–112)
Creatinine, Ser: 0.89 mg/dL (ref 0.50–1.35)
GFR calc Af Amer: >90 mL/min (ref >90)
Potassium: 4.2 mEq/L (ref 3.5–5.1)

## 2013-09-18 LAB — GLUCOSE, CAPILLARY
Glucose-Capillary: 152 mg/dL — ABNORMAL HIGH (ref 70–99)
Glucose-Capillary: 188 mg/dL — ABNORMAL HIGH (ref 70–99)
Glucose-Capillary: 257 mg/dL — ABNORMAL HIGH (ref 70–99)

## 2013-09-18 LAB — TROPONIN I
Troponin I: <0.3 ng/mL (ref <0.30)
Troponin I: <0.3 ng/mL (ref <0.30)

## 2013-09-18 NOTE — H&P (Signed)
[name], [name]               ACCOUNT NO.:  [id number]  MEDICAL RECORD NO.:  [id number]  LOCATION:  IC10                          FACILITY:  [facility]  PHYSICIAN:  Parag Dorton G. [name], MD   DATE OF BIRTH:  June 17, 1946  DATE OF ADMISSION:  09/17/2013 DATE OF DISCHARGE:  LH                             HISTORY & PHYSICAL   HISTORY OF PRESENT ILLNESS:  A 67 year old male admitted through the emergency room with a chief complaint of chest pain.  Apparently, the patient's chest pain was more of a pressure substernally, which began in early morning hours.  He came to the emergency room where he was evaluated.  He does have a history of ischemic cardiomyopathy, acute on chronic CHF, and atrial fibrillation as well as diabetes.  He describes pressure and got some relief from nitroglycerin, did not have any dyspnea.  Does have a 25% ejection fraction.  His electrocardiogram showed ST and T-wave abnormalities.  Troponin at 0.00.  He was admitted to step-down unit for further evaluation.  SOCIAL HISTORY:  The patient smokes daily.  Does not use alcohol.  FAMILY HISTORY:  Positive for coronary artery disease.  PAST MEDICAL HISTORY:  The patient has a history of coronary artery disease with previous MI.  Does have a low ejection fraction.  Has history of atrial fibrillation, peripheral vascular disease, hyperlipidemia, type 2 diabetes, degenerative disk disease, chronic systolic congestive heart failure with ejection fraction 25% to 30%.  PAST SURGICAL HISTORY: 1. Previous cholecystectomy. 2. Aortofemoral bypass graft. 3. Lumbar fusion. 4. Diskectomy.  ALLERGIES:  No known allergies.  REVIEW OF SYSTEMS:  HEENT:  Negative.  CARDIOPULMONARY:  The patient has had some chest pain through the night, but no cough or dyspnea.  GI:  No bowel irregularity or bleeding.  GU:  No dysuria or hematuria.  PHYSICAL EXAMINATION:  GENERAL:  Alert male. VITAL SIGNS:  Blood pressure 120/67, respirations 26,  pulse 82, temp 97.6. HEENT:  Eyes PERRLA.  TMs negative.  Oropharynx benign. NECK:  Supple.  No JVD or thyroid abnormalities. HEART:  Regular rhythm.  No murmurs. LUNGS:  Clear to P and A. ABDOMEN:  No palpable organs or masses. EXTREMITIES:  Minimal edema in extremities.  MEDICATION LIST: 1. Aspirin 81 mg daily. 2. Carvedilol 25 mg b.i.d. 3. Crestor 40 mg daily. 4. Digoxin 125 mcg daily. 5. Furosemide 40 mg daily. 6. Amaryl 2 mg daily. 7. Lorcet HD 5/500 every 4 hours p.r.n. for pain. 8. Isosorbide 30 mg daily. 9. Lisinopril 20 mg daily. 10.Metformin 1000 mg b.i.d. 11.Sublingual nitroglycerin p.r.n. 12.Potassium chloride 10 mEq daily. 13.Pradaxa 150 mg b.i.d. 14.Spironolactone 25 mg daily.  PLAN:  To continue to monitor the patient.  Continue to use p.r.n. nitroglycerin as needed.  We will continue to check troponins every 8 hours.     Zeshan Sena G. [name], MD     AGM/MEDQ  D:  09/17/2013  T:  09/18/2013  Job:  [id number]

## 2013-09-19 DIAGNOSIS — R079 Chest pain, unspecified: Secondary | ICD-10-CM

## 2013-09-19 LAB — GLUCOSE, CAPILLARY
Glucose-Capillary: 249 mg/dL — ABNORMAL HIGH (ref 70–99)
Glucose-Capillary: 281 mg/dL — ABNORMAL HIGH (ref 70–99)

## 2013-09-19 NOTE — Consult Note (Signed)
CARDIOLOGY CONSULT NOTE   Patient ID: [name] MRN: [id number] DOB/AGE: 67-Jan-1947 67 y.o.  Admit Date: 09/17/2013 Referring Physician: Megan Mans, MD Primary Physician: Alice Reichert, MD Consulting Cardiologist: Linus Mako MD Primary Cardiologist: Diona Browner MD ( formerly Dietrich Pates) Reason for Consultation: Chest Pain  Clinical Summary [name] is a 67 y.o.male known history of ischemic heart myopathy, status post CABG with multiple PCI's for CAD, most recent echocardiogram completed in September 2014 demonstrating an EF of 25%,paroxysmal atrial fibrillation, diabetes who was with complaints of chest pain described as pressure substernally occurring in the early morning hours. The patient was recently admitted and early September 2014 for complaints of atypical chest pain, with acute on chronic systolic CHF. Nitrates were added at that time.   Patient states he fell pressure nonradiating to his stomach arms not back or jaw. States "Every time I have chest pain I come to the hospital". He denies medical noncompliance, but admits to eatting salty foods to include hamburgers and potato chips, and other foods. He is very evasive concerning his dietary habits. He also could not tell me how long the pain lasted, or what he was doing when it occurred. His subjective history is very vague.  In ER he was mildly hypertensive with a blood pressure 152/95 heart rate 76 O2 sat 100%. The patient was afebrile. Troponins are negative x3. Digoxin level was low at less than 0.3. Blood glucose was elevated at 374. Chest x-ray revealed stable cardiomegaly with slight interval improvement in interstitial pulmonary edema. He was provided with nitroglycerin sublingual and pain was relieved. EKG revealed atrial fibrillation with inferior lateral T-wave flattening.   No Known Allergies  Medications Scheduled Medications: . aspirin EC  81 mg Oral Daily  . atorvastatin  20 mg Oral q1800  . carvedilol  25 mg  Oral BID WC  . dabigatran  150 mg Oral Q12H  . digoxin  125 mcg Oral Daily  . furosemide  40 mg Oral Daily  . glimepiride  2 mg Oral QAC breakfast  . isosorbide mononitrate  30 mg Oral Daily  . lisinopril  10 mg Oral Daily  . metFORMIN  1,000 mg Oral BID WC  . pantoprazole  40 mg Oral Daily  . potassium chloride  10 mEq Oral Daily  . spironolactone  25 mg Oral Daily      PRN Medications: HYDROcodone-acetaminophen, nitroGLYCERIN, nitroGLYCERIN   Past Medical History  Diagnosis Date  . Coronary atherosclerosis of native coronary artery     Ant. MI in 4/95; PTCA of 90% prox & distal LAD unsuccessful-->  Urgent CABG-4/95 TO Cx; 50% RCA; ant. HK; EF of 50-55%; 08/2008: 3-V disease plus a  95% LIMA stenosis, treated with DES; occlusion of SVG to CX; RCA SVG stenosis--> PCI with DES  2010: in-stent restenosis in the LIMA-->cutting balloon; EF of 35-40%;  07/2010: TO of SVG to     RCA-medical therapy advised; EF of 30-35% by echo in 12/2010  . Atrial fibrillation 1995    1995, 2009; bradycardia with beta blocker therapy  . Tobacco abuse     40-50 pack years; currently one pack per day  . Peripheral vascular disease 1995    Abdominal aortic obstruction 1995-->vascular surgery  . Hyperlipidemia   . Type 2 diabetes mellitus   . DDD (degenerative disc disease)     Discectomy and fusion at L4 and L5  . Alcohol use 1999    Excessive alcohol use- quit in 1999  . Chronic systolic heart failure  LVEF 25-30% 2011    Past Surgical History  Procedure Laterality Date  . Cholecystectomy  1995  . Lumbar fusion      +Discectomy;x2; L4 and L5  . Aorto-femoral bypass graft  1995    Family History  Problem Relation Age of Onset  . CAD Father     Social History [name] reports that he has been smoking Cigarettes.  He has been smoking about 0.50 packs per day. He has never used smokeless tobacco. [name] reports that he does not drink alcohol.  Review of Systems Otherwise reviewed  and negative except as outlined.  Physical Examination Blood pressure 110/82, pulse 65, temperature 97.4 F (36.3 C), temperature source Oral, resp. rate 20, height 5\' 9"  (1.753 m), weight 177 lb 7.5 oz (80.5 kg), SpO2 97.00%.  Intake/Output Summary (Last 24 hours) at 09/19/13 0931 Last data filed at 09/18/13 1843  Gross per 24 hour  Intake    600 ml  Output      0 ml  Net    600 ml    Telemetry:  HEENT: Conjunctiva and lids normal, oropharynx clear with moist mucosa. Neck: Supple, no elevated JVP or carotid bruits, no thyromegaly. Lungs: Clear to auscultation, nonlabored breathing at rest. Cardiac: Regular rate and rhythm, no S3 or significant systolic murmur, no pericardial rub. Abdomen: Soft, nontender, no hepatomegaly, bowel sounds present, no guarding or rebound. Extremities: No pitting edema, distal pulses 2+. Skin: Warm and dry. Musculoskeletal: No kyphosis. Neuropsychiatric: Alert and oriented x3, affect grossly appropriate.  Prior Cardiac Testing/Procedures  1. Echocardiogram 09/07/2013 Left ventricle: The cavity size was normal. There was mild concentric hypertrophy. Systolic function was severely reduced. The estimated ejection fraction was 25%. Diffuse hypokinesis. There is akinesis of the basal-midinferoseptal myocardium. There is akinesis of the distalanteroseptal and apical myocardium. The study is not technically sufficient to allow evaluation of LV diastolic function. - Aortic valve: Poorly visualized. Mildly calcified annulus. Trileaflet; moderately calcified leaflets. Cusp separation was at the lower limits of normal. No significant regurgitation. - Aortic root: The aortic root was mildly dilated. - Mitral valve: Calcified annulus. Mildly thickened leaflets . Trivial regurgitation. - Left atrium: The atrium was severely dilated. - Right atrium: The atrium was mildly to moderately dilated. Central venous pressure: 8mm Hg (est). - Tricuspid valve:  Trivial regurgitation. - Pulmonary arteries: Systolic pressure could not be accurately estimated. - Pericardium, extracardiac: There was no pericardial effusion.   2. Cardiac Cath 08/20/2010  CONCLUSION: 1. Coronary artery disease status post coronary artery bypass graft     surgery in 1995. 2. Severe native vessel disease with total occlusion of left anterior     descending artery, circumflex artery, and right coronary artery. 3. Patent LIMA graft to the LAD with 30% narrowing at the proximal     edge of the stent at the previous PCI site and diffuse disease in     the distal LAD, occluded vein graft to the circumflex system by     prior study, and new occlusion of the vein graft to the right     coronary artery at the site of the previous stent (stent     thrombosis) with collateral to the posterior descending Umeka Wrench via     the LAD. 4. LV dysfunction with anterolateral wall akinesis and inferior wall     hypokinesis and estimated fraction of 35-40% which has decreased     from prior study.  RECOMMENDATIONS:  The patient has occluded the vein graft to the  right coronary artery with stent thrombosis.  This does not appear favorable for revascularization percutaneously or surgically.  I would recommend continued medical therapy.  He does not have good target for surgical intervention.  Unfortunately, he continues to smoke and his outlook guarded.  If his pulse tolerate it well, we will add a beta-blocker to his current regimen.   Lab Results  Basic Metabolic Panel:  Recent Labs Lab 09/17/13 0420 09/18/13 0727  NA 131* 132*  K 4.5 4.2  CL 97 96  CO2 26 28  GLUCOSE 374* 211*  BUN 13 11  CREATININE 0.93 0.89  CALCIUM 9.2 9.1    CBC:  Recent Labs Lab 09/17/13 0420  WBC 9.4  NEUTROABS 6.3  HGB 15.3  HCT 44.9  MCV 91.3  PLT 196    Cardiac Enzymes:  Recent Labs Lab 09/17/13 0751 09/17/13 1517 09/17/13 2331 09/18/13 0727  TROPONINI <0.30 <0.30 <0.30  <0.30    BNP: Pending. (Last one 09/06/2013 1,610.)  Radiology: PORTABLE CHEST - 1 VIEW  Comparison: Prior radiograph from 09/06/2013  Findings: Median sternotomy wires of underlying surgical clips are unchanged. Cardiomegaly is stable. The current examination has been performed with a similar degree of lung inflation. There has been slight interval improvement in interstitial edema as compared to the prior examination. No new focal infiltrate. No frank pleural effusion. No pneumothorax. No acute osseous abnormality. IMPRESSION:  Stable cardiomegaly with slight interval improvement in interstitial pulmonary edema.    RUE:AVWUJW fibrillation with lateral Q wave and T-wave flattening.    Impression and Recommendations  1. Chest Pain: He has vague symptoms which he describes a pressure substernally nonradiating. He did not know when this started, and did not know how long they lasted, but apparently pain subsided with treatment in ER. He has been started on Imdur or on discharge but does not remember getting that prescription. He denies medical noncompliance. He has ongoing tobacco abuse and is on carvedilol. Question bronchospasms. No wheezes are heard during auscultation. Would continue nitrates as directed.   2. ICM: History of CABG and multiple PCI. Most recent cardiac catheterization was in 2011 recommendations for medical management. Patient did not have any target areas or vessels that were amenable to PCI. Consider adding Ranexa. He remains on digoxin, beta blocker, and ACE inhibitor. Blood pressure heart rate are currently well-controlled.  3. Diabetes: Not well controlled on admission. Level greater than 300. He is on metformin thousand milligrams twice a day, question dietary compliance. Last  hemoglobin A1c in early Sept was 9.8.Marland Kitchen Glucose is improved since admission.  4. Chronic Systolic CHF: X-ray actually shows improvement in interstitial pulmonary edema. However he  continues to eat salty foods at home. Since being in the hospital he is diuresis to this 1796 cc of fluid.on lasix 40 mg daily and spironolactone. His level was low on admission. Question compliance vs. need to increase dose to 0.25 mg daily.  5. Atrial fibrillation: Heart rate is well-controlled currently. He remains on dabigatran 150 mg every 12 hours.  6.  Ongoing tobacco abuse: He is vague about the amount he smokes, with most recent history of a minimum of one pack a day. He has been counseled on numerous occasions concerning cessation. He does not appear to be willing to quit.    Signed: Bettey Mare. [facility] Bishop NP [facility] 09/19/2013, 9:31 AM Co-Sign MD  Patient seen and discussed with NP Lyman Bishop, I agree with her documentation above. 67 yo male hx of ICM LVEF  25% admitted with chest pain. No evidence at this time of ACS. Second admission over the last few weeks with chest pain, at last admit was prescribed imdur as an additional antianginal medication but seems he did not start taking the medicine. He has complex coronary anatomy including occluded native vessels, and 2 occluded SVGs with a patent LIMA to LAD w/ distal LAD described as diffusely diseased. Last cath 2011 thought no targets for revascularization, continue medical therapy. Given symptoms, recommend lexiscan MPI to evaluate territories of ischemia. Pending on the territory at risk and overall event risk based on findings, he may or may not be a candidate for a repeat cath.

## 2013-09-19 NOTE — Progress Notes (Signed)
[name], [name]               ACCOUNT NO.:  [id number]  MEDICAL RECORD NO.:  [id number]  LOCATION:  A323                          FACILITY:  [facility]  PHYSICIAN:  Riva Sesma G. [name], MD   DATE OF BIRTH:  May 24, 1946  DATE OF PROCEDURE: DATE OF DISCHARGE:                                PROGRESS NOTE   SUBJECTIVE:  This patient was admitted with chest pain.  He still has some intermittent pain which responds to nitroglycerin.  He does have relatively normal troponins and is feeling some better.  He did have a chest x-ray, which showed some interstitial pulmonary edema and stable cardiomegaly.  OBJECTIVE:  VITAL SIGNS:  Blood pressure 120/70, respirations 28, pulse 81, temp 97.6. HEENT:  Eyes, PERRLA.  TMs negative.  Oropharynx benign. NECK:  Supple.  No JVD or thyroid abnormalities. HEART:  Regular rhythm.  No murmurs. LUNGS:  Clear to P and A. ABDOMEN:  No palpable organs or masses. EXTREMITIES:  The patient has minimal edema in extremities.  ASSESSMENT:  The patient was admitted with chest pain, thought possibly to be angina as he does have a history of coronary artery disease with previous myocardial infarction and low ejection fraction.  He does have diabetes type 2, degenerate disk disease, and systolic congestive heart failure with 25-30% ejection fraction.  PLAN:  To continue current regimen.  We will repeat EKG and chest x-ray.     Iori Gigante G. [name], MD     AGM/MEDQ  D:  09/18/2013  T:  09/19/2013  Job:  [id number]

## 2013-09-19 NOTE — Progress Notes (Signed)
Inpatient Diabetes Program Recommendations  AACE/ADA: New Consensus Statement on Inpatient Glycemic Control (2013)  Target Ranges:  Prepandial:   less than 140 mg/dL      Peak postprandial:   less than 180 mg/dL (1-2 hours)      Critically ill patients:  140 - 180 mg/dL   Hyperglycemia in hospital Please consider adding some Lantus to regimen.  Start with 10-15 units Also, please consider using correction insulin rather than the oral agents, as po intake may be variable. Can start with moderate correction tidwc and the HS scale.  Thank you, Lenor Coffin, RN, CNS, Diabetes Coordinator [phone])

## 2013-09-20 ENCOUNTER — Encounter (HOSPITAL_COMMUNITY): Payer: Self-pay

## 2013-09-20 ENCOUNTER — Observation Stay (HOSPITAL_COMMUNITY): Payer: Medicare Other

## 2013-09-20 DIAGNOSIS — R079 Chest pain, unspecified: Secondary | ICD-10-CM

## 2013-09-20 LAB — GLUCOSE, CAPILLARY
Glucose-Capillary: 246 mg/dL — ABNORMAL HIGH (ref 70–99)
Glucose-Capillary: 252 mg/dL — ABNORMAL HIGH (ref 70–99)

## 2013-09-20 MED ORDER — SODIUM CHLORIDE 0.9 % IJ SOLN
INTRAMUSCULAR | Status: AC
  Administered 2013-09-20: 10 mL via INTRAVENOUS
  Filled 2013-09-20: qty 10

## 2013-09-20 MED ORDER — INSULIN ASPART 100 UNIT/ML ~~LOC~~ SOLN: 0.0000 [IU] | Freq: Three times a day (TID) | SUBCUTANEOUS | Status: DC

## 2013-09-20 MED ORDER — TECHNETIUM TC 99M SESTAMIBI - CARDIOLITE
30.0000 | Freq: Once | INTRAVENOUS | Status: AC | PRN
  Administered 2013-09-20: 30 via INTRAVENOUS

## 2013-09-20 MED ORDER — TECHNETIUM TC 99M SESTAMIBI - CARDIOLITE
10.0000 | Freq: Once | INTRAVENOUS | Status: AC | PRN
  Administered 2013-09-20: 10 via INTRAVENOUS

## 2013-09-20 MED ORDER — INSULIN ASPART 100 UNIT/ML ~~LOC~~ SOLN: 0.0000 [IU] | Freq: Every day | SUBCUTANEOUS | Status: DC

## 2013-09-20 MED ORDER — INSULIN ASPART 100 UNIT/ML ~~LOC~~ SOLN
0.0000 [IU] | Freq: Three times a day (TID) | SUBCUTANEOUS | Status: DC
  Administered 2013-09-20: 8 [IU] via SUBCUTANEOUS

## 2013-09-20 MED ORDER — REGADENOSON 0.4 MG/5ML IV SOLN
INTRAVENOUS | Status: AC
  Administered 2013-09-20: 0.4 mg via INTRAVENOUS
  Filled 2013-09-20: qty 5

## 2013-09-20 NOTE — Progress Notes (Signed)
Lexiscan MPI reviewed, large anteroseptal scar. Moderate sized inferolateral defect with mild peri-infarct ischemia consistent with known occluded SVG grafts of the LCX and RCA. These SVGs are chronically occluded, and from prior evaluation in 2011 thought to be not amenable to revascularization. At this point in time recommend continued medical therapy, optimize antianginal therapy with long acting nitrate (patient reports did not start this medication at home). Over time could be started on a CCB as well as additional antianginal. Overall severe ischemic cardiomyopathy with severe native vessel and bypass graft disease not amenable to revascularization.      Dina Rich MD

## 2013-09-20 NOTE — Progress Notes (Signed)
UR Chart Review Completed  

## 2013-09-20 NOTE — Progress Notes (Signed)
Stress Lab Nurses Notes - [name]  [name] 09/20/2013 Reason for doing test: CAD and Chest Pain Type of test: [name] Nurse performing test: Parke Poisson, RN Nuclear Medicine Tech: [name] Echo Tech: Not Applicable MD performing test: Dr. [name] / [name] NP Family MD: Dr. [name] Test explained and consent signed: yes IV started: 20g jelco, Saline lock flushed, No redness or edema and Saline lock from floor Symptoms: Chest pressure & back pain Treatment/Intervention: None Reason test stopped: protocol completed After recovery IV was: No redness or edema and Saline Lock flushed Patient to return to Nuc. Med at : 9:30 Patient discharged: Transported back to room 323 via W/C Patient's Condition upon discharge was: stable Comments: During test BP 84/71 & HR 99.  Recovery BP 83/59 & HR 86.  Symptoms continue. Refused pain meds. [name] T

## 2013-09-20 NOTE — Progress Notes (Signed)
Patient being discharged home with instructions. Verbalizes understanding. IV cath removed and intact. No pain/swelling at site.

## 2013-09-20 NOTE — Progress Notes (Signed)
Inpatient Diabetes Program Recommendations  AACE/ADA: New Consensus Statement on Inpatient Glycemic Control (2013)  Target Ranges:  Prepandial:   less than 140 mg/dL      Peak postprandial:   less than 180 mg/dL (1-2 hours)      Critically ill patients:  140 - 180 mg/dL  Results for [name], [name] (MRN [id number]) as of 09/20/2013 12:33  Ref. Range 09/06/2013 08:07  Hemoglobin A1C Latest Range: <5.7 % 9.8 (H)   Results for [name], [name] (MRN [id number]) as of 09/20/2013 12:33  Ref. Range 09/19/2013 08:32 09/19/2013 11:36 09/19/2013 21:00 09/20/2013 07:42 09/20/2013 11:38  Glucose-Capillary Latest Range: 70-99 mg/dL 784 (H) 696 (H) 295 (H) 246 (H) 252 (H)   Inpatient Diabetes Program Recommendations Insulin - Basal: Please order Levemir 10 untis daily. Correction (SSI): Please order Novolog moderate correction scale ACHS.  Note: Patient has a history of diabetes and takes Amaryl 2 mg daily and Metformin 1000 mg BID as an outpatient for diabetes management.  Currently, patient is ordered to receive Amaryl 2 mg daily and Metformin 1000 mg BID for inpatient glycemic control.  Despite use of oral agents, blood glucose has ranged from 159-281 mg/dl over the past 24 hours.  Last A1C was 9.8% on 09/06/2013.  Please consider starting patient on low dose basal insulin as inpatient and continue as an outpatient.  Please order Levemir 10 units daily and also order Novolog correction scale.  Will continue to follow as an inpatient.  Thanks, [name], RN, MSN, CCRN Diabetes Coordinator Inpatient Diabetes Program [phone] (Team Pager) [phone] ([facility] office) [phone] [facility] office)

## 2013-09-20 NOTE — Progress Notes (Signed)
[facility] ROUNDING NOTE  Consulting cardiologist: [name], MD  Subjective:    Complaints of chronic back pain and chest pressure.  Objective:   Temp:  [97.1 F (36.2 C)-98.5 F (36.9 C)] 97.8 F (36.6 C) (09/30 0459) Pulse Rate:  [76-86] 77 (09/30 0459) Resp:  [20-22] 21 (09/30 0459) BP: (90-136)/(66-109) 116/82 mmHg (09/30 0459) SpO2:  [94 %-99 %] 94 % (09/30 0459) Last BM Date: 09/19/13  Filed Weights   09/17/13 0355 09/17/13 0648 09/18/13 0500  Weight: 170 lb (77.111 kg) 173 lb 15.1 oz (78.9 kg) 177 lb 7.5 oz (80.5 kg)    Intake/Output Summary (Last 24 hours) at 09/20/13 0933 Last data filed at 09/19/13 1748  Gross per 24 hour  Intake    240 ml  Output      0 ml  Net    240 ml    Telemetry: Atrial flutter, rates in the 80's to 90's  Exam:  General: No acute distress.  HEENT: Conjunctiva and lids normal, oropharynx clear.  Lungs: Clear to auscultation, nonlabored.  Cardiac: No elevated JVP or bruits. RRR, no gallop or rub.   Abdomen: Normoactive bowel sounds, nontender, nondistended.  Extremities: No pitting edema, distal pulses full.  Neuropsychiatric: Alert and oriented x3, affect appropriate.   Lab Results:  Basic Metabolic Panel:  CBC:  Recent Labs Lab 09/17/13 0420  WBC 9.4  HGB 15.3  HCT 44.9  MCV 91.3  PLT 196    Cardiac Enzymes:  Recent Labs Lab 09/17/13 1517 09/17/13 2331 09/18/13 0727  TROPONINI <0.30 <0.30 <0.30    BNP:  Recent Labs  09/06/13 0807  PROBNP 4612.0*        Medications:   Scheduled Medications: . aspirin EC  81 mg Oral Daily  . atorvastatin  20 mg Oral q1800  . carvedilol  25 mg Oral BID WC  . dabigatran  150 mg Oral Q12H  . digoxin  125 mcg Oral Daily  . furosemide  40 mg Oral Daily  . glimepiride  2 mg Oral QAC breakfast  . isosorbide mononitrate  30 mg Oral Daily  . lisinopril  10 mg Oral Daily  . metFORMIN  1,000 mg Oral BID WC  . pantoprazole  40 mg Oral Daily  . potassium  chloride  10 mEq Oral Daily  . spironolactone  25 mg Oral Daily     Infusions:     PRN Medications:  HYDROcodone-acetaminophen, nitroGLYCERIN, nitroGLYCERIN, technetium sestamibi   Assessment and Plan:   1. Chest Pain: Now constant while up and about with associated fatigue. May be related to chronic back discomfort. Continue carvedilol and Imdur. Lexiscan completed this am.  More recommendations once study is read to ascertain for ischemia in the LAD territory.  2. Atrial fibrillation: Now in atrial flutter, or course atrial fib this am. He continues rate controlled and on dabigatran..  3. ICM: History of CABG and multiple PCI. Most recent cardiac catheterization was in 2011 recommendations for medical management. Patient did not have any target areas or vessels that were amenable to PCI.  4. Chronic Systolic CHF: Since being in the hospital he is diuresed ilasix 40 mg daily and spironolactone, but weight is not reflective of it, actually per records he has gained 7 lbs. He does not appear to have overt fluid overload at present. Creatinine 0.89.     [name]. [facility] Bishop NP [facility] 09/20/2013, 9:33 AM  Attending Note Patient seen and discussed with NP Lyman Bishop and agree with her documentation  above. 67 yo male history of ICM LVEF 25%, prior CABG with last cath showing occluded native vessels with patent LIMA, occluded SVG to RCA and occluded SVG to LCX. Chest pain by history is mixed for cardiac etiology, but characteristics are worrisome enough given his history and repeat admission for chest pain to warrant further ischemic evaluation. Pending the results of his lexiscan, he may or may not benefit from a repeat catheterization. Cath 2011 showed anatomy that at that time was not amenable to revascularization. Will follow lexiscan to evaluate possible territories of ischemia and overall risk assessment for his CAD.   Dina Rich MD

## 2013-09-20 NOTE — H&P (Signed)
NAMELABIB, CWYNAR               ACCOUNT NO.:  [id number]  MEDICAL RECORD NO.:  [id number]  LOCATION:  A323                          FACILITY:  [facility]  PHYSICIAN:  Skylie Hiott G. [name], MD   DATE OF BIRTH:  02-10-1946  DATE OF ADMISSION:  09/17/2013 DATE OF DISCHARGE:  LH                             HISTORY & PHYSICAL   ADDENDUM:  ASSESSMENT:  The patient was admitted with substernal chest pain.  He does have ischemic cardiomyopathy.  PLAN: 1. To obtain troponin levels every 8 hours to rule out MI.  We will     obtain Cardiology consult. 2. Atrial fibrillation, control rate, continue Pradaxa. 3. Non insulin-dependent diabetes, continue metformin 1000 mg twice     daily.  Diabetic diet.  Continue to monitor sugars. 4. Chronic systolic congestive heart failure with low ejection     fraction.  Continue to monitor weight.  Continue Lasix.  Continue     to monitor chemistries.     [name] G. [name], MD     AGM/MEDQ  D:  09/19/2013  T:  09/20/2013  Job:  [id number]

## 2013-09-21 NOTE — Discharge Summary (Signed)
[name], [name]               ACCOUNT NO.:  [id number]  MEDICAL RECORD NO.:  [id number]  LOCATION:  A323                          FACILITY:  [facility]  PHYSICIAN:  Rondo Spittler G. [name], MD   DATE OF BIRTH:  06/23/1946  DATE OF ADMISSION:  09/17/2013 DATE OF DISCHARGE:  09/30/2014LH                              DISCHARGE SUMMARY   ADDENDUM:  This patient completed Lexiscan stress test today, which did not show any acute changes, but chronic scarring.  Cardiology was consulted and they feel that no acute intervention is needed at this time, but a symptomatic follow up with Cardiology and primary care should be done.  The patient was discharged on following medications; 1. Digoxin 0.125 mg daily. 2. Lorcet 5/500 every 6 hours p.r.n. for pain. 3. Glucophage 1000, one b.i.d. 4. Aldactone 25 mg daily. 5. Amaryl 2 mg daily. 6. Nitrostat 0.4 mg every 5 minutes as needed for chest pain. 7. Pradaxa 150 mg b.i.d. 8. Potassium chloride 10 mEq daily. 9. Crestor 40 mg daily. 10.Carvedilol 25 mg twice a day. 11.Aspirin 81 mg daily. 12.Furosemide 40 mg daily. 13.Imdur 30 mg daily. 14.Prinivil 20 mg daily.     Biviana Saddler G. [name], MD     AGM/MEDQ  D:  09/20/2013  T:  09/21/2013  Job:  [id number]

## 2013-09-21 NOTE — Discharge Summary (Signed)
NAMEMEGHAN, TIEMANN               ACCOUNT NO.:  [id number]  MEDICAL RECORD NO.:  [id number]  LOCATION:  A323                          FACILITY:  [facility]  PHYSICIAN:  Mckinleigh Schuchart G. [name], MD   DATE OF BIRTH:  [birth year]  DATE OF ADMISSION:  09/18/2013 DATE OF DISCHARGE:  09/30/2014LH                              DISCHARGE SUMMARY   DIAGNOSES: 1. Chest pain, atypical. 2. Ischemic cardiomyopathy. 3. Three-vessel coronary artery disease. 4. Atrial fibrillation. 5. Non insulin-dependent diabetes. 6. Acute on chronic systolic congestive heart failure. 7. Chronic degenerative disk disease, LS-spine.  CONDITION:  Stable at the time of his discharge.  HISTORY OF PRESENT ILLNESS:  This patient presented to the emergency room with chief complaint being chest pain.  This was more of a pressure substernally, which began in the early morning hours.  He came to the emergency room, he was evaluated.  He does have a history of ischemic cardiomyopathy, acute on chronic systolic congestive heart failure, chronic atrial fibrillation as well as non-insulin-dependent dependent diabetes.  He described the pressures relieves some from nitroglycerin. Electrocardiogram showed ST and T-wave abnormalities.  Troponin was 0.00.  He was admitted to step-down unit for further evaluation.  PHYSICAL EXAMINATION:  VITAL SIGNS:  Alert male with blood pressure 120/67, respirations 26, pulse 82, temp 97.6. HEENT:  Eyes; PERRLA.  TMs negative.  Oropharynx benign. NECK:  Supple.  No JVD or thyroid abnormalities. HEART:  Regular rhythm.  No murmurs. LUNGS:  Clear to P and A. ABDOMEN:  No palpable organs or masses. EXTREMITIES:  Free of edema.  LABORATORY DATA:  Chemistries; sodium 132, potassium 4.2, chloride 96, CO2 28, BUN 11, creatinine 0.89, calcium 9.1, GFR 87.  Troponins less than 0.30, glucoses range from 154-299.  Chest x-ray showed stable cardiomegaly with slight interval improvement in interstitial  pulmonary edema.  HOSPITAL COURSE:  The patient initial was placed in step-down unit, ICU. He was continued on following medications; aspirin 81 mg daily, Lipitor 20 mg daily, Coreg 25 mg b.i.d., Pradaxa 150 mg every 12 hours, Lanoxin 125 mcg daily, Lasix 40 mg daily, Amaryl 2 mg p.o. with breakfast, metformin a 1000 mg b.i.d., Imdur 20 mg daily, Prinivil 10 mg daily, pantoprazole 40 mg daily, potassium chloride 10 mEq daily, spironolactone 25 mg daily.  The patient was given p.r.n. nitroglycerin for pain and hydrocodone and acetaminophen 5/325 every 4 hours p.r.n. for pain.  The patient continued to have some substernal chest discomfort during the first few days of hospitalization.  This would occur intermittently.  He had cardiac markers done serially and troponin remained below 0.30.  His electrocardiogram did not change, he was seen in consultation by Cardiology, Dr. Lalla Brothers.  He had been diuresed effectively and was improving symptomatically.  Of note, he has severe native vessel disease with total occlusion of left anterior descending artery, circumflex artery, and right coronary artery, but patent graft to LAD.  They suggested adding a beta blocker.  I recommended Lexiscan MPI to evaluate territories of ischemia and pending all the findings, he may be a candidate for repeat catheterization.  These issues will be resolved today.     Demontrez Rindfleisch G. [name], MD  AGM/MEDQ  D:  09/20/2013  T:  09/21/2013  Job:  [id number]

## 2013-09-28 ENCOUNTER — Encounter: Payer: Medicare Other | Admitting: Cardiology

## 2013-09-28 NOTE — Progress Notes (Signed)
Clinical Summary [name] is a 67 y.o.male   1. Atypical chest pain - last cath 07/2010 showed severe native vessel disease, patent LIMA-LAD with occluded SVG to LCX and SVG to OM. No revasc targets, managed with medical therapy - admit 9/98/14 with chest pain - EKG no no ischemic changes, troponin negative - lexiscan showed scar with minimal inferolateral peri-infarct ischemia.  2. Ischemic cardiomyopathy - recent admit 9/28-9/30/14 with some evidence of volume overload, diureses  3. Afib   4. HL   Past Medical History  Diagnosis Date  . Coronary atherosclerosis of native coronary artery     Ant. MI in 4/95; PTCA of 90% prox & distal LAD unsuccessful-->  Urgent CABG-4/95 TO Cx; 50% RCA; ant. HK; EF of 50-55%; 08/2008: 3-V disease plus a  95% LIMA stenosis, treated with DES; occlusion of SVG to CX; RCA SVG stenosis--> PCI with DES  2010: in-stent restenosis in the LIMA-->cutting balloon; EF of 35-40%;  07/2010: TO of SVG to     RCA-medical therapy advised; EF of 30-35% by echo in 12/2010  . Atrial fibrillation 1995    1995, 2009; bradycardia with beta blocker therapy  . Tobacco abuse     40-50 pack years; currently one pack per day  . Peripheral vascular disease 1995    Abdominal aortic obstruction 1995-->vascular surgery  . Hyperlipidemia   . Type 2 diabetes mellitus   . DDD (degenerative disc disease)     Discectomy and fusion at L4 and L5  . Alcohol use 1999    Excessive alcohol use- quit in 1999  . Chronic systolic heart failure     LVEF 25-30% 2011     No Known Allergies   Current Outpatient Prescriptions  Medication Sig Dispense Refill  . aspirin EC 81 MG tablet Take 81 mg by mouth daily.      . carvedilol (COREG) 25 MG tablet TAKE ONE TABLET TWICE DAILY  60 tablet  3  . CRESTOR 40 MG tablet TAKE ONE (1) TABLET BY MOUTH EVERY DAY  30 tablet  5  . digoxin (LANOXIN) 0.125 MG tablet Take 125 mcg by mouth daily.        . furosemide (LASIX) 40 MG tablet Take 40  mg by mouth daily.      Marland Kitchen glimepiride (AMARYL) 2 MG tablet Take 1 tablet (2 mg total) by mouth daily before breakfast.  90 tablet  3  . hydrocodone-acetaminophen (LORCET-HD) 5-500 MG per capsule Take 1 capsule by mouth every 6 (six) hours as needed for pain.       . isosorbide mononitrate (IMDUR) 30 MG 24 hr tablet Take 1 tablet (30 mg total) by mouth daily.  30 tablet  5  . lisinopril (PRINIVIL,ZESTRIL) 20 MG tablet TAKE ONE (1) TABLET BY MOUTH EVERY DAY  30 tablet  6  . metFORMIN (GLUCOPHAGE) 1000 MG tablet TAKE ONE TABLET TWICE DAILY  60 tablet  6  . nitroGLYCERIN (NITROSTAT) 0.4 MG SL tablet Place 1 tablet (0.4 mg total) under the tongue every 5 (five) minutes as needed.  25 tablet  6  . potassium chloride (K-DUR) 10 MEQ tablet TAKE ONE (1) TABLET BY MOUTH EVERY DAY  30 tablet  6  . PRADAXA 150 MG CAPS TAKE ONE CAPSULE BY MOUTH TWICE A DAY  60 capsule  3  . spironolactone (ALDACTONE) 25 MG tablet Take 25 mg by mouth daily.         No current facility-administered medications for this visit.  Past Surgical History  Procedure Laterality Date  . Cholecystectomy  1995  . Lumbar fusion      +Discectomy;x2; L4 and L5  . Aorto-femoral bypass graft  1995     No Known Allergies    Family History  Problem Relation Age of Onset  . CAD Father      Social History [name] reports that he has been smoking Cigarettes.  He has been smoking about 0.50 packs per day. He has never used smokeless tobacco. [name] reports that he does not drink alcohol.   Review of Systems CONSTITUTIONAL: No weight loss, fever, chills, weakness or fatigue.  HEENT: Eyes: No visual loss, blurred vision, double vision or yellow sclerae.No hearing loss, sneezing, congestion, runny nose or sore throat.  SKIN: No rash or itching.  CARDIOVASCULAR:  RESPIRATORY: No shortness of breath, cough or sputum.  GASTROINTESTINAL: No anorexia, nausea, vomiting or diarrhea. No abdominal pain or blood.    GENITOURINARY: No burning on urination, no polyuria NEUROLOGICAL: No headache, dizziness, syncope, paralysis, ataxia, numbness or tingling in the extremities. No change in bowel or bladder control.  MUSCULOSKELETAL: No muscle, back pain, joint pain or stiffness.  LYMPHATICS: No enlarged nodes. No history of splenectomy.  PSYCHIATRIC: No history of depression or anxiety.  ENDOCRINOLOGIC: No reports of sweating, cold or heat intolerance. No polyuria or polydipsia.  Marland Kitchen   Physical Examination There were no vitals filed for this visit. There were no vitals filed for this visit.  Gen: resting comfortably, no acute distress HEENT: no scleral icterus, pupils equal round and reactive, no palptable cervical adenopathy,  CV Resp: Clear to auscultation bilaterally GI: abdomen is soft, non-tender, non-distended, normal bowel sounds, no hepatosplenomegaly MSK: extremities are warm, no edema.  Skin: warm, no rash Neuro:  no focal deficits Psych: appropriate affect   Diagnostic Studies Cath 07/2010 LM NL, LAD occl, LCX occl, RCA occl. LIMA to LAD patent. SVG to RCA occluded. SVG to LCX occluded. Poor revasc anatomy.   08/2013 Echo: LVEF 25%, severe LAE, multiple WMAs   Assessment and Plan        Antoine Poche, M.D., F.A.C.C.

## 2013-12-10 ENCOUNTER — Emergency Department (HOSPITAL_COMMUNITY): Payer: Medicare Other

## 2013-12-10 ENCOUNTER — Encounter (HOSPITAL_COMMUNITY): Payer: Self-pay | Admitting: Emergency Medicine

## 2013-12-10 ENCOUNTER — Inpatient Hospital Stay (HOSPITAL_COMMUNITY)
Admission: EM | Admit: 2013-12-10 | Discharge: 2013-12-16 | DRG: 280 | Disposition: A | Discharge status: Home / Self Care | Payer: Medicare Other | Attending: Cardiology | Admitting: Cardiology

## 2013-12-10 DIAGNOSIS — I739 Peripheral vascular disease, unspecified: Secondary | ICD-10-CM | POA: Diagnosis present

## 2013-12-10 DIAGNOSIS — Z951 Presence of aortocoronary bypass graft: Secondary | ICD-10-CM

## 2013-12-10 DIAGNOSIS — F17200 Nicotine dependence, unspecified, uncomplicated: Secondary | ICD-10-CM | POA: Diagnosis present

## 2013-12-10 DIAGNOSIS — E785 Hyperlipidemia, unspecified: Secondary | ICD-10-CM | POA: Diagnosis present

## 2013-12-10 DIAGNOSIS — R079 Chest pain, unspecified: Secondary | ICD-10-CM

## 2013-12-10 DIAGNOSIS — E119 Type 2 diabetes mellitus without complications: Secondary | ICD-10-CM | POA: Diagnosis present

## 2013-12-10 DIAGNOSIS — I2589 Other forms of chronic ischemic heart disease: Secondary | ICD-10-CM | POA: Diagnosis present

## 2013-12-10 DIAGNOSIS — I249 Acute ischemic heart disease, unspecified: Secondary | ICD-10-CM

## 2013-12-10 DIAGNOSIS — E871 Hypo-osmolality and hyponatremia: Secondary | ICD-10-CM | POA: Diagnosis present

## 2013-12-10 DIAGNOSIS — I252 Old myocardial infarction: Secondary | ICD-10-CM

## 2013-12-10 DIAGNOSIS — Z9119 Patient's noncompliance with other medical treatment and regimen: Secondary | ICD-10-CM

## 2013-12-10 DIAGNOSIS — I4891 Unspecified atrial fibrillation: Secondary | ICD-10-CM | POA: Diagnosis present

## 2013-12-10 DIAGNOSIS — Z7982 Long term (current) use of aspirin: Secondary | ICD-10-CM

## 2013-12-10 DIAGNOSIS — IMO0001 Reserved for inherently not codable concepts without codable children: Secondary | ICD-10-CM | POA: Diagnosis present

## 2013-12-10 DIAGNOSIS — K3189 Other diseases of stomach and duodenum: Secondary | ICD-10-CM | POA: Diagnosis not present

## 2013-12-10 DIAGNOSIS — Z91199 Patient's noncompliance with other medical treatment and regimen due to unspecified reason: Secondary | ICD-10-CM

## 2013-12-10 DIAGNOSIS — D6959 Other secondary thrombocytopenia: Secondary | ICD-10-CM | POA: Diagnosis present

## 2013-12-10 DIAGNOSIS — Z79899 Other long term (current) drug therapy: Secondary | ICD-10-CM

## 2013-12-10 DIAGNOSIS — I5023 Acute on chronic systolic (congestive) heart failure: Secondary | ICD-10-CM | POA: Diagnosis present

## 2013-12-10 DIAGNOSIS — Z9861 Coronary angioplasty status: Secondary | ICD-10-CM

## 2013-12-10 DIAGNOSIS — I214 Non-ST elevation (NSTEMI) myocardial infarction: Principal | ICD-10-CM | POA: Diagnosis present

## 2013-12-10 DIAGNOSIS — Z981 Arthrodesis status: Secondary | ICD-10-CM

## 2013-12-10 DIAGNOSIS — I509 Heart failure, unspecified: Secondary | ICD-10-CM | POA: Diagnosis present

## 2013-12-10 DIAGNOSIS — Z7901 Long term (current) use of anticoagulants: Secondary | ICD-10-CM

## 2013-12-10 DIAGNOSIS — I2510 Atherosclerotic heart disease of native coronary artery without angina pectoris: Secondary | ICD-10-CM | POA: Diagnosis present

## 2013-12-10 HISTORY — DX: Patient's noncompliance with other medical treatment and regimen due to unspecified reason: Z91.199

## 2013-12-10 HISTORY — DX: Patient's noncompliance with other medical treatment and regimen: Z91.19

## 2013-12-10 LAB — CBC WITH DIFFERENTIAL/PLATELET
Basophils Absolute: 0 10*3/uL (ref 0.0–0.1)
Basophils Absolute: 0 10*3/uL (ref 0.0–0.1)
Basophils Relative: 0 % (ref 0–1)
Basophils Relative: 0 % (ref 0–1)
Eosinophils Absolute: 0 10*3/uL (ref 0.0–0.7)
Eosinophils Relative: 0 % (ref 0–5)
Eosinophils Relative: 1 % (ref 0–5)
HCT: 48 % (ref 39.0–52.0)
Hemoglobin: 16.8 g/dL (ref 13.0–17.0)
Lymphocytes Relative: 11 % — ABNORMAL LOW (ref 12–46)
Lymphs Abs: 1.3 10*3/uL (ref 0.7–4.0)
Lymphs Abs: 2.1 10*3/uL (ref 0.7–4.0)
MCH: 30.8 pg (ref 26.0–34.0)
MCHC: 34.2 g/dL (ref 30.0–36.0)
MCHC: 35 g/dL (ref 30.0–36.0)
MCV: 90 fL (ref 78.0–100.0)
Monocytes Absolute: 0.7 10*3/uL (ref 0.1–1.0)
Monocytes Absolute: 0.9 10*3/uL (ref 0.1–1.0)
Monocytes Relative: 7 % (ref 3–12)
Neutro Abs: 9.7 10*3/uL — ABNORMAL HIGH (ref 1.7–7.7)
Neutrophils Relative %: 83 % — ABNORMAL HIGH (ref 43–77)
Neutrophils Relative %: 75 % (ref 43–77)
Platelets: 79 10*3/uL — ABNORMAL LOW (ref 150–400)
Platelets: 117 10*3/uL — ABNORMAL LOW (ref 150–400)
RDW: 14.5 % (ref 11.5–15.5)
RDW: 14.4 % (ref 11.5–15.5)
Smear Review: ADEQUATE
WBC: 12 10*3/uL — ABNORMAL HIGH (ref 4.0–10.5)
WBC: 12.9 10*3/uL — ABNORMAL HIGH (ref 4.0–10.5)

## 2013-12-10 LAB — PROTIME-INR: Prothrombin Time: 14.4 seconds (ref 11.6–15.2)

## 2013-12-10 LAB — BASIC METABOLIC PANEL
CO2: 24 mEq/L (ref 19–32)
Calcium: 8.9 mg/dL (ref 8.4–10.5)
Creatinine, Ser: 0.96 mg/dL (ref 0.50–1.35)
GFR calc non Af Amer: 84 mL/min — ABNORMAL LOW (ref >90)
Glucose, Bld: 577 mg/dL (ref 70–99)
Sodium: 126 mEq/L — ABNORMAL LOW (ref 135–145)

## 2013-12-10 LAB — MRSA PCR SCREENING: MRSA by PCR: NEGATIVE

## 2013-12-10 LAB — PRO B NATRIURETIC PEPTIDE: Pro B Natriuretic peptide (BNP): 9452 pg/mL — ABNORMAL HIGH (ref 0–125)

## 2013-12-10 LAB — TROPONIN I: Troponin I: 1.93 ng/mL (ref <0.30)

## 2013-12-10 LAB — DIGOXIN LEVEL: Digoxin Level: <0.3 ng/mL — ABNORMAL LOW (ref 0.8–2.0)

## 2013-12-10 LAB — APTT: aPTT: 27 seconds (ref 24–37)

## 2013-12-10 MED ORDER — SPIRONOLACTONE 25 MG PO TABS
25.0000 mg | ORAL_TABLET | Freq: Every day | ORAL | Status: DC
  Administered 2013-12-10 – 2013-12-16 (×7): 25 mg via ORAL
  Filled 2013-12-10 (×7): qty 1

## 2013-12-10 MED ORDER — DABIGATRAN ETEXILATE MESYLATE 150 MG PO CAPS
150.0000 mg | ORAL_CAPSULE | Freq: Two times a day (BID) | ORAL | Status: DC
  Administered 2013-12-10 – 2013-12-16 (×12): 150 mg via ORAL
  Filled 2013-12-10 (×13): qty 1

## 2013-12-10 MED ORDER — ACETAMINOPHEN 325 MG PO TABS: 650.0000 mg | ORAL_TABLET | ORAL | Status: DC | PRN

## 2013-12-10 MED ORDER — ASPIRIN EC 81 MG PO TBEC
81.0000 mg | DELAYED_RELEASE_TABLET | Freq: Every day | ORAL | Status: DC
  Administered 2013-12-10 – 2013-12-16 (×7): 81 mg via ORAL
  Filled 2013-12-10 (×7): qty 1

## 2013-12-10 MED ORDER — FUROSEMIDE 10 MG/ML IJ SOLN
INTRAMUSCULAR | Status: AC
  Filled 2013-12-10: qty 4

## 2013-12-10 MED ORDER — HEPARIN (PORCINE) IN NACL 100-0.45 UNIT/ML-% IJ SOLN: 750.0000 [IU]/h | INTRAMUSCULAR | Status: DC

## 2013-12-10 MED ORDER — POTASSIUM CHLORIDE ER 10 MEQ PO TBCR
10.0000 meq | EXTENDED_RELEASE_TABLET | Freq: Every day | ORAL | Status: DC
  Administered 2013-12-10 – 2013-12-11 (×2): 10 meq via ORAL
  Filled 2013-12-10 (×3): qty 1

## 2013-12-10 MED ORDER — LISINOPRIL 20 MG PO TABS
20.0000 mg | ORAL_TABLET | Freq: Every day | ORAL | Status: DC
  Administered 2013-12-11 – 2013-12-12 (×2): 20 mg via ORAL
  Filled 2013-12-10 (×2): qty 1

## 2013-12-10 MED ORDER — METFORMIN HCL 500 MG PO TABS
1000.0000 mg | ORAL_TABLET | Freq: Two times a day (BID) | ORAL | Status: DC
  Administered 2013-12-11 – 2013-12-16 (×8): 1000 mg via ORAL
  Filled 2013-12-10 (×14): qty 2

## 2013-12-10 MED ORDER — CARVEDILOL 25 MG PO TABS
25.0000 mg | ORAL_TABLET | Freq: Two times a day (BID) | ORAL | Status: DC
  Administered 2013-12-10 – 2013-12-12 (×3): 25 mg via ORAL
  Filled 2013-12-10 (×6): qty 1

## 2013-12-10 MED ORDER — INSULIN ASPART 100 UNIT/ML ~~LOC~~ SOLN
0.0000 [IU] | Freq: Three times a day (TID) | SUBCUTANEOUS | Status: DC
  Administered 2013-12-11: 11 [IU] via SUBCUTANEOUS
  Administered 2013-12-12 (×2): 3 [IU] via SUBCUTANEOUS

## 2013-12-10 MED ORDER — ATORVASTATIN CALCIUM 40 MG PO TABS
40.0000 mg | ORAL_TABLET | Freq: Every day | ORAL | Status: DC
  Administered 2013-12-11 – 2013-12-15 (×4): 40 mg via ORAL
  Filled 2013-12-10 (×6): qty 1

## 2013-12-10 MED ORDER — INSULIN ASPART 100 UNIT/ML ~~LOC~~ SOLN
5.0000 [IU] | Freq: Once | SUBCUTANEOUS | Status: AC
  Administered 2013-12-10: 5 [IU] via INTRAVENOUS
  Filled 2013-12-10: qty 1

## 2013-12-10 MED ORDER — HEPARIN BOLUS VIA INFUSION: 3000.0000 [IU] | Freq: Once | INTRAVENOUS | Status: DC

## 2013-12-10 MED ORDER — FUROSEMIDE 10 MG/ML IJ SOLN
40.0000 mg | Freq: Two times a day (BID) | INTRAMUSCULAR | Status: DC
  Administered 2013-12-10 – 2013-12-11 (×2): 40 mg via INTRAVENOUS
  Filled 2013-12-10 (×2): qty 4

## 2013-12-10 MED ORDER — MORPHINE SULFATE 2 MG/ML IJ SOLN
2.0000 mg | Freq: Once | INTRAMUSCULAR | Status: AC
  Administered 2013-12-10: 2 mg via INTRAVENOUS
  Filled 2013-12-10: qty 1

## 2013-12-10 MED ORDER — GLIMEPIRIDE 2 MG PO TABS
2.0000 mg | ORAL_TABLET | Freq: Every day | ORAL | Status: DC
  Administered 2013-12-11 – 2013-12-16 (×6): 2 mg via ORAL
  Filled 2013-12-10 (×7): qty 1

## 2013-12-10 MED ORDER — ISOSORBIDE MONONITRATE ER 30 MG PO TB24
30.0000 mg | ORAL_TABLET | Freq: Every day | ORAL | Status: DC
  Administered 2013-12-10 – 2013-12-16 (×7): 30 mg via ORAL
  Filled 2013-12-10 (×7): qty 1

## 2013-12-10 MED ORDER — INSULIN ASPART 100 UNIT/ML ~~LOC~~ SOLN
5.0000 [IU] | Freq: Once | SUBCUTANEOUS | Status: AC
  Administered 2013-12-10: 5 [IU] via INTRAVENOUS

## 2013-12-10 MED ORDER — NITROGLYCERIN 0.4 MG SL SUBL
0.4000 mg | SUBLINGUAL_TABLET | SUBLINGUAL | Status: DC | PRN
  Administered 2013-12-14: 0.4 mg via SUBLINGUAL
  Filled 2013-12-10: qty 25

## 2013-12-10 MED ORDER — FUROSEMIDE 10 MG/ML IJ SOLN
40.0000 mg | Freq: Once | INTRAMUSCULAR | Status: AC
  Administered 2013-12-10: 40 mg via INTRAVENOUS
  Filled 2013-12-10: qty 4

## 2013-12-10 MED ORDER — DIGOXIN 125 MCG PO TABS
125.0000 ug | ORAL_TABLET | Freq: Every day | ORAL | Status: DC
  Administered 2013-12-10 – 2013-12-16 (×7): 125 ug via ORAL
  Filled 2013-12-10 (×7): qty 1

## 2013-12-10 MED ORDER — ONDANSETRON HCL 4 MG/2ML IJ SOLN: 4.0000 mg | Freq: Four times a day (QID) | INTRAMUSCULAR | Status: DC | PRN

## 2013-12-10 MED ORDER — HYDROCODONE-ACETAMINOPHEN 5-325 MG PO TABS: 1.0000 | ORAL_TABLET | Freq: Four times a day (QID) | ORAL | Status: DC | PRN

## 2013-12-10 NOTE — ED Notes (Signed)
Complain of chest pain and being SOB that started this morning. Cannot say exactly what time

## 2013-12-10 NOTE — ED Notes (Signed)
Dr. Adriana Simas aware of pt's Plt count and ordered to hold heparin gtt.

## 2013-12-10 NOTE — ED Provider Notes (Signed)
CSN: [id number]     Arrival date & time 12/10/13  1235 History  This chart was scribed for Donnetta Hutching, MD by Bennett Scrape, ED Scribe. This patient was seen in room APA10/APA10 and the patient's care was started at 1:23 PM.   Chief Complaint  Patient presents with  . Shortness of Breath    The history is provided by the patient. No language interpreter was used.    HPI Comments:  Level V caveat for urgent need for intervention [name] is a 67 y.o. male who presents to the Emergency Department complaining of SOB with associated central CP that started today. He is unwilling to answer any further descriptive questions. He denies being bedridden but is unwilling to answer questions about exertion. Pt admits that he stopped taking his medication one week ago stating "I just quit". He has a significant cardiac history including a bypass in 1995 and CHF. He denies any other complaints currently.  PCP is Dr. [name].  Past Medical History  Diagnosis Date  . Coronary atherosclerosis of native coronary artery     Ant. MI in 4/95; PTCA of 90% prox & distal LAD unsuccessful-->  Urgent CABG-4/95 TO Cx; 50% RCA; ant. HK; EF of 50-55%; 08/2008: 3-V disease plus a  95% LIMA stenosis, treated with DES; occlusion of SVG to CX; RCA SVG stenosis--> PCI with DES  2010: in-stent restenosis in the LIMA-->cutting balloon; EF of 35-40%;  07/2010: TO of SVG to     RCA-medical therapy advised; EF of 30-35% by echo in 12/2010  . Atrial fibrillation 1995    1995, 2009; bradycardia with beta blocker therapy  . Tobacco abuse     40-50 pack years; currently one pack per day  . Peripheral vascular disease 1995    Abdominal aortic obstruction 1995-->vascular surgery  . Hyperlipidemia   . Type 2 diabetes mellitus   . DDD (degenerative disc disease)     Discectomy and fusion at L4 and L5  . Alcohol use 1999    Excessive alcohol use- quit in 1999  . Chronic systolic heart failure     LVEF 25-30% 2011   Past  Surgical History  Procedure Laterality Date  . Cholecystectomy  1995  . Lumbar fusion      +Discectomy;x2; L4 and L5  . Aorto-femoral bypass graft  1995   Family History  Problem Relation Age of Onset  . CAD Father    History  Substance Use Topics  . Smoking status: Current Every Day Smoker -- 0.50 packs/day    Types: Cigarettes  . Smokeless tobacco: Never Used  . Alcohol Use: No     Comment: former    Review of Systems  A complete 10 system review of systems was obtained and all systems are negative except as noted in the HPI and PMH.   Allergies  Review of patient's allergies indicates no known allergies.  Home Medications   Current Outpatient Rx  Name  Route  Sig  Dispense  Refill  . aspirin EC 81 MG tablet   Oral   Take 81 mg by mouth daily.         . carvedilol (COREG) 25 MG tablet      TAKE ONE TABLET TWICE DAILY   60 tablet   3   . CRESTOR 40 MG tablet      TAKE ONE (1) TABLET BY MOUTH EVERY DAY   30 tablet   5     PATIENT IS GOING OUT  OF TOWN   . digoxin (LANOXIN) 0.125 MG tablet   Oral   Take 125 mcg by mouth daily.           . furosemide (LASIX) 40 MG tablet   Oral   Take 40 mg by mouth daily.         Marland Kitchen glimepiride (AMARYL) 2 MG tablet   Oral   Take 1 tablet (2 mg total) by mouth daily before breakfast.   90 tablet   3   . hydrocodone-acetaminophen (LORCET-HD) 5-500 MG per capsule   Oral   Take 1 capsule by mouth every 6 (six) hours as needed for pain.          . isosorbide mononitrate (IMDUR) 30 MG 24 hr tablet   Oral   Take 1 tablet (30 mg total) by mouth daily.   30 tablet   5   . lisinopril (PRINIVIL,ZESTRIL) 20 MG tablet      TAKE ONE (1) TABLET BY MOUTH EVERY DAY   30 tablet   6   . metFORMIN (GLUCOPHAGE) 1000 MG tablet      TAKE ONE TABLET TWICE DAILY   60 tablet   6   . nitroGLYCERIN (NITROSTAT) 0.4 MG SL tablet   Sublingual   Place 1 tablet (0.4 mg total) under the tongue every 5 (five) minutes as  needed.   25 tablet   6   . potassium chloride (K-DUR) 10 MEQ tablet      TAKE ONE (1) TABLET BY MOUTH EVERY DAY   30 tablet   6   . PRADAXA 150 MG CAPS      TAKE ONE CAPSULE BY MOUTH TWICE A DAY   60 capsule   3   . spironolactone (ALDACTONE) 25 MG tablet   Oral   Take 25 mg by mouth daily.            Triage Vitals: BP 129/88  Pulse 96  Resp 20  Ht 5\' 9"  (1.753 m)  Wt 140 lb (63.504 kg)  BMI 20.67 kg/m2  Physical Exam  Nursing note and vitals reviewed. Constitutional: He is oriented to person, place, and time. He appears well-developed and well-nourished.  Disheveled appearing, extremely poor hygiene  HENT:  Head: Normocephalic and atraumatic.  Eyes: Conjunctivae and EOM are normal. Pupils are equal, round, and reactive to light.  Neck: Normal range of motion. Neck supple.  Cardiovascular: Normal rate, regular rhythm and normal heart sounds.   Pulmonary/Chest: Effort normal and breath sounds normal.  Abdominal: Soft. Bowel sounds are normal.  Musculoskeletal: Normal range of motion. He exhibits edema (2+ peripheral edema).  Neurological: He is alert and oriented to person, place, and time.  Skin: Skin is warm and dry.  Psychiatric: He has a normal mood and affect. His behavior is normal.    ED Course  Procedures (including critical care time)  DIAGNOSTIC STUDIES: Oxygen Saturation is 94 % [normal] by my interpretation.    COORDINATION OF CARE: 1:26 PM-Discussed treatment plan which includes CXR, CBC panel, BMP and troponin with pt at bedside and pt agreed to plan.   Labs Review Labs Reviewed  CBC WITH DIFFERENTIAL - Abnormal; Notable for the following:    WBC 12.0 (*)    Hemoglobin 17.6 (*)    Platelets 79 (*)    Neutrophils Relative % 83 (*)    Lymphocytes Relative 11 (*)    Neutro Abs 10.0 (*)    All other components within normal limits  BASIC METABOLIC  PANEL - Abnormal; Notable for the following:    Sodium 126 (*)    Chloride 89 (*)     Glucose, Bld 577 (*)    GFR calc non Af Amer 84 (*)    All other components within normal limits  PRO B NATRIURETIC PEPTIDE - Abnormal; Notable for the following:    Pro B Natriuretic peptide (BNP) 9452.0 (*)    All other components within normal limits  TROPONIN I - Abnormal; Notable for the following:    Troponin I 1.93 (*)    All other components within normal limits   Imaging Review Dg Chest Portable 1 View  12/10/2013   CLINICAL DATA:  Dyspnea  EXAM: PORTABLE CHEST - 1 VIEW  COMPARISON:  portable chest x-ray dated September 17, 2013.  FINDINGS: The lungs are well-expanded. The interstitial markings are increased diffusely and are more conspicuous than in the past. The cardiopericardial silhouette is enlarged and the pulmonary vascularity is engorged. The patient has undergone previous CABG. There is no significant pleural effusion. There is no pneumothorax. The observed portions of the bony thorax appear normal.  IMPRESSION: The findings are consistent with congestive heart failure which is likely both acute and chronic. There is interstitial edema but no alveolar edema.   Electronically Signed   By: David  [place]   On: 12/10/2013 13:07     Date: 12/10/2013  Rate: 100  Rhythm: atrial fibrillation   QRS Axis: normal  Intervals: normal  ST/T Wave abnormalities: normal  Conduction Disutrbances:none  Narrative Interpretation:   Old EKG Reviewed: changes noted  CRITICAL CARE Performed by: Donnetta Hutching Total critical care time: 30 Critical care time was exclusive of separately billable procedures and treating other patients. Critical care was necessary to treat or prevent imminent or life-threatening deterioration. Critical care was time spent personally by me on the foPatient has a non-ST segment elevation EKGllowirevealsng activities: development of treatment plan with patient and/or surrogate as well as nursing, discussions with consultants, evaluation of patient's response to  treatment, examination of patient, obtaining history from patient or surrogate, ordering and performing treatments and interventions, ordering and review of laboratory studies, ordering and review of radiographic studies, pulse oximetry and re-evaluation of patient's condition. MDM  No diagnosis found. EKG reveals no ST segment changes.  Trop elevated 1.93     Glucose 577 with normal CO2.   CXR shows edema.   IV Lasix, IV heparin.  Discussed c cardiologist Dr [name].    Transfer to [facility]  I personally performed the services described in this documentation, which was scribed in my presence. The recorded information has been reviewed and is accurate.    Donnetta Hutching, MD 12/10/13 [phone]

## 2013-12-10 NOTE — ED Notes (Signed)
Scoot with [facility] received report, ETA of 30-35 min.

## 2013-12-10 NOTE — H&P (Signed)
[name] is an 67 y.o. male.    Primary Cardiologist: Dr. Doristine Devoid  Chief Complaint: Chest pain and lower extremity swelling  HPI: 67 y/o male with PMH of AFIB, uncontrolled DM, CHF, LVEF 25% by TTE from 09/07/2013, CAD s/p CABG x 3 with LIMA-->LAD, SVG-->RCA and SVG-->LCX. His last cardiac cath was reportedly in 2011, and it showed native 3-vessel CAD with patent LIMA-->LAD but chronically occluded SVG-->RCA and SVG-->LCX. He was last admitted for chest pain 08/2013. He underwent a nuclear stress test 09/19/2013 which showed large fixed anteroseptal scar and moderate size defect in inferolateral wall, LVEF 29%. He was seen by Dr. Wayne Sever who determined that the area of ischemia where in the RCA and LCX area (where he had chronically occluded grafts); in addition, there were no native vessels amenable to intervention.  Thus, medical therapy was recommended.  Since then, he has been on Coreg, Imdur, Prinivil, Spironolactone, SL NTG and Lipitor.  He states that since discharge from hospital 09/19/2013, he has been experiencing intermittent chest discomfort about 2 times/month that is usually relieved by SL NTG.   He stopped taking his medication about 1 week ago because "I didn't want to take it anymore". Since then, he has been experiencing recurrent chest pain and bilateral lower extremity swelling. Last noted weight 09/18/2013 was 80.5 kg.  He presented for further evaluation due to chest pain which he describes as a pressure pain 3/10 in severity, but he denies nausea/vomiting, shortness of breath, dizziness or syncope.  He has chronic dyspnea on exertion after walking a few bloods.  He is currently asymptomatic. EKG obtained 12/10/2013 at 19:22 showed AFIB with heart rate 104 bpm; there is no ST-elevation on this electrocardiogram. Troponin I is 1.93. Patient is hemodynamically stable. He denies suicidal or homicidal ideation and does not want to be seen by a  psychiatrist.   Transthoracic Echo 09/07/2013 Impressions: - Comparison to prior study September 2011. Mild LVH with LVEF approximately 25%, wall motion abnormalities consistent with ischemic cardiomyopathy. Indeterminate diastolic function. Severe left atrial enlargement. Poorly visualized sclerotic aortic valve. MAC with trivial mitral regurgitation. Trivial tricuspid regurgitation. Mild to moderate right atrial enlargement. Unable to assess PASP.    Nuclear Stress Test 09/19/2013 IMPRESSION:  High risk study for major cardiac events due to low ejection  fraction and multiple perfusion defects. There is a large fixed  anteroseptal scar. There is a moderate sized defect in the  inferolateral wall with minimal myocardium at jeopardy. There were  no diagnostic ST-segment abnormalities. No chest pain reported.  LVEF 29%  Past Medical History  Diagnosis Date  . Coronary atherosclerosis of native coronary artery     Ant. MI in 4/95; PTCA of 90% prox & distal LAD unsuccessful-->  Urgent CABG-4/95 TO Cx; 50% RCA; ant. HK; EF of 50-55%; 08/2008: 3-V disease plus a  95% LIMA stenosis, treated with DES; occlusion of SVG to CX; RCA SVG stenosis--> PCI with DES  2010: in-stent restenosis in the LIMA-->cutting balloon; EF of 35-40%;  07/2010: TO of SVG to     RCA-medical therapy advised; EF of 30-35% by echo in 12/2010  . Atrial fibrillation 1995    1995, 2009; bradycardia with beta blocker therapy  . Tobacco abuse     40-50 pack years; currently one pack per day  . Peripheral vascular disease 1995    Abdominal aortic obstruction 1995-->vascular surgery  . Hyperlipidemia   . Type 2 diabetes mellitus   . DDD (degenerative disc disease)  Discectomy and fusion at L4 and L5  . Alcohol use 1999    Excessive alcohol use- quit in 1999  . Chronic systolic heart failure     LVEF 25-30% 2011    Past Surgical History  Procedure Laterality Date  . Cholecystectomy  1995  . Lumbar fusion       +Discectomy;x2; L4 and L5  . Aorto-femoral bypass graft  1995    Family History  Problem Relation Age of Onset  . CAD Father    Social History:  reports that he has been smoking Cigarettes.  He has been smoking about 0.50 packs per day. He has never used smokeless tobacco. He reports that he does not drink alcohol or use illicit drugs.  Allergies: No Known Allergies  Medications Prior to Admission  Medication Sig Dispense Refill  . aspirin EC 81 MG tablet Take 81 mg by mouth daily.      . carvedilol (COREG) 25 MG tablet TAKE ONE TABLET TWICE DAILY  60 tablet  3  . CRESTOR 40 MG tablet TAKE ONE (1) TABLET BY MOUTH EVERY DAY  30 tablet  5  . digoxin (LANOXIN) 0.125 MG tablet Take 125 mcg by mouth daily.        . furosemide (LASIX) 40 MG tablet Take 40 mg by mouth daily.      Marland Kitchen glimepiride (AMARYL) 2 MG tablet Take 1 tablet (2 mg total) by mouth daily before breakfast.  90 tablet  3  . isosorbide mononitrate (IMDUR) 30 MG 24 hr tablet Take 1 tablet (30 mg total) by mouth daily.  30 tablet  5  . lisinopril (PRINIVIL,ZESTRIL) 20 MG tablet TAKE ONE (1) TABLET BY MOUTH EVERY DAY  30 tablet  6  . metFORMIN (GLUCOPHAGE) 1000 MG tablet TAKE ONE TABLET TWICE DAILY  60 tablet  6  . nitroGLYCERIN (NITROSTAT) 0.4 MG SL tablet Place 1 tablet (0.4 mg total) under the tongue every 5 (five) minutes as needed.  25 tablet  6  . potassium chloride (K-DUR) 10 MEQ tablet TAKE ONE (1) TABLET BY MOUTH EVERY DAY  30 tablet  6  . PRADAXA 150 MG CAPS TAKE ONE CAPSULE BY MOUTH TWICE A DAY  60 capsule  3  . spironolactone (ALDACTONE) 25 MG tablet Take 25 mg by mouth daily.        . hydrocodone-acetaminophen (LORCET-HD) 5-500 MG per capsule Take 1 capsule by mouth every 6 (six) hours as needed for pain.         Results for orders placed during the hospital encounter of 12/10/13 (from the past 48 hour(s))  CBC WITH DIFFERENTIAL     Status: Abnormal   Collection Time    12/10/13  1:10 PM      Result Value Range    WBC 12.0 (*) 4.0 - 10.5 K/uL   RBC 5.71  4.22 - 5.81 MIL/uL   Hemoglobin 17.6 (*) 13.0 - 17.0 g/dL   HCT 62.9  52.8 - 41.3 %   MCV 90.0  78.0 - 100.0 fL   MCH 30.8  26.0 - 34.0 pg   MCHC 34.2  30.0 - 36.0 g/dL   RDW 24.4  01.0 - 27.2 %   Platelets 79 (*) 150 - 400 K/uL   Neutrophils Relative % 83 (*) 43 - 77 %   Lymphocytes Relative 11 (*) 12 - 46 %   Monocytes Relative 6  3 - 12 %   Eosinophils Relative 0  0 - 5 %   Basophils  Relative 0  0 - 1 %   Neutro Abs 10.0 (*) 1.7 - 7.7 K/uL   Lymphs Abs 1.3  0.7 - 4.0 K/uL   Monocytes Absolute 0.7  0.1 - 1.0 K/uL   Eosinophils Absolute 0.0  0.0 - 0.7 K/uL   Basophils Absolute 0.0  0.0 - 0.1 K/uL   Smear Review       Value: PLATELET CLUMPS NOTED ON SMEAR, COUNT APPEARS ADEQUATE   Comment: LARGE PLATELETS PRESENT  BASIC METABOLIC PANEL     Status: Abnormal   Collection Time    12/10/13  1:10 PM      Result Value Range   Sodium 126 (*) 135 - 145 mEq/L   Potassium 4.2  3.5 - 5.1 mEq/L   Chloride 89 (*) 96 - 112 mEq/L   CO2 24  19 - 32 mEq/L   Glucose, Bld 577 (*) 70 - 99 mg/dL   Comment: CRITICAL RESULT CALLED TO, READ BACK BY AND VERIFIED WITH:     DANIELS B. AT 1407 ON 122014 BY THOMPSON S.   BUN 11  6 - 23 mg/dL   Creatinine, Ser 4.09  0.50 - 1.35 mg/dL   Calcium 8.9  8.4 - 81.1 mg/dL   GFR calc non Af Amer 84 (*) >90 mL/min   GFR calc Af Amer >90  >90 mL/min   Comment: (NOTE)     The eGFR has been calculated using the CKD EPI equation.     This calculation has not been validated in all clinical situations.     eGFR's persistently <90 mL/min signify possible Chronic Kidney     Disease.  PRO B NATRIURETIC PEPTIDE     Status: Abnormal   Collection Time    12/10/13  1:10 PM      Result Value Range   Pro B Natriuretic peptide (BNP) 9452.0 (*) 0 - 125 pg/mL  TROPONIN I     Status: Abnormal   Collection Time    12/10/13  1:10 PM      Result Value Range   Troponin I 1.93 (*) <0.30 ng/mL   Comment: CRITICAL RESULT CALLED TO, READ  BACK BY AND VERIFIED WITH:     DANIELS B. AT 1407 ON [id number] BY THOMPSON S.                Due to the release kinetics of cTnI,     a negative result within the first hours     of the onset of symptoms does not rule out     myocardial infarction with certainty.     If myocardial infarction is still suspected,     repeat the test at appropriate intervals.  GLUCOSE, CAPILLARY     Status: Abnormal   Collection Time    12/10/13  4:05 PM      Result Value Range   Glucose-Capillary 583 (*) 70 - 99 mg/dL   Comment 1 Notify RN     Dg Chest Portable 1 View  12/10/2013   CLINICAL DATA:  Dyspnea  EXAM: PORTABLE CHEST - 1 VIEW  COMPARISON:  portable chest x-ray dated September 17, 2013.  FINDINGS: The lungs are well-expanded. The interstitial markings are increased diffusely and are more conspicuous than in the past. The cardiopericardial silhouette is enlarged and the pulmonary vascularity is engorged. The patient has undergone previous CABG. There is no significant pleural effusion. There is no pneumothorax. The observed portions of the bony thorax appear normal.  IMPRESSION: The findings are consistent  with congestive heart failure which is likely both acute and chronic. There is interstitial edema but no alveolar edema.   Electronically Signed   By: David  [place]   On: 12/10/2013 13:07    Review of Systems  Constitutional: Negative for fever, chills, weight loss, malaise/fatigue and diaphoresis.  HENT: Negative for congestion, ear pain, hearing loss and tinnitus.   Eyes: Negative for blurred vision, double vision, photophobia and pain.  Respiratory: Negative for cough, hemoptysis, sputum production, shortness of breath, wheezing and stridor.   Cardiovascular: Positive for chest pain and leg swelling. Negative for palpitations, orthopnea, claudication and PND.  Gastrointestinal: Negative for heartburn, nausea, vomiting, abdominal pain, diarrhea and constipation.  Genitourinary: Negative for  dysuria, urgency, frequency, hematuria and flank pain.  Musculoskeletal: Negative for myalgias and neck pain.  Skin: Negative for itching.  Neurological: Negative for dizziness, tingling, tremors, sensory change, speech change, focal weakness, seizures, weakness and headaches.  Endo/Heme/Allergies: Negative for environmental allergies. Does not bruise/bleed easily.  Psychiatric/Behavioral: Negative for depression, suicidal ideas, hallucinations and substance abuse.    Blood pressure 119/84, pulse 40, temperature 97.9 F (36.6 C), temperature source Oral, resp. rate 29, height 5\' 9"  (1.753 m), weight 63.504 kg (140 lb), SpO2 98.00%. Physical Exam  Constitutional: He is oriented to person, place, and time. He appears well-developed and well-nourished. No distress.  HENT:  Head: Normocephalic and atraumatic.  Eyes: EOM are normal. Pupils are equal, round, and reactive to light. Right eye exhibits no discharge. Left eye exhibits no discharge. No scleral icterus.  Neck: Normal range of motion. Neck supple. No JVD present. No tracheal deviation present. No thyromegaly present.  Cardiovascular: Normal heart sounds.   No murmur heard. Irregularly irregular  Respiratory: Effort normal and breath sounds normal. No stridor. No respiratory distress. He has no wheezes. He has no rales. He exhibits no tenderness.  GI: Soft. Bowel sounds are normal. He exhibits no distension. There is no tenderness. There is no rebound and no guarding.  Musculoskeletal: Normal range of motion. He exhibits edema. He exhibits no tenderness.  Neurological: He is alert and oriented to person, place, and time.  Skin: No rash noted. He is not diaphoretic. No erythema.  Psychiatric: He has a normal mood and affect.     Assessment/Plan  1. Chest pain 2. CAD s/p CABG x3 (last cath 2011 with chronically occluded vein grafts to RCA and LCx and no native vessel that are amenable to revascularization) 3. ICM, LVEFt 25% by TTE  from 09/07/2013, functional class 3 4. NIDDM-uncontrolled 5. Thrombocytopenia likely secondary to platelet clumps 6.  Hyponatremia likely secondary to a combination of hyperglycemia and CHF 7.  AFIB with RVR likely secondary to non-compliance with medication 8. Elevated cardiac markers likely secondary to a combination of CAD, CHF and AFIB with RVR 9. Non-compliance with medication  We will admit the patient to cardiology and restart his home medication (which he has not taken for last 1 week).  We will change his Lasix from po to iv and obtain serial cardiac markers to see which way they are trending. We will re-evaluate the patient after he has been re-started on his home medication to see if he has recurrent symptoms.  Given the results of his last cardiac cath, he will likely need to remain on medical therapy with chronic anti-angina medication since there were no native vessels amenable to intervention.  We will start the patient on sliding scale insulin.  Grant Swager E 12/10/2013, 8:00 PM

## 2013-12-10 NOTE — Progress Notes (Signed)
ANTICOAGULATION CONSULT NOTE - Initial Consult  Pharmacy Consult for Heparin Indication: chest pain/ACS  No Known Allergies  Patient Measurements: Height: 5\' 9"  (175.3 cm) Weight: 140 lb (63.504 kg) IBW/kg (Calculated) : 70.7  Vital Signs: BP: 129/86 mmHg (12/20 1500) Pulse Rate: 63 (12/20 1515)  Labs:  Recent Labs  12/10/13 1310  HGB 17.6*  HCT 51.4  PLT 79*  CREATININE 0.96  TROPONINI 1.93*    Estimated Creatinine Clearance: 68 ml/min (by C-G formula based on Cr of 0.96).   Medical History: Past Medical History  Diagnosis Date  . Coronary atherosclerosis of native coronary artery     Ant. MI in 4/95; PTCA of 90% prox & distal LAD unsuccessful-->  Urgent CABG-4/95 TO Cx; 50% RCA; ant. HK; EF of 50-55%; 08/2008: 3-V disease plus a  95% LIMA stenosis, treated with DES; occlusion of SVG to CX; RCA SVG stenosis--> PCI with DES  2010: in-stent restenosis in the LIMA-->cutting balloon; EF of 35-40%;  07/2010: TO of SVG to     RCA-medical therapy advised; EF of 30-35% by echo in 12/2010  . Atrial fibrillation 1995    1995, 2009; bradycardia with beta blocker therapy  . Tobacco abuse     40-50 pack years; currently one pack per day  . Peripheral vascular disease 1995    Abdominal aortic obstruction 1995-->vascular surgery  . Hyperlipidemia   . Type 2 diabetes mellitus   . DDD (degenerative disc disease)     Discectomy and fusion at L4 and L5  . Alcohol use 1999    Excessive alcohol use- quit in 1999  . Chronic systolic heart failure     LVEF 25-30% 2011    Medications:  Scheduled:   Assessment: 67 yo M admitted with chest pain & hyperglycemia.  Troponin elevated. No EKG changes noted.  No bleeding noted, but platelets are <100K on admission.  Spoke with ED nurse caring for patient Gearldine Bienenstock)- will verify MD wants to initiate heparin with low baseline plts.   Goal of Therapy:  Heparin level 0.3-0.7 units/ml Monitor platelets by anticoagulation protocol: Yes   Plan:   If ok with MD-  Give 3000 units bolus x 1 Start heparin infusion at 750 units/hr Check anti-Xa level in 6 hours and daily while on heparin Continue to monitor H&H and platelets  Uliana Brinker, [facility] 12/10/2013,3:50 PM

## 2013-12-10 NOTE — ED Notes (Signed)
CRITICAL VALUE ALERT  Critical value received:  Trop 1.93 and glucose 577  Date of notification:  12/10/13  Time of notification:  1407  Critical value read back:yes  Nurse who received alert:  B. Garner Nash  MD notified (1st page):  Adriana Simas  Time of first page:  1411  MD notified (2nd page):  Time of second page:  Responding MD:  cook  Time MD responded:  [phone]

## 2013-12-11 DIAGNOSIS — R079 Chest pain, unspecified: Secondary | ICD-10-CM

## 2013-12-11 DIAGNOSIS — I200 Unstable angina: Secondary | ICD-10-CM

## 2013-12-11 DIAGNOSIS — I5023 Acute on chronic systolic (congestive) heart failure: Secondary | ICD-10-CM

## 2013-12-11 DIAGNOSIS — Z7901 Long term (current) use of anticoagulants: Secondary | ICD-10-CM

## 2013-12-11 DIAGNOSIS — I4891 Unspecified atrial fibrillation: Secondary | ICD-10-CM

## 2013-12-11 LAB — CBC
HCT: 44.4 % (ref 39.0–52.0)
Hemoglobin: 15.9 g/dL (ref 13.0–17.0)
MCH: 30.9 pg (ref 26.0–34.0)
MCH: 31.5 pg (ref 26.0–34.0)
MCHC: 34.6 g/dL (ref 30.0–36.0)
MCHC: 35.1 g/dL (ref 30.0–36.0)
MCV: 89.1 fL (ref 78.0–100.0)
Platelets: 129 10*3/uL — ABNORMAL LOW (ref 150–400)
RBC: 5.15 MIL/uL (ref 4.22–5.81)
RBC: 4.96 MIL/uL (ref 4.22–5.81)
RDW: 14.4 % (ref 11.5–15.5)

## 2013-12-11 LAB — TROPONIN I
Troponin I: 2.07 ng/mL (ref <0.30)
Troponin I: 1.61 ng/mL (ref <0.30)

## 2013-12-11 LAB — TSH: TSH: 2.88 u[IU]/mL (ref 0.350–4.500)

## 2013-12-11 LAB — GLUCOSE, CAPILLARY
Glucose-Capillary: 232 mg/dL — ABNORMAL HIGH (ref 70–99)
Glucose-Capillary: 297 mg/dL — ABNORMAL HIGH (ref 70–99)
Glucose-Capillary: 466 mg/dL — ABNORMAL HIGH (ref 70–99)

## 2013-12-11 LAB — BASIC METABOLIC PANEL
CO2: 26 mEq/L (ref 19–32)
Calcium: 7.9 mg/dL — ABNORMAL LOW (ref 8.4–10.5)
Chloride: 93 mEq/L — ABNORMAL LOW (ref 96–112)
Creatinine, Ser: 0.91 mg/dL (ref 0.50–1.35)
Glucose, Bld: 353 mg/dL — ABNORMAL HIGH (ref 70–99)

## 2013-12-11 LAB — LIPID PANEL
LDL Cholesterol: 128 mg/dL — ABNORMAL HIGH (ref 0–99)
Total CHOL/HDL Ratio: 5.5 RATIO
Triglycerides: 142 mg/dL (ref <150)
VLDL: 28 mg/dL (ref 0–40)

## 2013-12-11 MED ORDER — INSULIN ASPART 100 UNIT/ML ~~LOC~~ SOLN
10.0000 [IU] | Freq: Once | SUBCUTANEOUS | Status: AC
  Administered 2013-12-11: 10 [IU] via SUBCUTANEOUS

## 2013-12-11 NOTE — Progress Notes (Signed)
Blood pressure low taken in both right and left arms, pt states that he feels fine at this time. Dr Donnie Aho notified orders given.

## 2013-12-11 NOTE — Progress Notes (Signed)
Patient Name: [name] Date of Encounter: 12/11/2013  Active Problems:   Non-ST elevated myocardial infarction (non-STEMI)   CHF (congestive heart failure)   Length of Stay: 1  SUBJECTIVE  The patient is laying in bed comfortably, he states that his pain is better, he denies SOB.   CURRENT MEDS . aspirin EC  81 mg Oral Daily  . atorvastatin  40 mg Oral q1800  . carvedilol  25 mg Oral BID WC  . dabigatran  150 mg Oral Q12H  . digoxin  125 mcg Oral Daily  . furosemide  40 mg Intravenous Q12H  . glimepiride  2 mg Oral QAC breakfast  . insulin aspart  0-15 Units Subcutaneous TID WC  . isosorbide mononitrate  30 mg Oral Daily  . lisinopril  20 mg Oral Daily  . metFORMIN  1,000 mg Oral BID WC  . potassium chloride  10 mEq Oral Daily  . spironolactone  25 mg Oral Daily    OBJECTIVE  Filed Vitals:   12/11/13 0418 12/11/13 0800 12/11/13 0826 12/11/13 1231  BP:  127/85  117/48  Pulse:   86 76  Temp: 97.7 F (36.5 C)  97.2 F (36.2 C) 97.8 F (36.6 C)  TempSrc: Oral  Oral Oral  Resp:   27 30  Height:      Weight:      SpO2:   100% 100%    Intake/Output Summary (Last 24 hours) at 12/11/13 1246 Last data filed at 12/11/13 0900  Gross per 24 hour  Intake    360 ml  Output   1430 ml  Net  -1070 ml   Filed Weights   12/10/13 1245  Weight: 140 lb (63.504 kg)    PHYSICAL EXAM  General: Pleasant, NAD. Neuro: Alert and oriented X 3. Moves all extremities spontaneously. Psych: Normal affect. HEENT:  Normal  Neck: Supple without bruits, JVD + 8 cm Lungs:  Resp regular and unlabored, minimal crackles Heart: RRR no s3, s4, or murmurs. Abdomen: Soft, non-tender, non-distended, BS + x 4.  Extremities: No clubbing, cyanosis or edema. DP/PT/Radials 2+ and equal bilaterally.  Accessory Clinical Findings  CBC  Recent Labs  12/10/13 1310 12/10/13 2135 12/11/13 0240  WBC 12.0* 12.9* 10.5  NEUTROABS 10.0* 9.7*  --   HGB 17.6* 16.8 15.9  HCT 51.4 48.0 45.9   MCV 90.0 89.6 89.1  PLT 79* 117* 92*   Basic Metabolic Panel  Recent Labs  12/10/13 1310 12/11/13 0240  NA 126* 131*  K 4.2 3.5  CL 89* 93*  CO2 24 26  GLUCOSE 577* 353*  BUN 11 12  CREATININE 0.96 0.91  CALCIUM 8.9 7.9*    Recent Labs  12/10/13 2135 12/11/13 0240 12/11/13 0807  TROPONINI 2.25* 2.07* 1.61*    Recent Labs  12/11/13 0240  CHOL 191  HDL 35*  LDLCALC 128*  TRIG 142  CHOLHDL 5.5   Thyroid Function Tests No results found for this basename: TSH, T4TOTAL, FREET3, T3FREE, THYROIDAB,  in the last 72 hours  Radiology/Studies  Dg Chest Portable 1 View  12/10/2013  IMPRESSION: The findings are consistent with congestive heart failure which is likely both acute and chronic. There is interstitial edema but no alveolar edema.     TELE: a-fib rate controlled, 70-80 BPM    ASSESSMENT AND PLAN  67 y/o male with PMH of AFIB, uncontrolled DM, CHF, LVEF 25% by TTE from 09/07/2013, CAD s/p CABG x 3 with LIMA-->LAD, SVG-->RCA and SVG-->LCX. His  last cardiac cath was reportedly in 2011, and it showed native 3-vessel CAD with patent LIMA-->LAD but chronically occluded SVG-->RCA and SVG-->LCX. He was last admitted for chest pain 08/2013. He underwent a nuclear stress test 09/19/2013 which showed large fixed anteroseptal scar and moderate size defect in inferolateral wall, LVEF 29%. He was seen by Dr. Wayne Sever who determined that the area of ischemia where in the RCA and LCX area (where he had chronically occluded grafts); in addition, there were no native vessels amenable to intervention. Thus, medical therapy was recommended.  He stopped taking his medication about 1 week ago because "I didn't want to take it anymore". Since then, he has been experiencing recurrent chest pain and bilateral lower extremity swelling. Last noted weight 09/18/2013 was 80.5 kg. He presented for further evaluation due to chest pain which he describes as a pressure pain 3/10 in severity, but  he denies nausea/vomiting, shortness of breath, dizziness or syncope. He has chronic dyspnea on exertion after walking a few bloods. He is currently asymptomatic. EKG obtained 12/10/2013 at 19:22 showed AFIB with heart rate 104 bpm; there is no ST-elevation on this electrocardiogram. Troponin I is 1.93. Patient is hemodynamically stable.  1. NSTEMI in the settings of a-fib with RVR, troponin now downtrending 2.25--> 1.61, CAD s/p CABG x3 (last cath 2011 with chronically occluded vein grafts to RCA and LCx and no native vessel that are amenable to revascularization -see above),  The patient is non-compliant to his meds, we will continue medical therapy, no plan for a cath - continue ASA, atorvastatin, carvedilol   2. ICM, LVEF 25% by TTE from 09/07/2013, functional class 3, he appears better, almost euvolemic, continue lasix 40 iv bid till tomorrow, then switch to po  3. AFIB with RVR likely secondary to non-compliance with medication, now rate controlled, on xarelto  4. NIDDM-uncontrolled  5. Hyponatremia likely secondary to a combination of hyperglycemia and CHF, improving  6. Non-compliance with medication   7. Etoh, tobacco abuse - counseled   Signed, Tobias Alexander, H MD, [facility] 12/11/2013

## 2013-12-11 NOTE — Progress Notes (Signed)
CRITICAL VALUE ALERT  Critical value received:  (+) Troponin 2.25  Date of notification:  12/10/13  Time of notification:  2345  Critical value read back:yes  Nurse who received alert:  Toula Moos  MD notified (1st page):  Expected value

## 2013-12-11 NOTE — Progress Notes (Signed)
Utilization Review Completed.Nikolaj Geraghty T12/21/2014  

## 2013-12-12 DIAGNOSIS — I214 Non-ST elevation (NSTEMI) myocardial infarction: Secondary | ICD-10-CM

## 2013-12-12 DIAGNOSIS — I4891 Unspecified atrial fibrillation: Secondary | ICD-10-CM

## 2013-12-12 LAB — GLUCOSE, CAPILLARY
Glucose-Capillary: 340 mg/dL — ABNORMAL HIGH (ref 70–99)
Glucose-Capillary: 199 mg/dL — ABNORMAL HIGH (ref 70–99)
Glucose-Capillary: 187 mg/dL — ABNORMAL HIGH (ref 70–99)
Glucose-Capillary: 165 mg/dL — ABNORMAL HIGH (ref 70–99)

## 2013-12-12 LAB — HEMOGLOBIN A1C: Mean Plasma Glucose: 381 mg/dL — ABNORMAL HIGH (ref <117)

## 2013-12-12 LAB — BASIC METABOLIC PANEL
BUN: 15 mg/dL (ref 6–23)
CO2: 28 mEq/L (ref 19–32)
GFR calc Af Amer: >90 mL/min (ref >90)
GFR calc non Af Amer: 87 mL/min — ABNORMAL LOW (ref >90)
Glucose, Bld: 259 mg/dL — ABNORMAL HIGH (ref 70–99)
Potassium: 3.7 mEq/L (ref 3.5–5.1)
Sodium: 130 mEq/L — ABNORMAL LOW (ref 135–145)

## 2013-12-12 LAB — PRO B NATRIURETIC PEPTIDE: Pro B Natriuretic peptide (BNP): 4015 pg/mL — ABNORMAL HIGH (ref 0–125)

## 2013-12-12 MED ORDER — FUROSEMIDE 10 MG/ML IJ SOLN
60.0000 mg | Freq: Once | INTRAMUSCULAR | Status: AC
  Administered 2013-12-12: 60 mg via INTRAVENOUS

## 2013-12-12 MED ORDER — POTASSIUM CHLORIDE CRYS ER 10 MEQ PO TBCR
10.0000 meq | EXTENDED_RELEASE_TABLET | Freq: Two times a day (BID) | ORAL | Status: DC
  Administered 2013-12-12 – 2013-12-16 (×9): 10 meq via ORAL
  Filled 2013-12-12 (×10): qty 1

## 2013-12-12 MED ORDER — LISINOPRIL 5 MG PO TABS
5.0000 mg | ORAL_TABLET | Freq: Every day | ORAL | Status: DC
  Administered 2013-12-13 – 2013-12-14 (×2): 5 mg via ORAL
  Filled 2013-12-12 (×3): qty 1

## 2013-12-12 MED ORDER — INSULIN ASPART 100 UNIT/ML ~~LOC~~ SOLN
0.0000 [IU] | Freq: Three times a day (TID) | SUBCUTANEOUS | Status: DC
  Administered 2013-12-12 – 2013-12-13 (×2): 4 [IU] via SUBCUTANEOUS
  Administered 2013-12-13: 3 [IU] via SUBCUTANEOUS
  Administered 2013-12-14 – 2013-12-15 (×2): 4 [IU] via SUBCUTANEOUS
  Administered 2013-12-15: 7 [IU] via SUBCUTANEOUS
  Administered 2013-12-16: 3 [IU] via SUBCUTANEOUS
  Administered 2013-12-16: 11 [IU] via SUBCUTANEOUS

## 2013-12-12 MED ORDER — CARVEDILOL 12.5 MG PO TABS
12.5000 mg | ORAL_TABLET | Freq: Two times a day (BID) | ORAL | Status: DC
  Administered 2013-12-13 – 2013-12-16 (×5): 12.5 mg via ORAL
  Filled 2013-12-12 (×10): qty 1

## 2013-12-12 MED ORDER — INSULIN ASPART 100 UNIT/ML ~~LOC~~ SOLN: 0.0000 [IU] | Freq: Every day | SUBCUTANEOUS | Status: DC

## 2013-12-12 MED ORDER — LOPERAMIDE HCL 2 MG PO CAPS
4.0000 mg | ORAL_CAPSULE | Freq: Once | ORAL | Status: AC
  Administered 2013-12-12: 4 mg via ORAL
  Filled 2013-12-12: qty 2

## 2013-12-12 MED ORDER — LOPERAMIDE HCL 2 MG PO CAPS: 2.0000 mg | ORAL_CAPSULE | ORAL | Status: DC | PRN

## 2013-12-12 NOTE — Progress Notes (Signed)
Subjective: No CP  Breathing is OK   Objective: Filed Vitals:   12/11/13 2009 12/11/13 2012 12/11/13 2223 12/12/13 0419  BP: 63/45 84/58 85/49  97/72  Pulse: 72 70 78 71  Temp: 97.7 F (36.5 C) 97.7 F (36.5 C) 97.8 F (36.6 C) 97.7 F (36.5 C)  TempSrc: Oral Oral Oral Oral  Resp: 28 19    Height:      Weight:      SpO2: 96% 97% 95% 93%   Weight change:   Intake/Output Summary (Last 24 hours) at 12/12/13 0746 Last data filed at 12/11/13 2300  Gross per 24 hour  Intake    897 ml  Output    600 ml  Net    297 ml    General: Alert, awake, oriented x3, in no acute distress Neck:  JVP is normal Heart: Regular rate and rhythm, without murmurs, rubs, gallops.  Lungs: Clear to auscultation.  No rales or wheezes. Exemities:  1+ edema.   Neuro: Grossly intact, nonfocal.  Tele:  Afib 70s   Lab Results: Results for orders placed during the hospital encounter of 12/10/13 (from the past 24 hour(s))  TROPONIN I     Status: Abnormal   Collection Time    12/11/13  8:07 AM      Result Value Range   Troponin I 1.61 (*) <0.30 ng/mL  GLUCOSE, CAPILLARY     Status: Abnormal   Collection Time    12/11/13  9:26 AM      Result Value Range   Glucose-Capillary 466 (*) 70 - 99 mg/dL  GLUCOSE, CAPILLARY     Status: Abnormal   Collection Time    12/11/13 12:24 PM      Result Value Range   Glucose-Capillary 340 (*) 70 - 99 mg/dL  GLUCOSE, CAPILLARY     Status: Abnormal   Collection Time    12/11/13  5:03 PM      Result Value Range   Glucose-Capillary 108 (*) 70 - 99 mg/dL  CBC     Status: Abnormal   Collection Time    12/11/13  8:13 PM      Result Value Range   WBC 11.7 (*) 4.0 - 10.5 K/uL   RBC 4.96  4.22 - 5.81 MIL/uL   Hemoglobin 15.6  13.0 - 17.0 g/dL   HCT 16.1  09.6 - 04.5 %   MCV 89.5  78.0 - 100.0 fL   MCH 31.5  26.0 - 34.0 pg   MCHC 35.1  30.0 - 36.0 g/dL   RDW 40.9  81.1 - 91.4 %   Platelets 129 (*) 150 - 400 K/uL  GLUCOSE, CAPILLARY     Status: Abnormal   Collection  Time    12/11/13 10:27 PM      Result Value Range   Glucose-Capillary 133 (*) 70 - 99 mg/dL   Comment 1 Documented in Chart     Comment 2 Notify RN      Studies/Results: @RISRSLT24 @  Medications: Reviewed   @PROBHOSP @ 67 y/o male with PMH of AFIB, uncontrolled DM, CHF, LVEF 25% by TTE from 09/07/2013, CAD s/p CABG x 3 with LIMA-->LAD, SVG-->RCA and SVG-->LCX. His last cardiac cath was reportedly in 2011, and it showed native 3-vessel CAD with patent LIMA-->LAD but chronically occluded SVG-->RCA and SVG-->LCX. He was last admitted for chest pain 08/2013. He underwent a nuclear stress test 09/19/2013 which showed large fixed anteroseptal scar and moderate size defect in inferolateral wall, LVEF 29%. He was seen by  Dr. Wayne Sever who determined that the area of ischemia where in the RCA and LCX area (where he had chronically occluded grafts); in addition, there were no native vessels amenable to intervention. Thus, medical therapy was recommended.  He stopped taking his medication about 1 week ago because "I didn't want to take it anymore".  1.  NSTEMI in setting of afib with RVR  Patient now CP free  Had not been taking meds  Resumed  2  ICM LVEF 25%  Volume is up a little though patient is comfortable  Wil resume lasix  Give IV today.  Check BNP  3.  Afib  Rates controlled  On Xarelto  4.  NIDDM  5.  Noncompliance  Pateint says he will take meds    6. , Tob abuse  Counselled  LOS: 2 days   Dietrich Pates 12/12/2013, 7:46 AM

## 2013-12-12 NOTE — Progress Notes (Signed)
Inpatient Diabetes Program Recommendations  AACE/ADA: New Consensus Statement on Inpatient Glycemic Control (2013)  Target Ranges:  Prepandial:   less than 140 mg/dL      Peak postprandial:   less than 180 mg/dL (1-2 hours)      Critically ill patients:  140 - 180 mg/dL   Inpatient Diabetes Program Recommendations Insulin - Basal: add basal Lantus 10 units HS HgbA1C: =14.9    Note: MD, will this patient be discharged on insulin? If so, please address so that insulin self-administration education can begin. Thank you  Piedad Climes BSN, RN,CDE Inpatient Diabetes Coordinator [phone] (team pager)

## 2013-12-13 LAB — GLUCOSE, CAPILLARY
Glucose-Capillary: 166 mg/dL — ABNORMAL HIGH (ref 70–99)
Glucose-Capillary: 101 mg/dL — ABNORMAL HIGH (ref 70–99)
Glucose-Capillary: 167 mg/dL — ABNORMAL HIGH (ref 70–99)

## 2013-12-13 MED ORDER — FUROSEMIDE 40 MG PO TABS
40.0000 mg | ORAL_TABLET | Freq: Every day | ORAL | Status: DC
  Administered 2013-12-13 – 2013-12-16 (×4): 40 mg via ORAL
  Filled 2013-12-13 (×4): qty 1

## 2013-12-13 NOTE — Progress Notes (Signed)
CARDIAC REHAB PHASE I   PRE:  Rate/Rhythm: 78 afib  BP:  Supine: 85/38  Sitting: 94/50  Standing:    SaO2:   MODE:  Ambulation: 100 ft   POST:  Rate/Rhythm: 82 afib  BP:  Supine:   Sitting: 143/56  Standing:    SaO2:  1315-1340 Pt walked 100 ft on RA with rolling walker. Stated he needs cane to walk at home. Tired by end of walk but denied CP or dizziness. To bed as he did not want to go to recliner. Told pt we would be back for education. He stated he had heart problems since 1995 and had had ed. ? If pt will be receptive but will try to educate tomorrow.   Luetta Nutting, RN BSN  12/13/2013 1:37 PM

## 2013-12-13 NOTE — Care Management Note (Addendum)
    Page 1 of 1   12/16/2013     2:24:23 PM   CARE MANAGEMENT NOTE 12/16/2013  Patient:  [name], [name]   Account Number:  [id number]  Date Initiated:  12/12/2013  Documentation initiated by:  Junius Creamer  Subjective/Objective Assessment:   adm w mi     Action/Plan:   lives w wife, pcp dr Thalia Party mcginnis   Choice offered to / List presented to:  C-1 Patient   HH arranged  HH-1 RN  HH-10 DISEASE MANAGEMENT      HH agency  [facility]   Per UR Regulation:  Reviewed for med. necessity/level of care/duration of stay  Comments:  12/16/13 1329 Ishaan Villamar RN MSN BSN CCM Received referral to arrange home health nurse for education/support, to encourage compliance.  Pt had no agency preference; referral called to [facility].  12/23 1003 debbie dowell rn,bns will give pt 30day free pradaxa card. she will have 74.38/mo copay but if mails in can get 90days for 80.00.

## 2013-12-13 NOTE — Progress Notes (Signed)
Patient Name: [name] Date of Encounter: 12/13/2013     Active Problems:   Non-ST elevated myocardial infarction (non-STEMI)   CHF (congestive heart failure)    SUBJECTIVE  The patient feels better today.  He understands the importance of not stopping his medication at home.  He denies any chest pain or shortness of breath.  CURRENT MEDS . aspirin EC  81 mg Oral Daily  . atorvastatin  40 mg Oral q1800  . carvedilol  12.5 mg Oral BID WC  . dabigatran  150 mg Oral Q12H  . digoxin  125 mcg Oral Daily  . glimepiride  2 mg Oral QAC breakfast  . insulin aspart  0-20 Units Subcutaneous TID WC  . insulin aspart  0-5 Units Subcutaneous QHS  . isosorbide mononitrate  30 mg Oral Daily  . lisinopril  5 mg Oral Daily  . metFORMIN  1,000 mg Oral BID WC  . potassium chloride  10 mEq Oral BID  . spironolactone  25 mg Oral Daily    OBJECTIVE  Filed Vitals:   12/13/13 0046 12/13/13 0445 12/13/13 0759 12/13/13 0851  BP: 100/68 118/79 125/79 143/101  Pulse:   73 97  Temp:  97.7 F (36.5 C) 98.3 F (36.8 C)   TempSrc:  Oral Oral   Resp: 25 27 26    Height:      Weight:      SpO2:   92%     Intake/Output Summary (Last 24 hours) at 12/13/13 1039 Last data filed at 12/13/13 0230  Gross per 24 hour  Intake    480 ml  Output    860 ml  Net   -380 ml   Filed Weights   12/10/13 1245  Weight: 140 lb (63.504 kg)    PHYSICAL EXAM  General: Pleasant, NAD. Neuro: Alert and oriented X 3. Moves all extremities spontaneously. Psych: Normal affect. HEENT:  Normal  Neck: Supple without bruits or JVD. Lungs:  Resp regular and unlabored, CTA. Heart: Pulse is irregularly irregular with controlled ventricular response Abdomen: Soft, non-tender, non-distended, BS + x 4.  Extremities: There is 2+ soft pitting edema bilaterally  Accessory Clinical Findings  CBC  Recent Labs  12/10/13 1310 12/10/13 2135 12/11/13 0240 12/11/13 2013  WBC 12.0* 12.9* 10.5 11.7*  NEUTROABS  10.0* 9.7*  --   --   HGB 17.6* 16.8 15.9 15.6  HCT 51.4 48.0 45.9 44.4  MCV 90.0 89.6 89.1 89.5  PLT 79* 117* 92* 129*   Basic Metabolic Panel  Recent Labs  12/11/13 0240 12/12/13 0906  NA 131* 130*  K 3.5 3.7  CL 93* 92*  CO2 26 28  GLUCOSE 353* 259*  BUN 12 15  CREATININE 0.91 0.90  CALCIUM 7.9* 8.0*   Liver Function Tests No results found for this basename: AST, ALT, ALKPHOS, BILITOT, PROT, ALBUMIN,  in the last 72 hours No results found for this basename: LIPASE, AMYLASE,  in the last 72 hours Cardiac Enzymes  Recent Labs  12/10/13 2135 12/11/13 0240 12/11/13 0807  TROPONINI 2.25* 2.07* 1.61*   BNP No components found with this basename: POCBNP,  D-Dimer No results found for this basename: DDIMER,  in the last 72 hours Hemoglobin A1C  Recent Labs  12/10/13 2135  HGBA1C 14.9*   Fasting Lipid Panel  Recent Labs  12/11/13 0240  CHOL 191  HDL 35*  LDLCALC 128*  TRIG 142  CHOLHDL 5.5   Thyroid Function Tests  Recent Labs  12/10/13 2135  TSH 2.880    TELE  Atrial fibrillation with controlled ventricular response  ECG  Atrial fibrillation. Atrial fibrillation Low voltage QRS Septal infarct , age undetermined Lateral infarct , age undetermined Inferior infarct , age undetermined Abnormal ECG No significant change since last tracing Radiology/Studies  Dg Chest Portable 1 View  12/10/2013   CLINICAL DATA:  Dyspnea  EXAM: PORTABLE CHEST - 1 VIEW  COMPARISON:  portable chest x-ray dated September 17, 2013.  FINDINGS: The lungs are well-expanded. The interstitial markings are increased diffusely and are more conspicuous than in the past. The cardiopericardial silhouette is enlarged and the pulmonary vascularity is engorged. The patient has undergone previous CABG. There is no significant pleural effusion. There is no pneumothorax. The observed portions of the bony thorax appear normal.  IMPRESSION: The findings are consistent with congestive  heart failure which is likely both acute and chronic. There is interstitial edema but no alveolar edema.   Electronically Signed   By: David  [place]   On: 12/10/2013 13:07    ASSESSMENT AND PLAN 1. NSTEMI in setting of afib with RVR .  No further chest discomfort. 2 ICM LVEF 25% .  There is still mild fluid overload with significant lower extremity edema 3. Afib Rates controlled.  Continue Pradaxa for anticoagulation  4. NIDDM.  Very poor outpatient compliance with hemoglobin A1c of 14.9  5. Noncompliance  6. , Tob abuse Counselled  Plan: We will ambulate him with the help of cardiac rehabilitation today.  We will switch to oral furosemide today.  Anticipate discharge home tomorrow with followup with Dr. [name] in [place]. Signed, Cassell Clement  MD

## 2013-12-13 NOTE — Progress Notes (Signed)
Inpatient Diabetes Program Recommendations  AACE/ADA: New Consensus Statement on Inpatient Glycemic Control (2013)  Target Ranges:  Prepandial:   less than 140 mg/dL      Peak postprandial:   less than 180 mg/dL (1-2 hours)      Critically ill patients:  140 - 180 mg/dL  Results for UKIAH, TRAWICK (MRN [id number]) as of 12/13/2013 15:07  Ref. Range 12/12/2013 12:29 12/12/2013 16:49 12/12/2013 21:16 12/13/2013 08:04 12/13/2013 12:04  Glucose-Capillary Latest Range: 70-99 mg/dL 782 (H) 956 (H) 213 (H) 125 (H) 166 (H)    Inpatient Diabetes Program Recommendations Insulin - Basal: . HgbA1C: =14.9    Diabetes Coordinator spoke with patient briefly concerning elevated A1C.  Patient did not want to discuss it.  He stated "I will eat whatever I want to eat".  No further questions/concerns at this time. Thank you  Piedad Climes BSN, RN,CDE Inpatient Diabetes Coordinator [phone] (team pager)

## 2013-12-13 NOTE — Progress Notes (Signed)
Patient experiencing an 8 beat run of V. Tach then returning to A. Fib with no intervention. Patient remains asymptomatic with blood pressure 100/68 (77). Will continue to monitor. Jacqulynn Cadet

## 2013-12-14 ENCOUNTER — Encounter (HOSPITAL_COMMUNITY): Payer: Self-pay | Admitting: Physician Assistant

## 2013-12-14 ENCOUNTER — Encounter (HOSPITAL_COMMUNITY)
Admission: EM | Disposition: A | Discharge status: Home / Self Care | Payer: Self-pay | Source: Home / Self Care | Attending: Cardiology

## 2013-12-14 DIAGNOSIS — I2510 Atherosclerotic heart disease of native coronary artery without angina pectoris: Secondary | ICD-10-CM

## 2013-12-14 DIAGNOSIS — Z9119 Patient's noncompliance with other medical treatment and regimen: Secondary | ICD-10-CM

## 2013-12-14 HISTORY — PX: LEFT HEART CATHETERIZATION WITH CORONARY ANGIOGRAM: SHX5451

## 2013-12-14 LAB — GLUCOSE, CAPILLARY
Glucose-Capillary: 153 mg/dL — ABNORMAL HIGH (ref 70–99)
Glucose-Capillary: 118 mg/dL — ABNORMAL HIGH (ref 70–99)

## 2013-12-14 SURGERY — LEFT HEART CATHETERIZATION WITH CORONARY ANGIOGRAM
Anesthesia: LOCAL

## 2013-12-14 MED ORDER — FUROSEMIDE 10 MG/ML IJ SOLN
20.0000 mg | Freq: Once | INTRAMUSCULAR | Status: AC
  Administered 2013-12-14: 20 mg via INTRAVENOUS

## 2013-12-14 MED ORDER — SODIUM CHLORIDE 0.9 % IJ SOLN: 3.0000 mL | INTRAMUSCULAR | Status: DC | PRN

## 2013-12-14 MED ORDER — FUROSEMIDE 10 MG/ML IJ SOLN
INTRAMUSCULAR | Status: AC
  Filled 2013-12-14: qty 4

## 2013-12-14 MED ORDER — SODIUM CHLORIDE 0.9 % IV SOLN: 250.0000 mL | INTRAVENOUS | Status: DC | PRN

## 2013-12-14 MED ORDER — ONDANSETRON HCL 4 MG/2ML IJ SOLN: 4.0000 mg | Freq: Four times a day (QID) | INTRAMUSCULAR | Status: DC | PRN

## 2013-12-14 MED ORDER — SODIUM CHLORIDE 0.9 % IJ SOLN
3.0000 mL | Freq: Two times a day (BID) | INTRAMUSCULAR | Status: DC
  Administered 2013-12-14: 3 mL via INTRAVENOUS

## 2013-12-14 MED ORDER — SODIUM CHLORIDE 0.9 % IV SOLN
INTRAVENOUS | Status: AC
  Administered 2013-12-14: 75 mL/h via INTRAVENOUS

## 2013-12-14 MED ORDER — MIDAZOLAM HCL 2 MG/2ML IJ SOLN
INTRAMUSCULAR | Status: AC
  Filled 2013-12-14: qty 2

## 2013-12-14 MED ORDER — HEPARIN SODIUM (PORCINE) 1000 UNIT/ML IJ SOLN
INTRAMUSCULAR | Status: AC
  Filled 2013-12-14: qty 1

## 2013-12-14 MED ORDER — LIDOCAINE HCL (PF) 1 % IJ SOLN
INTRAMUSCULAR | Status: AC
  Filled 2013-12-14: qty 30

## 2013-12-14 MED ORDER — VERAPAMIL HCL 2.5 MG/ML IV SOLN
INTRAVENOUS | Status: AC
  Filled 2013-12-14: qty 2

## 2013-12-14 MED ORDER — ACETAMINOPHEN 325 MG PO TABS: 650.0000 mg | ORAL_TABLET | ORAL | Status: DC | PRN

## 2013-12-14 MED ORDER — HEPARIN (PORCINE) IN NACL 2-0.9 UNIT/ML-% IJ SOLN
INTRAMUSCULAR | Status: AC
  Filled 2013-12-14: qty 1000

## 2013-12-14 MED ORDER — FENTANYL CITRATE 0.05 MG/ML IJ SOLN
INTRAMUSCULAR | Status: AC
  Filled 2013-12-14: qty 2

## 2013-12-14 NOTE — H&P (View-Only) (Signed)
  Patient developed 10/10 CP similar to previous NSTEMI while being discharged. ECG non-acute.   I reviewed his previous cath and also discussed it with Dr. Cooper. I suspect we are unlikely to find any significant changes on his cath but given post-MI angina, I think we need to re-study him today.  He did receive dose of Pradaxa this am for AF so will plan L radial approach.   Given ongoing tobacco us, poorly controlled DM2 and medical noncompliance not good candidate for re-do CABG. If cath stable, consider d/c later today.   Daniel Bensimhon,MD 10:33 AM   

## 2013-12-14 NOTE — CV Procedure (Addendum)
Cardiac Cath Procedure Note:  Indication: NSTEMI  Procedures performed:  1) Selective coronary angiography 2) Left heart catheterization 3) Left ventriculogram  Description of procedure:   The risks and indication of the procedure were explained. Consent was signed and placed on the chart. An appropriate timeout was taken prior to the procedure. After a normal Allen's test was confirmed, the left wrist was prepped and draped in the routine sterile fashion and anesthetized with 1% local lidocaine.   A 5 FR arterial sheath was then placed in the right radial artery using a modified Seldinger technique. !,000 units systemic heparin was administered (patient had Pradaxa this am). 3mg  IV verapamil was given through the sheath. Standard catheters including an IM, JL 3.5, JR4 and straight pigtail were used. All catheter exchanges were made over a wire.  Complications:  None apparent  Findings:  Ao Pressure: 106/69 (83) LV Pressure:  111/21/25 There was no signficant gradient across the aortic valve on pullback.  Left main: 50-60% ostial  Left anterior descending artery:  The left anterior descending artery was completely occluded after a small diagonal branch. There was a 90% lesion in the ostium of the diagonal branch  Circumflex artery:  There was 70% proximal lesion. The circumflex artery was then completely occluded after a moderate sized marginal branch. There were L to right collaterals to the distal RCA  Right coronary artery:  The right coronary artery was completely occluded at its midportion.  There were collaterals to the distal circumflex artery froma right ventricular branch.  The LIMA graft to the LAD was patent.  There was 30% narrowing at the ostium of the LAD at the proximal edge of the stent.  The LAD itself was diffusely diseased with 70-80% diffuse disease distally.  The LAD filled the posterior descending branch by collaterals.  The saphenous vein graft to the right  coronary artery was completely occluded at its origin at the stent site.  The saphenous vein graft to the circumflex system was completely occluded by prior study.  Left ventriculogram:  The left ventriculogram performed in the RAO projection showed a dilated LV with EF 20-25%  hypokinesis of the basilar to mid anterior wall and akinesis. Akinesis of the mid to distal anterior wall and entire apex.  The basal to mid inferior wall was severely hypokinetic  Assessment: 1. Severe 3-VAD CAD 2. SVG x 2 (RCA and OM) chronically occluded 3. Patent LIMA to LAD 4. Severe LV dysfunction EF 20-25% with elevated LVEDP  Plan/Discussion:  Coronary anatomy is stable since prior cath in 2011. No clear culprit for NSTEMI or target for revascularization. Elevated filling pressures. Can go home later today or tomorrow if pain under control.   Arvilla Meres MD  1:41 PM

## 2013-12-14 NOTE — Interval H&P Note (Signed)
History and Physical Interval Note:  12/14/2013 12:46 PM  [name]  has presented today for surgery, with the diagnosis of Chest pain  The various methods of treatment have been discussed with the patient and family. After consideration of risks, benefits and other options for treatment, the patient has consented to  Procedure(s): LEFT HEART CATHETERIZATION WITH CORONARY ANGIOGRAM (N/A) and possible angioplastyCath Lab Visit (complete for each Cath Lab visit)  Clinical Evaluation Leading to the Procedure:   ACS: yes  Non-ACS:    Anginal Classification: CCS IV  Anti-ischemic medical therapy: Maximal Therapy (2 or more classes of medications)  Non-Invasive Test Results: No non-invasive testing performed  Prior CABG: Previous CABG       as a surgical intervention .  The patient's history has been reviewed, patient examined, no change in status, stable for surgery.  I have reviewed the patient's chart and labs.  Questions were answered to the patient's satisfaction.     Daniel Bensimhon

## 2013-12-14 NOTE — Interval H&P Note (Signed)
History and Physical Interval Note:  12/14/2013 12:45 PM  [name]  has presented today for surgery, with the diagnosis of NSTEMI The various methods of treatment have been discussed with the patient and family. After consideration of risks, benefits and other options for treatment, the patient has consented to  Procedure(s): LEFT HEART CATHETERIZATION WITH CORONARY ANGIOGRAM (N/A) and possible angioplasty as a surgical intervention .  The patient's history has been reviewed, patient examined, no change in status, stable for surgery.  I have reviewed the patient's chart and labs.  Questions were answered to the patient's satisfaction.     Kaliya Shreiner

## 2013-12-14 NOTE — Progress Notes (Signed)
Patient Name: [name] Date of Encounter: 12/14/2013     Active Problems:   Non-ST elevated myocardial infarction (non-STEMI)   CHF (congestive heart failure)    SUBJECTIVE Patient feels well this am. Anxious to go home. States he will take his medicine as prescribed to avoid future admissions. Walked in hall yesterday.  CURRENT MEDS . aspirin EC  81 mg Oral Daily  . atorvastatin  40 mg Oral q1800  . carvedilol  12.5 mg Oral BID WC  . dabigatran  150 mg Oral Q12H  . digoxin  125 mcg Oral Daily  . furosemide  40 mg Oral Daily  . glimepiride  2 mg Oral QAC breakfast  . insulin aspart  0-20 Units Subcutaneous TID WC  . insulin aspart  0-5 Units Subcutaneous QHS  . isosorbide mononitrate  30 mg Oral Daily  . lisinopril  5 mg Oral Daily  . metFORMIN  1,000 mg Oral BID WC  . potassium chloride  10 mEq Oral BID  . spironolactone  25 mg Oral Daily    OBJECTIVE  Filed Vitals:   12/13/13 2300 12/14/13 0000 12/14/13 0300 12/14/13 0413  BP:  137/87  109/67  Pulse:      Temp: 97.7 F (36.5 C)  97.3 F (36.3 C)   TempSrc: Oral  Oral   Resp:      Height:      Weight:      SpO2:  94%  95%    Intake/Output Summary (Last 24 hours) at 12/14/13 0655 Last data filed at 12/14/13 0200  Gross per 24 hour  Intake    320 ml  Output    830 ml  Net   -510 ml   Filed Weights   12/10/13 1245 12/13/13 1700  Weight: 182 lb 15.7 oz (83 kg) 183 lb 13.8 oz (83.4 kg)    PHYSICAL EXAM  General: Pleasant, NAD.  Neuro: Alert and oriented X 3. Moves all extremities spontaneously.  Psych: Normal affect.  HEENT: Normal  Neck: Supple without bruits or JVD.  Lungs: Resp regular and unlabored, CTA.  Heart: Pulse is irregularly irregular with controlled ventricular response  Abdomen: Soft, non-tender, non-distended, BS + x 4.  Extremities: There is 1+ soft pitting edema bilaterally   Accessory Clinical Findings  CBC  Recent Labs  12/11/13 2013  WBC 11.7*  HGB 15.6  HCT  44.4  MCV 89.5  PLT 129*   Basic Metabolic Panel  Recent Labs  12/12/13 0906  NA 130*  K 3.7  CL 92*  CO2 28  GLUCOSE 259*  BUN 15  CREATININE 0.90  CALCIUM 8.0*   Liver Function Tests No results found for this basename: AST, ALT, ALKPHOS, BILITOT, PROT, ALBUMIN,  in the last 72 hours No results found for this basename: LIPASE, AMYLASE,  in the last 72 hours Cardiac Enzymes  Recent Labs  12/11/13 0807  TROPONINI 1.61*   BNP No components found with this basename: POCBNP,  D-Dimer No results found for this basename: DDIMER,  in the last 72 hours Hemoglobin A1C No results found for this basename: HGBA1C,  in the last 72 hours Fasting Lipid Panel No results found for this basename: CHOL, HDL, LDLCALC, TRIG, CHOLHDL, LDLDIRECT,  in the last 72 hours Thyroid Function Tests No results found for this basename: TSH, T4TOTAL, FREET3, T3FREE, THYROIDAB,  in the last 72 hours  TELE  Atrial fibrillation with controlled VR  ECG    Radiology/Studies  Dg Chest Portable 1 View  12/10/2013   CLINICAL DATA:  Dyspnea  EXAM: PORTABLE CHEST - 1 VIEW  COMPARISON:  portable chest x-ray dated September 17, 2013.  FINDINGS: The lungs are well-expanded. The interstitial markings are increased diffusely and are more conspicuous than in the past. The cardiopericardial silhouette is enlarged and the pulmonary vascularity is engorged. The patient has undergone previous CABG. There is no significant pleural effusion. There is no pneumothorax. The observed portions of the bony thorax appear normal.  IMPRESSION: The findings are consistent with congestive heart failure which is likely both acute and chronic. There is interstitial edema but no alveolar edema.   Electronically Signed   By: David  [place]   On: 12/10/2013 13:07    ASSESSMENT AND PLAN 1. NSTEMI in setting of afib with RVR . No further chest discomfort.  2 ICM LVEF 25% .   3. Afib Rates controlled. Continue Pradaxa for  anticoagulation  4. NIDDM. Very poor outpatient compliance with hemoglobin A1c of 14.9 Diabetes counselor has seen him. He is not interested in diabetes education. 5. Noncompliance  6. Tobacco abuse.  Counseled.   Plan: DC today. Transition of care followup with Dr. [name].  Signed, Cassell Clement  MD

## 2013-12-14 NOTE — Discharge Summary (Signed)
Discharge Summary   Patient ID: ANDI MAHAFFY MRN: [id number], DOB/AGE: [birth year] 67 y.o. Admit date: 12/10/2013 D/C date:     12/16/2013  Primary Cardiologist: Dr. [name]  Active Problems:   Atrial fibrillation   Acute on chronic systolic heart failure   Non-ST elevated myocardial infarction (non-STEMI)   Non-compliance   DIABETES MELLITUS, TYPE II   Hyperlipidemia   TOBACCO ABUSE   Hospital Course: ANTAR MILKS is a 67 y.o. male with a history of atrial fibrillation rate controlled and on Pradaxa, continued tobacco abuse, uncontrolled DM, non compliance, CAD s/p CABG x3 in 1995, s/p DES (2009 & 2011), ICM, chronic systolic CHF (EF 40% by ECHO 9/17) who was admitted on 12/10/13 with NSTEMI and heart failure exacerbation in the setting of Afib with RVR. Mr Juba is s/p CABG x3 in 1994. His last cath was in 2011, which revealed chronically occluded vein grafts to RCA and LCx and no native vessels amenable to revascularization. He had a patent LIMA to LAD. He was last admitted in 08/2013 and underwent nuclear stress test that showed a fixed anteroseptal scar and moderate size defect in inferolateral wall. These findings were felt to be consistent with the occluded SVG-->RCA and SVG-->LCX found on cardiac cath in 2011. Thus, medical therapy was recommended. Since then, he had been on Coreg, Imdur, Prinivil, Spironolactone, SL NTG and Lipitor. He states that since discharge from hospital 09/19/2013, he has been experiencing intermittent chest discomfort, some SOB and lower extremity edema. He stopped taking his medication about 1 week before admission because "I didn't want to take it anymore".  Upon admission EKG revealed AFIB with RVR; there was no ST-elevation; however, he did have elevated cardiac enzymes. His peak troponin was 2.25 and trending downward; last troponin was 1.61. His BNP was 9,452. He was also noted to have hyponatremia and thrombocytopenia. Hyponatremia was corrected  with diuresis and heparin was held with low platelets. He diuresed well on 40 mg IV Lasix BID. Patient's Afib is now rate controlled with digoxin and he has no chest pain or SOB. While in the hospital he was noted to have a Hg A1c of 14.9. He has very poor outpatient compliance. A diabetes counselor saw him on this admission, but unfortunatley he is not interested in diabetes education. Patient was ready to be discharged on 12/14/13 when he developed 10/10 chest pain similar to previous NSTEMI. His ECG was non-acute. Dr. Gala Romney reviewed his previous studies and elected to do a repeat cardiac catheterization given his post-MI angina and significant risk factors. Cardiac cath revealed severe 3-VAD CAD with SVG x 2 (RCA and OM) chronically occluded. Patent LIMA to LAD and severe LV dysfunction EF 20-25% with elevated LVEDP. The coronary anatomy was thought to be stable since prior cath in 2011 with no clear culprit for NSTEMI or target for revascularization.  He was seen and evaluated today by Dr. Patty Sermons today who deemed him ready for discharge home. He will resume his home medical regimen. No medication changes were made on this admission. Prescription for a 30 day free supply of Pradaxa was given to patient on 12/24. The importance of compliance with his medications as well as tobacco cessation were addressed on many occasions. The patient verbalized understanding. Follow up appointments have been made. He will need close follow to try to ensure compliance with medication and diet.  Discharge Vitals: Blood pressure 126/67, pulse 74, temperature 98.1 F (36.7 C), temperature source Oral, resp. rate 16, height  5\' 9"  (1.753 m), weight 172 lb 6.4 oz (78.2 kg), SpO2 97.00%.  Labs: Lab Results  Component Value Date   WBC 11.1* 12/15/2013   HGB 16.7 12/15/2013   HCT 47.8 12/15/2013   MCV 89.0 12/15/2013   PLT 144* 12/15/2013     Recent Labs Lab 12/15/13 0054  NA 134*  K 3.9  CL 95*  CO2 27    BUN 20  CREATININE 0.94  CALCIUM 8.5  GLUCOSE 131*    Lab Results  Component Value Date   CHOL 191 12/11/2013   HDL 35* 12/11/2013   LDLCALC 128* 12/11/2013   TRIG 142 12/11/2013     Diagnostic Studies/Procedures   Cardiac Cath 12/14/13 Indication: NSTEMI  Procedures performed:  1) Selective coronary angiography  2) Left heart catheterization  3) Left ventriculogram  Description of procedure:   The risks and indication of the procedure were explained. Consent was signed and placed on the chart. An appropriate timeout was taken prior to the procedure. After a normal Allen's test was confirmed, the left wrist was prepped and draped in the routine sterile fashion and anesthetized with 1% local lidocaine.  A 5 FR arterial sheath was then placed in the right radial artery using a modified Seldinger technique. !,000 units systemic heparin was administered (patient had Pradaxa this am). 3mg  IV verapamil was given through the sheath. Standard catheters including an IM, JL 3.5, JR4 and straight pigtail were used. All catheter exchanges were made over a wire.  Complications: None apparent  Findings:  Ao Pressure: 106/69 (83)  LV Pressure: 111/21/25  There was no signficant gradient across the aortic valve on pullback.  Left main: 50-60% ostial  Left anterior descending artery: The left anterior descending artery was completely occluded after a small diagonal branch. There was a 90% lesion in the ostium of the diagonal branch  Circumflex artery: There was 70% proximal lesion. The circumflex artery was then completely occluded after a moderate sized marginal branch. There were L to right collaterals to the distal RCA  Right coronary artery: The right coronary artery was completely occluded at its midportion. There were collaterals to the distal circumflex artery froma right ventricular branch.  The LIMA graft to the LAD was patent. There was 30% narrowing at the ostium of the LAD at the  proximal edge of the stent. The LAD itself was diffusely diseased with 70-80% diffuse disease distally. The LAD filled  the posterior descending branch by collaterals.  The saphenous vein graft to the right coronary artery was completely occluded at its origin at the stent site.  The saphenous vein graft to the circumflex system was completely occluded by prior study.  Left ventriculogram: The left ventriculogram performed in the RAO projection showed a dilated LV with EF 20-25% hypokinesis of the basilar to mid anterior wall and akinesis. Akinesis of the mid to distal anterior wall and entire apex. The basal to mid inferior wall was severely hypokinetic  Assessment:  1. Severe 3-VAD CAD  2. SVG x 2 (RCA and OM) chronically occluded  3. Patent LIMA to LAD  4. Severe LV dysfunction EF 20-25% with elevated LVEDP  Plan/Discussion:  Coronary anatomy is stable since prior cath in 2011. No clear culprit for NSTEMI or target for revascularization. Elevated filling pressures.    Dg Chest Portable 1 View 12/10/2013   CLINICAL DATA:  Dyspnea  EXAM: PORTABLE CHEST - 1 VIEW  COMPARISON:  portable chest x-ray dated September 17, 2013.  FINDINGS: The lungs are  well-expanded. The interstitial markings are increased diffusely and are more conspicuous than in the past. The cardiopericardial silhouette is enlarged and the pulmonary vascularity is engorged. The patient has undergone previous CABG. There is no significant pleural effusion. There is no pneumothorax. The observed portions of the bony thorax appear normal.  IMPRESSION: The findings are consistent with congestive heart failure which is likely both acute and chronic. There is interstitial edema but no alveolar edema.      Transthoracic Echo 09/07/2013  Impressions: - Comparison to prior study September 2011. Mild LVH with LVEF approximately 25%, wall motion abnormalities consistent with ischemic cardiomyopathy. Indeterminate diastolic function. Severe  left atrial enlargement. Poorly visualized sclerotic aortic valve. MAC with trivial mitral regurgitation. Trivial tricuspid regurgitation. Mild to moderate right atrial enlargement. Unable to assess PASP.  Nuclear Stress Test 09/19/2013  IMPRESSION:  High risk study for major cardiac events due to low ejection  fraction and multiple perfusion defects. There is a large fixed  anteroseptal scar. There is a moderate sized defect in the  inferolateral wall with minimal myocardium at jeopardy. There were  no diagnostic ST-segment abnormalities. No chest pain reported.  LVEF 29%     Discharge Medications     Medication List         aspirin EC 81 MG tablet  Take 81 mg by mouth daily.     carvedilol 25 MG tablet  Commonly known as:  COREG  TAKE ONE TABLET TWICE DAILY     CRESTOR 40 MG tablet  Generic drug:  rosuvastatin  TAKE ONE (1) TABLET BY MOUTH EVERY DAY     digoxin 0.125 MG tablet  Commonly known as:  LANOXIN  Take 125 mcg by mouth daily.     furosemide 40 MG tablet  Commonly known as:  LASIX  Take 40 mg by mouth daily.     glimepiride 2 MG tablet  Commonly known as:  AMARYL  Take 1 tablet (2 mg total) by mouth daily before breakfast.     hydrocodone-acetaminophen 5-500 MG per capsule  Commonly known as:  LORCET-HD  Take 1 capsule by mouth every 6 (six) hours as needed for pain.     isosorbide mononitrate 30 MG 24 hr tablet  Commonly known as:  IMDUR  Take 1 tablet (30 mg total) by mouth daily.     lisinopril 10 MG tablet  Commonly known as:  PRINIVIL,ZESTRIL  TAKE ONE (1) TABLET BY MOUTH EVERY DAY     metFORMIN 1000 MG tablet  Commonly known as:  GLUCOPHAGE  TAKE ONE TABLET TWICE DAILY     nitroGLYCERIN 0.4 MG SL tablet  Commonly known as:  NITROSTAT  Place 1 tablet (0.4 mg total) under the tongue every 5 (five) minutes as needed.     pantoprazole 40 MG tablet  Commonly known as:  PROTONIX  Take 1 tablet (40 mg total) by mouth daily.     potassium  chloride 10 MEQ tablet  Commonly known as:  K-DUR  TAKE ONE (1) TABLET BY MOUTH EVERY DAY     PRADAXA 150 MG Caps capsule  Generic drug:  dabigatran  TAKE ONE CAPSULE BY MOUTH TWICE A DAY     spironolactone 25 MG tablet  Commonly known as:  ALDACTONE  Take 25 mg by mouth daily.        Disposition   The patient will be discharged in stable condition to home. Discharge Orders   Future Appointments Provider Department Dept Phone   12/27/2013 1:50 PM  Jodelle Gross, NP [facility] [phone]   Future Orders Complete By Expires   Diet - low sodium heart healthy  As directed    Diet - low sodium heart healthy  As directed    Increase activity slowly  As directed    Increase activity slowly  As directed      Follow-up Information   Follow up with [name], NP On 12/27/2013. (@1 :30 pm)    Specialty:  Nurse Practitioner   Contact information:   [address] [place] [address] [phone]         Duration of Discharge Encounter: Greater than 30 minutes including physician and PA time.  SignedThereasa Parkin PA-C 12/16/2013, 10:13 AM  '

## 2013-12-14 NOTE — Progress Notes (Signed)
Pt to be discharged to home. C/O of chest pressure, stat EKG obtained, MD notified and in to see pt.discharge cancelled.

## 2013-12-14 NOTE — Progress Notes (Addendum)
CARDIAC REHAB PHASE I   PRE:  Rate/Rhythm: 66 afib    BP: sitting 115/68    SaO2:   Offered to ambulate pt however he declined. Declined MI ed or ex. I was able to discuss NTG ed with pt with some teach back. Pt c/o "indigestion" this am. Sts it started with breakfast. Sts its a pressure, points to lower sternum. Grunting and gasping in room. Pt had been trying to get dressed but stopped to lie down. I applied 2L O2, put back on telemetry and took BP. Pt asking for something for indigestion. Sts this pain is like his admission pain. RN tending to pt now. [phone]  Harriet Masson CES, ACSM 12/14/2013 9:44 AM

## 2013-12-14 NOTE — Progress Notes (Signed)
  Patient developed 10/10 CP similar to previous NSTEMI while being discharged. ECG non-acute.   I reviewed his previous cath and also discussed it with Dr. Excell Seltzer. I suspect we are unlikely to find any significant changes on his cath but given post-MI angina, I think we need to re-study him today.  He did receive dose of Pradaxa this am for AF so will plan L radial approach.   Given ongoing tobacco [place], poorly controlled DM2 and medical noncompliance not good candidate for re-do CABG. If cath stable, consider d/c later today.   Vincent Fabiano,MD 10:33 AM

## 2013-12-15 LAB — CBC
HCT: 47.8 % (ref 39.0–52.0)
MCV: 89 fL (ref 78.0–100.0)
Platelets: 144 10*3/uL — ABNORMAL LOW (ref 150–400)
RBC: 5.37 MIL/uL (ref 4.22–5.81)
WBC: 11.1 10*3/uL — ABNORMAL HIGH (ref 4.0–10.5)

## 2013-12-15 LAB — BASIC METABOLIC PANEL
CO2: 27 mEq/L (ref 19–32)
Calcium: 8.5 mg/dL (ref 8.4–10.5)
GFR calc Af Amer: >90 mL/min (ref >90)
Glucose, Bld: 131 mg/dL — ABNORMAL HIGH (ref 70–99)
Potassium: 3.9 mEq/L (ref 3.5–5.1)
Sodium: 134 mEq/L — ABNORMAL LOW (ref 135–145)

## 2013-12-15 LAB — GLUCOSE, CAPILLARY: Glucose-Capillary: 185 mg/dL — ABNORMAL HIGH (ref 70–99)

## 2013-12-15 MED ORDER — LISINOPRIL 10 MG PO TABS
10.0000 mg | ORAL_TABLET | Freq: Every day | ORAL | Status: DC
  Administered 2013-12-15 – 2013-12-16 (×2): 10 mg via ORAL
  Filled 2013-12-15 (×2): qty 1

## 2013-12-15 MED ORDER — ALUM & MAG HYDROXIDE-SIMETH 200-200-20 MG/5ML PO SUSP
15.0000 mL | ORAL | Status: DC | PRN
Start: 2013-12-15 — End: 2013-12-16

## 2013-12-15 MED ORDER — PANTOPRAZOLE SODIUM 40 MG PO TBEC
40.0000 mg | DELAYED_RELEASE_TABLET | Freq: Every day | ORAL | Status: DC
  Administered 2013-12-15 – 2013-12-16 (×2): 40 mg via ORAL
  Filled 2013-12-15 (×2): qty 1

## 2013-12-15 NOTE — Progress Notes (Signed)
Patient Name: [name] Date of Encounter: 12/15/2013     Active Problems:   DIABETES MELLITUS, TYPE II   Hyperlipidemia   TOBACCO ABUSE   Atrial fibrillation   Acute on chronic systolic heart failure   Non-ST elevated myocardial infarction (non-STEMI)   Non-compliance    SUBJECTIVE  Still having mild "indigestion" in lower precordium this am. Denies dyspnea.  CURRENT MEDS . aspirin EC  81 mg Oral Daily  . atorvastatin  40 mg Oral q1800  . carvedilol  12.5 mg Oral BID WC  . dabigatran  150 mg Oral Q12H  . digoxin  125 mcg Oral Daily  . furosemide  40 mg Oral Daily  . glimepiride  2 mg Oral QAC breakfast  . insulin aspart  0-20 Units Subcutaneous TID WC  . insulin aspart  0-5 Units Subcutaneous QHS  . isosorbide mononitrate  30 mg Oral Daily  . lisinopril  5 mg Oral Daily  . metFORMIN  1,000 mg Oral BID WC  . potassium chloride  10 mEq Oral BID  . spironolactone  25 mg Oral Daily    OBJECTIVE  Filed Vitals:   12/15/13 0400 12/15/13 0420 12/15/13 0500 12/15/13 0724  BP:  121/70  144/79  Pulse:      Temp: 98.2 F (36.8 C)   97.4 F (36.3 C)  TempSrc: Oral   Oral  Resp:      Height:      Weight:   185 lb 3 oz (84 kg)   SpO2: 98%   95%    Intake/Output Summary (Last 24 hours) at 12/15/13 0749 Last data filed at 12/14/13 2200  Gross per 24 hour  Intake    225 ml  Output   1050 ml  Net   -825 ml   Filed Weights   12/10/13 1245 12/13/13 1700 12/15/13 0500  Weight: 182 lb 15.7 oz (83 kg) 183 lb 13.8 oz (83.4 kg) 185 lb 3 oz (84 kg)    PHYSICAL EXAM  General: Pleasant, NAD.  Neuro: Alert and oriented X 3. Moves all extremities spontaneously.  Psych: Normal affect.  HEENT: Normal  Neck: Supple without bruits or JVD.  Lungs: Resp regular and unlabored, CTA.  Heart: Pulse is irregularly irregular with controlled ventricular response  Abdomen: Soft, non-tender, non-distended, BS + x 4.  Extremities: There is 1+ soft pitting edema bilaterally. Left  radial pulse intact.   Accessory Clinical Findings  CBC  Recent Labs  12/15/13 0054  WBC 11.1*  HGB 16.7  HCT 47.8  MCV 89.0  PLT 144*   Basic Metabolic Panel  Recent Labs  12/12/13 0906 12/15/13 0054  NA 130* 134*  K 3.7 3.9  CL 92* 95*  CO2 28 27  GLUCOSE 259* 131*  BUN 15 20  CREATININE 0.90 0.94  CALCIUM 8.0* 8.5   Liver Function Tests No results found for this basename: AST, ALT, ALKPHOS, BILITOT, PROT, ALBUMIN,  in the last 72 hours No results found for this basename: LIPASE, AMYLASE,  in the last 72 hours Cardiac Enzymes No results found for this basename: CKTOTAL, CKMB, CKMBINDEX, TROPONINI,  in the last 72 hours BNP No components found with this basename: POCBNP,  D-Dimer No results found for this basename: DDIMER,  in the last 72 hours Hemoglobin A1C No results found for this basename: HGBA1C,  in the last 72 hours Fasting Lipid Panel No results found for this basename: CHOL, HDL, LDLCALC, TRIG, CHOLHDL, LDLDIRECT,  in the last 72 hours  Thyroid Function Tests No results found for this basename: TSH, T4TOTAL, FREET3, T3FREE, THYROIDAB,  in the last 72 hours  TELE  Atrial fibrillation with controlled VR.  ECG  Repeat EKG pending.  Radiology/Studies  Dg Chest Portable 1 View  12/10/2013   CLINICAL DATA:  Dyspnea  EXAM: PORTABLE CHEST - 1 VIEW  COMPARISON:  portable chest x-ray dated September 17, 2013.  FINDINGS: The lungs are well-expanded. The interstitial markings are increased diffusely and are more conspicuous than in the past. The cardiopericardial silhouette is enlarged and the pulmonary vascularity is engorged. The patient has undergone previous CABG. There is no significant pleural effusion. There is no pneumothorax. The observed portions of the bony thorax appear normal.  IMPRESSION: The findings are consistent with congestive heart failure which is likely both acute and chronic. There is interstitial edema but no alveolar edema.    Electronically Signed   By: David  [place]   On: 12/10/2013 13:07    ASSESSMENT AND PLAN  1. NSTEMI in setting of afib with RVR . Had repeat cath yesterday because of severe chest pain. Coronary anatomy stable since prior cath in 2011. No target for revascularization. 2 ICM LVEF 25% .  3. Afib Rates controlled. Continue Pradaxa for anticoagulation  4. NIDDM. Very poor outpatient compliance with hemoglobin A1c of 14.9 Diabetes counselor has seen him. He is not interested in diabetes education.  5. Noncompliance  6. Tobacco abuse. Counseled.  7. Indigestion this am ? Cardiac vs GERD.  Will add protonix and prn Mylanta. Probable home Friday if no further problems.  Signed, Cassell Clement  MD

## 2013-12-16 LAB — GLUCOSE, CAPILLARY: Glucose-Capillary: 134 mg/dL — ABNORMAL HIGH (ref 70–99)

## 2013-12-16 MED ORDER — PANTOPRAZOLE SODIUM 40 MG PO TBEC: 40.0000 mg | DELAYED_RELEASE_TABLET | Freq: Every day | ORAL | Status: DC

## 2013-12-16 MED ORDER — LISINOPRIL 10 MG PO TABS: ORAL_TABLET | ORAL | Status: DC

## 2013-12-16 NOTE — Progress Notes (Signed)
Pt discharged at 1530 with belongings.. Transported to car per wheelchair

## 2013-12-16 NOTE — Progress Notes (Signed)
Patient Name: [name] Date of Encounter: 12/16/2013     Active Problems:   DIABETES MELLITUS, TYPE II   Hyperlipidemia   TOBACCO ABUSE   Atrial fibrillation   Acute on chronic systolic heart failure   Non-ST elevated myocardial infarction (non-STEMI)   Non-compliance    SUBJECTIVE  The patient denies any further severe chest pain.  He still complains of mild "indigestion" He denies any significant dyspnea and he is lying flat without respiratory distress. CURRENT MEDS . aspirin EC  81 mg Oral Daily  . atorvastatin  40 mg Oral q1800  . carvedilol  12.5 mg Oral BID WC  . dabigatran  150 mg Oral Q12H  . digoxin  125 mcg Oral Daily  . furosemide  40 mg Oral Daily  . glimepiride  2 mg Oral QAC breakfast  . insulin aspart  0-20 Units Subcutaneous TID WC  . insulin aspart  0-5 Units Subcutaneous QHS  . isosorbide mononitrate  30 mg Oral Daily  . lisinopril  10 mg Oral Daily  . metFORMIN  1,000 mg Oral BID WC  . pantoprazole  40 mg Oral Q1200  . potassium chloride  10 mEq Oral BID  . spironolactone  25 mg Oral Daily    OBJECTIVE  Filed Vitals:   12/15/13 2332 12/16/13 0412 12/16/13 0436 12/16/13 0745  BP: 102/63 124/74  126/67  Pulse: 82 74    Temp: 98.3 F (36.8 C) 98.3 F (36.8 C)  98.1 F (36.7 C)  TempSrc: Oral Oral  Oral  Resp: 16 16    Height:      Weight:   172 lb 6.4 oz (78.2 kg)   SpO2: 95% 94%  97%    Intake/Output Summary (Last 24 hours) at 12/16/13 0813 Last data filed at 12/16/13 0400  Gross per 24 hour  Intake    600 ml  Output   1450 ml  Net   -850 ml   Filed Weights   12/13/13 1700 12/15/13 0500 12/16/13 0436  Weight: 183 lb 13.8 oz (83.4 kg) 185 lb 3 oz (84 kg) 172 lb 6.4 oz (78.2 kg)    PHYSICAL EXAM  General: Pleasant, NAD.  Neuro: Alert and oriented X 3. Moves all extremities spontaneously.  Psych: Normal affect.  HEENT: Normal  Neck: Supple without bruits or JVD.  Lungs: Resp regular and unlabored, CTA.  Heart: Pulse  is irregularly irregular with controlled ventricular response  Abdomen: Soft, non-tender, non-distended, BS + x 4.  Extremities: There is 1+ soft pitting edema bilaterally. Left radial pulse intact.   Accessory Clinical Findings  CBC  Recent Labs  12/15/13 0054  WBC 11.1*  HGB 16.7  HCT 47.8  MCV 89.0  PLT 144*   Basic Metabolic Panel  Recent Labs  12/15/13 0054  NA 134*  K 3.9  CL 95*  CO2 27  GLUCOSE 131*  BUN 20  CREATININE 0.94  CALCIUM 8.5   Liver Function Tests No results found for this basename: AST, ALT, ALKPHOS, BILITOT, PROT, ALBUMIN,  in the last 72 hours No results found for this basename: LIPASE, AMYLASE,  in the last 72 hours Cardiac Enzymes No results found for this basename: CKTOTAL, CKMB, CKMBINDEX, TROPONINI,  in the last 72 hours BNP No components found with this basename: POCBNP,  D-Dimer No results found for this basename: DDIMER,  in the last 72 hours Hemoglobin A1C No results found for this basename: HGBA1C,  in the last 72 hours Fasting Lipid Panel No  results found for this basename: CHOL, HDL, LDLCALC, TRIG, CHOLHDL, LDLDIRECT,  in the last 72 hours Thyroid Function Tests No results found for this basename: TSH, T4TOTAL, FREET3, T3FREE, THYROIDAB,  in the last 72 hours  TELE  Atrial fibrillation with controlled VR.  ECG  EKG on 12/15/13 shows no acute changes  Radiology/Studies  Dg Chest Portable 1 View  12/10/2013   CLINICAL DATA:  Dyspnea  EXAM: PORTABLE CHEST - 1 VIEW  COMPARISON:  portable chest x-ray dated September 17, 2013.  FINDINGS: The lungs are well-expanded. The interstitial markings are increased diffusely and are more conspicuous than in the past. The cardiopericardial silhouette is enlarged and the pulmonary vascularity is engorged. The patient has undergone previous CABG. There is no significant pleural effusion. There is no pneumothorax. The observed portions of the bony thorax appear normal.  IMPRESSION: The  findings are consistent with congestive heart failure which is likely both acute and chronic. There is interstitial edema but no alveolar edema.   Electronically Signed   By: David  [place]   On: 12/10/2013 13:07    ASSESSMENT AND PLAN  1. NSTEMI in setting of afib with RVR . Had repeat cath on 12/14/13 because of severe chest pain. Coronary anatomy stable since prior cath in 2011. No target for revascularization.  Continue medical therapy  2. ischemic cardiomyopathy with LVEF 25% .  3. Afib Rates controlled. Continue Pradaxa for anticoagulation  4. NIDDM. Very poor outpatient compliance with hemoglobin A1c of 14.9 Diabetes counselor has seen him. He is not interested in diabetes education.  5. Noncompliance  6. Tobacco abuse. Counseled.  7. possible GERD with symptoms of indigestion improved since starting protonix  Plan: Stable for discharge today with close outpatient followup to try to ensure compliance with medication and diet.  Signed, Cassell Clement  MD

## 2013-12-16 NOTE — Progress Notes (Signed)
Rhonda Barrett PA notified of pt's C/O chest pain.

## 2013-12-16 NOTE — Progress Notes (Signed)
Pt asleep upon entering room.  Pt unresponsive to his name and did not wake until he was tapped on the shoulder.  Pt refused ambulation.  Offered to sit pt up in bed to complete education and he refused education as this time.  Alexia Freestone, MS, ACSM RCEP 9:24 AM 12/16/2013

## 2013-12-27 ENCOUNTER — Ambulatory Visit (INDEPENDENT_AMBULATORY_CARE_PROVIDER_SITE_OTHER): Payer: Medicare Other | Admitting: Adult Health

## 2013-12-27 ENCOUNTER — Encounter: Payer: Self-pay | Admitting: Adult Health

## 2013-12-27 VITALS — BP 137/68 | HR 98 | Ht 69.0 in | Wt 183.0 lb

## 2013-12-27 DIAGNOSIS — Z9119 Patient's noncompliance with other medical treatment and regimen: Secondary | ICD-10-CM

## 2013-12-27 DIAGNOSIS — I5023 Acute on chronic systolic (congestive) heart failure: Secondary | ICD-10-CM

## 2013-12-27 DIAGNOSIS — Z91199 Patient's noncompliance with other medical treatment and regimen due to unspecified reason: Secondary | ICD-10-CM

## 2013-12-27 DIAGNOSIS — I10 Essential (primary) hypertension: Secondary | ICD-10-CM

## 2013-12-27 MED ORDER — METOLAZONE 5 MG PO TABS: ORAL_TABLET | ORAL | Status: DC

## 2013-12-27 MED ORDER — POTASSIUM CHLORIDE CRYS ER 20 MEQ PO TBCR: 20.0000 meq | EXTENDED_RELEASE_TABLET | Freq: Two times a day (BID) | ORAL | Status: DC

## 2013-12-27 MED ORDER — FUROSEMIDE 40 MG PO TABS: 40.0000 mg | ORAL_TABLET | Freq: Two times a day (BID) | ORAL | Status: DC

## 2013-12-27 NOTE — Assessment & Plan Note (Signed)
Heart rate is not well controlled currently. He is on both coreg 25 mg BID and digoxin.  Compliance issues are a huge problem for him. He is on pradaxa but is not sure what he is taking and when

## 2013-12-27 NOTE — Progress Notes (Deleted)
Name: [name]    DOB: November 10, 1946  Age: 68 y.o.  MR#: [id number]       PCP:  Alice Reichert, MD      Insurance: Payor: MEDICARE / Plan: MEDICARE PART A AND B / Product Type: *No Product type* /   CC:    Chief Complaint  Patient presents with  . Atrial Fibrillation  . Congestive Heart Failure    Systolic    VS Filed Vitals:   12/27/13 1300  BP: 137/68  Pulse: 98  Height: 5\' 9"  (1.753 m)  Weight: 183 lb (83.008 kg)    Weights Current Weight  12/27/13 183 lb (83.008 kg)  12/16/13 172 lb 6.4 oz (78.2 kg)  12/16/13 172 lb 6.4 oz (78.2 kg)    Blood Pressure  BP Readings from Last 3 Encounters:  12/27/13 137/68  12/16/13 106/62  12/16/13 106/62     Admit date:  (Not on file) Last encounter with RMR:  Visit date not found   Allergy Review of patient's allergies indicates no known allergies.  Current Outpatient Prescriptions  Medication Sig Dispense Refill  . lisinopril (PRINIVIL,ZESTRIL) 10 MG tablet TAKE ONE (1) TABLET BY MOUTH EVERY DAY  30 tablet  6  . carvedilol (COREG) 25 MG tablet TAKE ONE TABLET TWICE DAILY  60 tablet  3  . CRESTOR 40 MG tablet TAKE ONE (1) TABLET BY MOUTH EVERY DAY  30 tablet  5  . digoxin (LANOXIN) 0.125 MG tablet Take 125 mcg by mouth daily.        . furosemide (LASIX) 40 MG tablet Take 40 mg by mouth daily.      Marland Kitchen glimepiride (AMARYL) 2 MG tablet Take 1 tablet (2 mg total) by mouth daily before breakfast.  90 tablet  3  . hydrocodone-acetaminophen (LORCET-HD) 5-500 MG per capsule Take 1 capsule by mouth every 6 (six) hours as needed for pain.       . isosorbide mononitrate (IMDUR) 30 MG 24 hr tablet Take 1 tablet (30 mg total) by mouth daily.  30 tablet  5  . metFORMIN (GLUCOPHAGE) 1000 MG tablet TAKE ONE TABLET TWICE DAILY  60 tablet  6  . nitroGLYCERIN (NITROSTAT) 0.4 MG SL tablet Place 1 tablet (0.4 mg total) under the tongue every 5 (five) minutes as needed.  25 tablet  6  . pantoprazole (PROTONIX) 40 MG tablet Take 1 tablet (40 mg  total) by mouth daily.  30 tablet  6  . potassium chloride (K-DUR) 10 MEQ tablet TAKE ONE (1) TABLET BY MOUTH EVERY DAY  30 tablet  6  . PRADAXA 150 MG CAPS TAKE ONE CAPSULE BY MOUTH TWICE A DAY  60 capsule  3  . spironolactone (ALDACTONE) 25 MG tablet Take 25 mg by mouth daily.         No current facility-administered medications for this visit.    Discontinued Meds:    Medications Discontinued During This Encounter  Medication Reason  . aspirin EC 81 MG tablet Error    Patient Active Problem List   Diagnosis Date Noted  . Non-compliance   . Non-ST elevated myocardial infarction (non-STEMI) 12/10/2013  . CHF (congestive heart failure) 12/10/2013  . Chest pain 09/06/2013  . Acute on chronic systolic heart failure 09/06/2013  . Chronic anticoagulation 04/13/2013  . History of diagnostic tests 04/13/2013  . Fall 04/13/2013  . Arteriosclerotic cardiovascular disease (ASCVD)   . Atrial fibrillation   . DDD (degenerative disc disease)   . Alcohol use   .  DIABETES MELLITUS, TYPE II 05/06/2010  . Hyperlipidemia 05/06/2010  . TOBACCO ABUSE 05/06/2010  . PERIPHERAL VASCULAR DISEASE 05/06/2010    LABS    Component Value Date/Time   NA 134* 12/15/2013 0054   NA 130* 12/12/2013 0906   NA 131* 12/11/2013 0240   K 3.9 12/15/2013 0054   K 3.7 12/12/2013 0906   K 3.5 12/11/2013 0240   CL 95* 12/15/2013 0054   CL 92* 12/12/2013 0906   CL 93* 12/11/2013 0240   CO2 27 12/15/2013 0054   CO2 28 12/12/2013 0906   CO2 26 12/11/2013 0240   GLUCOSE 131* 12/15/2013 0054   GLUCOSE 259* 12/12/2013 0906   GLUCOSE 353* 12/11/2013 0240   BUN 20 12/15/2013 0054   BUN 15 12/12/2013 0906   BUN 12 12/11/2013 0240   CREATININE 0.94 12/15/2013 0054   CREATININE 0.90 12/12/2013 0906   CREATININE 0.91 12/11/2013 0240   CALCIUM 8.5 12/15/2013 0054   CALCIUM 8.0* 12/12/2013 0906   CALCIUM 7.9* 12/11/2013 0240   GFRNONAA 85* 12/15/2013 0054   GFRNONAA 87* 12/12/2013 0906   GFRNONAA 86*  12/11/2013 0240   GFRAA >90 12/15/2013 0054   GFRAA >90 12/12/2013 0906   GFRAA >90 12/11/2013 0240   CMP     Component Value Date/Time   NA 134* 12/15/2013 0054   K 3.9 12/15/2013 0054   CL 95* 12/15/2013 0054   CO2 27 12/15/2013 0054   GLUCOSE 131* 12/15/2013 0054   BUN 20 12/15/2013 0054   CREATININE 0.94 12/15/2013 0054   CALCIUM 8.5 12/15/2013 0054   PROT 6.4 10/18/2011 0940   ALBUMIN 3.5 10/18/2011 0940   AST 12 10/18/2011 0940   ALT 14 10/18/2011 0940   ALKPHOS 49 10/18/2011 0940   BILITOT 0.2* 10/18/2011 0940   GFRNONAA 85* 12/15/2013 0054   GFRAA >90 12/15/2013 0054       Component Value Date/Time   WBC 11.1* 12/15/2013 0054   WBC 11.7* 12/11/2013 2013   WBC 10.5 12/11/2013 0240   HGB 16.7 12/15/2013 0054   HGB 15.6 12/11/2013 2013   HGB 15.9 12/11/2013 0240   HCT 47.8 12/15/2013 0054   HCT 44.4 12/11/2013 2013   HCT 45.9 12/11/2013 0240   MCV 89.0 12/15/2013 0054   MCV 89.5 12/11/2013 2013   MCV 89.1 12/11/2013 0240    Lipid Panel     Component Value Date/Time   CHOL 191 12/11/2013 0240   TRIG 142 12/11/2013 0240   HDL 35* 12/11/2013 0240   CHOLHDL 5.5 12/11/2013 0240   VLDL 28 12/11/2013 0240   LDLCALC 128* 12/11/2013 0240    ABG    Component Value Date/Time   TCO2 22 05/12/2008 0956     Lab Results  Component Value Date   TSH 2.880 12/10/2013   BNP (last 3 results)  Recent Labs  09/06/13 0807 12/10/13 1310 12/12/13 0906  PROBNP 4612.0* 9452.0* 4015.0*   Cardiac Panel (last 3 results) No results found for this basename: CKTOTAL, CKMB, TROPONINI, RELINDX,  in the last 72 hours  Iron/TIBC/Ferritin No results found for this basename: iron, tibc, ferritin     EKG Orders placed during the hospital encounter of 12/10/13  . ED EKG  . EKG 12-LEAD  . EKG 12-LEAD  . EKG 12-LEAD  . EKG 12-LEAD  . EKG 12-LEAD  . EKG 12-LEAD  . EKG 12-LEAD  . EKG 12-LEAD  . EKG 12-LEAD  . EKG  . EKG 12-LEAD  . EKG 12-LEAD  . EKG 12-LEAD  .  EKG  12-LEAD     Prior Assessment and Plan Problem List as of 12/27/2013   DIABETES MELLITUS, TYPE II   Last Assessment & Plan   04/13/2013 Office Visit Written 04/13/2013  5:02 PM by Kathlen Brunswick, MD     Patient reports continued suboptimal control of diabetes. A recent assessment of hemoglobin A1c is pending.    Hyperlipidemia   Last Assessment & Plan   04/13/2013 Office Visit Written 04/13/2013  5:12 PM by Kathlen Brunswick, MD     No recent lipid levels available for review; values are being sought from patient's PCP, Dr. [name].    TOBACCO ABUSE   Last Assessment & Plan   04/13/2013 Office Visit Written 04/13/2013  5:13 PM by Kathlen Brunswick, MD     Patient reports a reduction to 0.5 pack per day and hopes to discontinue tobacco use entirely.    PERIPHERAL VASCULAR DISEASE   Last Assessment & Plan   05/08/2011 Office Visit Written 05/08/2011  2:02 PM by Kathlen Brunswick, MD     Although patient has known peripheral vascular disease with previous surgical intervention and has symptoms highly suggestive for claudication, distal pulses are intact.  I do not believe that any further testing or intervention would be useful to him at the present time.    Arteriosclerotic cardiovascular disease (ASCVD)   Last Assessment & Plan   04/13/2013 Office Visit Written 04/13/2013  4:59 PM by Kathlen Brunswick, MD     No acute cardiac issues since 02/2011. Most recent echocardiogram in 12/2010 suggested an EF of 30-35%, representing borderline criteria for AICD implantation.  A repeat study will be obtained to reevaluate this issue.    Atrial fibrillation   Last Assessment & Plan   04/13/2013 Office Visit Written 04/13/2013  5:00 PM by Kathlen Brunswick, MD      Arrhythmia now appears constant, whereas it was initially paroxysmal. Appropriate anticoagulation with a novel oral agent. Patient stopped taking Pradaxa for 2 months when he could not afford it, but has reestablished his eligibility for $10 co-pay.  He was cautioned about interrupting this therapy.    DDD (degenerative disc disease)   Alcohol use   Chronic anticoagulation   History of diagnostic tests   Fall   Last Assessment & Plan   04/13/2013 Office Visit Written 04/13/2013  5:15 PM by Kathlen Brunswick, MD     Fall is of concern. The mechanism of this event is not clear based upon the patient's history, and loss of consciousness is possible. We find no orthostatic change in blood pressure at present. We will await a recurrent event before initiating testing for syncope.    Chest pain   Acute on chronic systolic heart failure   Non-ST elevated myocardial infarction (non-STEMI)   CHF (congestive heart failure)   Non-compliance       Imaging: Dg Chest Portable 1 View  12/10/2013   CLINICAL DATA:  Dyspnea  EXAM: PORTABLE CHEST - 1 VIEW  COMPARISON:  portable chest x-ray dated September 17, 2013.  FINDINGS: The lungs are well-expanded. The interstitial markings are increased diffusely and are more conspicuous than in the past. The cardiopericardial silhouette is enlarged and the pulmonary vascularity is engorged. The patient has undergone previous CABG. There is no significant pleural effusion. There is no pneumothorax. The observed portions of the bony thorax appear normal.  IMPRESSION: The findings are consistent with congestive heart failure which is likely both acute and chronic. There  is interstitial edema but no alveolar edema.   Electronically Signed   By: David  [place]   On: 12/10/2013 13:07

## 2013-12-27 NOTE — Assessment & Plan Note (Signed)
Continues to be a HUGE issue for him. I doubt he will do well.

## 2013-12-27 NOTE — Assessment & Plan Note (Signed)
He is up on his wt 11 lbs in 10 days and is non-compliant with salt free diet and uncertain of his medications. He is very edematous in his LEE to the knees.  I have discussed with him the need to be more compliant. Consideration for readmission to hospital for IV diureses. Discussed with Dr. Purvis SheffieldKoneswaran on site. Will try to increase medications by doubling the lasix, adding metolazone 5 mg daily for two days. He will have a BMET in 2 days and I will see him again on Friday. If no diureses or worsening symptoms we will send to hospital

## 2013-12-27 NOTE — Patient Instructions (Signed)
Your physician recommends that you schedule a follow-up appointment in: THIS Friday 12-30-13 TO BE SCHEDULED WHEN YOU LEAVE THE OFFICE TODAY  Your physician recommends that you return for lab work in: 2 DAYS (SLIPS GIVEN FOR BMET TO HAVE 12-29-13  Your physician has recommended you make the following change in your medication:   1) INCREASE LASIX TO 40MG  (ONE TABLET TWICE DAILY) 2) INCREASE POTASSIUM 20MEQ TWICE DAILY 2) TAKE METOLAZONE 5MG  ONCE DAILY FOR TWO DAYS ONLY (TAKE TODAY AND TOMORROW ONLY) DO NOT TAKE PAST 12-28-13

## 2013-12-27 NOTE — Progress Notes (Signed)
HPI: [name] is a 68 year old former patient of Dr. Dietrich Pates we are following for ongoing assessment and management of atrial fibrillation, CAD status post CABG in 1995, with drug-eluting stent in 2009 2011, ischemic cardiomyopathy with chronic systolic CHF with EF of 25% per echo in September of 2014.  The patient was recently discharged from [facility] on December 16 2013 after admission for CHF exacerbation, and A. fib with RVR, with history of noncompliance. He was also diagnosed with non-ST elevation MI with elevated cardiac enzymes. He is diuresed. The recurrent chest pain the patient had a repeat cardiac catheterization by Dr.Bensimhon revealing severe three-vessel CAD with SVG x2 to RCA and OM chronically occluded. Patent LIMA to LAD and severe LV dysfunction with an EF of 20-25% with elevated LVEDP. The coronary anatomy was not thought to be in need of intervention with no target areas for revascularization on date of discharge the patient's weight was 172 pounds. He was counseled on compliance.    He comes today with complaints of worsening edema and shortness of breath. He doesn't eat low salt diet and is uncertain about which medications he takes. He continues to smoke as well. He has gained 11 lbs since discharge 10 days ago.  No Known Allergies  Current Outpatient Prescriptions  Medication Sig Dispense Refill  . lisinopril (PRINIVIL,ZESTRIL) 10 MG tablet TAKE ONE (1) TABLET BY MOUTH EVERY DAY  30 tablet  6  . carvedilol (COREG) 25 MG tablet TAKE ONE TABLET TWICE DAILY  60 tablet  3  . CRESTOR 40 MG tablet TAKE ONE (1) TABLET BY MOUTH EVERY DAY  30 tablet  5  . digoxin (LANOXIN) 0.125 MG tablet Take 125 mcg by mouth daily.        . furosemide (LASIX) 40 MG tablet Take 40 mg by mouth daily.      Marland Kitchen glimepiride (AMARYL) 2 MG tablet Take 1 tablet (2 mg total) by mouth daily before breakfast.  90 tablet  3  . hydrocodone-acetaminophen (LORCET-HD) 5-500 MG per capsule Take 1 capsule  by mouth every 6 (six) hours as needed for pain.       . isosorbide mononitrate (IMDUR) 30 MG 24 hr tablet Take 1 tablet (30 mg total) by mouth daily.  30 tablet  5  . metFORMIN (GLUCOPHAGE) 1000 MG tablet TAKE ONE TABLET TWICE DAILY  60 tablet  6  . nitroGLYCERIN (NITROSTAT) 0.4 MG SL tablet Place 1 tablet (0.4 mg total) under the tongue every 5 (five) minutes as needed.  25 tablet  6  . pantoprazole (PROTONIX) 40 MG tablet Take 1 tablet (40 mg total) by mouth daily.  30 tablet  6  . potassium chloride (K-DUR) 10 MEQ tablet TAKE ONE (1) TABLET BY MOUTH EVERY DAY  30 tablet  6  . PRADAXA 150 MG CAPS TAKE ONE CAPSULE BY MOUTH TWICE A DAY  60 capsule  3  . spironolactone (ALDACTONE) 25 MG tablet Take 25 mg by mouth daily.         No current facility-administered medications for this visit.    Past Medical History  Diagnosis Date  . Coronary atherosclerosis of native coronary artery     Ant. MI in 4/95; PTCA of 90% prox & distal LAD unsuccessful-->  Urgent CABG-4/95 TO Cx; 50% RCA; ant. HK; EF of 50-55%; 08/2008: 3-V disease plus a  95% LIMA stenosis, treated with DES; occlusion of SVG to CX; RCA SVG stenosis--> PCI with DES  2010: in-stent restenosis in  the LIMA-->cutting balloon; EF of 35-40%;  07/2010: TO of SVG to     RCA-medical therapy advised; EF of 30-35% by echo in 12/2010  . Atrial fibrillation 1995    1995, 2009; bradycardia with beta blocker therapy  . Tobacco abuse     40-50 pack years; currently one pack per day  . Peripheral vascular disease 1995    Abdominal aortic obstruction 1995-->vascular surgery  . Hyperlipidemia   . Type 2 diabetes mellitus   . DDD (degenerative disc disease)     Discectomy and fusion at L4 and L5  . Alcohol use 1999    Excessive alcohol use- quit in 1999  . Chronic systolic heart failure     LVEF 25-30% 2011  . Non-compliance     Past Surgical History  Procedure Laterality Date  . Cholecystectomy  1995  . Lumbar fusion      +Discectomy;x2; L4 and  L5  . Aorto-femoral bypass graft  1995    WUJ:WJXBJYROS:Review of systems complete and found to be negative unless listed above  PHYSICAL EXAM BP 137/68  Pulse 98  Ht 5\' 9"  (1.753 m)  Wt 183 lb (83.008 kg)  BMI 27.01 kg/m2  General: Well developed, well nourished, in no acute distress Head: Eyes PERRLA, No xanthomas.   Normal cephalic and atramatic  Lungs: Clear bilaterally to auscultation and percussion. Heart: HRIR S1 S2, without MRG.  Pulses are 2+ & equal.            No carotid bruit. No JVD.  No abdominal bruits. No femoral bruits. Abdomen: Bowel sounds are positive, abdomen soft and non-tender without masses or                  Hernia's noted. Msk:  Back normal, normal gait. Normal strength and tone for age. Extremities: No clubbing, cyanosis 2+-3+ pitting edema to just above the knees left greater than right..  DP +1 Neuro: Alert and oriented X 3. Psych:  Flat affect, responds appropriately    ASSESSMENT AND PLAN

## 2013-12-30 ENCOUNTER — Encounter: Payer: Self-pay | Admitting: Cardiology

## 2013-12-30 ENCOUNTER — Ambulatory Visit (INDEPENDENT_AMBULATORY_CARE_PROVIDER_SITE_OTHER): Payer: Medicare Other | Admitting: Cardiology

## 2013-12-30 VITALS — BP 141/94 | HR 55 | Ht 69.0 in | Wt 184.0 lb

## 2013-12-30 DIAGNOSIS — I5023 Acute on chronic systolic (congestive) heart failure: Secondary | ICD-10-CM

## 2013-12-30 NOTE — Patient Instructions (Addendum)
Your physician recommends that you schedule a follow-up appointment in: Next week with K.Lawrence NP   Take  Metazole 5 mg TODAY and TOMORROW only

## 2013-12-30 NOTE — Progress Notes (Signed)
Patient ID: [name], male   DOB: 08-05-1946, 68 y.o.   MRN: [id number]      Clinical Summary Mr. Digirolamo is a 68 y.o.male seen today for follow up for ischemic cardiomyopathy  1. ICM - LVEF 25% by echo 08/2013, prior CABG in 1995 with subsequent stenting in 2009 and 2011. - recent admission Dec 2014 with decompensated heart failure, NSTEMI. Cath showed severe native vessel disease with chronically occluded SVG to OM and RCA, patent LIMA to LAD. Unchanged findings from cath in 2011, no targets for intervention. - recently seen in clinic with volume overload Dec 27, 2013 along with 11 pound weight gain. Discharge weight during recent admit was 172 pounds. Overload thought to be secondary to dietary and medication non-compliance.  - his lasix was doubled and started metolazone 5mg  daily x 2 days. Repeat BMET was ordered but has not been completed.  - he increased his lasix to 40mg  bid, did not fill metolazone. He is not sure why he did not get it filled.  - reports SOB and swelling unchanged from earlier visit.       Past Medical History  Diagnosis Date  . Coronary atherosclerosis of native coronary artery     Ant. MI in 4/95; PTCA of 90% prox & distal LAD unsuccessful-->  Urgent CABG-4/95 TO Cx; 50% RCA; ant. HK; EF of 50-55%; 08/2008: 3-V disease plus a  95% LIMA stenosis, treated with DES; occlusion of SVG to CX; RCA SVG stenosis--> PCI with DES  2010: in-stent restenosis in the LIMA-->cutting balloon; EF of 35-40%;  07/2010: TO of SVG to     RCA-medical therapy advised; EF of 30-35% by echo in 12/2010  . Atrial fibrillation 1995    1995, 2009; bradycardia with beta blocker therapy  . Tobacco abuse     40-50 pack years; currently one pack per day  . Peripheral vascular disease 1995    Abdominal aortic obstruction 1995-->vascular surgery  . Hyperlipidemia   . Type 2 diabetes mellitus   . DDD (degenerative disc disease)     Discectomy and fusion at L4 and L5  . Alcohol use 1999   Excessive alcohol use- quit in 1999  . Chronic systolic heart failure     LVEF 25-30% 2011  . Non-compliance      No Known Allergies   Current Outpatient Prescriptions  Medication Sig Dispense Refill  . carvedilol (COREG) 25 MG tablet TAKE ONE TABLET TWICE DAILY  60 tablet  3  . CRESTOR 40 MG tablet TAKE ONE (1) TABLET BY MOUTH EVERY DAY  30 tablet  5  . digoxin (LANOXIN) 0.125 MG tablet Take 125 mcg by mouth daily.        . furosemide (LASIX) 40 MG tablet Take 1 tablet (40 mg total) by mouth 2 (two) times daily.  60 tablet  6  . glimepiride (AMARYL) 2 MG tablet Take 1 tablet (2 mg total) by mouth daily before breakfast.  90 tablet  3  . hydrocodone-acetaminophen (LORCET-HD) 5-500 MG per capsule Take 1 capsule by mouth every 6 (six) hours as needed for pain.       . isosorbide mononitrate (IMDUR) 30 MG 24 hr tablet Take 1 tablet (30 mg total) by mouth daily.  30 tablet  5  . lisinopril (PRINIVIL,ZESTRIL) 10 MG tablet TAKE ONE (1) TABLET BY MOUTH EVERY DAY  30 tablet  6  . metFORMIN (GLUCOPHAGE) 1000 MG tablet TAKE ONE TABLET TWICE DAILY  60 tablet  6  .  metolazone (ZAROXOLYN) 5 MG tablet TAKE ONE 5MG  TABLET ONCE DAILY 12-27-13 AND 12-28-13 ONLY  30 tablet  0  . nitroGLYCERIN (NITROSTAT) 0.4 MG SL tablet Place 1 tablet (0.4 mg total) under the tongue every 5 (five) minutes as needed.  25 tablet  6  . pantoprazole (PROTONIX) 40 MG tablet Take 1 tablet (40 mg total) by mouth daily.  30 tablet  6  . potassium chloride (K-DUR) 10 MEQ tablet TAKE ONE (1) TABLET BY MOUTH EVERY DAY  30 tablet  6  . potassium chloride SA (K-DUR,KLOR-CON) 20 MEQ tablet Take 1 tablet (20 mEq total) by mouth 2 (two) times daily.  60 tablet  6  . PRADAXA 150 MG CAPS TAKE ONE CAPSULE BY MOUTH TWICE A DAY  60 capsule  3  . spironolactone (ALDACTONE) 25 MG tablet Take 25 mg by mouth daily.         No current facility-administered medications for this visit.     Past Surgical History  Procedure Laterality Date  .  Cholecystectomy  1995  . Lumbar fusion      +Discectomy;x2; L4 and L5  . Aorto-femoral bypass graft  1995     No Known Allergies    Family History  Problem Relation Age of Onset  . CAD Father      Social History Mr. Siedschlag reports that he has been smoking Cigarettes.  He has been smoking about 0.50 packs per day. He has never used smokeless tobacco. Mr. Schwer reports that he does not drink alcohol.   Review of Systems CONSTITUTIONAL: No weight loss, fever, chills, weakness or fatigue.  HEENT: Eyes: No visual loss, blurred vision, double vision or yellow sclerae.No hearing loss, sneezing, congestion, runny nose or sore throat.  SKIN: No rash or itching.  CARDIOVASCULAR: no chest pain, no orthopnea. + DOE RESPIRATORY: Shortness of breath GASTROINTESTINAL: No anorexia, nausea, vomiting or diarrhea. No abdominal pain or blood.  GENITOURINARY: No burning on urination, no polyuria NEUROLOGICAL: No headache, dizziness, syncope, paralysis, ataxia, numbness or tingling in the extremities. No change in bowel or bladder control.  MUSCULOSKELETAL: No muscle, back pain, joint pain or stiffness. + LE edema LYMPHATICS: No enlarged nodes. No history of splenectomy.  PSYCHIATRIC: No history of depression or anxiety.  ENDOCRINOLOGIC: No reports of sweating, cold or heat intolerance. No polyuria or polydipsia.  Marland Kitchen   Physical Examination p 55 bp 141/94 Wt 184 lbs BMI 27 Gen: resting comfortably, no acute distress HEENT: no scleral icterus, pupils equal round and reactive, no palptable cervical adenopathy,  CV: irreg, no m/r/g, no JVD Resp: Clear to auscultation bilaterally GI: abdomen is soft, non-tender, non-distended, normal bowel sounds, no hepatosplenomegaly MSK: extremities are warm, 3+ bilateral edema Skin: warm, no rash Neuro:  no focal deficits Psych: appropriate affect   Diagnostic Studies 12/10/13 Findings:  Ao Pressure: 106/69 (83)  LV Pressure: 111/21/25  There was no  signficant gradient across the aortic valve on pullback.  Left main: 50-60% ostial  Left anterior descending artery: The left anterior descending artery was completely occluded after a small diagonal Davinia Riccardi. There was a 90% lesion in the ostium of the diagonal Takiah Maiden  Circumflex artery: There was 70% proximal lesion. The circumflex artery was then completely occluded after a moderate sized marginal Tanise Russman. There were L to right collaterals to the distal RCA  Right coronary artery: The right coronary artery was completely occluded at its midportion. There were collaterals to the distal circumflex artery froma right ventricular Yazleen Molock.  The LIMA  graft to the LAD was patent. There was 30% narrowing at the ostium of the LAD at the proximal edge of the stent. The LAD itself was diffusely diseased with 70-80% diffuse disease distally. The LAD filled  the posterior descending Carmita Boom by collaterals.  The saphenous vein graft to the right coronary artery was completely occluded at its origin at the stent site.  The saphenous vein graft to the circumflex system was completely occluded by prior study.  Left ventriculogram: The left ventriculogram performed in the RAO projection showed a dilated LV with EF 20-25% hypokinesis of the basilar to mid anterior wall and akinesis. Akinesis of the mid to distal anterior wall and entire apex. The basal to mid inferior wall was severely hypokinetic  Assessment:  1. Severe 3-VAD CAD  2. SVG x 2 (RCA and OM) chronically occluded  3. Patent LIMA to LAD  4. Severe LV dysfunction EF 20-25% with elevated LVEDP     Assessment and Plan  1. ICM - remains volume overload, medication adherence remains a concern. He did not fill his metolazone as prescribed last visit, he is not sure why he didn't get it filled. He did double his lasix. Overall symptoms unchanged - encouraged to fill Rx for metolazone, take 2 days as originally written. Follow up next week for repeat  evaluation - discussed home health nursing to help with mediations, he is adamantly against this. His wife reports she will help him with his medications, will follow up next visit.       Antoine PocheJonathan F. Harbor Vanover, M.D., F.A.C.C.

## 2014-01-06 ENCOUNTER — Ambulatory Visit (INDEPENDENT_AMBULATORY_CARE_PROVIDER_SITE_OTHER): Payer: Medicare Other | Admitting: Adult Health

## 2014-01-06 ENCOUNTER — Encounter: Payer: Self-pay | Admitting: Adult Health

## 2014-01-06 VITALS — BP 97/60 | HR 66 | Ht 69.0 in | Wt 159.0 lb

## 2014-01-06 DIAGNOSIS — Z9119 Patient's noncompliance with other medical treatment and regimen: Secondary | ICD-10-CM

## 2014-01-06 DIAGNOSIS — I4891 Unspecified atrial fibrillation: Secondary | ICD-10-CM

## 2014-01-06 DIAGNOSIS — I5023 Acute on chronic systolic (congestive) heart failure: Secondary | ICD-10-CM

## 2014-01-06 DIAGNOSIS — Z91199 Patient's noncompliance with other medical treatment and regimen due to unspecified reason: Secondary | ICD-10-CM

## 2014-01-06 DIAGNOSIS — F17200 Nicotine dependence, unspecified, uncomplicated: Secondary | ICD-10-CM

## 2014-01-06 MED ORDER — FUROSEMIDE 40 MG PO TABS: 40.0000 mg | ORAL_TABLET | Freq: Every day | ORAL | Status: DC

## 2014-01-06 NOTE — Assessment & Plan Note (Signed)
He is advised to stop smoking but has no desire to do so.

## 2014-01-06 NOTE — Assessment & Plan Note (Signed)
He is doing better with the help of his wife who is now taking control of giving him his medications.

## 2014-01-06 NOTE — Progress Notes (Signed)
HPI: [name] is a very difficult 68 year old male patient with known history of ischemic cardiomyopathy with EF of 25% per echo in September 2014 with history of CAD and CABG in 1995, stenting in 2009 2011. Patient has a history of dietary and medical noncompliance. The patient was seen in the office in early January 2015 at which time the patient appeared fluid overloaded, Lasix was increased to twice a day with metolazone 5 mg daily x2 days. Unfortunately the patient did not take the medication, was followed by Dr. [name] on 12/30/2013. He continued to have lower extremity edema and shortness of breath.   Was seen by Dr. [name] is encouraged to fill his prescription for metolazone and take 2 days as originally written. Home health was recommended for ongoing outpatient treatment. He refused home health nurses, and preferred to allow his wife to treat him at home. Weight on last office visit 184 pounds.   He comes today having lost 25 lbs. His wife is making sure that he is taking his medications every day. He unfortunately continues to smoke, and has no plans to quit.     No Known Allergies  Current Outpatient Prescriptions  Medication Sig Dispense Refill  . carvedilol (COREG) 25 MG tablet TAKE ONE TABLET TWICE DAILY  60 tablet  3  . CRESTOR 40 MG tablet TAKE ONE (1) TABLET BY MOUTH EVERY DAY  30 tablet  5  . digoxin (LANOXIN) 0.125 MG tablet Take 125 mcg by mouth daily.        . furosemide (LASIX) 40 MG tablet Take 1 tablet (40 mg total) by mouth 2 (two) times daily.  60 tablet  6  . glimepiride (AMARYL) 2 MG tablet Take 1 tablet (2 mg total) by mouth daily before breakfast.  90 tablet  3  . hydrocodone-acetaminophen (LORCET-HD) 5-500 MG per capsule Take 1 capsule by mouth every 6 (six) hours as needed for pain.       . isosorbide mononitrate (IMDUR) 30 MG 24 hr tablet Take 1 tablet (30 mg total) by mouth daily.  30 tablet  5  . lisinopril (PRINIVIL,ZESTRIL) 10 MG tablet TAKE ONE (1) TABLET BY  MOUTH EVERY DAY  30 tablet  6  . metFORMIN (GLUCOPHAGE) 1000 MG tablet TAKE ONE TABLET TWICE DAILY  60 tablet  6  . metolazone (ZAROXOLYN) 5 MG tablet TAKE ONE 5MG  TABLET ONCE DAILY 12-27-13 AND 12-28-13 ONLY  30 tablet  0  . nitroGLYCERIN (NITROSTAT) 0.4 MG SL tablet Place 1 tablet (0.4 mg total) under the tongue every 5 (five) minutes as needed.  25 tablet  6  . pantoprazole (PROTONIX) 40 MG tablet Take 1 tablet (40 mg total) by mouth daily.  30 tablet  6  . potassium chloride (K-DUR) 10 MEQ tablet TAKE ONE (1) TABLET BY MOUTH EVERY DAY  30 tablet  6  . potassium chloride SA (K-DUR,KLOR-CON) 20 MEQ tablet Take 1 tablet (20 mEq total) by mouth 2 (two) times daily.  60 tablet  6  . PRADAXA 150 MG CAPS TAKE ONE CAPSULE BY MOUTH TWICE A DAY  60 capsule  3  . spironolactone (ALDACTONE) 25 MG tablet Take 25 mg by mouth daily.         No current facility-administered medications for this visit.    Past Medical History  Diagnosis Date  . Coronary atherosclerosis of native coronary artery     Ant. MI in 4/95; PTCA of 90% prox & distal LAD unsuccessful-->  Urgent CABG-4/95 TO  Cx; 50% RCA; ant. HK; EF of 50-55%; 08/2008: 3-V disease plus a  95% LIMA stenosis, treated with DES; occlusion of SVG to CX; RCA SVG stenosis--> PCI with DES  2010: in-stent restenosis in the LIMA-->cutting balloon; EF of 35-40%;  07/2010: TO of SVG to     RCA-medical therapy advised; EF of 30-35% by echo in 12/2010  . Atrial fibrillation 1995    1995, 2009; bradycardia with beta blocker therapy  . Tobacco abuse     40-50 pack years; currently one pack per day  . Peripheral vascular disease 1995    Abdominal aortic obstruction 1995-->vascular surgery  . Hyperlipidemia   . Type 2 diabetes mellitus   . DDD (degenerative disc disease)     Discectomy and fusion at L4 and L5  . Alcohol use 1999    Excessive alcohol use- quit in 1999  . Chronic systolic heart failure     LVEF 25-30% 2011  . Non-compliance     Past Surgical History   Procedure Laterality Date  . Cholecystectomy  1995  . Lumbar fusion      +Discectomy;x2; L4 and L5  . Aorto-femoral bypass graft  1995   ROS: Review of systems complete and found to be negative unless listed above  PHYSICAL EXAM BP 97/60  Pulse 66  Ht 5\' 9"  (1.753 m)  Wt 159 lb (72.122 kg)  BMI 23.47 kg/m2 General: Well developed, well nourished, in no acute distress Head: Eyes PERRLA, No xanthomas.   Normal cephalic and atramatic  Lungs: Clear bilaterally to auscultation and percussion. Heart: HRRR S1 S2, without MRG.  Pulses are 2+ & equal.            No carotid bruit. No JVD.  No abdominal bruits. No femoral bruits. Abdomen: Bowel sounds are positive, abdomen soft and non-tender without masses or  Hernia's noted. Msk:  Back normal, normal gait. Normal strength and tone for age. Extremities: No clubbing, cyanosis or edema.  DP +1 Neuro: Alert and oriented X 3. Psych:  Good affect, responds appropriately  :  ASSESSMENT AND PLAN

## 2014-01-06 NOTE — Assessment & Plan Note (Signed)
He is down 25 lbs since being back on medications. His wife has taken an active role in his care and is giving him his meds as directed. He is feeling and breathing better. No further edema. Will decrease his lasix/potassium to 40 mg daily now.  His is advised to weigh daily on a digital scale, if he gain 3-5 lbs in 24-48 hours he is to take an additional lasix 40 mg and potassium tablet. He is given a weight recording chart and is to avoid salt at all costs.

## 2014-01-06 NOTE — Patient Instructions (Addendum)
Your physician recommends that you schedule a follow-up appointment in: 1 month  Your physician recommends that you return for lab work in 1 week  BMET   Your physician recommends that you weigh, daily, at the same time every day, and in the same amount of clothing. Please record your daily weights on the handout provided and bring it to your next appointment.   If your weight goes over 3-5 lbs in 24-48 hours take an extra dose of Lasix and Potassium  Your physician has recommended you make the following change in your medication:  Take Lasix 40 mg daily Take Potassium 10 mEq daily

## 2014-01-06 NOTE — Progress Notes (Deleted)
Name: [name]    DOB: July 14, 1946  Age: 68 y.o.  MR#: [id number]       PCP:  Alice ReichertMCINNIS,ANGUS G, MD      Insurance: Payor: MEDICARE / Plan: MEDICARE PART A AND B / Product Type: *No Product type* /   CC:    Chief Complaint  Patient presents with  . Congestive Heart Failure    Chronic    VS Filed Vitals:   01/06/14 1318  BP: 97/60  Pulse: 66  Height: 5\' 9"  (1.753 m)  Weight: 159 lb (72.122 kg)    Weights Current Weight  01/06/14 159 lb (72.122 kg)  12/30/13 184 lb (83.462 kg)  12/27/13 183 lb (83.008 kg)    Blood Pressure  BP Readings from Last 3 Encounters:  01/06/14 97/60  12/30/13 141/94  12/27/13 137/68     Admit date:  (Not on file) Last encounter with RMR:  12/27/2013   Allergy Review of patient's allergies indicates no known allergies.  Current Outpatient Prescriptions  Medication Sig Dispense Refill  . carvedilol (COREG) 25 MG tablet TAKE ONE TABLET TWICE DAILY  60 tablet  3  . CRESTOR 40 MG tablet TAKE ONE (1) TABLET BY MOUTH EVERY DAY  30 tablet  5  . digoxin (LANOXIN) 0.125 MG tablet Take 125 mcg by mouth daily.        . furosemide (LASIX) 40 MG tablet Take 1 tablet (40 mg total) by mouth 2 (two) times daily.  60 tablet  6  . glimepiride (AMARYL) 2 MG tablet Take 1 tablet (2 mg total) by mouth daily before breakfast.  90 tablet  3  . hydrocodone-acetaminophen (LORCET-HD) 5-500 MG per capsule Take 1 capsule by mouth every 6 (six) hours as needed for pain.       . isosorbide mononitrate (IMDUR) 30 MG 24 hr tablet Take 1 tablet (30 mg total) by mouth daily.  30 tablet  5  . lisinopril (PRINIVIL,ZESTRIL) 10 MG tablet TAKE ONE (1) TABLET BY MOUTH EVERY DAY  30 tablet  6  . metFORMIN (GLUCOPHAGE) 1000 MG tablet TAKE ONE TABLET TWICE DAILY  60 tablet  6  . metolazone (ZAROXOLYN) 5 MG tablet TAKE ONE 5MG  TABLET ONCE DAILY 12-27-13 AND 12-28-13 ONLY  30 tablet  0  . nitroGLYCERIN (NITROSTAT) 0.4 MG SL tablet Place 1 tablet (0.4 mg total) under the tongue every 5 (five)  minutes as needed.  25 tablet  6  . pantoprazole (PROTONIX) 40 MG tablet Take 1 tablet (40 mg total) by mouth daily.  30 tablet  6  . potassium chloride (K-DUR) 10 MEQ tablet TAKE ONE (1) TABLET BY MOUTH EVERY DAY  30 tablet  6  . potassium chloride SA (K-DUR,KLOR-CON) 20 MEQ tablet Take 1 tablet (20 mEq total) by mouth 2 (two) times daily.  60 tablet  6  . PRADAXA 150 MG CAPS TAKE ONE CAPSULE BY MOUTH TWICE A DAY  60 capsule  3  . spironolactone (ALDACTONE) 25 MG tablet Take 25 mg by mouth daily.         No current facility-administered medications for this visit.    Discontinued Meds:   There are no discontinued medications.  Patient Active Problem List   Diagnosis Date Noted  . Non-compliance   . Non-ST elevated myocardial infarction (non-STEMI) 12/10/2013  . CHF (congestive heart failure) 12/10/2013  . Chest pain 09/06/2013  . Acute on chronic systolic heart failure 09/06/2013  . Chronic anticoagulation 04/13/2013  . History of diagnostic tests  04/13/2013  . Fall 04/13/2013  . Arteriosclerotic cardiovascular disease (ASCVD)   . Atrial fibrillation   . DDD (degenerative disc disease)   . Alcohol use   . DIABETES MELLITUS, TYPE II 05/06/2010  . Hyperlipidemia 05/06/2010  . TOBACCO ABUSE 05/06/2010  . PERIPHERAL VASCULAR DISEASE 05/06/2010    LABS    Component Value Date/Time   NA 134* 12/15/2013 0054   NA 130* 12/12/2013 0906   NA 131* 12/11/2013 0240   K 3.9 12/15/2013 0054   K 3.7 12/12/2013 0906   K 3.5 12/11/2013 0240   CL 95* 12/15/2013 0054   CL 92* 12/12/2013 0906   CL 93* 12/11/2013 0240   CO2 27 12/15/2013 0054   CO2 28 12/12/2013 0906   CO2 26 12/11/2013 0240   GLUCOSE 131* 12/15/2013 0054   GLUCOSE 259* 12/12/2013 0906   GLUCOSE 353* 12/11/2013 0240   BUN 20 12/15/2013 0054   BUN 15 12/12/2013 0906   BUN 12 12/11/2013 0240   CREATININE 0.94 12/15/2013 0054   CREATININE 0.90 12/12/2013 0906   CREATININE 0.91 12/11/2013 0240   CALCIUM 8.5 12/15/2013  0054   CALCIUM 8.0* 12/12/2013 0906   CALCIUM 7.9* 12/11/2013 0240   GFRNONAA 85* 12/15/2013 0054   GFRNONAA 87* 12/12/2013 0906   GFRNONAA 86* 12/11/2013 0240   GFRAA >90 12/15/2013 0054   GFRAA >90 12/12/2013 0906   GFRAA >90 12/11/2013 0240   CMP     Component Value Date/Time   NA 134* 12/15/2013 0054   K 3.9 12/15/2013 0054   CL 95* 12/15/2013 0054   CO2 27 12/15/2013 0054   GLUCOSE 131* 12/15/2013 0054   BUN 20 12/15/2013 0054   CREATININE 0.94 12/15/2013 0054   CALCIUM 8.5 12/15/2013 0054   PROT 6.4 10/18/2011 0940   ALBUMIN 3.5 10/18/2011 0940   AST 12 10/18/2011 0940   ALT 14 10/18/2011 0940   ALKPHOS 49 10/18/2011 0940   BILITOT 0.2* 10/18/2011 0940   GFRNONAA 85* 12/15/2013 0054   GFRAA >90 12/15/2013 0054       Component Value Date/Time   WBC 11.1* 12/15/2013 0054   WBC 11.7* 12/11/2013 2013   WBC 10.5 12/11/2013 0240   HGB 16.7 12/15/2013 0054   HGB 15.6 12/11/2013 2013   HGB 15.9 12/11/2013 0240   HCT 47.8 12/15/2013 0054   HCT 44.4 12/11/2013 2013   HCT 45.9 12/11/2013 0240   MCV 89.0 12/15/2013 0054   MCV 89.5 12/11/2013 2013   MCV 89.1 12/11/2013 0240    Lipid Panel     Component Value Date/Time   CHOL 191 12/11/2013 0240   TRIG 142 12/11/2013 0240   HDL 35* 12/11/2013 0240   CHOLHDL 5.5 12/11/2013 0240   VLDL 28 12/11/2013 0240   LDLCALC 128* 12/11/2013 0240    ABG    Component Value Date/Time   TCO2 22 05/12/2008 0956     Lab Results  Component Value Date   TSH 2.880 12/10/2013   BNP (last 3 results)  Recent Labs  09/06/13 0807 12/10/13 1310 12/12/13 0906  PROBNP 4612.0* 9452.0* 4015.0*   Cardiac Panel (last 3 results) No results found for this basename: CKTOTAL, CKMB, TROPONINI, RELINDX,  in the last 72 hours  Iron/TIBC/Ferritin No results found for this basename: iron, tibc, ferritin     EKG Orders placed during the hospital encounter of 12/10/13  . ED EKG  . EKG 12-LEAD  . EKG 12-LEAD  . EKG 12-LEAD  . EKG  12-LEAD  . EKG  12-LEAD  . EKG 12-LEAD  . EKG 12-LEAD  . EKG 12-LEAD  . EKG 12-LEAD  . EKG  . EKG 12-LEAD  . EKG 12-LEAD  . EKG 12-LEAD  . EKG 12-LEAD     Prior Assessment and Plan Problem List as of 01/06/2014     Cardiovascular and Mediastinum   Atrial fibrillation   Last Assessment & Plan   12/27/2013 Office Visit Written 12/27/2013  1:52 PM by Jodelle Gross, NP     Heart rate is not well controlled currently. He is on both coreg 25 mg BID and digoxin.  Compliance issues are a huge problem for him. He is on pradaxa but is not sure what he is taking and when    Non-ST elevated myocardial infarction (non-STEMI)   PERIPHERAL VASCULAR DISEASE   Last Assessment & Plan   05/08/2011 Office Visit Written 05/08/2011  2:02 PM by Kathlen Brunswick, MD     Although patient has known peripheral vascular disease with previous surgical intervention and has symptoms highly suggestive for claudication, distal pulses are intact.  I do not believe that any further testing or intervention would be useful to him at the present time.    Arteriosclerotic cardiovascular disease (ASCVD)   Last Assessment & Plan   04/13/2013 Office Visit Written 04/13/2013  4:59 PM by Kathlen Brunswick, MD     No acute cardiac issues since 02/2011. Most recent echocardiogram in 12/2010 suggested an EF of 30-35%, representing borderline criteria for AICD implantation.  A repeat study will be obtained to reevaluate this issue.    Acute on chronic systolic heart failure   Last Assessment & Plan   12/27/2013 Office Visit Written 12/27/2013  1:57 PM by Jodelle Gross, NP     He is up on his wt 11 lbs in 10 days and is non-compliant with salt free diet and uncertain of his medications. He is very edematous in his LEE to the knees.  I have discussed with him the need to be more compliant. Consideration for readmission to hospital for IV diureses. Discussed with Dr. Purvis Sheffield on site. Will try to increase medications by doubling the  lasix, adding metolazone 5 mg daily for two days. He will have a BMET in 2 days and I will see him again on Friday. If no diureses or worsening symptoms we will send to hospital     CHF (congestive heart failure)     Endocrine   DIABETES MELLITUS, TYPE II   Last Assessment & Plan   04/13/2013 Office Visit Written 04/13/2013  5:02 PM by Kathlen Brunswick, MD     Patient reports continued suboptimal control of diabetes. A recent assessment of hemoglobin A1c is pending.      Musculoskeletal and Integument   DDD (degenerative disc disease)     Other   Hyperlipidemia   Last Assessment & Plan   04/13/2013 Office Visit Written 04/13/2013  5:12 PM by Kathlen Brunswick, MD     No recent lipid levels available for review; values are being sought from patient's PCP, Dr. [name].    TOBACCO ABUSE   Last Assessment & Plan   04/13/2013 Office Visit Written 04/13/2013  5:13 PM by Kathlen Brunswick, MD     Patient reports a reduction to 0.5 pack per day and hopes to discontinue tobacco use entirely.    Alcohol use   Chronic anticoagulation   History of diagnostic tests   Fall   Last Assessment & Plan  04/13/2013 Office Visit Written 04/13/2013  5:15 PM by Kathlen Brunswick, MD     Fall is of concern. The mechanism of this event is not clear based upon the patient's history, and loss of consciousness is possible. We find no orthostatic change in blood pressure at present. We will await a recurrent event before initiating testing for syncope.    Chest pain   Non-compliance   Last Assessment & Plan   12/27/2013 Office Visit Written 12/27/2013  1:57 PM by Jodelle Gross, NP     Continues to be a HUGE issue for him. I doubt he will do well.        Imaging: Dg Chest Portable 1 View  12/10/2013   CLINICAL DATA:  Dyspnea  EXAM: PORTABLE CHEST - 1 VIEW  COMPARISON:  portable chest x-ray dated September 17, 2013.  FINDINGS: The lungs are well-expanded. The interstitial markings are increased diffusely  and are more conspicuous than in the past. The cardiopericardial silhouette is enlarged and the pulmonary vascularity is engorged. The patient has undergone previous CABG. There is no significant pleural effusion. There is no pneumothorax. The observed portions of the bony thorax appear normal.  IMPRESSION: The findings are consistent with congestive heart failure which is likely both acute and chronic. There is interstitial edema but no alveolar edema.   Electronically Signed   By: David  [place]   On: 12/10/2013 13:07

## 2014-01-18 ENCOUNTER — Other Ambulatory Visit (HOSPITAL_COMMUNITY): Payer: Self-pay | Admitting: Family Medicine

## 2014-01-18 ENCOUNTER — Ambulatory Visit (HOSPITAL_COMMUNITY)
Admission: RE | Admit: 2014-01-18 | Discharge: 2014-01-18 | Disposition: A | Discharge status: Home / Self Care | Payer: Medicare Other | Source: Ambulatory Visit | Attending: Family Medicine | Admitting: Family Medicine

## 2014-01-18 DIAGNOSIS — G319 Degenerative disease of nervous system, unspecified: Secondary | ICD-10-CM | POA: Insufficient documentation

## 2014-01-18 DIAGNOSIS — R413 Other amnesia: Secondary | ICD-10-CM

## 2014-01-30 ENCOUNTER — Encounter: Payer: Self-pay | Admitting: Cardiology

## 2014-01-30 ENCOUNTER — Ambulatory Visit (INDEPENDENT_AMBULATORY_CARE_PROVIDER_SITE_OTHER): Payer: Medicare Other | Admitting: Cardiology

## 2014-01-30 VITALS — BP 81/59 | HR 108 | Ht 69.0 in | Wt 158.0 lb

## 2014-01-30 DIAGNOSIS — I2589 Other forms of chronic ischemic heart disease: Secondary | ICD-10-CM

## 2014-01-30 DIAGNOSIS — I255 Ischemic cardiomyopathy: Secondary | ICD-10-CM

## 2014-01-30 DIAGNOSIS — I2510 Atherosclerotic heart disease of native coronary artery without angina pectoris: Secondary | ICD-10-CM

## 2014-01-30 MED ORDER — FUROSEMIDE 40 MG PO TABS: ORAL_TABLET | ORAL | Status: DC

## 2014-01-30 NOTE — Progress Notes (Signed)
Clinical Summary Mr. Sievers is a 68 y.o.male seen today for follow up of the following medical problems.   1. ICM  - LVEF 25% by echo 08/2013, prior CABG in 1995 with subsequent stenting in 2009 and 2011.  - previously had significant fluid overload, lasix was doubled and started on short course of metolazone with significant diuresis. - back on regular daily lasix regiment, significant weights loss of the last few weeks. Reports current issues with diarrhea.     Past Medical History  Diagnosis Date  . Coronary atherosclerosis of native coronary artery     Ant. MI in 4/95; PTCA of 90% prox & distal LAD unsuccessful-->  Urgent CABG-4/95 TO Cx; 50% RCA; ant. HK; EF of 50-55%; 08/2008: 3-V disease plus a  95% LIMA stenosis, treated with DES; occlusion of SVG to CX; RCA SVG stenosis--> PCI with DES  2010: in-stent restenosis in the LIMA-->cutting balloon; EF of 35-40%;  07/2010: TO of SVG to     RCA-medical therapy advised; EF of 30-35% by echo in 12/2010  . Atrial fibrillation 1995    1995, 2009; bradycardia with beta blocker therapy  . Tobacco abuse     40-50 pack years; currently one pack per day  . Peripheral vascular disease 1995    Abdominal aortic obstruction 1995-->vascular surgery  . Hyperlipidemia   . Type 2 diabetes mellitus   . DDD (degenerative disc disease)     Discectomy and fusion at L4 and L5  . Alcohol use 1999    Excessive alcohol use- quit in 1999  . Chronic systolic heart failure     LVEF 25-30% 2011  . Non-compliance      No Known Allergies   Current Outpatient Prescriptions  Medication Sig Dispense Refill  . carvedilol (COREG) 25 MG tablet TAKE ONE TABLET TWICE DAILY  60 tablet  3  . CRESTOR 40 MG tablet TAKE ONE (1) TABLET BY MOUTH EVERY DAY  30 tablet  5  . digoxin (LANOXIN) 0.125 MG tablet Take 125 mcg by mouth daily.        . furosemide (LASIX) 40 MG tablet Take 1 tablet (40 mg total) by mouth daily.  45 tablet  6  . glimepiride (AMARYL) 2 MG tablet  Take 1 tablet (2 mg total) by mouth daily before breakfast.  90 tablet  3  . hydrocodone-acetaminophen (LORCET-HD) 5-500 MG per capsule Take 1 capsule by mouth every 6 (six) hours as needed for pain.       . isosorbide mononitrate (IMDUR) 30 MG 24 hr tablet Take 1 tablet (30 mg total) by mouth daily.  30 tablet  5  . lisinopril (PRINIVIL,ZESTRIL) 10 MG tablet TAKE ONE (1) TABLET BY MOUTH EVERY DAY  30 tablet  6  . metFORMIN (GLUCOPHAGE) 1000 MG tablet TAKE ONE TABLET TWICE DAILY  60 tablet  6  . metolazone (ZAROXOLYN) 5 MG tablet TAKE ONE 5MG  TABLET ONCE DAILY 12-27-13 AND 12-28-13 ONLY  30 tablet  0  . nitroGLYCERIN (NITROSTAT) 0.4 MG SL tablet Place 1 tablet (0.4 mg total) under the tongue every 5 (five) minutes as needed.  25 tablet  6  . pantoprazole (PROTONIX) 40 MG tablet Take 1 tablet (40 mg total) by mouth daily.  30 tablet  6  . potassium chloride (K-DUR) 10 MEQ tablet TAKE ONE (1) TABLET BY MOUTH EVERY DAY  30 tablet  6  . potassium chloride SA (K-DUR,KLOR-CON) 20 MEQ tablet Take 1 tablet (20 mEq total) by mouth  2 (two) times daily.  60 tablet  6  . PRADAXA 150 MG CAPS TAKE ONE CAPSULE BY MOUTH TWICE A DAY  60 capsule  3  . spironolactone (ALDACTONE) 25 MG tablet Take 25 mg by mouth daily.         No current facility-administered medications for this visit.     Past Surgical History  Procedure Laterality Date  . Cholecystectomy  1995  . Lumbar fusion      +Discectomy;x2; L4 and L5  . Aorto-femoral bypass graft  1995     No Known Allergies    Family History  Problem Relation Age of Onset  . CAD Father      Social History [name] reports that he has been smoking Cigarettes.  He has been smoking about 0.50 packs per day. He has never used smokeless tobacco. [name] reports that he does not drink alcohol.   Review of Systems CONSTITUTIONAL: No weight loss, fever, chills, weakness or fatigue.  HEENT: Eyes: No visual loss, blurred vision, double vision or yellow  sclerae.No hearing loss, sneezing, congestion, runny nose or sore throat.  SKIN: No rash or itching.  CARDIOVASCULAR:  RESPIRATORY: No shortness of breath, cough or sputum.  GASTROINTESTINAL: No anorexia, nausea, vomiting or diarrhea. No abdominal pain or blood.  GENITOURINARY: No burning on urination, no polyuria NEUROLOGICAL: No headache, dizziness, syncope, paralysis, ataxia, numbness or tingling in the extremities. No change in bowel or bladder control.  MUSCULOSKELETAL: No muscle, back pain, joint pain or stiffness.  LYMPHATICS: No enlarged nodes. No history of splenectomy.  PSYCHIATRIC: No history of depression or anxiety.  ENDOCRINOLOGIC: No reports of sweating, cold or heat intolerance. No polyuria or polydipsia.  Marland Kitchen.   Physical Examination p 108 bp 81/59 Wt 158 lbs BMI 23 Gen: resting comfortably, no acute distress HEENT: no scleral icterus, pupils equal round and reactive, no palptable cervical adenopathy,  CV: RRR, no m/r/g, no JVD, no carotid bruits Resp: Clear to auscultation bilaterally GI: abdomen is soft, non-tender, non-distended, normal bowel sounds, no hepatosplenomegaly MSK: extremities are warm, no edema.  Skin: warm, no rash Neuro:  no focal deficits Psych: appropriate affect   Diagnostic Studies 12/10/13  Findings:  Ao Pressure: 106/69 (83)  LV Pressure: 111/21/25  There was no signficant gradient across the aortic valve on pullback.  Left main: 50-60% ostial  Left anterior descending artery: The left anterior descending artery was completely occluded after a small diagonal Rhaya Coale. There was a 90% lesion in the ostium of the diagonal Torrie Namba  Circumflex artery: There was 70% proximal lesion. The circumflex artery was then completely occluded after a moderate sized marginal Aidaly Cordner. There were L to right collaterals to the distal RCA  Right coronary artery: The right coronary artery was completely occluded at its midportion. There were collaterals to the distal  circumflex artery froma right ventricular Gurleen Larrivee.  The LIMA graft to the LAD was patent. There was 30% narrowing at the ostium of the LAD at the proximal edge of the stent. The LAD itself was diffusely diseased with 70-80% diffuse disease distally. The LAD filled  the posterior descending Nevaya Nagele by collaterals.  The saphenous vein graft to the right coronary artery was completely occluded at its origin at the stent site.  The saphenous vein graft to the circumflex system was completely occluded by prior study.  Left ventriculogram: The left ventriculogram performed in the RAO projection showed a dilated LV with EF 20-25% hypokinesis of the basilar to mid anterior wall and  akinesis. Akinesis of the mid to distal anterior wall and entire apex. The basal to mid inferior wall was severely hypokinetic  Assessment:  1. Severe 3-VAD CAD  2. SVG x 2 (RCA and OM) chronically occluded  3. Patent LIMA to LAD  4. Severe LV dysfunction EF 20-25% with elevated LVEDP     Assessment and Plan  1. ICM  - signifncant diuresis noted at last follow up with NP Lawrence (Wt 185 to 159) - weight continues to be down, reports recent increased problems with diarrhea. With low bp and elevated bp likely hypovolemic. Denies any lightheadedness or dizziness.  - will change lasix to every other day, he is to schedule an appt with his PCP to address his GI issues.    Follow up 1 month    Antoine Poche, M.D., F.A.C.C.

## 2014-01-30 NOTE — Patient Instructions (Signed)
Your physician recommends that you schedule a follow-up appointment in: 1 month with Joni ReiningKathryn Lawrence ,NP  Your physician has recommended you make the following change in your medication:  Stop Metolazone Take Lasix 40 mg every other day

## 2014-02-28 ENCOUNTER — Encounter: Payer: Self-pay | Admitting: Cardiology

## 2014-02-28 ENCOUNTER — Ambulatory Visit (INDEPENDENT_AMBULATORY_CARE_PROVIDER_SITE_OTHER): Payer: Medicare Other | Admitting: Cardiology

## 2014-02-28 VITALS — BP 87/51 | HR 71 | Ht 69.0 in | Wt 151.0 lb

## 2014-02-28 DIAGNOSIS — I2510 Atherosclerotic heart disease of native coronary artery without angina pectoris: Secondary | ICD-10-CM

## 2014-02-28 DIAGNOSIS — I2589 Other forms of chronic ischemic heart disease: Secondary | ICD-10-CM

## 2014-02-28 DIAGNOSIS — I4891 Unspecified atrial fibrillation: Secondary | ICD-10-CM

## 2014-02-28 DIAGNOSIS — I255 Ischemic cardiomyopathy: Secondary | ICD-10-CM

## 2014-02-28 MED ORDER — LISINOPRIL 5 MG PO TABS: 5.0000 mg | ORAL_TABLET | Freq: Every day | ORAL | Status: AC

## 2014-02-28 NOTE — Progress Notes (Signed)
Clinical Summary [name] is a 68 y.o.male seen today for follow up of the following medical problems.   1. ICM  - LVEF 25% by echo 08/2013, prior CABG in 1995 with subsequent stenting in 2009 and 2011.  - last visit changed lasix to every other day in setting of diarrhea with extra volume losses and hypotension - since making change denies any signicant SOB, no LE. No chest pain  2. Afib - denies any recent palpitations - compliant with pradaxa  3. Diarrhea - referred to GI - still with intermittent episodes  Past Medical History  Diagnosis Date  . Coronary atherosclerosis of native coronary artery     Ant. MI in 4/95; PTCA of 90% prox & distal LAD unsuccessful-->  Urgent CABG-4/95 TO Cx; 50% RCA; ant. HK; EF of 50-55%; 08/2008: 3-V disease plus a  95% LIMA stenosis, treated with DES; occlusion of SVG to CX; RCA SVG stenosis--> PCI with DES  2010: in-stent restenosis in the LIMA-->cutting balloon; EF of 35-40%;  07/2010: TO of SVG to     RCA-medical therapy advised; EF of 30-35% by echo in 12/2010  . Atrial fibrillation 1995    1995, 2009; bradycardia with beta blocker therapy  . Tobacco abuse     40-50 pack years; currently one pack per day  . Peripheral vascular disease 1995    Abdominal aortic obstruction 1995-->vascular surgery  . Hyperlipidemia   . Type 2 diabetes mellitus   . DDD (degenerative disc disease)     Discectomy and fusion at L4 and L5  . Alcohol use 1999    Excessive alcohol use- quit in 1999  . Chronic systolic heart failure     LVEF 25-30% 2011  . Non-compliance      No Known Allergies   Current Outpatient Prescriptions  Medication Sig Dispense Refill  . carvedilol (COREG) 25 MG tablet TAKE ONE TABLET TWICE DAILY  60 tablet  3  . CRESTOR 40 MG tablet TAKE ONE (1) TABLET BY MOUTH EVERY DAY  30 tablet  5  . digoxin (LANOXIN) 0.125 MG tablet Take 125 mcg by mouth daily.        . furosemide (LASIX) 40 MG tablet Take 40 mg every other day.  30 tablet   3  . glimepiride (AMARYL) 2 MG tablet Take 1 tablet (2 mg total) by mouth daily before breakfast.  90 tablet  3  . hydrocodone-acetaminophen (LORCET-HD) 5-500 MG per capsule Take 1 capsule by mouth every 6 (six) hours as needed for pain.       . isosorbide mononitrate (IMDUR) 30 MG 24 hr tablet Take 1 tablet (30 mg total) by mouth daily.  30 tablet  5  . lisinopril (PRINIVIL,ZESTRIL) 10 MG tablet TAKE ONE (1) TABLET BY MOUTH EVERY DAY  30 tablet  6  . metFORMIN (GLUCOPHAGE) 1000 MG tablet TAKE ONE TABLET TWICE DAILY  60 tablet  6  . nitroGLYCERIN (NITROSTAT) 0.4 MG SL tablet Place 1 tablet (0.4 mg total) under the tongue every 5 (five) minutes as needed.  25 tablet  6  . pantoprazole (PROTONIX) 40 MG tablet Take 1 tablet (40 mg total) by mouth daily.  30 tablet  6  . potassium chloride (K-DUR) 10 MEQ tablet TAKE ONE (1) TABLET BY MOUTH EVERY DAY  30 tablet  6  . potassium chloride SA (K-DUR,KLOR-CON) 20 MEQ tablet Take 1 tablet (20 mEq total) by mouth 2 (two) times daily.  60 tablet  6  .  PRADAXA 150 MG CAPS TAKE ONE CAPSULE BY MOUTH TWICE A DAY  60 capsule  3  . spironolactone (ALDACTONE) 25 MG tablet Take 25 mg by mouth daily.         No current facility-administered medications for this visit.     Past Surgical History  Procedure Laterality Date  . Cholecystectomy  1995  . Lumbar fusion      +Discectomy;x2; L4 and L5  . Aorto-femoral bypass graft  1995     No Known Allergies    Family History  Problem Relation Age of Onset  . CAD Father      Social History [name] reports that he has been smoking Cigarettes.  He has been smoking about 0.50 packs per day. He has never used smokeless tobacco. [name] reports that he does not drink alcohol.   Review of Systems CONSTITUTIONAL: No weight loss, fever, chills, weakness or fatigue.  HEENT: Eyes: No visual loss, blurred vision, double vision or yellow sclerae.No hearing loss, sneezing, congestion, runny nose or sore  throat.  SKIN: No rash or itching.  CARDIOVASCULAR: per HPI RESPIRATORY: No shortness of breath, cough or sputum.  GASTROINTESTINAL: diarrhea GENITOURINARY: No burning on urination, no polyuria NEUROLOGICAL: No headache, dizziness, syncope, paralysis, ataxia, numbness or tingling in the extremities. No change in bowel or bladder control.  MUSCULOSKELETAL: No muscle, back pain, joint pain or stiffness.  LYMPHATICS: No enlarged nodes. No history of splenectomy.  PSYCHIATRIC: No history of depression or anxiety.  ENDOCRINOLOGIC: No reports of sweating, cold or heat intolerance. No polyuria or polydipsia.  Marland Kitchen.   Physical Examination p 71 bp 90/60 Wt 151 lbs BMI 22 Gen: resting comfortably, no acute distress HEENT: no scleral icterus, pupils equal round and reactive, no palptable cervical adenopathy,  CV: RRR, no m/r/g, no JVD, no carotid bruits Resp: Clear to auscultation bilaterally GI: abdomen is soft, non-tender, non-distended, normal bowel sounds, no hepatosplenomegaly MSK: extremities are warm, no edema.  Skin: warm, no rash Neuro:  no focal deficits Psych: appropriate affect   Diagnostic Studies 12/10/13  Findings:  Ao Pressure: 106/69 (83)  LV Pressure: 111/21/25  There was no signficant gradient across the aortic valve on pullback.  Left main: 50-60% ostial  Left anterior descending artery: The left anterior descending artery was completely occluded after a small diagonal branch. There was a 90% lesion in the ostium of the diagonal branch  Circumflex artery: There was 70% proximal lesion. The circumflex artery was then completely occluded after a moderate sized marginal branch. There were L to right collaterals to the distal RCA  Right coronary artery: The right coronary artery was completely occluded at its midportion. There were collaterals to the distal circumflex artery froma right ventricular branch.  The LIMA graft to the LAD was patent. There was 30% narrowing at the  ostium of the LAD at the proximal edge of the stent. The LAD itself was diffusely diseased with 70-80% diffuse disease distally. The LAD filled  the posterior descending branch by collaterals.  The saphenous vein graft to the right coronary artery was completely occluded at its origin at the stent site.  The saphenous vein graft to the circumflex system was completely occluded by prior study.  Left ventriculogram: The left ventriculogram performed in the RAO projection showed a dilated LV with EF 20-25% hypokinesis of the basilar to mid anterior wall and akinesis. Akinesis of the mid to distal anterior wall and entire apex. The basal to mid inferior wall was severely hypokinetic  Assessment:  1. Severe 3-VAD CAD  2. SVG x 2 (RCA and OM) chronically occluded  3. Patent LIMA to LAD  4. Severe LV dysfunction EF 20-25% with elevated LVEDP     Assessment and Plan  1. ICM  - last visit changed lasix to every other day due to diarrhea and extra volume loss - bp remains low, will decrease lisinopril to 5 mg daily, nursing bp check in 1 week, if remains low will decrease coreg.  2. Afib - no current symptoms, continue current meds   3. Diarrhea - continues, patient to be evaluted by GI   Follow up 3 months   Antoine Poche, M.D., F.A.C.C.

## 2014-02-28 NOTE — Patient Instructions (Signed)
Your physician recommends that you schedule a follow-up appointment in: 3 months   Please return in 1 week for nurse visit-blood pressure check   Your physician has recommended you make the following change in your medication:     DECREASE  Lisinopril to 5 mg daily   Thank you for choosing [facility] !

## 2014-03-07 ENCOUNTER — Other Ambulatory Visit: Payer: Self-pay | Admitting: Cardiology

## 2014-03-07 ENCOUNTER — Ambulatory Visit (INDEPENDENT_AMBULATORY_CARE_PROVIDER_SITE_OTHER): Payer: Medicare Other

## 2014-03-07 VITALS — BP 124/70 | HR 64 | Wt 172.0 lb

## 2014-03-07 DIAGNOSIS — Z0130 Encounter for examination of blood pressure without abnormal findings: Secondary | ICD-10-CM

## 2014-03-07 DIAGNOSIS — Z136 Encounter for screening for cardiovascular disorders: Secondary | ICD-10-CM

## 2014-03-07 NOTE — Patient Instructions (Signed)
Continue taking medications as directed by Dr.Branch from last weeks visit which was :   Lasix  40 mg every other day  Lisinopril 5 mg daily    I will call you if Dr.Branch has different instructions for you      Thank you for choosing [facility] !

## 2014-03-09 ENCOUNTER — Telehealth: Payer: Self-pay

## 2014-03-09 NOTE — Telephone Encounter (Signed)
Message copied by Nori RiisARLTON, Aela Bohan A on Thu Mar 09, 2014  8:58 AM ------      Message from: [place] Point[place]ANCH, JONATHAN F      Created: Wed Mar 08, 2014 12:33 PM      Regarding: RE: bp check        Everything looks good, continue current meds            Dina RichJonathan Branch MD      ----- Message -----         From: Nori Riisatherine A Lavar Rosenzweig, RN         Sent: 03/07/2014   4:27 PM           To: Antoine PocheJonathan F Branch, MD      Subject: bp check                                                 I saw Mr.Stehr today for fu bp check      124/70 hr 66 SaO2 99%            Wt: 172 lbs up 21 # from last week -no further diarrhea             Previous weights  3/10   151 lbs                                     2/9    158                                     1/16   159                                     1/9    184            He has no SOB,ankles are bony ! His clothes are loose on him,I don't know where he put 20 lbs            Still at lasix 40 mg qod lisinopril 5 mg qd       ------

## 2014-03-09 NOTE — Telephone Encounter (Signed)
LM for pt that Dr.branch is happy with bp and make no changes to meds

## 2014-03-16 NOTE — Progress Notes (Signed)
Blood pressure looks good, continue current meds   Dina RichJonathan [place] Markgraf MD

## 2014-03-18 ENCOUNTER — Emergency Department (HOSPITAL_COMMUNITY): Payer: Medicare Other

## 2014-03-18 ENCOUNTER — Inpatient Hospital Stay (HOSPITAL_COMMUNITY)
Admission: EM | Admit: 2014-03-18 | Discharge: 2014-03-22 | DRG: 683 | Disposition: A | Discharge status: Home Health | Payer: Medicare Other | Attending: Family Medicine | Admitting: Family Medicine

## 2014-03-18 ENCOUNTER — Encounter (HOSPITAL_COMMUNITY): Payer: Self-pay | Admitting: Emergency Medicine

## 2014-03-18 DIAGNOSIS — N179 Acute kidney failure, unspecified: Principal | ICD-10-CM | POA: Diagnosis present

## 2014-03-18 DIAGNOSIS — Z9119 Patient's noncompliance with other medical treatment and regimen: Secondary | ICD-10-CM

## 2014-03-18 DIAGNOSIS — I129 Hypertensive chronic kidney disease with stage 1 through stage 4 chronic kidney disease, or unspecified chronic kidney disease: Secondary | ICD-10-CM | POA: Diagnosis present

## 2014-03-18 DIAGNOSIS — I2510 Atherosclerotic heart disease of native coronary artery without angina pectoris: Secondary | ICD-10-CM | POA: Diagnosis present

## 2014-03-18 DIAGNOSIS — F17200 Nicotine dependence, unspecified, uncomplicated: Secondary | ICD-10-CM | POA: Diagnosis present

## 2014-03-18 DIAGNOSIS — I509 Heart failure, unspecified: Secondary | ICD-10-CM | POA: Diagnosis present

## 2014-03-18 DIAGNOSIS — I4891 Unspecified atrial fibrillation: Secondary | ICD-10-CM | POA: Diagnosis present

## 2014-03-18 DIAGNOSIS — I959 Hypotension, unspecified: Secondary | ICD-10-CM | POA: Diagnosis present

## 2014-03-18 DIAGNOSIS — Z8249 Family history of ischemic heart disease and other diseases of the circulatory system: Secondary | ICD-10-CM

## 2014-03-18 DIAGNOSIS — E785 Hyperlipidemia, unspecified: Secondary | ICD-10-CM | POA: Diagnosis present

## 2014-03-18 DIAGNOSIS — E872 Acidosis, unspecified: Secondary | ICD-10-CM | POA: Diagnosis present

## 2014-03-18 DIAGNOSIS — I5022 Chronic systolic (congestive) heart failure: Secondary | ICD-10-CM | POA: Diagnosis present

## 2014-03-18 DIAGNOSIS — S8000XA Contusion of unspecified knee, initial encounter: Secondary | ICD-10-CM | POA: Diagnosis present

## 2014-03-18 DIAGNOSIS — Z951 Presence of aortocoronary bypass graft: Secondary | ICD-10-CM

## 2014-03-18 DIAGNOSIS — I2589 Other forms of chronic ischemic heart disease: Secondary | ICD-10-CM | POA: Diagnosis present

## 2014-03-18 DIAGNOSIS — Z9861 Coronary angioplasty status: Secondary | ICD-10-CM

## 2014-03-18 DIAGNOSIS — I739 Peripheral vascular disease, unspecified: Secondary | ICD-10-CM | POA: Diagnosis present

## 2014-03-18 DIAGNOSIS — E875 Hyperkalemia: Secondary | ICD-10-CM | POA: Diagnosis present

## 2014-03-18 DIAGNOSIS — Z91199 Patient's noncompliance with other medical treatment and regimen due to unspecified reason: Secondary | ICD-10-CM

## 2014-03-18 DIAGNOSIS — N189 Chronic kidney disease, unspecified: Secondary | ICD-10-CM | POA: Diagnosis present

## 2014-03-18 DIAGNOSIS — E871 Hypo-osmolality and hyponatremia: Secondary | ICD-10-CM | POA: Diagnosis present

## 2014-03-18 DIAGNOSIS — Z7901 Long term (current) use of anticoagulants: Secondary | ICD-10-CM

## 2014-03-18 DIAGNOSIS — R809 Proteinuria, unspecified: Secondary | ICD-10-CM | POA: Diagnosis present

## 2014-03-18 DIAGNOSIS — Z79899 Other long term (current) drug therapy: Secondary | ICD-10-CM

## 2014-03-18 DIAGNOSIS — E861 Hypovolemia: Secondary | ICD-10-CM | POA: Diagnosis present

## 2014-03-18 DIAGNOSIS — Z981 Arthrodesis status: Secondary | ICD-10-CM

## 2014-03-18 DIAGNOSIS — E119 Type 2 diabetes mellitus without complications: Secondary | ICD-10-CM | POA: Diagnosis present

## 2014-03-18 LAB — CBC WITH DIFFERENTIAL/PLATELET
Basophils Absolute: 0.1 10*3/uL (ref 0.0–0.1)
Basophils Relative: 1 % (ref 0–1)
EOS ABS: 0.2 10*3/uL (ref 0.0–0.7)
EOS PCT: 1 % (ref 0–5)
HCT: 44.8 % (ref 39.0–52.0)
Hemoglobin: 15.8 g/dL (ref 13.0–17.0)
LYMPHS ABS: 1.5 10*3/uL (ref 0.7–4.0)
Lymphocytes Relative: 11 % — ABNORMAL LOW (ref 12–46)
MCH: 31.5 pg (ref 26.0–34.0)
MCHC: 35.3 g/dL (ref 30.0–36.0)
MCV: 89.4 fL (ref 78.0–100.0)
Monocytes Absolute: 1.3 10*3/uL — ABNORMAL HIGH (ref 0.1–1.0)
Monocytes Relative: 10 % (ref 3–12)
Neutro Abs: 10.4 10*3/uL — ABNORMAL HIGH (ref 1.7–7.7)
Neutrophils Relative %: 77 % (ref 43–77)
Platelets: 175 10*3/uL (ref 150–400)
RBC: 5.01 MIL/uL (ref 4.22–5.81)
RDW: 15.2 % (ref 11.5–15.5)
WBC: 13.5 10*3/uL — ABNORMAL HIGH (ref 4.0–10.5)

## 2014-03-18 LAB — CBG MONITORING, ED: Glucose-Capillary: 191 mg/dL — ABNORMAL HIGH (ref 70–99)

## 2014-03-18 LAB — ETHANOL: Alcohol, Ethyl (B): <11 mg/dL (ref 0–11)

## 2014-03-18 MED ORDER — SODIUM CHLORIDE 0.9 % IV BOLUS (SEPSIS)
500.0000 mL | Freq: Once | INTRAVENOUS | Status: AC
  Administered 2014-03-18: 500 mL via INTRAVENOUS

## 2014-03-18 MED ORDER — INSULIN ASPART 100 UNIT/ML IV SOLN
3.0000 [IU] | Freq: Once | INTRAVENOUS | Status: AC
  Administered 2014-03-18: 3 [IU] via INTRAVENOUS

## 2014-03-18 MED ORDER — SODIUM CHLORIDE 0.9 % IV SOLN
1.0000 g | Freq: Once | INTRAVENOUS | Status: AC
  Administered 2014-03-19: 1 g via INTRAVENOUS
  Filled 2014-03-18: qty 10

## 2014-03-18 MED ORDER — SODIUM CHLORIDE 0.9 % IV BOLUS (SEPSIS): 500.0000 mL | Freq: Once | INTRAVENOUS | Status: DC

## 2014-03-18 MED ORDER — INSULIN REGULAR BOLUS VIA INFUSION: 3.0000 [IU] | Freq: Once | INTRAVENOUS | Status: DC

## 2014-03-18 MED ORDER — DEXTROSE 50 % IV SOLN
50.0000 mL | Freq: Once | INTRAVENOUS | Status: AC
  Administered 2014-03-18: 50 mL via INTRAVENOUS

## 2014-03-18 MED ORDER — DEXTROSE 50 % IV SOLN
INTRAVENOUS | Status: AC
  Administered 2014-03-18: 50 mL via INTRAVENOUS
  Filled 2014-03-18: qty 50

## 2014-03-18 NOTE — ED Notes (Signed)
CRITICAL VALUE ALERT  Critical value received:  Potassium 7.0  Date of notification:  03/18/2014  Time of notification:  2229  Critical value read back:yes  Nurse who received alert:  Lilia ProA Dulcy Sida RN  MD notified (1st page):  Dr. Adriana Simasook  Time of first page: 2229  MD notified (2nd page):  Time of second page:  Responding MD: 2229  Time MD responded:  Dr. Adriana Simasook
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
## 2014-03-18 NOTE — ED Notes (Signed)
[facility] CBG 233 and pt in Afib which pt has hx per report

## 2014-03-18 NOTE — ED Notes (Signed)
Blood sugar checked. Result 191
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
## 2014-03-18 NOTE — ED Notes (Signed)
New order received for high potassium.
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
## 2014-03-19 ENCOUNTER — Encounter (HOSPITAL_COMMUNITY): Payer: Self-pay | Admitting: Internal Medicine

## 2014-03-19 ENCOUNTER — Inpatient Hospital Stay (HOSPITAL_COMMUNITY): Payer: Medicare Other

## 2014-03-19 DIAGNOSIS — N179 Acute kidney failure, unspecified: Secondary | ICD-10-CM | POA: Diagnosis present

## 2014-03-19 LAB — HEMOGLOBIN A1C
HEMOGLOBIN A1C: 10 % — AB (ref <5.7)
Mean Plasma Glucose: 240 mg/dL — ABNORMAL HIGH (ref <117)

## 2014-03-19 LAB — URINALYSIS, ROUTINE W REFLEX MICROSCOPIC
BILIRUBIN URINE: NEGATIVE
Glucose, UA: NEGATIVE mg/dL
KETONES UR: NEGATIVE mg/dL
Leukocytes, UA: NEGATIVE
NITRITE: NEGATIVE
PH: 5 (ref 5.0–8.0)
Protein, ur: 100 mg/dL — AB
Specific Gravity, Urine: >1.03 — ABNORMAL HIGH (ref 1.005–1.030)
Urobilinogen, UA: 0.2 mg/dL (ref 0.0–1.0)

## 2014-03-19 LAB — BASIC METABOLIC PANEL
BUN: 78 mg/dL — ABNORMAL HIGH (ref 6–23)
BUN: 77 mg/dL — ABNORMAL HIGH (ref 6–23)
BUN: 75 mg/dL — AB (ref 6–23)
CALCIUM: 9.6 mg/dL (ref 8.4–10.5)
CHLORIDE: 95 meq/L — AB (ref 96–112)
CO2: 18 mEq/L — ABNORMAL LOW (ref 19–32)
CO2: 18 mEq/L — ABNORMAL LOW (ref 19–32)
CO2: 17 mEq/L — ABNORMAL LOW (ref 19–32)
Calcium: 9.3 mg/dL (ref 8.4–10.5)
Calcium: 9.6 mg/dL (ref 8.4–10.5)
Chloride: 95 mEq/L — ABNORMAL LOW (ref 96–112)
Chloride: 96 mEq/L (ref 96–112)
Creatinine, Ser: 2.55 mg/dL — ABNORMAL HIGH (ref 0.50–1.35)
Creatinine, Ser: 2.53 mg/dL — ABNORMAL HIGH (ref 0.50–1.35)
Creatinine, Ser: 2.65 mg/dL — ABNORMAL HIGH (ref 0.50–1.35)
GFR calc Af Amer: 28 mL/min — ABNORMAL LOW (ref >90)
GFR calc Af Amer: 29 mL/min — ABNORMAL LOW (ref >90)
GFR calc Af Amer: 27 mL/min — ABNORMAL LOW (ref >90)
GFR calc non Af Amer: 24 mL/min — ABNORMAL LOW (ref >90)
GFR calc non Af Amer: 25 mL/min — ABNORMAL LOW (ref >90)
GFR, EST NON AFRICAN AMERICAN: 23 mL/min — AB (ref >90)
Glucose, Bld: 246 mg/dL — ABNORMAL HIGH (ref 70–99)
Glucose, Bld: 261 mg/dL — ABNORMAL HIGH (ref 70–99)
Glucose, Bld: 238 mg/dL — ABNORMAL HIGH (ref 70–99)
POTASSIUM: 6.8 meq/L — AB (ref 3.7–5.3)
POTASSIUM: 5.4 meq/L — AB (ref 3.7–5.3)
Potassium: 5.5 mEq/L — ABNORMAL HIGH (ref 3.7–5.3)
SODIUM: 129 meq/L — AB (ref 137–147)
SODIUM: 131 meq/L — AB (ref 137–147)
SODIUM: 132 meq/L — AB (ref 137–147)

## 2014-03-19 LAB — COMPREHENSIVE METABOLIC PANEL
ALT: 20 U/L (ref 0–53)
AST: 19 U/L (ref 0–37)
Albumin: 4.5 g/dL (ref 3.5–5.2)
Alkaline Phosphatase: 70 U/L (ref 39–117)
BUN: 77 mg/dL — ABNORMAL HIGH (ref 6–23)
CALCIUM: 9.2 mg/dL (ref 8.4–10.5)
CO2: 17 mEq/L — ABNORMAL LOW (ref 19–32)
CREATININE: 2.59 mg/dL — AB (ref 0.50–1.35)
Chloride: 95 mEq/L — ABNORMAL LOW (ref 96–112)
GFR calc non Af Amer: 24 mL/min — ABNORMAL LOW (ref >90)
GFR, EST AFRICAN AMERICAN: 28 mL/min — AB (ref >90)
GLUCOSE: 253 mg/dL — AB (ref 70–99)
Potassium: 7 mEq/L (ref 3.7–5.3)
Sodium: 128 mEq/L — ABNORMAL LOW (ref 137–147)
Total Bilirubin: 0.9 mg/dL (ref 0.3–1.2)
Total Protein: 8.5 g/dL — ABNORMAL HIGH (ref 6.0–8.3)

## 2014-03-19 LAB — URINE MICROSCOPIC-ADD ON

## 2014-03-19 LAB — CBC
HCT: 43.2 % (ref 39.0–52.0)
HEMOGLOBIN: 15.3 g/dL (ref 13.0–17.0)
MCH: 31.5 pg (ref 26.0–34.0)
MCHC: 35.4 g/dL (ref 30.0–36.0)
MCV: 88.9 fL (ref 78.0–100.0)
Platelets: 164 10*3/uL (ref 150–400)
RBC: 4.86 MIL/uL (ref 4.22–5.81)
RDW: 15.1 % (ref 11.5–15.5)
WBC: 11.8 10*3/uL — ABNORMAL HIGH (ref 4.0–10.5)

## 2014-03-19 LAB — TROPONIN I
Troponin I: <0.3 ng/mL (ref <0.30)
Troponin I: <0.3 ng/mL (ref <0.30)

## 2014-03-19 LAB — MRSA PCR SCREENING: MRSA by PCR: NEGATIVE

## 2014-03-19 LAB — DIGOXIN LEVEL: DIGOXIN LVL: 1.2 ng/mL (ref 0.8–2.0)

## 2014-03-19 LAB — GLUCOSE, CAPILLARY
GLUCOSE-CAPILLARY: 144 mg/dL — AB (ref 70–99)
Glucose-Capillary: 216 mg/dL — ABNORMAL HIGH (ref 70–99)
Glucose-Capillary: 186 mg/dL — ABNORMAL HIGH (ref 70–99)
Glucose-Capillary: 268 mg/dL — ABNORMAL HIGH (ref 70–99)

## 2014-03-19 MED ORDER — SODIUM POLYSTYRENE SULFONATE 15 GM/60ML PO SUSP
60.0000 g | ORAL | Status: AC
  Administered 2014-03-19: 60 g via ORAL
  Filled 2014-03-19: qty 240

## 2014-03-19 MED ORDER — INSULIN ASPART 100 UNIT/ML ~~LOC~~ SOLN
0.0000 [IU] | Freq: Three times a day (TID) | SUBCUTANEOUS | Status: DC
  Administered 2014-03-19: 1 [IU] via SUBCUTANEOUS
  Administered 2014-03-19: 3 [IU] via SUBCUTANEOUS
  Administered 2014-03-19: 2 [IU] via SUBCUTANEOUS
  Administered 2014-03-20: 5 [IU] via SUBCUTANEOUS
  Administered 2014-03-20: 3 [IU] via SUBCUTANEOUS
  Administered 2014-03-20 – 2014-03-21 (×2): 5 [IU] via SUBCUTANEOUS
  Administered 2014-03-21: 3 [IU] via SUBCUTANEOUS
  Administered 2014-03-21: 2 [IU] via SUBCUTANEOUS
  Administered 2014-03-22: 1 [IU] via SUBCUTANEOUS

## 2014-03-19 MED ORDER — SODIUM CHLORIDE 0.9 % IJ SOLN
3.0000 mL | Freq: Two times a day (BID) | INTRAMUSCULAR | Status: DC
Start: 2014-03-19 — End: 2014-03-22
  Administered 2014-03-19 – 2014-03-20 (×2): 3 mL via INTRAVENOUS

## 2014-03-19 MED ORDER — SODIUM CHLORIDE 0.9 % IV SOLN
INTRAVENOUS | Status: AC
  Administered 2014-03-19: 13:00:00 via INTRAVENOUS

## 2014-03-19 MED ORDER — ONDANSETRON HCL 4 MG PO TABS: 4.0000 mg | ORAL_TABLET | Freq: Four times a day (QID) | ORAL | Status: DC | PRN

## 2014-03-19 MED ORDER — DABIGATRAN ETEXILATE MESYLATE 75 MG PO CAPS
75.0000 mg | ORAL_CAPSULE | Freq: Two times a day (BID) | ORAL | Status: DC
  Administered 2014-03-19 (×2): 75 mg via ORAL
  Filled 2014-03-19 (×8): qty 1

## 2014-03-19 MED ORDER — ACETAMINOPHEN 650 MG RE SUPP
650.0000 mg | Freq: Four times a day (QID) | RECTAL | Status: DC | PRN
Start: 2014-03-19 — End: 2014-03-22

## 2014-03-19 MED ORDER — ONDANSETRON HCL 4 MG/2ML IJ SOLN: 4.0000 mg | Freq: Four times a day (QID) | INTRAMUSCULAR | Status: DC | PRN

## 2014-03-19 MED ORDER — ALBUTEROL SULFATE (2.5 MG/3ML) 0.083% IN NEBU: 2.5000 mg | INHALATION_SOLUTION | RESPIRATORY_TRACT | Status: DC | PRN

## 2014-03-19 MED ORDER — PANTOPRAZOLE SODIUM 40 MG PO TBEC
40.0000 mg | DELAYED_RELEASE_TABLET | Freq: Every day | ORAL | Status: DC
  Administered 2014-03-19 – 2014-03-22 (×4): 40 mg via ORAL
  Filled 2014-03-19 (×4): qty 1

## 2014-03-19 MED ORDER — ACETAMINOPHEN 325 MG PO TABS: 650.0000 mg | ORAL_TABLET | Freq: Four times a day (QID) | ORAL | Status: DC | PRN

## 2014-03-19 MED ORDER — CARVEDILOL 12.5 MG PO TABS
25.0000 mg | ORAL_TABLET | Freq: Two times a day (BID) | ORAL | Status: DC
  Administered 2014-03-19 – 2014-03-22 (×7): 25 mg via ORAL
  Filled 2014-03-19 (×7): qty 2

## 2014-03-19 MED ORDER — ISOSORBIDE MONONITRATE ER 60 MG PO TB24
30.0000 mg | ORAL_TABLET | Freq: Every day | ORAL | Status: DC
  Administered 2014-03-19 – 2014-03-22 (×4): 30 mg via ORAL
  Filled 2014-03-19 (×4): qty 1

## 2014-03-19 MED ORDER — SODIUM CHLORIDE 0.9 % IV SOLN
INTRAVENOUS | Status: DC
Start: 2014-03-19 — End: 2014-03-22
  Administered 2014-03-19: 300 mL via INTRAVENOUS
  Administered 2014-03-20: 1000 mL via INTRAVENOUS
  Administered 2014-03-21 (×2): via INTRAVENOUS

## 2014-03-19 MED ORDER — HYDROCODONE-ACETAMINOPHEN 5-325 MG PO TABS: 1.0000 | ORAL_TABLET | ORAL | Status: DC | PRN
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
## 2014-03-19 NOTE — Progress Notes (Signed)
ANTICOAGULATION CONSULT NOTE - Initial Consult  Pharmacy Consult for Pradaxa Indication: atrial fibrillation  No Known Allergies  Patient Measurements: Height: 5' 9.5" (176.5 cm) Weight: 159 lb 2.8 oz (72.2 kg) IBW/kg (Calculated) : 71.85  Vital Signs: Temp: 98.2 F (36.8 C) (03/29 0400) Temp src: Oral (03/29 0400) BP: 128/61 mmHg (03/29 0630) Pulse Rate: 99 (03/29 0630)  Labs:  Recent Labs  03/18/14 2134 03/18/14 2244 03/19/14 0408 03/19/14 0657  HGB 15.8  --  15.3  --   HCT 44.8  --  43.2  --   PLT 175  --  164  --   CREATININE 2.59* 2.55* 2.53* 2.65*  TROPONINI  --  <0.30 <0.30  --     Estimated Creatinine Clearance: 27.5 ml/min (by C-G formula based on Cr of 2.65).   Medical History: Past Medical History  Diagnosis Date  . Coronary atherosclerosis of native coronary artery     Ant. MI in 4/95; PTCA of 90% prox & distal LAD unsuccessful-->  Urgent CABG-4/95 TO Cx; 50% RCA; ant. HK; EF of 50-55%; 08/2008: 3-V disease plus a  95% LIMA stenosis, treated with DES; occlusion of SVG to CX; RCA SVG stenosis--> PCI with DES  2010: in-stent restenosis in the LIMA-->cutting balloon; EF of 35-40%;  07/2010: TO of SVG to     RCA-medical therapy advised; EF of 30-35% by echo in 12/2010  . Atrial fibrillation 1995    1995, 2009; bradycardia with beta blocker therapy  . Tobacco abuse     40-50 pack years; currently one pack per day  . Peripheral vascular disease 1995    Abdominal aortic obstruction 1995-->vascular surgery  . Hyperlipidemia   . Type 2 diabetes mellitus   . DDD (degenerative disc disease)     Discectomy and fusion at L4 and L5  . Alcohol use 1999    Excessive alcohol use- quit in 1999  . Chronic systolic heart failure     LVEF 25-30% 2011  . Non-compliance     Medications:  Prescriptions prior to admission  Medication Sig Dispense Refill  . carvedilol (COREG) 25 MG tablet TAKE ONE TABLET TWICE DAILY  60 tablet  3  . CRESTOR 40 MG tablet TAKE ONE (1)  TABLET BY MOUTH EVERY DAY  30 tablet  5  . digoxin (LANOXIN) 0.125 MG tablet Take 125 mcg by mouth daily.        . furosemide (LASIX) 40 MG tablet Take 40 mg every other day.  30 tablet  3  . glimepiride (AMARYL) 2 MG tablet Take 1 tablet (2 mg total) by mouth daily before breakfast.  90 tablet  3  . hydrocodone-acetaminophen (LORCET-HD) 5-500 MG per capsule Take 1 capsule by mouth every 6 (six) hours as needed for pain.       . isosorbide mononitrate (IMDUR) 30 MG 24 hr tablet Take 1 tablet (30 mg total) by mouth daily.  30 tablet  5  . lisinopril (PRINIVIL,ZESTRIL) 5 MG tablet Take 1 tablet (5 mg total) by mouth daily.  90 tablet  3  . metFORMIN (GLUCOPHAGE) 1000 MG tablet TAKE ONE TABLET TWICE DAILY  60 tablet  6  . nitroGLYCERIN (NITROSTAT) 0.4 MG SL tablet Place 1 tablet (0.4 mg total) under the tongue every 5 (five) minutes as needed.  25 tablet  6  . pantoprazole (PROTONIX) 40 MG tablet Take 1 tablet (40 mg total) by mouth daily.  30 tablet  6  . potassium chloride (K-DUR) 10 MEQ tablet TAKE ONE (  1) TABLET BY MOUTH EVERY DAY  30 tablet  6  . potassium chloride SA (K-DUR,KLOR-CON) 20 MEQ tablet Take 1 tablet (20 mEq total) by mouth 2 (two) times daily.  60 tablet  6  . PRADAXA 150 MG CAPS TAKE ONE CAPSULE BY MOUTH TWICE A DAY  60 capsule  3  . spironolactone (ALDACTONE) 25 MG tablet Take 25 mg by mouth daily.         Assessment: 68yo male with h/o afib.  Asked to adjust Pradaxa for renal insufficiency.  Estimated Creatinine Clearance: 27.5 ml/min (by C-G formula based on Cr of 2.65).  Goal of Therapy:  Pradaxa for nonvalvular atrial fibrillation Monitor platelets by anticoagulation protocol: Yes   Plan:  Pradaxa 75mg  PO q12hrs (renally adjusted) Monitor renal fxn and CBC  Denielle Bayard A 03/19/2014,7:46 AM

## 2014-03-20 ENCOUNTER — Inpatient Hospital Stay (HOSPITAL_COMMUNITY)
Admit: 2014-03-20 | Discharge: 2014-03-20 | Disposition: A | Discharge status: Home / Self Care | Payer: Medicare Other | Attending: Internal Medicine | Admitting: Internal Medicine

## 2014-03-20 LAB — COMPREHENSIVE METABOLIC PANEL
ALBUMIN: 3.7 g/dL (ref 3.5–5.2)
ALT: 16 U/L (ref 0–53)
AST: 19 U/L (ref 0–37)
Alkaline Phosphatase: 63 U/L (ref 39–117)
BUN: 57 mg/dL — ABNORMAL HIGH (ref 6–23)
CALCIUM: 8.9 mg/dL (ref 8.4–10.5)
CO2: 19 mEq/L (ref 19–32)
CREATININE: 1.8 mg/dL — AB (ref 0.50–1.35)
Chloride: 104 mEq/L (ref 96–112)
GFR calc Af Amer: 43 mL/min — ABNORMAL LOW (ref >90)
GFR calc non Af Amer: 37 mL/min — ABNORMAL LOW (ref >90)
Glucose, Bld: 249 mg/dL — ABNORMAL HIGH (ref 70–99)
Potassium: 4.9 mEq/L (ref 3.7–5.3)
Sodium: 136 mEq/L — ABNORMAL LOW (ref 137–147)
Total Bilirubin: 0.6 mg/dL (ref 0.3–1.2)
Total Protein: 7.2 g/dL (ref 6.0–8.3)

## 2014-03-20 LAB — URINALYSIS, ROUTINE W REFLEX MICROSCOPIC
BILIRUBIN URINE: NEGATIVE
GLUCOSE, UA: 500 mg/dL — AB
HGB URINE DIPSTICK: NEGATIVE
Ketones, ur: NEGATIVE mg/dL
Leukocytes, UA: NEGATIVE
Nitrite: NEGATIVE
Specific Gravity, Urine: 1.01 (ref 1.005–1.030)
UROBILINOGEN UA: 0.2 mg/dL (ref 0.0–1.0)
pH: 5.5 (ref 5.0–8.0)

## 2014-03-20 LAB — GLUCOSE, CAPILLARY
GLUCOSE-CAPILLARY: 225 mg/dL — AB (ref 70–99)
Glucose-Capillary: 294 mg/dL — ABNORMAL HIGH (ref 70–99)

## 2014-03-20 LAB — URINE MICROSCOPIC-ADD ON

## 2014-03-20 MED ORDER — DABIGATRAN ETEXILATE MESYLATE 150 MG PO CAPS
150.0000 mg | ORAL_CAPSULE | Freq: Once | ORAL | Status: AC
  Administered 2014-03-20: 150 mg via ORAL
  Filled 2014-03-20: qty 1

## 2014-03-20 MED ORDER — DABIGATRAN ETEXILATE MESYLATE 150 MG PO CAPS
150.0000 mg | ORAL_CAPSULE | Freq: Two times a day (BID) | ORAL | Status: DC
  Administered 2014-03-20 – 2014-03-22 (×4): 150 mg via ORAL
  Filled 2014-03-20 (×8): qty 1

## 2014-03-20 NOTE — Progress Notes (Signed)
EEG completed; results pending.    

## 2014-03-20 NOTE — Progress Notes (Signed)
UR chart review completed.  

## 2014-03-20 NOTE — Care Management Note (Addendum)
    Page 1 of 1   03/22/2014     10:26:43 AM   CARE MANAGEMENT NOTE 03/22/2014  Patient:  [name],[name]   Account Number:  [id number]  Date Initiated:  03/20/2014  Documentation initiated by:  Sharrie RothmanBLACKWELL,Nas Wafer C  Subjective/Objective Assessment:   Pt admitted from home with AKI and hyerpkalemia. Pt lives with his wife and will return home at discharge. Pt lives with his wife and will return home at discharge. Pt is fairly independent with ADL's.     Action/Plan:   Pt not very interactive with conversation. WIll only answer yes or no to questions. Will continue to follow for discharge planning needs. Has used [facility] in the past.   Anticipated DC Date:  03/23/2014   Anticipated DC Plan:  HOME/SELF CARE      DC Planning Services  CM consult      Choice offered to / List presented to:             Status of service:  Completed, signed off Medicare Important Message given?  YES (If response is "NO", the following Medicare IM given date fields will be blank) Date Medicare IM given:  03/22/2014 Date Additional Medicare IM given:    Discharge Disposition:  HOME/SELF CARE  Per UR Regulation:    If discussed at Long Length of Stay Meetings, dates discussed:    Comments:  03/22/14 1025 Arlyss Queenammy Chai Routh, RN BSN CM Pt discharged home today with insuling (which is new). Pt stated that he knows how to check his sugar but has never done insulin. [facility] but pt adamantly refused and said "I don't need no nurse". Pts Rn aware of discharge arrangements.  03/20/14 1500 Arlyss Queenammy Cathyann Kilfoyle, RN BSN CM
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
## 2014-03-20 NOTE — Progress Notes (Signed)
ANTICOAGULATION CONSULT NOTE - follow up  Pharmacy Consult for Pradaxa Indication: atrial fibrillation  No Known Allergies  Patient Measurements: Height: 5' 9.5" (176.5 cm) Weight: 160 lb 4.4 oz (72.7 kg) IBW/kg (Calculated) : 71.85  Vital Signs: Temp: 97.6 F (36.4 C) (03/30 0733) Temp src: Axillary (03/30 0733) BP: 130/63 mmHg (03/30 1000)  Labs:  Recent Labs  03/18/14 2134  03/19/14 0408 03/19/14 0657 03/19/14 1006 03/19/14 1625 03/20/14 0552  HGB 15.8  --  15.3  --   --   --   --   HCT 44.8  --  43.2  --   --   --   --   PLT 175  --  164  --   --   --   --   CREATININE 2.59*  < > 2.53* 2.65*  --   --  1.80*  TROPONINI  --   < > <0.30  --  <0.30 <0.30  --   < > = values in this interval not displayed.  Estimated Creatinine Clearance: 40.5 ml/min (by C-G formula based on Cr of 1.8).  Medical History: Past Medical History  Diagnosis Date  . Coronary atherosclerosis of native coronary artery     Ant. MI in 4/95; PTCA of 90% prox & distal LAD unsuccessful-->  Urgent CABG-4/95 TO Cx; 50% RCA; ant. HK; EF of 50-55%; 08/2008: 3-V disease plus a  95% LIMA stenosis, treated with DES; occlusion of SVG to CX; RCA SVG stenosis--> PCI with DES  2010: in-stent restenosis in the LIMA-->cutting balloon; EF of 35-40%;  07/2010: TO of SVG to     RCA-medical therapy advised; EF of 30-35% by echo in 12/2010  . Atrial fibrillation 1995    1995, 2009; bradycardia with beta blocker therapy  . Tobacco abuse     40-50 pack years; currently one pack per day  . Peripheral vascular disease 1995    Abdominal aortic obstruction 1995-->vascular surgery  . Hyperlipidemia   . Type 2 diabetes mellitus   . DDD (degenerative disc disease)     Discectomy and fusion at L4 and L5  . Alcohol use 1999    Excessive alcohol use- quit in 1999  . Chronic systolic heart failure     LVEF 25-30% 2011  . Non-compliance    Medications:  Prescriptions prior to admission  Medication Sig Dispense Refill  .  acetaminophen-codeine (TYLENOL #2) 300-15 MG per tablet Take 1 tablet by mouth every 4 (four) hours as needed for moderate pain.      . carvedilol (COREG) 25 MG tablet TAKE ONE TABLET TWICE DAILY  60 tablet  3  . CRESTOR 40 MG tablet TAKE ONE (1) TABLET BY MOUTH EVERY DAY  30 tablet  5  . digoxin (LANOXIN) 0.125 MG tablet Take 125 mcg by mouth daily.        . furosemide (LASIX) 40 MG tablet Take 40 mg every other day.  30 tablet  3  . glimepiride (AMARYL) 2 MG tablet Take 1 tablet (2 mg total) by mouth daily before breakfast.  90 tablet  3  . hydrocodone-acetaminophen (LORCET-HD) 5-500 MG per capsule Take 1 capsule by mouth every 6 (six) hours as needed for pain.       . isosorbide mononitrate (IMDUR) 30 MG 24 hr tablet Take 1 tablet (30 mg total) by mouth daily.  30 tablet  5  . lisinopril (PRINIVIL,ZESTRIL) 5 MG tablet Take 1 tablet (5 mg total) by mouth daily.  90 tablet  3  .  metFORMIN (GLUCOPHAGE) 1000 MG tablet TAKE ONE TABLET TWICE DAILY  60 tablet  6  . pantoprazole (PROTONIX) 40 MG tablet Take 1 tablet (40 mg total) by mouth daily.  30 tablet  6  . potassium chloride SA (K-DUR,KLOR-CON) 20 MEQ tablet Take 1 tablet (20 mEq total) by mouth 2 (two) times daily.  60 tablet  6  . PRADAXA 150 MG CAPS TAKE ONE CAPSULE BY MOUTH TWICE A DAY  60 capsule  3  . spironolactone (ALDACTONE) 25 MG tablet Take 25 mg by mouth daily.        . nitroGLYCERIN (NITROSTAT) 0.4 MG SL tablet Place 1 tablet (0.4 mg total) under the tongue every 5 (five) minutes as needed.  25 tablet  6   Assessment: 68yo male with h/o afib.  Asked to adjust Pradaxa for renal insufficiency.  SCr is improving, down from 2.65.    Estimated Creatinine Clearance: 40.5 ml/min (by C-G formula based on Cr of 1.8).  Goal of Therapy:  Pradaxa for nonvalvular atrial fibrillation Monitor platelets by anticoagulation protocol: Yes   Plan:  Increase Pradaxa 150mg  PO q12hrs (for ClCr > 30) Monitor renal fxn and CBC  Arabel Barcenas  A 03/20/2014,10:56 AM

## 2014-03-20 NOTE — Progress Notes (Signed)
Inpatient Diabetes Program Recommendations  AACE/ADA: New Consensus Statement on Inpatient Glycemic Control (2013)  Target Ranges:  Prepandial:   less than 140 mg/dL      Peak postprandial:   less than 180 mg/dL (1-2 hours)      Critically ill patients:  140 - 180 mg/dL   Results for [name], Payne F (MRN [id number]) as of 03/20/2014 07:37  Ref. Range 03/19/2014 07:09 03/19/2014 11:40 03/19/2014 16:31 03/19/2014 22:20 03/20/2014 07:23  Glucose-Capillary Latest Range: 70-99 mg/dL 409216 (H) 811186 (H) 914144 (H) 268 (H) 225 (H)   Diabetes history: DM2 Outpatient Diabetes medications: Amaryl 2 mg BID, Metformin 1000 mg BID Current orders for Inpatient glycemic control: Novolog 0-9 units AC  Inpatient Diabetes Program Recommendations Correction (SSI): Please increase Novolog correction to moderate scale and add Novolog bedtime correction scale.  Thanks, Orlando PennerMarie Kynzleigh Bandel, RN, MSN, CCRN Diabetes Coordinator Inpatient Diabetes Program [phone] (Team Pager) [phone] ([facility] office) [phone] [facility] office)
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
## 2014-03-20 NOTE — Progress Notes (Signed)
[facility] [id number].  Verbalized understanding.  EEG in progress at this time.  Pt to be transferred to floor after EEG complete. Schonewitz, Candelaria StagersLeigh Anne 03/20/2014

## 2014-03-21 LAB — GLUCOSE, CAPILLARY
GLUCOSE-CAPILLARY: 270 mg/dL — AB (ref 70–99)
GLUCOSE-CAPILLARY: 292 mg/dL — AB (ref 70–99)
Glucose-Capillary: 202 mg/dL — ABNORMAL HIGH (ref 70–99)
Glucose-Capillary: 169 mg/dL — ABNORMAL HIGH (ref 70–99)
Glucose-Capillary: 220 mg/dL — ABNORMAL HIGH (ref 70–99)
Glucose-Capillary: 248 mg/dL — ABNORMAL HIGH (ref 70–99)

## 2014-03-21 LAB — BASIC METABOLIC PANEL
BUN: 33 mg/dL — AB (ref 6–23)
CALCIUM: 8.6 mg/dL (ref 8.4–10.5)
CO2: 18 mEq/L — ABNORMAL LOW (ref 19–32)
CREATININE: 1.23 mg/dL (ref 0.50–1.35)
Chloride: 107 mEq/L (ref 96–112)
GFR calc Af Amer: 68 mL/min — ABNORMAL LOW (ref >90)
GFR, EST NON AFRICAN AMERICAN: 59 mL/min — AB (ref >90)
GLUCOSE: 159 mg/dL — AB (ref 70–99)
Potassium: 4 mEq/L (ref 3.7–5.3)
Sodium: 138 mEq/L (ref 137–147)

## 2014-03-21 LAB — PHOSPHORUS: Phosphorus: 2.9 mg/dL (ref 2.3–4.6)

## 2014-03-21 MED ORDER — INSULIN DETEMIR 100 UNIT/ML ~~LOC~~ SOLN
10.0000 [IU] | Freq: Every day | SUBCUTANEOUS | Status: DC
  Administered 2014-03-21: 10 [IU] via SUBCUTANEOUS
  Filled 2014-03-21: qty 0.1

## 2014-03-21 MED ORDER — LISINOPRIL 5 MG PO TABS
5.0000 mg | ORAL_TABLET | Freq: Every day | ORAL | Status: DC
  Administered 2014-03-21 – 2014-03-22 (×2): 5 mg via ORAL
  Filled 2014-03-21 (×2): qty 1

## 2014-03-21 NOTE — Progress Notes (Signed)
Inpatient Diabetes Program Recommendations  AACE/ADA: New Consensus Statement on Inpatient Glycemic Control (2013)  Target Ranges:  Prepandial:   less than 140 mg/dL      Peak postprandial:   less than 180 mg/dL (1-2 hours)      Critically ill patients:  140 - 180 mg/dL   Results for [name], Tymel F (MRN [id number]) as of 03/21/2014 08:52  Ref. Range 03/19/2014 04:08  Hemoglobin A1C Latest Range: <5.7 % 10.0 (H)   Results for [name], [name] (MRN [id number]) as of 03/21/2014 08:52  Ref. Range 03/19/2014 07:09 03/19/2014 11:40 03/19/2014 16:31 03/19/2014 22:20 03/20/2014 07:23 03/20/2014 11:44  Glucose-Capillary Latest Range: 70-99 mg/dL 782216 (H) 956186 (H) 213144 (H) 268 (H) 225 (H) 294 (H)   Diabetes history: DM2  Outpatient Diabetes medications: Amaryl 2 mg BID, Metformin 1000 mg BID  Current orders for Inpatient glycemic control: Novolog 0-9 units AC  Inpatient Diabetes Program Recommendations Insulin - Basal: Please consider ordering low dose basal insulin; recommend starting with Levemir 7 units Q24H (starting now). Correction (SSI): Please increase Novolog correction to moderate scale and add Novolog bedtime correction scale. HgbA1C: Noted A1C 10.0% on 03/19/14.  Thanks, Orlando PennerMarie Dagon Budai, RN, MSN, CCRN Diabetes Coordinator Inpatient Diabetes Program [phone] (Team Pager) [phone] ([facility] office) [phone] [facility] office)
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
## 2014-03-21 NOTE — Progress Notes (Signed)
Subjective: Interval History: has no complaint of nausea or vomiting. Presently patient denies any difficulty in breathing. Patient overall seems to be feeling better..  Objective: Vital signs in last 24 hours: Temp:  [97.4 F (36.3 C)-97.6 F (36.4 C)] 97.6 F (36.4 C) (03/31 0536) Pulse Rate:  [71-79] 71 (03/31 0536) Resp:  [15-20] 20 (03/31 0536) BP: (102-130)/(58-75) 130/75 mmHg (03/31 0536) SpO2:  [96 %-100 %] 100 % (03/31 0536) Weight change:   Intake/Output from previous day: 03/30 0701 - 03/31 0700 In: 2017.5 [P.O.:480; I.V.:1537.5] Out: 1000 [Urine:1000] Intake/Output this shift:    General appearance: alert, cooperative and no distress Resp: clear to auscultation bilaterally Cardio: regular rate and rhythm, S1, S2 normal, no murmur, click, rub or gallop GI: soft, non-tender; bowel sounds normal; no masses,  no organomegaly Extremities: extremities normal, atraumatic, no cyanosis or edema  Lab Results:  Recent Labs  03/18/14 2134 03/19/14 0408  WBC 13.5* 11.8*  HGB 15.8 15.3  HCT 44.8 43.2  PLT 175 164   BMET:  Recent Labs  03/20/14 0552 03/21/14 0502  NA 136* 138  K 4.9 4.0  CL 104 107  CO2 19 18*  GLUCOSE 249* 159*  BUN 57* 33*  CREATININE 1.80* 1.23  CALCIUM 8.9 8.6   No results found for this basename: PTH,  in the last 72 hours Iron Studies: No results found for this basename: IRON, TIBC, TRANSFERRIN, FERRITIN,  in the last 72 hours  Studies/Results: [place] Renal  03/19/2014   CLINICAL DATA:  68 year old male with renal failure. Initial encounter.  EXAM: RENAL/URINARY TRACT ULTRASOUND COMPLETE  COMPARISON:  None.  FINDINGS: Right Kidney:  Length: 11.3 cm. Echogenicity within normal limits. No mass or hydronephrosis visualized.  Left Kidney:  Length: 11.9 cm. Echogenicity within normal limits. No mass or hydronephrosis visualized. Mild 4 mm echogenic focus in the sinus near the medullary pyramid at the midpole (image 26).  Bladder:  Appears normal  for degree of bladder distention.  IMPRESSION: 1. No hydronephrosis or acute renal findings. 2. 4 mm nonobstructing left nephrolithiasis versus calcified renal artery plaque.   Electronically Signed   By: Augusto GambleLee  Hall M.D.   On: 03/19/2014 11:32    I have reviewed the patient's current medications.  Assessment/Plan: Problem #1 acute kidney injury: His BUN is 33 and creatinine is 1.23 renal function seems to be improving. Etiology was thought to be possibly secondary to prerenal syndrome/ATN/ACE inhibitor. Problem #2 hyperkalemia potassium is 4 and this corrected. The high potassium was to be secondary to potassium supplement/Prinivil/Aldactone. Problem #3 diabetes Problem #4 history of hypertension: His blood pressure is reasonably controlled Problem #5 peripheral vascular disease Problem #6 metabolic bone disease: His calcium and phosphorus isn't range Problem #6 history of arthrosclerotic cardiovascular disease. Plan: We'll decrease his IV fluid to 50 cc per hour We will restart patient on Prinivil 5 mg by mouth daily We'll check patient presently is awake panel in the morning.    LOS: 3 days   Marlin Jarrard S 03/21/2014,7:21 AM

## 2014-03-22 LAB — GLUCOSE, CAPILLARY: GLUCOSE-CAPILLARY: 135 mg/dL — AB (ref 70–99)

## 2014-03-22 LAB — BASIC METABOLIC PANEL
BUN: 23 mg/dL (ref 6–23)
CO2: 20 mEq/L (ref 19–32)
CREATININE: 1.2 mg/dL (ref 0.50–1.35)
Calcium: 8.9 mg/dL (ref 8.4–10.5)
Chloride: 110 mEq/L (ref 96–112)
GFR, EST AFRICAN AMERICAN: 71 mL/min — AB (ref >90)
GFR, EST NON AFRICAN AMERICAN: 61 mL/min — AB (ref >90)
Glucose, Bld: 157 mg/dL — ABNORMAL HIGH (ref 70–99)
Potassium: 4.1 mEq/L (ref 3.7–5.3)
Sodium: 141 mEq/L (ref 137–147)

## 2014-03-22 MED ORDER — INSULIN PEN STARTER KIT
1.0000 | Freq: Once | Status: AC
  Administered 2014-03-22: 1

## 2014-03-22 MED ORDER — INSULIN PEN STARTER KIT
1.0000 | Freq: Once | Status: DC
  Filled 2014-03-22: qty 1
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
## 2014-03-22 NOTE — Progress Notes (Signed)
Noted patient will be discharged today.  According to discharge summary by Dr. Everette Rank, pt will be started on Levemir and Novolog and will stop Amaryl and Metformin.  Patient has not received any education on insulin administration.  Diabetes Coordinator is not on [place] today and will not be able to come see patient before discharge.  Quentin Mulling, RN on Dept 300 to let her know patient was being discharged on Levemir and Novolog and will need education on insulin administration before being discharged today. Placed order for insulin pen starter kit, diabetes education videos, and RD consult. Also requested Tammy, RN, CM see about a home health nurse coming out to reinforce insulin teaching and compliance.  Please page Diabetes Coordinator for any questions or concerns.  Thanks, Barnie Alderman, RN, MSN, CCRN Diabetes Coordinator Inpatient Diabetes Program [phone] (Team Pager) [phone] (AP office) [phone] [facility] office)
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
## 2014-03-22 NOTE — Progress Notes (Signed)
Patient states understanding of discharge but is not willing to watch but one video, handouts and insulin kit given. Patient insists that he knows what to do and becomes agitated when further teaching attempted.

## 2014-03-26 NOTE — Procedures (Signed)
  [facility] Latavious Bitter A. Gerilyn Pilgrimoonquah, MD     [email or web address]           HISTORY: This is a 68 year old man who presents with spells suspicious for seizure activity.  MEDICATIONS: Scheduled Meds: Continuous Infusions: PRN Meds:.    Prior to Admission medications   Medication Sig Start Date End Date Taking? Authorizing Provider  acetaminophen-codeine (TYLENOL #2) 300-15 MG per tablet Take 1 tablet by mouth every 4 (four) hours as needed for moderate pain.    Historical Provider, MD  carvedilol (COREG) 25 MG tablet TAKE ONE TABLET TWICE DAILY    Antoine PocheJonathan F Branch, MD  CRESTOR 40 MG tablet TAKE ONE (1) TABLET BY MOUTH EVERY DAY 05/20/13   Kathlen Brunswickobert M Rothbart, MD  digoxin (LANOXIN) 0.125 MG tablet Take 125 mcg by mouth daily.      Historical Provider, MD  furosemide (LASIX) 40 MG tablet Take 40 mg every other day. 01/30/14   Antoine PocheJonathan F Branch, MD  hydrocodone-acetaminophen (LORCET-HD) 5-500 MG per capsule Take 1 capsule by mouth every 6 (six) hours as needed for pain.     Historical Provider, MD  isosorbide mononitrate (IMDUR) 30 MG 24 hr tablet Take 1 tablet (30 mg total) by mouth daily. 09/08/13   Angus Edilia BoG [name], MD  lisinopril (PRINIVIL,ZESTRIL) 5 MG tablet Take 1 tablet (5 mg total) by mouth daily. 02/28/14   Antoine PocheJonathan F Branch, MD  nitroGLYCERIN (NITROSTAT) 0.4 MG SL tablet Place 1 tablet (0.4 mg total) under the tongue every 5 (five) minutes as needed. 05/24/12   Kathlen Brunswickobert M Rothbart, MD  pantoprazole (PROTONIX) 40 MG tablet Take 1 tablet (40 mg total) by mouth daily. 12/16/13   Thereasa ParkinKathryn Stern, PA-C  potassium chloride SA (K-DUR,KLOR-CON) 20 MEQ tablet Take 1 tablet (20 mEq total) by mouth 2 (two) times daily. 12/27/13   Jodelle GrossKathryn M Lawrence, NP  PRADAXA 150 MG CAPS TAKE ONE CAPSULE BY MOUTH TWICE A DAY 10/29/12   Kathlen Brunswickobert M Rothbart, MD  spironolactone (ALDACTONE) 25 MG tablet Take 25 mg by mouth daily.      Historical Provider, MD      ANALYSIS: A 16 channel recording using standard 10 20  measurements is conducted for 21 minutes. There is a background activity that gets as high as 8-8 1/2 hertz bilaterally. Awake and sleep activities are recorded. K complexes and sleep spindles are observed. Photic simulation is carried out without significant changes in the background activity. Hyperventilation is not carried out. There is no focal or lateralized slowing. There is no epileptiform activity observed.   IMPRESSION: 1. This is a normal recording of the awake and sleep states.      Virtie Bungert A. Gerilyn Pilgrimoonquah, M.D.  Diplomate, Biomedical engineerAmerican Board of Psychiatry and Neurology ( Neurology).

## 2014-03-27 ENCOUNTER — Encounter (INDEPENDENT_AMBULATORY_CARE_PROVIDER_SITE_OTHER): Payer: Self-pay | Admitting: *Deleted

## 2014-04-05 ENCOUNTER — Encounter (HOSPITAL_COMMUNITY): Payer: Self-pay | Admitting: Emergency Medicine

## 2014-04-05 ENCOUNTER — Inpatient Hospital Stay (HOSPITAL_COMMUNITY): Payer: Medicare Other

## 2014-04-05 ENCOUNTER — Inpatient Hospital Stay (HOSPITAL_COMMUNITY)
Admission: EM | Admit: 2014-04-05 | Discharge: 2014-04-10 | DRG: 638 | Disposition: A | Discharge status: Home / Self Care | Payer: Medicare Other | Attending: Family Medicine | Admitting: Family Medicine

## 2014-04-05 DIAGNOSIS — I4891 Unspecified atrial fibrillation: Secondary | ICD-10-CM | POA: Diagnosis present

## 2014-04-05 DIAGNOSIS — Z9861 Coronary angioplasty status: Secondary | ICD-10-CM

## 2014-04-05 DIAGNOSIS — E1110 Type 2 diabetes mellitus with ketoacidosis without coma: Secondary | ICD-10-CM | POA: Diagnosis present

## 2014-04-05 DIAGNOSIS — I509 Heart failure, unspecified: Secondary | ICD-10-CM | POA: Diagnosis present

## 2014-04-05 DIAGNOSIS — Z79899 Other long term (current) drug therapy: Secondary | ICD-10-CM

## 2014-04-05 DIAGNOSIS — I2510 Atherosclerotic heart disease of native coronary artery without angina pectoris: Secondary | ICD-10-CM | POA: Diagnosis present

## 2014-04-05 DIAGNOSIS — Z8249 Family history of ischemic heart disease and other diseases of the circulatory system: Secondary | ICD-10-CM

## 2014-04-05 DIAGNOSIS — Z91199 Patient's noncompliance with other medical treatment and regimen due to unspecified reason: Secondary | ICD-10-CM

## 2014-04-05 DIAGNOSIS — N179 Acute kidney failure, unspecified: Secondary | ICD-10-CM | POA: Diagnosis present

## 2014-04-05 DIAGNOSIS — I5042 Chronic combined systolic (congestive) and diastolic (congestive) heart failure: Secondary | ICD-10-CM | POA: Diagnosis present

## 2014-04-05 DIAGNOSIS — N200 Calculus of kidney: Secondary | ICD-10-CM | POA: Diagnosis present

## 2014-04-05 DIAGNOSIS — F17200 Nicotine dependence, unspecified, uncomplicated: Secondary | ICD-10-CM | POA: Diagnosis present

## 2014-04-05 DIAGNOSIS — E860 Dehydration: Secondary | ICD-10-CM | POA: Diagnosis present

## 2014-04-05 DIAGNOSIS — E119 Type 2 diabetes mellitus without complications: Secondary | ICD-10-CM | POA: Diagnosis present

## 2014-04-05 DIAGNOSIS — I2589 Other forms of chronic ischemic heart disease: Secondary | ICD-10-CM | POA: Diagnosis present

## 2014-04-05 DIAGNOSIS — Z7901 Long term (current) use of anticoagulants: Secondary | ICD-10-CM

## 2014-04-05 DIAGNOSIS — Z951 Presence of aortocoronary bypass graft: Secondary | ICD-10-CM

## 2014-04-05 DIAGNOSIS — E785 Hyperlipidemia, unspecified: Secondary | ICD-10-CM | POA: Diagnosis present

## 2014-04-05 DIAGNOSIS — Z9119 Patient's noncompliance with other medical treatment and regimen: Secondary | ICD-10-CM

## 2014-04-05 DIAGNOSIS — E1310 Other specified diabetes mellitus with ketoacidosis without coma: Principal | ICD-10-CM

## 2014-04-05 DIAGNOSIS — I739 Peripheral vascular disease, unspecified: Secondary | ICD-10-CM | POA: Diagnosis present

## 2014-04-05 LAB — URINALYSIS, ROUTINE W REFLEX MICROSCOPIC
Bilirubin Urine: NEGATIVE
Glucose, UA: >1000 mg/dL — AB
Hgb urine dipstick: NEGATIVE
KETONES UR: NEGATIVE mg/dL
LEUKOCYTES UA: NEGATIVE
NITRITE: NEGATIVE
Specific Gravity, Urine: 1.02 (ref 1.005–1.030)
UROBILINOGEN UA: 0.2 mg/dL (ref 0.0–1.0)
pH: 5 (ref 5.0–8.0)

## 2014-04-05 LAB — BASIC METABOLIC PANEL
BUN: 49 mg/dL — ABNORMAL HIGH (ref 6–23)
BUN: 54 mg/dL — ABNORMAL HIGH (ref 6–23)
CALCIUM: 9.9 mg/dL (ref 8.4–10.5)
CALCIUM: 9.8 mg/dL (ref 8.4–10.5)
CHLORIDE: 93 meq/L — AB (ref 96–112)
CO2: 24 mEq/L (ref 19–32)
CO2: 24 mEq/L (ref 19–32)
CREATININE: 1.41 mg/dL — AB (ref 0.50–1.35)
Chloride: 91 mEq/L — ABNORMAL LOW (ref 96–112)
Creatinine, Ser: 1.45 mg/dL — ABNORMAL HIGH (ref 0.50–1.35)
GFR calc Af Amer: 56 mL/min — ABNORMAL LOW (ref >90)
GFR calc non Af Amer: 50 mL/min — ABNORMAL LOW (ref >90)
GFR, EST AFRICAN AMERICAN: 58 mL/min — AB (ref >90)
GFR, EST NON AFRICAN AMERICAN: 48 mL/min — AB (ref >90)
Glucose, Bld: 370 mg/dL — ABNORMAL HIGH (ref 70–99)
Glucose, Bld: 279 mg/dL — ABNORMAL HIGH (ref 70–99)
Potassium: 4.4 mEq/L (ref 3.7–5.3)
Potassium: 4.1 mEq/L (ref 3.7–5.3)
Sodium: 133 mEq/L — ABNORMAL LOW (ref 137–147)
Sodium: 134 mEq/L — ABNORMAL LOW (ref 137–147)

## 2014-04-05 LAB — CBC
HCT: 40.2 % (ref 39.0–52.0)
Hemoglobin: 14.3 g/dL (ref 13.0–17.0)
MCH: 32.4 pg (ref 26.0–34.0)
MCHC: 35.6 g/dL (ref 30.0–36.0)
MCV: 91.2 fL (ref 78.0–100.0)
Platelets: 208 K/uL (ref 150–400)
RBC: 4.41 MIL/uL (ref 4.22–5.81)
RDW: 14.4 % (ref 11.5–15.5)
WBC: 11.4 K/uL — ABNORMAL HIGH (ref 4.0–10.5)

## 2014-04-05 LAB — GLUCOSE, CAPILLARY
GLUCOSE-CAPILLARY: 211 mg/dL — AB (ref 70–99)
Glucose-Capillary: 248 mg/dL — ABNORMAL HIGH (ref 70–99)

## 2014-04-05 LAB — CBG MONITORING, ED
Glucose-Capillary: 406 mg/dL — ABNORMAL HIGH (ref 70–99)
Glucose-Capillary: 316 mg/dL — ABNORMAL HIGH (ref 70–99)
Glucose-Capillary: 250 mg/dL — ABNORMAL HIGH (ref 70–99)

## 2014-04-05 LAB — CK: CK TOTAL: 63 U/L (ref 7–232)

## 2014-04-05 LAB — URINE MICROSCOPIC-ADD ON

## 2014-04-05 LAB — MRSA PCR SCREENING: MRSA BY PCR: NEGATIVE

## 2014-04-05 LAB — LIPASE, BLOOD: Lipase: 26 U/L (ref 11–59)

## 2014-04-05 MED ORDER — CARVEDILOL 12.5 MG PO TABS
25.0000 mg | ORAL_TABLET | Freq: Two times a day (BID) | ORAL | Status: DC
  Administered 2014-04-05 – 2014-04-10 (×9): 25 mg via ORAL
  Filled 2014-04-05 (×10): qty 2

## 2014-04-05 MED ORDER — DEXTROSE-NACL 5-0.45 % IV SOLN
INTRAVENOUS | Status: DC
  Administered 2014-04-05: 22:00:00 via INTRAVENOUS

## 2014-04-05 MED ORDER — SODIUM CHLORIDE 0.9 % IV SOLN
INTRAVENOUS | Status: DC
  Administered 2014-04-05: 1.9 [IU]/h via INTRAVENOUS
  Filled 2014-04-05: qty 1

## 2014-04-05 MED ORDER — SODIUM CHLORIDE 0.9 % IV SOLN: INTRAVENOUS | Status: AC

## 2014-04-05 MED ORDER — ACETAMINOPHEN-CODEINE #2 300-15 MG PO TABS: 1.0000 | ORAL_TABLET | Freq: Three times a day (TID) | ORAL | Status: DC | PRN

## 2014-04-05 MED ORDER — POLYETHYLENE GLYCOL 3350 17 G PO PACK: 17.0000 g | PACK | Freq: Every day | ORAL | Status: DC | PRN

## 2014-04-05 MED ORDER — PANTOPRAZOLE SODIUM 40 MG PO TBEC
40.0000 mg | DELAYED_RELEASE_TABLET | Freq: Every day | ORAL | Status: DC
  Administered 2014-04-05 – 2014-04-10 (×6): 40 mg via ORAL
  Filled 2014-04-05 (×6): qty 1

## 2014-04-05 MED ORDER — SODIUM CHLORIDE 0.9 % IV SOLN: 1000.0000 mL | INTRAVENOUS | Status: DC

## 2014-04-05 MED ORDER — ONDANSETRON HCL 4 MG PO TABS: 4.0000 mg | ORAL_TABLET | Freq: Four times a day (QID) | ORAL | Status: DC | PRN

## 2014-04-05 MED ORDER — DIGOXIN 125 MCG PO TABS
125.0000 ug | ORAL_TABLET | Freq: Every day | ORAL | Status: DC
  Administered 2014-04-05 – 2014-04-10 (×6): 125 ug via ORAL
  Filled 2014-04-05 (×6): qty 1

## 2014-04-05 MED ORDER — GUAIFENESIN-DM 100-10 MG/5ML PO SYRP: 5.0000 mL | ORAL_SOLUTION | ORAL | Status: DC | PRN

## 2014-04-05 MED ORDER — SODIUM CHLORIDE 0.9 % IV SOLN: 1000.0000 mL | Freq: Once | INTRAVENOUS | Status: DC

## 2014-04-05 MED ORDER — NITROGLYCERIN 0.4 MG SL SUBL: 0.4000 mg | SUBLINGUAL_TABLET | SUBLINGUAL | Status: DC | PRN

## 2014-04-05 MED ORDER — ISOSORBIDE MONONITRATE ER 60 MG PO TB24
30.0000 mg | ORAL_TABLET | Freq: Every day | ORAL | Status: DC
  Administered 2014-04-05 – 2014-04-10 (×6): 30 mg via ORAL
  Filled 2014-04-05 (×6): qty 1

## 2014-04-05 MED ORDER — SODIUM CHLORIDE 0.9 % IJ SOLN
3.0000 mL | Freq: Two times a day (BID) | INTRAMUSCULAR | Status: DC
  Administered 2014-04-05 – 2014-04-09 (×5): 3 mL via INTRAVENOUS

## 2014-04-05 MED ORDER — INSULIN GLARGINE 100 UNIT/ML ~~LOC~~ SOLN
10.0000 [IU] | Freq: Two times a day (BID) | SUBCUTANEOUS | Status: DC
  Administered 2014-04-06: 10 [IU] via SUBCUTANEOUS
  Filled 2014-04-05 (×4): qty 0.1

## 2014-04-05 MED ORDER — SODIUM CHLORIDE 0.9 % IV SOLN
INTRAVENOUS | Status: DC
  Administered 2014-04-05 – 2014-04-06 (×2): via INTRAVENOUS
  Administered 2014-04-07: 1000 mL via INTRAVENOUS
  Administered 2014-04-08 – 2014-04-10 (×4): via INTRAVENOUS

## 2014-04-05 MED ORDER — SODIUM CHLORIDE 0.9 % IV SOLN
INTRAVENOUS | Status: DC
  Administered 2014-04-05: 3.9 [IU]/h via INTRAVENOUS
  Filled 2014-04-05: qty 1

## 2014-04-05 MED ORDER — ATORVASTATIN CALCIUM 10 MG PO TABS
10.0000 mg | ORAL_TABLET | Freq: Every day | ORAL | Status: DC
  Administered 2014-04-06 – 2014-04-09 (×4): 10 mg via ORAL
  Filled 2014-04-05 (×4): qty 1

## 2014-04-05 MED ORDER — ONDANSETRON HCL 4 MG/2ML IJ SOLN: 4.0000 mg | Freq: Four times a day (QID) | INTRAMUSCULAR | Status: DC | PRN

## 2014-04-05 MED ORDER — DABIGATRAN ETEXILATE MESYLATE 150 MG PO CAPS
150.0000 mg | ORAL_CAPSULE | Freq: Two times a day (BID) | ORAL | Status: DC
  Administered 2014-04-06 – 2014-04-10 (×10): 150 mg via ORAL
  Filled 2014-04-05 (×10): qty 1

## 2014-04-05 MED ORDER — DEXTROSE 50 % IV SOLN: 25.0000 mL | INTRAVENOUS | Status: DC | PRN

## 2014-04-05 NOTE — H&P (Signed)
Patient Demographics  [name], is a 68 y.o. male  MRN: [id number]   DOB - [birth year]  Admit Date - 04/05/2014  Outpatient Primary MD for the patient is Alice ReichertMCINNIS,ANGUS G, MD   With History of -  Past Medical History  Diagnosis Date  . Coronary atherosclerosis of native coronary artery     Ant. MI in 4/95; PTCA of 90% prox & distal LAD unsuccessful-->  Urgent CABG-4/95 TO Cx; 50% RCA; ant. HK; EF of 50-55%; 08/2008: 3-V disease plus a  95% LIMA stenosis, treated with DES; occlusion of SVG to CX; RCA SVG stenosis--> PCI with DES  2010: in-stent restenosis in the LIMA-->cutting balloon; EF of 35-40%;  07/2010: TO of SVG to     RCA-medical therapy advised; EF of 30-35% by echo in 12/2010  . Atrial fibrillation 1995    1995, 2009; bradycardia with beta blocker therapy  . Tobacco abuse     40-50 pack years; currently one pack per day  . Peripheral vascular disease 1995    Abdominal aortic obstruction 1995-->vascular surgery  . Hyperlipidemia   . Type 2 diabetes mellitus   . DDD (degenerative disc disease)     Discectomy and fusion at L4 and L5  . Alcohol use 1999    Excessive alcohol use- quit in 1999  . Chronic systolic heart failure     LVEF 25-30% 2011  . Non-compliance       Past Surgical History  Procedure Laterality Date  . Cholecystectomy  1995  . Lumbar fusion      +Discectomy;x2; L4 and L5  . Aorto-femoral bypass graft  1995    in for   Chief Complaint  Patient presents with  . Hyperglycemia     HPI  [name]  is a 68 y.o. 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 Patient himself denies any headache, no fever chills, no chest pain palpitations cough phlegm shortness of breath, no abdominal pain or diarrhea, no dysuria, surprisingly denies any polyphagia or polydipsia, no polyuria. No new focal weakness. He is readily symptom-free except for generalized weakness and fatigue along with high at sugars for the last 1 week.    Review of Systems    In addition to the HPI above,   No Fever-chills, No Headache, No changes with Vision or hearing, No problems swallowing food or Liquids, No Chest pain, Cough or Shortness of Breath, No Abdominal pain, No Nausea or Vommitting, Bowel movements are regular, No Blood in stool or Urine, No dysuria, No new skin rashes or bruises, No new joints  pains-aches,  No new weakness, tingling, numbness in any extremity, generalized weakness, No recent weight gain or loss, No polyuria, polydypsia or polyphagia, No significant Mental Stressors.  A full 10 point Review of Systems was done, except as stated above, all other Review of Systems were negative.   Social History History  Substance Use Topics  . Smoking status: Current Every Day Smoker -- 0.50 packs/day    Types: Cigarettes  . Smokeless tobacco: Current User     Comment: refused  . Alcohol Use: No     Comment: former      Family History Family History  Problem Relation Age of Onset  . CAD Father       Prior to Admission medications   Medication Sig Start Date End Date Taking? Authorizing Provider  carvedilol (COREG) 25 MG tablet Take 25 mg by mouth 2 (two) times daily.   Yes Historical Provider, MD  dabigatran (PRADAXA) 150 MG CAPS capsule Take 150 mg by mouth 2 (two) times daily.   Yes Historical Provider, MD  rosuvastatin (CRESTOR) 40 MG tablet Take 40 mg by mouth daily.   Yes Historical  Provider, MD  acetaminophen-codeine (TYLENOL #2) 300-15 MG per tablet Take 1 tablet by mouth every 4 (four) hours as needed for moderate pain.    Historical Provider, MD  digoxin (LANOXIN) 0.125 MG tablet Take 125 mcg by mouth daily.      Historical Provider, MD  furosemide (LASIX) 40 MG tablet Take 40 mg every other day. 01/30/14   Antoine Poche, MD  hydrocodone-acetaminophen (LORCET-HD) 5-500 MG per capsule Take 1 capsule by mouth every 6 (six) hours as needed for pain.     Historical Provider, MD  isosorbide mononitrate (IMDUR) 30 MG 24 hr tablet Take 1 tablet (30 mg total) by mouth daily. 09/08/13   Angus Edilia Bo, MD  lisinopril (PRINIVIL,ZESTRIL) 5 MG tablet Take 1 tablet (5 mg total) by mouth daily. 02/28/14   Antoine Poche, MD  nitroGLYCERIN (NITROSTAT) 0.4 MG SL tablet Place 1 tablet (0.4 mg total) under the tongue every 5 (five) minutes as needed. 05/24/12   Kathlen Brunswick, MD  pantoprazole (PROTONIX) 40 MG tablet Take 1 tablet (40 mg total) by mouth daily. 12/16/13   Thereasa Parkin, PA-C  potassium chloride SA (K-DUR,KLOR-CON) 20 MEQ tablet Take 1 tablet (20 mEq total) by mouth 2 (two) times daily. 12/27/13   Jodelle Gross, NP  spironolactone (ALDACTONE) 25 MG tablet Take 25 mg by mouth daily.      Historical Provider, MD    No Known Allergies  Physical Exam  Vitals  Blood pressure 137/103, pulse 81, temperature 94 F (34.4 C), temperature source Oral, resp. rate 18, height 5' 5.5" (1.664 m), weight 80.74 kg (178 lb), SpO2 100.00%.   1. General poorly kept middle-aged white male lying in bed in NAD,    2. Normal affect and insight, Not Suicidal or Homicidal, Awake Alert, Oriented X 3.  3. No F.N deficits, ALL C.Nerves Intact, Strength 5/5 all 4 extremities, Sensation intact all 4 extremities, Plantars down going.  4. Ears and Eyes appear Normal, Conjunctivae clear, PERRLA. Moist Oral Mucosa.  5. Supple Neck, No JVD, No cervical lymphadenopathy appriciated, No Carotid  Bruits.  6. Symmetrical Chest wall movement, Good air movement bilaterally, CTAB.  7. RRR, No Gallops, Rubs or Murmurs, No Parasternal Heave.  8. Positive Bowel Sounds, Abdomen Soft, Non tender, No organomegaly appriciated,No rebound -guarding or rigidity.  9.  No Cyanosis, Normal Skin  Turgor, No Skin Rash or Bruise.  10. Good muscle tone,  joints appear normal , no effusions, Normal ROM.  11. No Palpable Lymph Nodes in Neck or Axillae     Data Review  CBC  Recent Labs Lab 04/05/14 1736  WBC 11.4*  HGB 14.3  HCT 40.2  PLT 208  MCV 91.2  MCH 32.4  MCHC 35.6  RDW 14.4   ------------------------------------------------------------------------------------------------------------------  Chemistries   Recent Labs Lab 04/05/14 1736  NA 133*  K 4.4  CL 91*  CO2 24  GLUCOSE 370*  BUN 49*  CREATININE 1.41*  CALCIUM 9.9   ------------------------------------------------------------------------------------------------------------------ estimated creatinine clearance is 50.3 ml/min (by C-G formula based on Cr of 1.41). ------------------------------------------------------------------------------------------------------------------ No results found for this basename: TSH, T4TOTAL, FREET3, T3FREE, THYROIDAB,  in the last 72 hours   Coagulation profile No results found for this basename: INR, PROTIME,  in the last 168 hours ------------------------------------------------------------------------------------------------------------------- No results found for this basename: DDIMER,  in the last 72 hours -------------------------------------------------------------------------------------------------------------------  Cardiac Enzymes No results found for this basename: CK, CKMB, TROPONINI, MYOGLOBIN,  in the last 168 hours ------------------------------------------------------------------------------------------------------------------ No components found with this  basename: POCBNP,    ---------------------------------------------------------------------------------------------------------------  Urinalysis    Component Value Date/Time   COLORURINE YELLOW 04/05/2014 1917   APPEARANCEUR CLEAR 04/05/2014 1917   LABSPEC 1.020 04/05/2014 1917   PHURINE 5.0 04/05/2014 1917   GLUCOSEU >1000* 04/05/2014 1917   HGBUR NEGATIVE 04/05/2014 1917   BILIRUBINUR NEGATIVE 04/05/2014 1917   KETONESUR NEGATIVE 04/05/2014 1917   PROTEINUR TRACE* 04/05/2014 1917   UROBILINOGEN 0.2 04/05/2014 1917   NITRITE NEGATIVE 04/05/2014 1917   LEUKOCYTESUR NEGATIVE 04/05/2014 1917    ----------------------------------------------------------------------------------------------------------------  Imaging results:      Kindly follow baseline chest x-ray, EKG   ordered in the ER    Assessment & Plan    1. DKA in a patient with DM type II who is on Amaryl. Will be admitted to step down, will be initiated on IV insulin drip protocol, will place on low-dose Lantus twice a day from 10 PM tonight to bring down insulin drip requirements, we'll check A1c, we'll hydrate with IV fluids gently in the light of his underlying CHF. Likely will require insulin upon discharge him a appropriate teaching prior to discharge. We'll check baseline chest x-ray and UA kindly follow in the morning.    2. Ischemic cardiomyopathy with chronic combined systolic and diastolic heart failure EF 25%. Currently dehydrated, hold home dose Lasix, IV fluids due to #1 above, continue Coreg.    3. Atrial fibrillation and CAD. No acute issues, chest pain-free, shortness of breath 3. No palpitations. Will obtain baseline EKG, telemetry monitoring, continue digoxin along with Coreg and Pradaxa.    4. History of smoking. Counseled to quit smoking.    5. ARF. Due to #1, hydrate, hold ACE inhibitor and Lasix.    Note temperature listed above is wrong, he appears to be euthermic, have instructed nurses to  kindly check a rectal temperature.     DVT Prophylaxis Pradaxa  AM Labs Ordered, also please review Full Orders  Family Communication: Admission, patients condition and plan of care including tests being ordered have been discussed with the patient   who indicates understanding and agree with the plan and Code Status.  Code Status full  Likely DC to  home  Condition GUARDED     Time spent in minutes : 35    Leroy SeaPrashant K Singh M.D on 04/05/2014 at 8:06 PM  Between  7am to 7pm - Pager - [phone]  After 7pm go to [email or web address] - password [email or web address]  And look for the night coverage person covering me after hours  [facility] Office  [phone]
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
## 2014-04-05 NOTE — ED Notes (Signed)
Report given to Allegra GranaBob Rn. Patient ready for transfer

## 2014-04-05 NOTE — ED Notes (Signed)
IV placed by Dr. Fayrene FearingJames in R upper arm.

## 2014-04-05 NOTE — ED Notes (Signed)
Pt co elevated blood sugar 400s per pt. Denies N/V.

## 2014-04-06 LAB — BASIC METABOLIC PANEL
BUN: 53 mg/dL — ABNORMAL HIGH (ref 6–23)
BUN: 53 mg/dL — ABNORMAL HIGH (ref 6–23)
BUN: 48 mg/dL — AB (ref 6–23)
CALCIUM: 9.8 mg/dL (ref 8.4–10.5)
CALCIUM: 9.7 mg/dL (ref 8.4–10.5)
CO2: 26 mEq/L (ref 19–32)
CO2: 20 mEq/L (ref 19–32)
CO2: 26 meq/L (ref 19–32)
Calcium: 9.7 mg/dL (ref 8.4–10.5)
Chloride: 94 mEq/L — ABNORMAL LOW (ref 96–112)
Chloride: 99 mEq/L (ref 96–112)
Chloride: 95 mEq/L — ABNORMAL LOW (ref 96–112)
Creatinine, Ser: 1.48 mg/dL — ABNORMAL HIGH (ref 0.50–1.35)
Creatinine, Ser: 1.31 mg/dL (ref 0.50–1.35)
Creatinine, Ser: 1.28 mg/dL (ref 0.50–1.35)
GFR calc Af Amer: 65 mL/min — ABNORMAL LOW (ref >90)
GFR, EST AFRICAN AMERICAN: 55 mL/min — AB (ref >90)
GFR, EST AFRICAN AMERICAN: 63 mL/min — AB (ref >90)
GFR, EST NON AFRICAN AMERICAN: 47 mL/min — AB (ref >90)
GFR, EST NON AFRICAN AMERICAN: 55 mL/min — AB (ref >90)
GFR, EST NON AFRICAN AMERICAN: 56 mL/min — AB (ref >90)
GLUCOSE: 260 mg/dL — AB (ref 70–99)
Glucose, Bld: 304 mg/dL — ABNORMAL HIGH (ref 70–99)
Glucose, Bld: 133 mg/dL — ABNORMAL HIGH (ref 70–99)
POTASSIUM: 4.1 meq/L (ref 3.7–5.3)
Potassium: 4.3 mEq/L (ref 3.7–5.3)
Potassium: 3.8 mEq/L (ref 3.7–5.3)
SODIUM: 134 meq/L — AB (ref 137–147)
SODIUM: 137 meq/L (ref 137–147)
SODIUM: 135 meq/L — AB (ref 137–147)

## 2014-04-06 LAB — GLUCOSE, CAPILLARY
GLUCOSE-CAPILLARY: 102 mg/dL — AB (ref 70–99)
GLUCOSE-CAPILLARY: 113 mg/dL — AB (ref 70–99)
GLUCOSE-CAPILLARY: 151 mg/dL — AB (ref 70–99)
GLUCOSE-CAPILLARY: 177 mg/dL — AB (ref 70–99)
GLUCOSE-CAPILLARY: 255 mg/dL — AB (ref 70–99)
GLUCOSE-CAPILLARY: 283 mg/dL — AB (ref 70–99)
Glucose-Capillary: 273 mg/dL — ABNORMAL HIGH (ref 70–99)
Glucose-Capillary: 274 mg/dL — ABNORMAL HIGH (ref 70–99)
Glucose-Capillary: 124 mg/dL — ABNORMAL HIGH (ref 70–99)
Glucose-Capillary: 121 mg/dL — ABNORMAL HIGH (ref 70–99)
Glucose-Capillary: 117 mg/dL — ABNORMAL HIGH (ref 70–99)
Glucose-Capillary: 320 mg/dL — ABNORMAL HIGH (ref 70–99)
Glucose-Capillary: 244 mg/dL — ABNORMAL HIGH (ref 70–99)
Glucose-Capillary: 237 mg/dL — ABNORMAL HIGH (ref 70–99)
Glucose-Capillary: 224 mg/dL — ABNORMAL HIGH (ref 70–99)
Glucose-Capillary: 194 mg/dL — ABNORMAL HIGH (ref 70–99)

## 2014-04-06 LAB — CBC
HEMATOCRIT: 40.5 % (ref 39.0–52.0)
Hemoglobin: 14.4 g/dL (ref 13.0–17.0)
MCH: 32.3 pg (ref 26.0–34.0)
MCHC: 35.6 g/dL (ref 30.0–36.0)
MCV: 90.8 fL (ref 78.0–100.0)
Platelets: 194 10*3/uL (ref 150–400)
RBC: 4.46 MIL/uL (ref 4.22–5.81)
RDW: 14.4 % (ref 11.5–15.5)
WBC: 11 10*3/uL — ABNORMAL HIGH (ref 4.0–10.5)

## 2014-04-06 LAB — HEMOGLOBIN A1C
Hgb A1c MFr Bld: 9.4 % — ABNORMAL HIGH (ref <5.7)
Mean Plasma Glucose: 223 mg/dL — ABNORMAL HIGH (ref <117)

## 2014-04-06 MED ORDER — INSULIN ASPART 100 UNIT/ML ~~LOC~~ SOLN
0.0000 [IU] | Freq: Three times a day (TID) | SUBCUTANEOUS | Status: DC
  Administered 2014-04-06: 3 [IU] via SUBCUTANEOUS
  Administered 2014-04-07: 8 [IU] via SUBCUTANEOUS
  Administered 2014-04-07: 5 [IU] via SUBCUTANEOUS
  Administered 2014-04-07: 11 [IU] via SUBCUTANEOUS
  Administered 2014-04-08: 8 [IU] via SUBCUTANEOUS
  Administered 2014-04-08: 5 [IU] via SUBCUTANEOUS
  Administered 2014-04-08: 15 [IU] via SUBCUTANEOUS
  Administered 2014-04-09: 8 [IU] via SUBCUTANEOUS
  Administered 2014-04-09: 11 [IU] via SUBCUTANEOUS
  Administered 2014-04-09: 8 [IU] via SUBCUTANEOUS
  Administered 2014-04-10: 5 [IU] via SUBCUTANEOUS

## 2014-04-06 MED ORDER — DABIGATRAN ETEXILATE MESYLATE 150 MG PO CAPS
ORAL_CAPSULE | ORAL | Status: AC
  Filled 2014-04-06: qty 1

## 2014-04-06 MED ORDER — INSULIN DETEMIR 100 UNIT/ML ~~LOC~~ SOLN
14.0000 [IU] | SUBCUTANEOUS | Status: DC
  Administered 2014-04-06 – 2014-04-07 (×2): 14 [IU] via SUBCUTANEOUS
  Filled 2014-04-06 (×5): qty 0.14

## 2014-04-06 MED ORDER — INSULIN ASPART 100 UNIT/ML ~~LOC~~ SOLN
0.0000 [IU] | Freq: Every day | SUBCUTANEOUS | Status: DC
Start: 2014-04-06 — End: 2014-04-10
  Administered 2014-04-07: 3 [IU] via SUBCUTANEOUS
  Administered 2014-04-08: 4 [IU] via SUBCUTANEOUS
  Administered 2014-04-09: 3 [IU] via SUBCUTANEOUS

## 2014-04-06 MED ORDER — ACETAMINOPHEN-CODEINE #3 300-30 MG PO TABS
0.5000 | ORAL_TABLET | Freq: Three times a day (TID) | ORAL | Status: DC | PRN
  Administered 2014-04-06 – 2014-04-08 (×2): 0.5 via ORAL
  Filled 2014-04-06 (×2): qty 1

## 2014-04-06 MED ORDER — ACETAMINOPHEN 325 MG PO TABS
162.5000 mg | ORAL_TABLET | Freq: Three times a day (TID) | ORAL | Status: DC | PRN
  Administered 2014-04-08: 162.5 mg via ORAL
  Filled 2014-04-06: qty 1

## 2014-04-06 MED ORDER — INSULIN REGULAR BOLUS VIA INFUSION
0.0000 [IU] | Freq: Three times a day (TID) | INTRAVENOUS | Status: DC
  Administered 2014-04-06: 8 [IU] via INTRAVENOUS
  Filled 2014-04-06: qty 10

## 2014-04-06 NOTE — Care Management Utilization Note (Signed)
UR completed 

## 2014-04-06 NOTE — Progress Notes (Signed)
Subjective: The patient is alert and oriented he remains on IV fluids his blood sugars have come down now ranging from 149-193. As stated he is relatively asymptomatic.  He has been started on basal insulin and sliding scale NovoLog insulin  Vital signs in last 24 hours: Temp:  [94 F (34.4 C)-98.6 F (37 C)] 97.5 F (36.4 C) (04/16 1130) Pulse Rate:  [55-83] 66 (04/16 1200) Resp:  [14-27] 24 (04/16 1500) BP: (85-137)/(52-104) 106/65 mmHg (04/16 1500) SpO2:  [86 %-100 %] 100 % (04/16 1200) Weight:  [71.4 kg (157 lb 6.5 oz)-80.74 kg (178 lb)] 74.7 kg (164 lb 10.9 oz) (04/16 0500) Weight change:  Last BM Date: 04/04/14  Intake/Output from previous day: 04/15 0701 - 04/16 0700 In: 732 [I.V.:732] Out: 475 [Urine:475] Intake/Output this shift: Total I/O In: 384.7 [I.V.:384.7] Out: -   Physical Exam: The patient is alert and oriented HEENT negative  Lungs clear NA  Heart regular rhythm no murmurs  Abdomen the palpable organs or masses  Extremities free of edema    Recent Labs  04/05/14 1736 04/06/14 0558  WBC 11.4* 11.0*  HGB 14.3 14.4  HCT 40.2 40.5  PLT 208 194   BMET  Recent Labs  04/06/14 0558 04/06/14 1238  NA 137 135*  K 4.1 3.8  CL 99 95*  CO2 20 26  GLUCOSE 133* 260*  BUN 53* 48*  CREATININE 1.31 1.28  CALCIUM 9.7 9.7    Studies/Results: Dg Chest Port 1 View  04/05/2014   CLINICAL DATA:  Cough with shortness of breath and hyperglycemia.  EXAM: PORTABLE CHEST - 1 VIEW  COMPARISON:  12/10/2013  FINDINGS: Sternotomy wires are unchanged. Lungs are adequately inflated with mild prominence of the perihilar markings likely minimal vascular congestion. There is cardiomegaly improved. Remainder of the exam is unchanged.  IMPRESSION: Mild cardiomegaly with suggestion of minimal vascular congestion.   Electronically Signed   By: Elberta Fortisaniel  Boyle M.D.   On: 04/05/2014 20:35    Medications:  . atorvastatin  10 mg Oral q1800  . carvedilol  25 mg Oral BID  .  dabigatran  150 mg Oral BID  . digoxin  125 mcg Oral Daily  . insulin aspart  0-15 Units Subcutaneous TID WC  . insulin aspart  0-5 Units Subcutaneous QHS  . insulin detemir  14 Units Subcutaneous Q24H  . insulin regular  0-10 Units Intravenous TID WC  . isosorbide mononitrate  30 mg Oral Daily  . pantoprazole  40 mg Oral Daily  . sodium chloride  3 mL Intravenous Q12H    . sodium chloride Stopped (04/06/14 0158)  . insulin (NOVOLIN-R) infusion 8.2 Units/hr (04/06/14 1532)     Assessment/Plan: Diabetes mellitus with ketoacidosis plan to continue current IV fluids, continue current regimen with gradual transition to basal insulin and short-acting insulin  Cardiomyopathy with chronic combined systolic and diastolic heart failure ejection fraction 25% plan to continue Coreg with continued daily weights will start furosemide packet a.m.  LOS: 1 day   Renn Stille G Avonlea Sima 04/06/2014, 4:48 PM

## 2014-04-06 NOTE — Progress Notes (Signed)
Inpatient Diabetes Program Recommendations  AACE/ADA: New Consensus Statement on Inpatient Glycemic Control (2013)  Target Ranges:  Prepandial:   less than 140 mg/dL      Peak postprandial:   less than 180 mg/dL (1-2 hours)      Critically ill patients:  140 - 180 mg/dL   Results for [name], Eliud F (MRN [id number]) as of 04/06/2014 13:42  Ref. Range 04/06/2014 05:06 04/06/2014 06:00 04/06/2014 06:56 04/06/2014 07:50 04/06/2014 08:57 04/06/2014 10:26 04/06/2014 11:31 04/06/2014 13:20  Glucose-Capillary Latest Range: 70-99 mg/dL 409124 (H) 811121 (H) 914102 (H) 113 (H) 117 (H) 283 (H) 320 (H) 244 (H)   Diabetes history: DM2 Outpatient Diabetes medications: Levemir and Novolog (per Dr. Renard MatterMcInnis) as pt states he does not know Current orders for Inpatient glycemic control: Novolin R insulin drip via Glucostabilizer  Inpatient Diabetes Program Recommendations Insulin - Basal: At time of transition from IV to SQ insulin; recommend starting Levemir 14 units Q24H (starting now, based on 74 kg x 0.2 units). Correction (SSI): At time of transition from IV to SQ insulin; recommend starting Novolog moderate correction scale ACHS.  Note: Blood glucose had been trending well in target until he ate breakfast this morning and did not receive an insulin bolus to cover his carbohydrates.  According to the BMET done on 04/06/14 at 12:38, CO2 is 26, AG is 14. Since CO2 is 26, do not feel that AG is related to ketones but perhaps from some other source.  Recommend transitioning from IV insulin to SQ insulin and have made recommendations.    Spoke with patient about diabetes and home regimen for diabetes control.  Patient reports that he sees Dr. Renard MatterMcInnis who helps him manage his diabetes.  In talking with the patient, he is adamant that he knows what he needs to do to take care of hisself and he "does need anybody to tell him what to do".  When asked about home DM medications, patient states that he has been on insulin "for a long  time".  Informed patient that according to the home medication list from his last hospitalization from 03/18/14 to 03/22/14 he was previously on Metformin and Amaryl.  Patient states that information is wrong and that he was already on insulin.  Inquired about which insulins he was taking at home and he stated "I don't know what the name of the insulin is."  Inquired about dosages of insulins being taken and patient stated "I don't know.  I take what I am suppose to."  Patient [facility] Called [facility] and was informed that the only insulin patient was prescribed and had filled was Novolog which was prescribed at Novolog 0-8 units QID (sliding scale).  Inquired about Levemir and was told there was not a prescription for Levemir.  Called Dr. Lorenso CourierMcInnis's office to inquire about DM medications.  According to the nurse at Dr. Lorenso CourierMcInnis's office, patient was previously on PO DM medications but was recently (within last few weeks) started on insulin.  The chart reflects patient was on Novolog sliding scale and Levemir had been discussed with the patient.  Discussed A1C results (10.1% on 03/19/14) and explained what an A1C is, basic pathophysiology of DM Type 2, basic home care, importance of checking CBGs and maintaining good CBG control to prevent long-term and short-term complications. Patient was adamant that he did not need to be told any of this  Information and he "knew what he needed to do."  Patient  reports that he checks his blood sugar 2-3 times per day. When asked what type of numbers he had been getting on his glucometer, he states he does not remember. Informed patient of normal, low, and high blood glucose values and goal A1C.  Stressed importance of patient taking insulin as prescribed to ensure his diabetes stays controlled.  Asked patient if he understood information discussed and he states "I know what to do." Talked with JJ, RN and also with  Louie CasaGeneva, RN, CM to let her know that patient needs Home Health to follow up.  Will continue to follow while inpatient.   Thanks, Orlando PennerMarie Janet Humphreys, RN, MSN, CCRN Diabetes Coordinator Inpatient Diabetes Program [phone] (Team Pager) [phone] (AP office) [phone] [facility] office)

## 2014-04-06 NOTE — Plan of Care (Signed)
Problem: Phase I Progression Outcomes Goal: CBGs steadily decreasing on IV insulin drip Outcome: Progressing On glucostabilizer, steadily declining anion gap slowly decreasing at present 0200 it is 14

## 2014-04-06 NOTE — Progress Notes (Signed)
Nutrition Brief Note  Patient identified on the Malnutrition Screening Tool (MST) Report  Wt Readings from Last 15 Encounters:  04/06/14 164 lb 10.9 oz (74.7 kg)  03/20/14 160 lb 4.4 oz (72.7 kg)  03/07/14 172 lb (78.019 kg)  02/28/14 151 lb (68.493 kg)  01/30/14 158 lb (71.668 kg)  01/06/14 159 lb (72.122 kg)  12/30/13 184 lb (83.462 kg)  12/27/13 183 lb (83.008 kg)  12/16/13 172 lb 6.4 oz (78.2 kg)  12/16/13 172 lb 6.4 oz (78.2 kg)  09/18/13 177 lb 7.5 oz (80.5 kg)  09/08/13 170 lb (77.111 kg)  04/13/13 177 lb 1.9 oz (80.341 kg)  07/11/12 187 lb (84.823 kg)  04/28/12 183 lb (83.008 kg)   Pt admitted with DKA, currently on glucostabilizer. Good appetite PTA. Pt with hx of weight fluctuations and weight loss, likely due to uncontrolled diabetes. Hgb A1c ranges 9-14 within the past year, most current Hgb A1c 10. Pt very resistant to education during last discharge on 03/22/14.  Body mass index is 24.31 kg/(m^2). Patient meets criteria for normal weight based on current BMI.   Current diet order is carb modified, patient is consuming approximately n/a% of meals at this time. Labs and medications reviewed.   No nutrition interventions warranted at this time. If nutrition issues arise, please consult RD.   [name], RD, LDN Pager: [phone]

## 2014-04-07 LAB — BASIC METABOLIC PANEL
BUN: 38 mg/dL — AB (ref 6–23)
CO2: 20 mEq/L (ref 19–32)
CREATININE: 1.08 mg/dL (ref 0.50–1.35)
Calcium: 9.2 mg/dL (ref 8.4–10.5)
Chloride: 99 mEq/L (ref 96–112)
GFR calc Af Amer: 80 mL/min — ABNORMAL LOW (ref >90)
GFR, EST NON AFRICAN AMERICAN: 69 mL/min — AB (ref >90)
GLUCOSE: 245 mg/dL — AB (ref 70–99)
Potassium: 4 mEq/L (ref 3.7–5.3)
Sodium: 135 mEq/L — ABNORMAL LOW (ref 137–147)

## 2014-04-07 LAB — GLUCOSE, CAPILLARY
GLUCOSE-CAPILLARY: 261 mg/dL — AB (ref 70–99)
Glucose-Capillary: 222 mg/dL — ABNORMAL HIGH (ref 70–99)
Glucose-Capillary: 316 mg/dL — ABNORMAL HIGH (ref 70–99)
Glucose-Capillary: 260 mg/dL — ABNORMAL HIGH (ref 70–99)

## 2014-04-07 MED ORDER — INSULIN DETEMIR 100 UNIT/ML ~~LOC~~ SOLN
20.0000 [IU] | Freq: Every day | SUBCUTANEOUS | Status: DC
  Administered 2014-04-08: 20 [IU] via SUBCUTANEOUS
  Filled 2014-04-07 (×2): qty 0.2

## 2014-04-07 MED ORDER — INSULIN DETEMIR 100 UNIT/ML ~~LOC~~ SOLN
SUBCUTANEOUS | Status: AC
  Filled 2014-04-07: qty 1

## 2014-04-07 MED ORDER — INSULIN DETEMIR 100 UNIT/ML ~~LOC~~ SOLN
20.0000 [IU] | SUBCUTANEOUS | Status: DC
  Filled 2014-04-07 (×3): qty 0.2

## 2014-04-07 MED ORDER — FUROSEMIDE 20 MG PO TABS
20.0000 mg | ORAL_TABLET | Freq: Every day | ORAL | Status: DC
  Administered 2014-04-07 – 2014-04-10 (×4): 20 mg via ORAL
  Filled 2014-04-07 (×4): qty 1

## 2014-04-07 MED ORDER — INSULIN DETEMIR 100 UNIT/ML ~~LOC~~ SOLN
6.0000 [IU] | Freq: Once | SUBCUTANEOUS | Status: AC
  Administered 2014-04-07: 6 [IU] via SUBCUTANEOUS
  Filled 2014-04-07: qty 0.06

## 2014-04-07 NOTE — Plan of Care (Signed)
Problem: Phase II Progression Outcomes Goal: CBGs stable on SQ insulin Outcome: Progressing Blood sugar remaining less than 200 this evening requiring no SS coverage Goal: Tolerating PO clear liquid diet Outcome: Completed/Met Date Met:  04/07/14 Diet has been increased to carb modified Goal: Nausea & vomiting resolved Outcome: Not Applicable Date Met:  39/76/73 No nausea or vomiting Goal: Discharge plan established Outcome: Progressing Patient desires to go home but per daughter remains noncompliant with diabetic regimen to improve health status

## 2014-04-07 NOTE — Progress Notes (Signed)
Inpatient Diabetes Program Recommendations  AACE/ADA: New Consensus Statement on Inpatient Glycemic Control (2013)  Target Ranges:  Prepandial:   less than 140 mg/dL      Peak postprandial:   less than 180 mg/dL (1-2 hours)      Critically ill patients:  140 - 180 mg/dL   Pt on IV insulin drip yesterday afternoon in addition to Lantus 10 units and levemir 14 units for total basal of 24 units yesterday. Yet cbg's's running high today in 200's-300's in addition to moderate correction tidwc and HS correction.  Recommend an increase of basal to 25 units levemir/lantus and add meal coverage of at least 3 units tidwc novolog. Thank you, Lenor CoffinAnn Malak Orantes, RN, CNS, Diabetes Coordinator [phone])

## 2014-04-07 NOTE — Progress Notes (Signed)
Subjective: The patient had an uneventful night. He was admitted to the hospital with elevated blood sugars and possible DKA. His sugars have improved most recent 260. He is breathing comfortably.  Objective: Vital signs in last 24 hours: Temp:  [97.5 F (36.4 C)-98.7 F (37.1 C)] 98.4 F (36.9 C) (04/17 0400) Pulse Rate:  [54-71] 70 (04/16 2100) Resp:  [13-26] 21 (04/17 0600) BP: (81-156)/(47-104) 107/53 mmHg (04/17 0600) SpO2:  [96 %-100 %] 98 % (04/17 0000) Weight:  [77.2 kg (170 lb 3.1 oz)] 77.2 kg (170 lb 3.1 oz) (04/17 0500) Weight change: -3.54 kg (-7 lb 12.9 oz) Last BM Date: 04/06/14  Intake/Output from previous day: 04/16 0701 - 04/17 0700 In: 2715.2 [P.O.:1080; I.V.:1635.2] Out: 1000 [Urine:1000] Intake/Output this shift: Total I/O In: 900 [I.V.:900] Out: 600 [Urine:600]  Physical Exam: The patient is alert and oriented  H. EENT negative  Lungs clear to P&A  Heart regular rhythm no murmurs  Abdomen the palpable organs or masses  Extremities free of edema   Recent Labs  04/05/14 1736 04/06/14 0558  WBC 11.4* 11.0*  HGB 14.3 14.4  HCT 40.2 40.5  PLT 208 194   BMET  Recent Labs  04/06/14 0558 04/06/14 1238  NA 137 135*  K 4.1 3.8  CL 99 95*  CO2 20 26  GLUCOSE 133* 260*  BUN 53* 48*  CREATININE 1.31 1.28  CALCIUM 9.7 9.7    Studies/Results: Dg Chest Port 1 View  04/05/2014   CLINICAL DATA:  Cough with shortness of breath and hyperglycemia.  EXAM: PORTABLE CHEST - 1 VIEW  COMPARISON:  12/10/2013  FINDINGS: Sternotomy wires are unchanged. Lungs are adequately inflated with mild prominence of the perihilar markings likely minimal vascular congestion. There is cardiomegaly improved. Remainder of the exam is unchanged.  IMPRESSION: Mild cardiomegaly with suggestion of minimal vascular congestion.   Electronically Signed   By: Elberta Fortisaniel  Boyle M.D.   On: 04/05/2014 20:35    Medications:  . atorvastatin  10 mg Oral q1800  . carvedilol  25 mg Oral  BID  . dabigatran  150 mg Oral BID  . digoxin  125 mcg Oral Daily  . insulin aspart  0-15 Units Subcutaneous TID WC  . insulin aspart  0-5 Units Subcutaneous QHS  . insulin detemir  14 Units Subcutaneous Q24H  . insulin regular  0-10 Units Intravenous TID WC  . isosorbide mononitrate  30 mg Oral Daily  . pantoprazole  40 mg Oral Daily  . sodium chloride  3 mL Intravenous Q12H    . sodium chloride 1,000 mL (04/07/14 0617)  . insulin (NOVOLIN-R) infusion Stopped (04/06/14 1725)     Assessment/Plan: 1. Diabetes mellitus with hyperglycemia possible ketoacidosis in early stage-plan to continue basal and short-acting insulin. We will continue to monitor chemistries and gradually increase insulin as needed.  2. 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   LOS: 2 days   Kiki Bivens G Zaydenn Balaguer 04/07/2014, 6:23 AM

## 2014-04-07 NOTE — Care Management Note (Signed)
    Page 1 of 1   04/07/2014     4:17:43 PM CARE MANAGEMENT NOTE 04/07/2014  Patient:  [name],[name]   Account Number:  [id number]  Date Initiated:  04/07/2014  Documentation initiated by:  Sharrie RothmanBLACKWELL,Ruthetta Koopmann C  Subjective/Objective Assessment:   Pt admitted from home with DKA. Pt lives with his wife and will return home at discharge. Pt states that he knows all about his DM and treatment and medications.     Action/Plan:   CM discussed HH with pt which he adamantly refuses as he did last admission. Explained to pt that family really would like for him to have HH and he said he did not need anyone telling him what to do.   Anticipated DC Date:  04/10/2014   Anticipated DC Plan:  HOME/SELF CARE      DC Planning Services  CM consult      Choice offered to / List presented to:             Status of service:  Completed, signed off Medicare Important Message given?  YES (If response is "NO", the following Medicare IM given date fields will be blank) Date Medicare IM given:  04/07/2014 Date Additional Medicare IM given:    Discharge Disposition:  HOME/SELF CARE  Per UR Regulation:    If discussed at Long Length of Stay Meetings, dates discussed:    Comments:  04/07/14 1615 Arlyss Queenammy Ramsha Lonigro, RN BSN CM

## 2014-04-08 LAB — GLUCOSE, CAPILLARY
GLUCOSE-CAPILLARY: 315 mg/dL — AB (ref 70–99)
Glucose-Capillary: 237 mg/dL — ABNORMAL HIGH (ref 70–99)
Glucose-Capillary: 353 mg/dL — ABNORMAL HIGH (ref 70–99)
Glucose-Capillary: 278 mg/dL — ABNORMAL HIGH (ref 70–99)

## 2014-04-08 NOTE — Progress Notes (Signed)
Called report to Karl LukeLisa Covington, Charity fundraiserN.  Verbalized understanding.  Pt transferred to rm [id number] in safe and stable condition. Candelaria StagersLeigh Anne Schonewitz 04/08/2014

## 2014-04-08 NOTE — Progress Notes (Signed)
Subjective: The patient is fairly alert this morning. His blood sugars ranged from 237-261. He is not complaining now of anything. His been transitioned to basal insulin and is continued on sliding scale coverage. He does not have any chest pain or shortness of breath.  Objective: Vital signs in last 24 hours: Temp:  [97.5 F (36.4 C)-97.9 F (36.6 C)] 97.6 F (36.4 C) (04/18 0600) Pulse Rate:  [34-97] 79 (04/18 0700) Resp:  [11-21] 17 (04/18 0700) BP: (83-133)/(42-103) 125/56 mmHg (04/18 0700) SpO2:  [99 %-100 %] 100 % (04/18 0700) Weight:  [77.1 kg (169 lb 15.6 oz)] 77.1 kg (169 lb 15.6 oz) (04/18 0600) Weight change: -0.1 kg (-3.5 oz) Last BM Date: 04/06/14  Intake/Output from previous day: 04/17 0701 - 04/18 0700 In: 2280 [P.O.:480; I.V.:1800] Out: 1775 [Urine:1775] Intake/Output this shift:    Physical Exam: The patient is alert and oriented  Lungs clear to P&A  Heart regular rhythm no murmurs  Abdomen no palpable organs or masses  Extremities free of edema   Recent Labs  04/05/14 1736 04/06/14 0558  WBC 11.4* 11.0*  HGB 14.3 14.4  HCT 40.2 40.5  PLT 208 194   BMET  Recent Labs  04/06/14 1238 04/07/14 0421  NA 135* 135*  K 3.8 4.0  CL 95* 99  CO2 26 20  GLUCOSE 260* 245*  BUN 48* 38*  CREATININE 1.28 1.08  CALCIUM 9.7 9.2    Studies/Results: No results found.  Medications:  . atorvastatin  10 mg Oral q1800  . carvedilol  25 mg Oral BID  . dabigatran  150 mg Oral BID  . digoxin  125 mcg Oral Daily  . furosemide  20 mg Oral Daily  . insulin aspart  0-15 Units Subcutaneous TID WC  . insulin aspart  0-5 Units Subcutaneous QHS  . insulin detemir  20 Units Subcutaneous QHS  . isosorbide mononitrate  30 mg Oral Daily  . pantoprazole  40 mg Oral Daily  . sodium chloride  3 mL Intravenous Q12H    . sodium chloride 75 mL/hr at 04/08/14 0700     Assessment/Plan: 1. Diabetes mellitus with hyperglycemia on admission it was felt he might have  ketoacidosis. Plan to discharge from intensive care to MedSurg bed. Continue basal and short-acting insulin with gradual increase in dosages of basal insulin.  2. Cardiomyopathy with chronic combined systolic and diastolic congestive heart failure. Plan to continue Coreg and low-dose furosemide   LOS: 3 days   Yarisa Lynam G Darielle Hancher 04/08/2014, 8:02 AM

## 2014-04-09 LAB — GLUCOSE, CAPILLARY
GLUCOSE-CAPILLARY: 363 mg/dL — AB (ref 70–99)
GLUCOSE-CAPILLARY: 254 mg/dL — AB (ref 70–99)
GLUCOSE-CAPILLARY: 321 mg/dL — AB (ref 70–99)
GLUCOSE-CAPILLARY: 272 mg/dL — AB (ref 70–99)
Glucose-Capillary: 268 mg/dL — ABNORMAL HIGH (ref 70–99)

## 2014-04-09 MED ORDER — INSULIN DETEMIR 100 UNIT/ML ~~LOC~~ SOLN
25.0000 [IU] | Freq: Every day | SUBCUTANEOUS | Status: DC
  Administered 2014-04-09: 25 [IU] via SUBCUTANEOUS
  Filled 2014-04-09: qty 0.25

## 2014-04-09 NOTE — Progress Notes (Signed)
Subjective: The patient had a fairly good night. His blood sugars are coming down some. He has not been dyspneic or shown any respiratory symptoms with gradually increasing his basal insulin and he is on sliding scale coverage. He did not complain of any chest pain or shortness of breath  Objective: Vital signs in last 24 hours: Temp:  [97.4 F (36.3 C)-97.6 F (36.4 C)] 97.5 F (36.4 C) (04/19 0754) Pulse Rate:  [64-70] 70 (04/19 0754) Resp:  [18-21] 18 (04/19 0754) BP: (86-125)/(41-69) 111/61 mmHg (04/19 0754) SpO2:  [98 %-100 %] 100 % (04/19 0754) Weight change:  Last BM Date: 04/06/14  Intake/Output from previous day: 04/18 0701 - 04/19 0700 In: 1800 [P.O.:600; I.V.:1200] Out: 2200 [Urine:2200] Intake/Output this shift:    Physical Exam: The patient is alert and oriented  Lungs are clear to P&A  Heart regular rhythm no murmurs  Abdomen no palpable organs or masses  Extremities free of edema  No results found for this basename: WBC, HGB, HCT, PLT,  in the last 72 hours BMET  Recent Labs  04/06/14 1238 04/07/14 0421  NA 135* 135*  K 3.8 4.0  CL 95* 99  CO2 26 20  GLUCOSE 260* 245*  BUN 48* 38*  CREATININE 1.28 1.08  CALCIUM 9.7 9.2    Studies/Results: No results found.  Medications:  . atorvastatin  10 mg Oral q1800  . carvedilol  25 mg Oral BID  . dabigatran  150 mg Oral BID  . digoxin  125 mcg Oral Daily  . furosemide  20 mg Oral Daily  . insulin aspart  0-15 Units Subcutaneous TID WC  . insulin aspart  0-5 Units Subcutaneous QHS  . insulin detemir  25 Units Subcutaneous QHS  . isosorbide mononitrate  30 mg Oral Daily  . pantoprazole  40 mg Oral Daily  . sodium chloride  3 mL Intravenous Q12H    . sodium chloride 75 mL/hr at 04/08/14 2316     Assessment/Plan: 1. Diabetes mellitus with hyperglycemia on admission Hospital ketoacidosis-plan to continue nasal and short-acting insulin with gradual increase in dosages of basal insulin.  2.  Cardiomyopathy with combined systolic and diastolic congestive heart failure. Plan to continue the same dosage of Coreg low-dose furosemide and monitor chemistries   LOS: 4 days   Yissel Habermehl G Novah Goza 04/09/2014, 8:08 AM

## 2014-04-09 NOTE — Progress Notes (Addendum)
After explaining to the patient the importance of getting his vital signs he allowed me to check them. Will continue to monitor patient.

## 2014-04-10 LAB — BASIC METABOLIC PANEL
BUN: 20 mg/dL (ref 6–23)
CHLORIDE: 100 meq/L (ref 96–112)
CO2: 23 mEq/L (ref 19–32)
Calcium: 9 mg/dL (ref 8.4–10.5)
Creatinine, Ser: 1.06 mg/dL (ref 0.50–1.35)
GFR calc non Af Amer: 71 mL/min — ABNORMAL LOW (ref >90)
GFR, EST AFRICAN AMERICAN: 82 mL/min — AB (ref >90)
Glucose, Bld: 259 mg/dL — ABNORMAL HIGH (ref 70–99)
Potassium: 4.1 mEq/L (ref 3.7–5.3)
SODIUM: 137 meq/L (ref 137–147)

## 2014-04-10 LAB — GLUCOSE, CAPILLARY: GLUCOSE-CAPILLARY: 234 mg/dL — AB (ref 70–99)

## 2014-04-10 NOTE — Progress Notes (Signed)
Pt to be discharged home per Dr. Renard MatterMcInnis. Pt's IV site D/C'd and WNL. Pt's VSS. Discharge paperwork printed and upon entering patient's room to go over paper, noted patient had already left hospital without telling anyone. Message left on patient's answering machine with instructions to call if he has any questions regarding discharge instructions or if he wants to come and pick up paperwork, will be left at desk with secretary.
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
## 2014-04-13 ENCOUNTER — Ambulatory Visit (INDEPENDENT_AMBULATORY_CARE_PROVIDER_SITE_OTHER): Payer: Medicare Other | Admitting: Internal Medicine

## 2014-05-03 ENCOUNTER — Inpatient Hospital Stay (HOSPITAL_COMMUNITY)
Admission: EM | Admit: 2014-05-03 | Discharge: 2014-05-06 | DRG: 640 | Disposition: A | Discharge status: Home / Self Care | Payer: Medicare Other | Attending: Family Medicine | Admitting: Family Medicine

## 2014-05-03 ENCOUNTER — Encounter (HOSPITAL_COMMUNITY): Payer: Self-pay | Admitting: Emergency Medicine

## 2014-05-03 DIAGNOSIS — E119 Type 2 diabetes mellitus without complications: Secondary | ICD-10-CM | POA: Diagnosis present

## 2014-05-03 DIAGNOSIS — I10 Essential (primary) hypertension: Secondary | ICD-10-CM | POA: Diagnosis present

## 2014-05-03 DIAGNOSIS — E1110 Type 2 diabetes mellitus with ketoacidosis without coma: Secondary | ICD-10-CM | POA: Diagnosis present

## 2014-05-03 DIAGNOSIS — I9589 Other hypotension: Secondary | ICD-10-CM | POA: Diagnosis present

## 2014-05-03 DIAGNOSIS — I2510 Atherosclerotic heart disease of native coronary artery without angina pectoris: Secondary | ICD-10-CM | POA: Diagnosis present

## 2014-05-03 DIAGNOSIS — E1310 Other specified diabetes mellitus with ketoacidosis without coma: Secondary | ICD-10-CM | POA: Diagnosis present

## 2014-05-03 DIAGNOSIS — R55 Syncope and collapse: Secondary | ICD-10-CM | POA: Diagnosis present

## 2014-05-03 DIAGNOSIS — W19XXXA Unspecified fall, initial encounter: Secondary | ICD-10-CM | POA: Diagnosis present

## 2014-05-03 DIAGNOSIS — I509 Heart failure, unspecified: Secondary | ICD-10-CM | POA: Diagnosis present

## 2014-05-03 DIAGNOSIS — F17200 Nicotine dependence, unspecified, uncomplicated: Secondary | ICD-10-CM | POA: Diagnosis present

## 2014-05-03 DIAGNOSIS — I739 Peripheral vascular disease, unspecified: Secondary | ICD-10-CM | POA: Diagnosis present

## 2014-05-03 DIAGNOSIS — I498 Other specified cardiac arrhythmias: Secondary | ICD-10-CM | POA: Diagnosis present

## 2014-05-03 DIAGNOSIS — I5042 Chronic combined systolic (congestive) and diastolic (congestive) heart failure: Secondary | ICD-10-CM | POA: Diagnosis present

## 2014-05-03 DIAGNOSIS — Y92009 Unspecified place in unspecified non-institutional (private) residence as the place of occurrence of the external cause: Secondary | ICD-10-CM

## 2014-05-03 DIAGNOSIS — E860 Dehydration: Principal | ICD-10-CM | POA: Diagnosis present

## 2014-05-03 DIAGNOSIS — Z7901 Long term (current) use of anticoagulants: Secondary | ICD-10-CM

## 2014-05-03 DIAGNOSIS — Z794 Long term (current) use of insulin: Secondary | ICD-10-CM

## 2014-05-03 DIAGNOSIS — Z9119 Patient's noncompliance with other medical treatment and regimen: Secondary | ICD-10-CM

## 2014-05-03 DIAGNOSIS — Z9861 Coronary angioplasty status: Secondary | ICD-10-CM

## 2014-05-03 DIAGNOSIS — Z951 Presence of aortocoronary bypass graft: Secondary | ICD-10-CM

## 2014-05-03 DIAGNOSIS — Z8249 Family history of ischemic heart disease and other diseases of the circulatory system: Secondary | ICD-10-CM

## 2014-05-03 DIAGNOSIS — IMO0002 Reserved for concepts with insufficient information to code with codable children: Secondary | ICD-10-CM | POA: Diagnosis present

## 2014-05-03 DIAGNOSIS — E785 Hyperlipidemia, unspecified: Secondary | ICD-10-CM | POA: Diagnosis present

## 2014-05-03 DIAGNOSIS — Z91199 Patient's noncompliance with other medical treatment and regimen due to unspecified reason: Secondary | ICD-10-CM

## 2014-05-03 DIAGNOSIS — R0789 Other chest pain: Secondary | ICD-10-CM

## 2014-05-03 DIAGNOSIS — D72829 Elevated white blood cell count, unspecified: Secondary | ICD-10-CM | POA: Diagnosis present

## 2014-05-03 DIAGNOSIS — I4891 Unspecified atrial fibrillation: Secondary | ICD-10-CM | POA: Diagnosis present

## 2014-05-03 DIAGNOSIS — I959 Hypotension, unspecified: Secondary | ICD-10-CM

## 2014-05-03 DIAGNOSIS — I952 Hypotension due to drugs: Secondary | ICD-10-CM | POA: Diagnosis present

## 2014-05-03 DIAGNOSIS — F079 Unspecified personality and behavioral disorder due to known physiological condition: Secondary | ICD-10-CM | POA: Diagnosis present

## 2014-05-03 LAB — LACTIC ACID, PLASMA: LACTIC ACID, VENOUS: 1.7 mmol/L (ref 0.5–2.2)

## 2014-05-03 LAB — URINALYSIS, ROUTINE W REFLEX MICROSCOPIC
Bilirubin Urine: NEGATIVE
Glucose, UA: 500 mg/dL — AB
Hgb urine dipstick: NEGATIVE
Ketones, ur: NEGATIVE mg/dL
LEUKOCYTES UA: NEGATIVE
Nitrite: NEGATIVE
PH: 5.5 (ref 5.0–8.0)
PROTEIN: NEGATIVE mg/dL
SPECIFIC GRAVITY, URINE: 1.025 (ref 1.005–1.030)
Urobilinogen, UA: 0.2 mg/dL (ref 0.0–1.0)

## 2014-05-03 LAB — CBC WITH DIFFERENTIAL/PLATELET
BASOS ABS: 0.1 10*3/uL (ref 0.0–0.1)
Basophils Relative: 1 % (ref 0–1)
EOS ABS: 0.2 10*3/uL (ref 0.0–0.7)
Eosinophils Relative: 2 % (ref 0–5)
HCT: 40.6 % (ref 39.0–52.0)
Hemoglobin: 14.5 g/dL (ref 13.0–17.0)
Lymphocytes Relative: 14 % (ref 12–46)
Lymphs Abs: 1.8 10*3/uL (ref 0.7–4.0)
MCH: 33.1 pg (ref 26.0–34.0)
MCHC: 35.7 g/dL (ref 30.0–36.0)
MCV: 92.7 fL (ref 78.0–100.0)
Monocytes Absolute: 0.9 10*3/uL (ref 0.1–1.0)
Monocytes Relative: 7 % (ref 3–12)
NEUTROS ABS: 9.9 10*3/uL — AB (ref 1.7–7.7)
NEUTROS PCT: 76 % (ref 43–77)
Platelets: 206 10*3/uL (ref 150–400)
RBC: 4.38 MIL/uL (ref 4.22–5.81)
RDW: 13.1 % (ref 11.5–15.5)
WBC: 12.9 10*3/uL — ABNORMAL HIGH (ref 4.0–10.5)

## 2014-05-03 LAB — COMPREHENSIVE METABOLIC PANEL
ALT: 18 U/L (ref 0–53)
AST: 17 U/L (ref 0–37)
Albumin: 3.8 g/dL (ref 3.5–5.2)
Alkaline Phosphatase: 70 U/L (ref 39–117)
BILIRUBIN TOTAL: 0.5 mg/dL (ref 0.3–1.2)
BUN: 29 mg/dL — ABNORMAL HIGH (ref 6–23)
CHLORIDE: 98 meq/L (ref 96–112)
CO2: 23 mEq/L (ref 19–32)
CREATININE: 1.28 mg/dL (ref 0.50–1.35)
Calcium: 10.1 mg/dL (ref 8.4–10.5)
GFR calc Af Amer: 65 mL/min — ABNORMAL LOW (ref >90)
GFR calc non Af Amer: 56 mL/min — ABNORMAL LOW (ref >90)
Glucose, Bld: 146 mg/dL — ABNORMAL HIGH (ref 70–99)
POTASSIUM: 4.3 meq/L (ref 3.7–5.3)
Sodium: 135 mEq/L — ABNORMAL LOW (ref 137–147)
TOTAL PROTEIN: 7.5 g/dL (ref 6.0–8.3)

## 2014-05-03 LAB — TROPONIN I

## 2014-05-03 MED ORDER — SODIUM CHLORIDE 0.9 % IV BOLUS (SEPSIS)
1000.0000 mL | Freq: Once | INTRAVENOUS | Status: AC
  Administered 2014-05-03: 1000 mL via INTRAVENOUS

## 2014-05-03 MED ORDER — SODIUM CHLORIDE 0.9 % IV BOLUS (SEPSIS): 1000.0000 mL | Freq: Once | INTRAVENOUS | Status: DC
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
## 2014-05-03 NOTE — ED Notes (Addendum)
Pt reports that he became dizzy this afternoon. Denies CP, SOB. No unilateral weakness observed. Per EMS pt was hypotensive and bradycardic.

## 2014-05-04 ENCOUNTER — Encounter (HOSPITAL_COMMUNITY): Payer: Self-pay | Admitting: Internal Medicine

## 2014-05-04 DIAGNOSIS — Z7901 Long term (current) use of anticoagulants: Secondary | ICD-10-CM

## 2014-05-04 DIAGNOSIS — I059 Rheumatic mitral valve disease, unspecified: Secondary | ICD-10-CM

## 2014-05-04 DIAGNOSIS — I5042 Chronic combined systolic (congestive) and diastolic (congestive) heart failure: Secondary | ICD-10-CM

## 2014-05-04 DIAGNOSIS — R55 Syncope and collapse: Secondary | ICD-10-CM | POA: Diagnosis present

## 2014-05-04 DIAGNOSIS — I952 Hypotension due to drugs: Secondary | ICD-10-CM | POA: Diagnosis present

## 2014-05-04 DIAGNOSIS — R0789 Other chest pain: Secondary | ICD-10-CM

## 2014-05-04 DIAGNOSIS — I509 Heart failure, unspecified: Secondary | ICD-10-CM

## 2014-05-04 DIAGNOSIS — I4891 Unspecified atrial fibrillation: Secondary | ICD-10-CM

## 2014-05-04 DIAGNOSIS — I959 Hypotension, unspecified: Secondary | ICD-10-CM

## 2014-05-04 LAB — GLUCOSE, CAPILLARY
GLUCOSE-CAPILLARY: 310 mg/dL — AB (ref 70–99)
GLUCOSE-CAPILLARY: 266 mg/dL — AB (ref 70–99)
GLUCOSE-CAPILLARY: 247 mg/dL — AB (ref 70–99)
Glucose-Capillary: 180 mg/dL — ABNORMAL HIGH (ref 70–99)
Glucose-Capillary: 181 mg/dL — ABNORMAL HIGH (ref 70–99)

## 2014-05-04 LAB — TROPONIN I
Troponin I: <0.3 ng/mL (ref <0.30)
Troponin I: <0.3 ng/mL (ref <0.30)

## 2014-05-04 LAB — DIGOXIN LEVEL: Digoxin Level: 0.6 ng/mL — ABNORMAL LOW (ref 0.8–2.0)

## 2014-05-04 LAB — CBC
HEMATOCRIT: 37.2 % — AB (ref 39.0–52.0)
Hemoglobin: 13 g/dL (ref 13.0–17.0)
MCH: 32.5 pg (ref 26.0–34.0)
MCHC: 34.9 g/dL (ref 30.0–36.0)
MCV: 93 fL (ref 78.0–100.0)
PLATELETS: 180 10*3/uL (ref 150–400)
RBC: 4 MIL/uL — ABNORMAL LOW (ref 4.22–5.81)
RDW: 13.2 % (ref 11.5–15.5)
WBC: 10.8 10*3/uL — ABNORMAL HIGH (ref 4.0–10.5)

## 2014-05-04 LAB — BASIC METABOLIC PANEL
BUN: 26 mg/dL — AB (ref 6–23)
CHLORIDE: 103 meq/L (ref 96–112)
CO2: 22 meq/L (ref 19–32)
CREATININE: 1.09 mg/dL (ref 0.50–1.35)
Calcium: 8.8 mg/dL (ref 8.4–10.5)
GFR calc Af Amer: 79 mL/min — ABNORMAL LOW (ref >90)
GFR calc non Af Amer: 68 mL/min — ABNORMAL LOW (ref >90)
Glucose, Bld: 210 mg/dL — ABNORMAL HIGH (ref 70–99)
Potassium: 3.9 mEq/L (ref 3.7–5.3)
Sodium: 138 mEq/L (ref 137–147)

## 2014-05-04 LAB — TSH: TSH: 1.15 u[IU]/mL (ref 0.350–4.500)

## 2014-05-04 MED ORDER — DABIGATRAN ETEXILATE MESYLATE 150 MG PO CAPS
150.0000 mg | ORAL_CAPSULE | Freq: Two times a day (BID) | ORAL | Status: DC
  Administered 2014-05-04 – 2014-05-05 (×4): 150 mg via ORAL
  Filled 2014-05-04 (×9): qty 1

## 2014-05-04 MED ORDER — POTASSIUM CHLORIDE CRYS ER 20 MEQ PO TBCR
20.0000 meq | EXTENDED_RELEASE_TABLET | Freq: Two times a day (BID) | ORAL | Status: DC
  Administered 2014-05-04 – 2014-05-05 (×4): 20 meq via ORAL
  Filled 2014-05-04 (×4): qty 1

## 2014-05-04 MED ORDER — INSULIN ASPART 100 UNIT/ML ~~LOC~~ SOLN
0.0000 [IU] | Freq: Three times a day (TID) | SUBCUTANEOUS | Status: DC
  Administered 2014-05-04: 8 [IU] via SUBCUTANEOUS
  Administered 2014-05-04: 11 [IU] via SUBCUTANEOUS
  Administered 2014-05-04: 3 [IU] via SUBCUTANEOUS
  Administered 2014-05-05: 5 [IU] via SUBCUTANEOUS
  Administered 2014-05-05: 3 [IU] via SUBCUTANEOUS
  Administered 2014-05-05: 8 [IU] via SUBCUTANEOUS

## 2014-05-04 MED ORDER — ASPIRIN EC 325 MG PO TBEC
325.0000 mg | DELAYED_RELEASE_TABLET | Freq: Every day | ORAL | Status: DC
  Administered 2014-05-04 – 2014-05-05 (×2): 325 mg via ORAL
  Filled 2014-05-04 (×2): qty 1

## 2014-05-04 MED ORDER — INSULIN ASPART 100 UNIT/ML ~~LOC~~ SOLN
4.0000 [IU] | Freq: Three times a day (TID) | SUBCUTANEOUS | Status: DC
  Administered 2014-05-04 – 2014-05-05 (×4): 4 [IU] via SUBCUTANEOUS

## 2014-05-04 MED ORDER — CARVEDILOL 3.125 MG PO TABS
6.2500 mg | ORAL_TABLET | Freq: Two times a day (BID) | ORAL | Status: DC
  Administered 2014-05-05: 6.25 mg via ORAL
  Filled 2014-05-04 (×2): qty 2

## 2014-05-04 MED ORDER — SODIUM CHLORIDE 0.9 % IJ SOLN
3.0000 mL | Freq: Two times a day (BID) | INTRAMUSCULAR | Status: DC
  Administered 2014-05-04 – 2014-05-05 (×3): 3 mL via INTRAVENOUS

## 2014-05-04 MED ORDER — INSULIN ASPART 100 UNIT/ML ~~LOC~~ SOLN
0.0000 [IU] | Freq: Every day | SUBCUTANEOUS | Status: DC
  Administered 2014-05-04 – 2014-05-05 (×2): 2 [IU] via SUBCUTANEOUS

## 2014-05-04 MED ORDER — INSULIN DETEMIR 100 UNIT/ML ~~LOC~~ SOLN
14.0000 [IU] | SUBCUTANEOUS | Status: DC
  Administered 2014-05-04 – 2014-05-05 (×2): 14 [IU] via SUBCUTANEOUS
  Filled 2014-05-04 (×4): qty 0.14

## 2014-05-04 MED ORDER — SODIUM CHLORIDE 0.9 % IV SOLN
INTRAVENOUS | Status: DC
  Administered 2014-05-04 (×2): via INTRAVENOUS

## 2014-05-04 MED ORDER — ATORVASTATIN CALCIUM 10 MG PO TABS
10.0000 mg | ORAL_TABLET | Freq: Every day | ORAL | Status: DC
  Administered 2014-05-04 – 2014-05-05 (×2): 10 mg via ORAL
  Filled 2014-05-04 (×2): qty 1

## 2014-05-04 NOTE — H&P (Signed)
[facility] History and Physical  JAQUALIN SERPA AVW:[id number] DOB: 1946/06/15    PCP:   Alice Reichert, MD   Chief Complaint: syncope.  HPI: [name] is an 68 y.o. male with hx of HTN on several antihypertensive meds, hx of known CAD, s/p urgent CABG 95, hx of afib on Pradaxa, tobacco abuse, prior hx of syncope, hyperlipidemia,, DM2, chronic CHF, and noncompliance, brought to the ER as he as was falling, and found that he actually had a syncopal episode.  He denied CP, palpitation, but did have lightheadedness.  He had profuse diarrhea without balck or bloody stool.  In the ER, he was found to be very unkempt, with long finger nails, dried up stool on his legs and feet.  He was found to be hypotensive, with SBP of 70's.  He was given IVF and was able to [facility].  Foley was placed with good urine output eventually.  His labs showed normal Hb, renal fx tests, electrolytes, and he has a leukocytosis with WBC of 12.9 K.  Hospitalist was asked to admit him for syncope work up and to rehydrate him.  Rewiew of Systems:  Constitutional: Negative for malaise, fever and chills. No significant weight loss or weight gain Eyes: Negative for eye pain, redness and discharge, diplopia, visual changes, or flashes of light. ENMT: Negative for ear pain, hoarseness, nasal congestion, sinus pressure and sore throat. No headaches; tinnitus, drooling, or problem swallowing. Cardiovascular: Negative for chest pain, palpitations, diaphoresis, dyspnea and peripheral edema. ; No orthopnea, PND Respiratory: Negative for cough, hemoptysis, wheezing and stridor. No pleuritic chestpain. Gastrointestinal: Negative for nausea, vomiting, diarrhea, constipation, abdominal pain, melena, blood in stool, hematemesis, jaundice and rectal bleeding.    Genitourinary: Negative for frequency, dysuria, incontinence,flank pain and hematuria; Musculoskeletal: Negative for back pain and neck pain. Negative for swelling and  trauma.;  Skin: . Negative for pruritus, rash, abrasions, bruising and skin lesion.; ulcerations Neuro: Negative for headache, and neck stiffness. Negative for weakness,  extremity weakness, burning feet, involuntary movement, seizure and syncope.  Psych: negative for anxiety, depression, insomnia, tearfulness, panic attacks, hallucinations, paranoia, suicidal or homicidal ideation    Past Medical History  Diagnosis Date  . Coronary atherosclerosis of native coronary artery     Ant. MI in 4/95; PTCA of 90% prox & distal LAD unsuccessful-->  Urgent CABG-4/95 TO Cx; 50% RCA; ant. HK; EF of 50-55%; 08/2008: 3-V disease plus a  95% LIMA stenosis, treated with DES; occlusion of SVG to CX; RCA SVG stenosis--> PCI with DES  2010: in-stent restenosis in the LIMA-->cutting balloon; EF of 35-40%;  07/2010: TO of SVG to     RCA-medical therapy advised; EF of 30-35% by echo in 12/2010  . Atrial fibrillation 1995    1995, 2009; bradycardia with beta blocker therapy  . Tobacco abuse     40-50 pack years; currently one pack per day  . Peripheral vascular disease 1995    Abdominal aortic obstruction 1995-->vascular surgery  . Hyperlipidemia   . Type 2 diabetes mellitus   . DDD (degenerative disc disease)     Discectomy and fusion at L4 and L5  . Alcohol use 1999    Excessive alcohol use- quit in 1999  . Chronic systolic heart failure     LVEF 25-30% 2011  . Non-compliance     Past Surgical History  Procedure Laterality Date  . Cholecystectomy  1995  . Lumbar fusion      +Discectomy;x2; L4 and L5  .  Aorto-femoral bypass graft  1995    Medications:  HOME MEDS: Prior to Admission medications   Medication Sig Start Date End Date Taking? Authorizing Provider  carvedilol (COREG) 25 MG tablet Take 25 mg by mouth 2 (two) times daily.   Yes Historical Provider, MD  dabigatran (PRADAXA) 150 MG CAPS capsule Take 150 mg by mouth 2 (two) times daily.   Yes Historical Provider, MD  digoxin (LANOXIN) 0.125  MG tablet Take 125 mcg by mouth daily.     Yes Historical Provider, MD  furosemide (LASIX) 40 MG tablet Take 40 mg by mouth 2 (two) times daily. Take 40 mg every other day. 01/30/14  Yes Antoine PocheJonathan F Branch, MD  insulin aspart (NOVOLOG FLEXPEN) 100 UNIT/ML FlexPen Inject 15-20 Units into the skin 3 (three) times daily with meals.    Yes Historical Provider, MD  insulin detemir (LEVEMIR) 100 UNIT/ML injection Inject 25 Units into the skin every morning.    Yes Historical Provider, MD  isosorbide mononitrate (IMDUR) 30 MG 24 hr tablet Take 1 tablet (30 mg total) by mouth daily. 09/08/13  Yes Angus Edilia BoG [name], MD  lisinopril (PRINIVIL,ZESTRIL) 5 MG tablet Take 1 tablet (5 mg total) by mouth daily. 02/28/14  Yes Antoine PocheJonathan F Branch, MD  nitroGLYCERIN (NITROSTAT) 0.4 MG SL tablet Place 1 tablet (0.4 mg total) under the tongue every 5 (five) minutes as needed. 05/24/12  Yes Kathlen Brunswickobert M Rothbart, MD  potassium chloride SA (K-DUR,KLOR-CON) 20 MEQ tablet Take 1 tablet (20 mEq total) by mouth 2 (two) times daily. 12/27/13  Yes Jodelle GrossKathryn M Lawrence, NP  rosuvastatin (CRESTOR) 40 MG tablet Take 40 mg by mouth daily.   Yes Historical Provider, MD     Allergies:  No Known Allergies  Social History:   reports that he has been smoking Cigarettes.  He has been smoking about 0.50 packs per day. He uses smokeless tobacco. He reports that he does not drink alcohol or use illicit drugs.  Family History: Family History  Problem Relation Age of Onset  . CAD Father      Physical Exam: Filed Vitals:   05/03/14 1857 05/03/14 2000 05/03/14 2157 05/03/14 2230  BP: 87/59 102/71 92/54 93/53   Pulse: 62 75 69 64  Temp:   97.8 F (36.6 C)   TempSrc:   Oral   Resp: 26 23 17    Height: 5\' 9"  (1.753 m)     Weight: 75.751 kg (167 lb)     SpO2: 97% 100% 99% 100%   Blood pressure 93/53, pulse 64, temperature 97.8 F (36.6 C), temperature source Oral, resp. rate 17, height 5\' 9"  (1.753 m), weight 75.751 kg (167 lb), SpO2  100.00%.  GEN:  Pleasant  patient lying in the stretcher in no acute distress; cooperative with exam. PSYCH:  alert and oriented x4; does not appear anxious or depressed; affect is appropriate. HEENT: Mucous membranes pink and anicteric; PERRLA; EOM intact; no cervical lymphadenopathy nor thyromegaly or carotid bruit; no JVD; There were no stridor. Neck is very supple. Breasts:: Not examined CHEST WALL: No tenderness CHEST: Normal respiration, clear to auscultation bilaterally.  HEART: Regular rate and rhythm.  There are no murmur, rub, or gallops.   BACK: No kyphosis or scoliosis; no CVA tenderness ABDOMEN: soft and non-tender; no masses, no organomegaly, normal abdominal bowel sounds; no pannus; no intertriginous candida. There is no rebound and no distention. Rectal Exam: Not done EXTREMITIES: No bone or joint deformity; age-appropriate arthropathy of the hands and knees; no edema; no ulcerations.  There is no calf tenderness. Genitalia: not examined PULSES: 2+ and symmetric SKIN: Normal hydration no rash or ulceration CNS: Cranial nerves 2-12 grossly intact no focal lateralizing neurologic deficit.  Speech is fluent; uvula elevated with phonation, facial symmetry and tongue midline. DTR are normal bilaterally, cerebella exam is intact, barbinski is negative and strengths are equaled bilaterally.  No sensory loss.   Labs on Admission:  Basic Metabolic Panel:  Recent Labs Lab 05/03/14 2021  NA 135*  K 4.3  CL 98  CO2 23  GLUCOSE 146*  BUN 29*  CREATININE 1.28  CALCIUM 10.1   Liver Function Tests:  Recent Labs Lab 05/03/14 2021  AST 17  ALT 18  ALKPHOS 70  BILITOT 0.5  PROT 7.5  ALBUMIN 3.8   No results found for this basename: LIPASE, AMYLASE,  in the last 168 hours No results found for this basename: AMMONIA,  in the last 168 hours CBC:  Recent Labs Lab 05/03/14 2021  WBC 12.9*  NEUTROABS 9.9*  HGB 14.5  HCT 40.6  MCV 92.7  PLT 206   Cardiac  Enzymes:  Recent Labs Lab 05/03/14 2021  TROPONINI <0.30    CBG: No results found for this basename: GLUCAP,  in the last 168 hours   Radiological Exams on Admission: No results found.  Assessment/Plan Present on Admission:  . Fall . Atrial fibrillation . DIABETES MELLITUS, TYPE II . Chronic combined systolic and diastolic CHF, NYHA class 3 . Hyperlipidemia . PERIPHERAL VASCULAR DISEASE . DKA, type 2 . Hypotension due to drugs . Syncope and collapse  PLAN:  His lightheadedness and syncope I think is from severe hypotension and volume depletion.  Will admit him for OBS, give  IVF, and hold his BP meds. WIll obtain a cardiac ECHO.  Will be careful with IVF, as he has hx of CHF.  For his Afib, will continue with his anticoagualtion.  He is on dig, so will check a level.  For his DM, will continue with SSI.  He is otherwise stable, full code, [facility]    Other plans as per orders.  Code Status: FULL CODE,   Houston SirenPeter Larah Kuntzman, MD. [facility] Pager [phone] 7pm to 7am.  05/04/2014, 12:09 AM

## 2014-05-04 NOTE — Progress Notes (Signed)
Subjective: The patient is currently comfortable. He was admitted following episode of syncope and hypotension. His blood pressure has remained stable since admission but currently 98/41  Objective: Vital signs in last 24 hours: Temp:  [97.3 F (36.3 C)-98.5 F (36.9 C)] 98.5 F (36.9 C) (05/14 1527) Pulse Rate:  [62-75] 75 (05/14 1527) Resp:  [17-26] 20 (05/14 1527) BP: (87-127)/(41-73) 98/41 mmHg (05/14 1527) SpO2:  [97 %-100 %] 100 % (05/14 1527) Weight:  [74.4 kg (164 lb 0.4 oz)-75.751 kg (167 lb)] 74.4 kg (164 lb 0.4 oz) (05/14 0207) Weight change:  Last BM Date: 05/04/14  Intake/Output from previous day: 05/13 0701 - 05/14 0700 In: -  Out: 300 [Urine:300] Intake/Output this shift: Total I/O In: 1715 [P.O.:240; I.V.:1475] Out: -   Physical Exam: General appearance alert male with no complaints  H. EENT negative  Neck supple no JVD or thyroid abnormalities  Lungs clear to P&A  Heart irregular rhythm no murmurs  Abdomen no palpable organs or masses  Extremities no edema   Recent Labs  05/03/14 2021 05/04/14 0532  WBC 12.9* 10.8*  HGB 14.5 13.0  HCT 40.6 37.2*  PLT 206 180   BMET  Recent Labs  05/03/14 2021 05/04/14 0532  NA 135* 138  K 4.3 3.9  CL 98 103  CO2 23 22  GLUCOSE 146* 210*  BUN 29* 26*  CREATININE 1.28 1.09  CALCIUM 10.1 8.8    Studies/Results: No results found.  Medications:  . aspirin EC  325 mg Oral Daily  . atorvastatin  10 mg Oral q1800  . carvedilol  6.25 mg Oral BID WC  . dabigatran  150 mg Oral BID  . insulin aspart  0-15 Units Subcutaneous TID WC  . insulin aspart  0-5 Units Subcutaneous QHS  . insulin aspart  4 Units Subcutaneous TID WC  . insulin detemir  14 Units Subcutaneous Q24H  . potassium chloride SA  20 mEq Oral BID  . sodium chloride  1,000 mL Intravenous Once  . sodium chloride  3 mL Intravenous Q12H    . sodium chloride 100 mL/hr at 05/04/14 1207     Assessment/Plan: 1. Syncopal episode,  hypotension-IV fluids continue to hold blood pressure medications continue to monitor 2. Chronic atrial fibrillation-continue current meds cardiology consult 3. Diabetes mellitus type 2-continue diabetic diet continue to monitor sugars and give short-acting insulin as needed 4. Systolic and diastolic -temporary discontinued Lasix continue other meds as listed above   LOS: 1 day   Orian Amberg G Macenzie Burford 05/04/2014, 4:56 PM

## 2014-05-04 NOTE — Progress Notes (Signed)
Utilization Review Complete  

## 2014-05-04 NOTE — Progress Notes (Signed)
Patient's heart rate was in the 30s on telemetry while patient was sleeping. Patient awakened to verbal cue and was asymptomatic. His heart rate is currently 60s-70s on telemetry while awake. Patient also had an 11-beat run of V-tach today. Dr. Wyline MoodBranch was notified and he ordered a hold on evening dose of Coreg. Will continue to monitor patient for symptomatic bradycardia.

## 2014-05-04 NOTE — Progress Notes (Signed)
Inpatient Diabetes Program Recommendations  AACE/ADA: New Consensus Statement on Inpatient Glycemic Control (2013)  Target Ranges:  Prepandial:   less than 140 mg/dL      Peak postprandial:   less than 180 mg/dL (1-2 hours)      Critically ill patients:  140 - 180 mg/dL   Results for [name], [name] (MRN [id number]) as of 05/04/2014 08:15  Ref. Range 05/03/2014 20:21 05/04/2014 05:32  Glucose Latest Range: 70-99 mg/dL 132146 (H) 440210 (H)   Diabetes history: DM2 Outpatient Diabetes medications: Lantus 25 units QAM, Novolog 15-20 units TID with meals Current orders for Inpatient glycemic control: Novolog 0-15 units AC, Novolog 0-5 units HS  Inpatient Diabetes Program Recommendations Insulin - Basal: Please consider ordering Levemir 14 units QAM (start now). Insulin - Meal Coverage: Please consider ordering Novolog 4 units TID with meals for meal coverage.  Thanks, Orlando PennerMarie Emmali Karow, RN, MSN, CCRN Diabetes Coordinator Inpatient Diabetes Program [phone] (Team Pager) [phone] ([facility] office) [phone] [facility] office)

## 2014-05-04 NOTE — Progress Notes (Signed)
  Echocardiogram 2D Echocardiogram has been performed.  [name] 05/04/2014, 12:09 PM
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
## 2014-05-05 LAB — GLUCOSE, CAPILLARY
GLUCOSE-CAPILLARY: 172 mg/dL — AB (ref 70–99)
Glucose-Capillary: 266 mg/dL — ABNORMAL HIGH (ref 70–99)
Glucose-Capillary: 213 mg/dL — ABNORMAL HIGH (ref 70–99)
Glucose-Capillary: 227 mg/dL — ABNORMAL HIGH (ref 70–99)

## 2014-05-05 NOTE — Progress Notes (Signed)
PT Cancellation Note  Patient Details Name: [name] MRN: [id number] DOB: 1946/11/27   Cancelled Treatment:    Reason Eval/Treat Not Completed: Fatigue/lethargy limiting ability to participate   [name] 05/05/2014, 10:27 AM

## 2014-05-05 NOTE — Progress Notes (Signed)
Subjective: The patient is currently comfortable and relatively asymptomatic. His heart rate has been in the high 30s intermittently through the night and his had short runs of V. tach. Blood pressures remained relatively normal.  Objective: Vital signs in last 24 hours: Temp:  [98.3 F (36.8 C)-98.5 F (36.9 C)] 98.3 F (36.8 C) (05/14 2100) Pulse Rate:  [61-75] 61 (05/14 2100) Resp:  [20] 20 (05/14 2100) BP: (98-141)/(41-54) 141/54 mmHg (05/14 2100) SpO2:  [99 %-100 %] 99 % (05/14 2100) Weight change:  Last BM Date: 05/04/14  Intake/Output from previous day: 05/14 0701 - 05/15 0700 In: 2195 [P.O.:720; I.V.:1475] Out: 1550 [Urine:1550] Intake/Output this shift: Total I/O In: -  Out: 700 [Urine:700]  Physical Exam: General appearance alert male with no complaints  H. EENT negative  Neck supple no JVD or thyroid abnormalities  Lungs clear to P&A  Heart irregular rhythm no murmurs  Abdomen the palpable organs or masses  Extremities free of edema   Recent Labs  05/03/14 2021 05/04/14 0532  WBC 12.9* 10.8*  HGB 14.5 13.0  HCT 40.6 37.2*  PLT 206 180   BMET  Recent Labs  05/03/14 2021 05/04/14 0532  NA 135* 138  K 4.3 3.9  CL 98 103  CO2 23 22  GLUCOSE 146* 210*  BUN 29* 26*  CREATININE 1.28 1.09  CALCIUM 10.1 8.8    Studies/Results: No results found.  Medications:  . aspirin EC  325 mg Oral Daily  . atorvastatin  10 mg Oral q1800  . carvedilol  6.25 mg Oral BID WC  . dabigatran  150 mg Oral BID  . insulin aspart  0-15 Units Subcutaneous TID WC  . insulin aspart  0-5 Units Subcutaneous QHS  . insulin aspart  4 Units Subcutaneous TID WC  . insulin detemir  14 Units Subcutaneous Q24H  . potassium chloride SA  20 mEq Oral BID  . sodium chloride  1,000 mL Intravenous Once  . sodium chloride  3 mL Intravenous Q12H    . sodium chloride 100 mL/hr at 05/04/14 2141     Assessment/Plan: 1. Episode of syncope prior to admission, hypotension and  episodes of bradycardia in hospital setting plan to continue current medication patient will follow with cardiology today 2. Chronic atrial fibrillation controlled rate continue current meds 3. Diabetes mellitus type 2-continued diabetic diet continue Levemir insulin and short-acting insulin 4. Systolic and diastolic CHF-continue to monitor patient's weights may need to reinstitute Lasix was being held    LOS: 2 days   Gid Schoffstall G Taejon Irani 05/05/2014, 6:14 AM

## 2014-05-05 NOTE — Progress Notes (Signed)
Consulting cardiologist: Dina RichBranch, Orlinda Slomski MD Primary Cardiologist: Dina RichBranch, Naileah Karg MD  Subjective:    No complaints of chest pain or near syncope. Has been out of the bed without symptoms.   Objective:   Temp:  [98 F (36.7 C)-98.5 F (36.9 C)] 98 F (36.7 C) (05/15 0650) Pulse Rate:  [61-75] 72 (05/15 0650) Resp:  [20] 20 (05/15 0650) BP: (98-141)/(41-76) 132/76 mmHg (05/15 0650) SpO2:  [99 %-100 %] 99 % (05/15 0650) Last BM Date: 05/04/14  Filed Weights   05/03/14 1857 05/04/14 0207  Weight: 167 lb (75.751 kg) 164 lb 0.4 oz (74.4 kg)    Intake/Output Summary (Last 24 hours) at 05/05/14 0927 Last data filed at 05/05/14 0755  Gross per 24 hour  Intake   1955 ml  Output   2350 ml  Net   -395 ml    Telemetry: No V-tach over last 24 hours on review of telemetry. Atrial fib,  bradycardia in the 40's and 50;s.   Exam:  General: No acute distress.  HEENT: Conjunctiva and lids normal, oropharynx clear.  Lungs: Clear to auscultation, nonlabored.  Cardiac: No elevated JVP or bruits. RRR, no gallop or rub.   Abdomen: Normoactive bowel sounds, nontender, nondistended.  Extremities: No pitting edema, distal pulses full.Nail beds with fecal material.  Neuropsychiatric: Alert and oriented x3, affect appropriate.   Lab Results:  Basic Metabolic Panel:  Recent Labs Lab 05/03/14 2021 05/04/14 0532  NA 135* 138  K 4.3 3.9  CL 98 103  CO2 23 22  GLUCOSE 146* 210*  BUN 29* 26*  CREATININE 1.28 1.09  CALCIUM 10.1 8.8    Liver Function Tests:  Recent Labs Lab 05/03/14 2021  AST 17  ALT 18  ALKPHOS 70  BILITOT 0.5  PROT 7.5  ALBUMIN 3.8    CBC:  Recent Labs Lab 05/03/14 2021 05/04/14 0532  WBC 12.9* 10.8*  HGB 14.5 13.0  HCT 40.6 37.2*  MCV 92.7 93.0  PLT 206 180    Cardiac Enzymes:  Recent Labs Lab 05/04/14 0204 05/04/14 0744 05/04/14 1429  TROPONINI <0.30 <0.30 <0.30    BNP:  Recent Labs  09/06/13 0807 12/10/13 1310  12/12/13 0906  PROBNP 4612.0* 9452.0* 4015.0*   Echocardiogram: Study data: Technically difficult study. - Left ventricle: The cavity size was normal. Wall thickness was increased increased in a pattern of mild to moderate LVH. Systolic function was moderately to severely reduced. The estimated ejection fraction was in the range of 35% to 40%. Compared to prior study 09/07/13 LVEF has improved. - Aortic valve: Mildly calcified annulus. Trileaflet; mildly thickened leaflets. Valve area: 1.65cm^2(VTI). Valve area: 1.76cm^2 (Vmax). - Mitral valve: Mildly calcified annulus. Mildly thickened leaflets . Mild regurgitation. - Left atrium: The atrium was severely dilated. - Right ventricle: The cavity size was mildly dilated. - Right atrium: The atrium was severely dilated.    Medications:   Scheduled Medications: . aspirin EC  325 mg Oral Daily  . atorvastatin  10 mg Oral q1800  . carvedilol  6.25 mg Oral BID WC  . dabigatran  150 mg Oral BID  . insulin aspart  0-15 Units Subcutaneous TID WC  . insulin aspart  0-5 Units Subcutaneous QHS  . insulin aspart  4 Units Subcutaneous TID WC  . insulin detemir  14 Units Subcutaneous Q24H  . potassium chloride SA  20 mEq Oral BID  . sodium chloride  1,000 mL Intravenous Once  . sodium chloride  3 mL Intravenous Q12H  Infusions: . sodium chloride 100 mL/hr at 05/04/14 2141     Assessment and Plan:   1.Syncopal Episode: Feeling better now, blood pressure remains stable 130's to 140 systolic. He has been  Out of bed and has had no dizziness with position change. I will check orthostatics today, now that he has been rehydrated for completeness. Thought now to be related to hypovolemia with cardiac arrythmia less likely per discussion with Dr. Wyline MoodBranch. Diuretics have been held along with ACE and nitrates.   2.  Systolic Dysfunction: EF has improved per echo this admission from 25% to 40%.. This also makes cardiac arrhythmia less likely.Due to  bradycardia, carvedilol was held last evening. Would restart this at 6.25 mg daily.Consider OP cardiac monitor.  3.Atrial fibrillation: On dabigatran 150 mg BID. Review of labs does not reveal anemia. Heart rates have been controlled on review of telemetry, in the 40'-60s overnight. No pauses or aberrant conduction on review of telemetry.  4. CAD: CABG with interventions to RCA and LIMA without further targets for revascularization per recent cardiac cath in 2014. Continue statin therapy and ASA. He denies angina symptoms.   5. Chronic diarrhea: Recommend GI evaluation while in hospital as he never followed up as OP.Check stool for C-Diff if recurs. . No diarrhea so far this admission.   6. Diabetes: Per PTH.     Bettey MareKathryn M. Lyman BishopLawrence NP [facility] 05/05/2014, 9:27 AM3  Attending Note Patient seen and discussed with NP Lyman BishopLawrence, agree with her documentation above. Episode of syncope with clear evidence of hypovolemia and hypotension as cause, has had prior similar episodes despite cutting back on his diuretic. A questions remains on if he has chronic diarrhea or not, as he adamantly denies and his wife adamantly endorsed. Management has also been complicated in the long term by mixed medication compliance, he typically is unaware of medications and there doses and has refused assistance with home health. His wife typically know his medications fairly well, but there remains an uncertainty on his level of compliance. His echo shows improved LVEF function, LVEF 35-40%, he is above the cut off for ICD consideration. He has had syncopal episodes all explained by hypotension and hypovolemia, he has never had unexplained syncope or evidence of malignant cardiac arrhythmia. Tele at times shows a wide complex tach for short runs that is irregular and consistent with afib with aberrancy. His coreg has been decrased, lisinopril, digoxin, and imdur held.  Blood pressures have normalized, will continue  lower dose of coreg today, if bp remains stable restart low dose ACE, would start lisinopril 2.5mg  daily. Will keep off imdur and not restart. Will keep off digoxin and not restart as he has had some nighttime bradycardia (afib 40s-50s, the rates reported in 30s are due to low voltage and incorrect measurement by tele box) and his beta blocker is the higher priority. Continue to hold diuretic, when resumed would decrease to 20mg  daily. Would consider monitoring one more day today, if bp stable and asymptomatic potential discharge tomorrow with close cardiology follow up.   Dina RichJonathan Isaid Salvia MD

## 2014-05-05 NOTE — Progress Notes (Signed)
Patient HR drops into 3 Low 30's while asleep. Went into room, woke patient, he is alert and oriented x4 asymptomatic towards HR. Bennett ScrapeAlicia Wright RN

## 2014-05-06 LAB — GLUCOSE, CAPILLARY: GLUCOSE-CAPILLARY: 139 mg/dL — AB (ref 70–99)

## 2014-05-06 MED ORDER — FUROSEMIDE 40 MG PO TABS: ORAL_TABLET | ORAL | Status: DC

## 2014-05-06 MED ORDER — ASPIRIN 325 MG PO TBEC: 325.0000 mg | DELAYED_RELEASE_TABLET | Freq: Every day | ORAL | Status: DC

## 2014-05-06 NOTE — Discharge Summary (Signed)
Physician Discharge Summary  [name] MVH:[id number] DOB: [birth year] DOA: 05/03/2014  PCP: Vincent ReichertMCINNIS,Vincent Cudd G, MD  Admit date: 05/03/2014 Discharge date: 05/06/2014    Patient will benefit from ongoing skilled PT services in home health setting to continue to advance safe functional mobility, address ongoing impairments in , and minimize fall risk.  Discharge Diagnoses:  1. 1 syncope 2. Hypotension 3. Dehydration 4. Coronary artery disease 5. Non-insulin-dependent diabetes 6. Systolic congestive heart failure with 25% ejection fraction 7.   Discharge Condition: Stable Disposition: Home Diet recommendation: 2000-calorie ADA diet low sodium  Filed Weights   05/03/14 1857 05/04/14 0207  Weight: 75.751 kg (167 lb) 74.4 kg (164 lb 0.4 oz)    History of present illness:  The patient was admitted to the hospital after a fall at home. He apparently had episode of syncope was admitted through ED found to be hypotensive with systolic blood pressure in 70s he was given an intravenous fluid and was able to mentate Foley catheter was placed. Hospitalist was asked to admit him for syncope workup in rehydration. With IV fluids his blood pressures returned to more normal range. He was continued on the medications listed below. He remained fairly alert on previous cardiac catheterization it was noted that he had severe left ventricular dysfunction with ejection fraction 20-25% severe three-vessel coronary artery disease your he was more comfortable during the last part of his hospital stay although he did have short runs [facility]  Hospital Course:  See above as it relates to his hospital course. The patient had no further episodes of syncope his diabetes was fairly controlled. He remained without symptoms. It was felt he could be discharged   Discharge Instructions The patient is to continue medications listed below and he is to followup with cardiology as an outpatient and to return to primary  care physician's office as well    Medication List         aspirin 325 MG EC tablet  Take 1 tablet (325 mg total) by mouth daily.     carvedilol 25 MG tablet  Commonly known as:  COREG  Take 25 mg by mouth 2 (two) times daily.     digoxin 0.125 MG tablet  Commonly known as:  LANOXIN  Take 125 mcg by mouth daily.     furosemide 40 MG tablet  Commonly known as:  LASIX  Take 40 mg by mouth 2 (two) times daily. Take 40 mg every other day.     furosemide 40 MG tablet  Commonly known as:  LASIX  Take 40 mg every other day.     insulin detemir 100 UNIT/ML injection  Commonly known as:  LEVEMIR  Inject 25 Units into the skin every morning.     isosorbide mononitrate 30 MG 24 hr tablet  Commonly known as:  IMDUR  Take 1 tablet (30 mg total) by mouth daily.     lisinopril 5 MG tablet  Commonly known as:  PRINIVIL,ZESTRIL  Take 1 tablet (5 mg total) by mouth daily.     nitroGLYCERIN 0.4 MG SL tablet  Commonly known as:  NITROSTAT  Place 1 tablet (0.4 mg total) under the tongue every 5 (five) minutes as needed.     NOVOLOG FLEXPEN 100 UNIT/ML FlexPen  Generic drug:  insulin aspart  Inject 15-20 Units into the skin 3 (three) times daily with meals.     potassium chloride SA 20 MEQ tablet  Commonly known as:  K-DUR,KLOR-CON  Take 1 tablet (  20 mEq total) by mouth 2 (two) times daily.     PRADAXA 150 MG Caps capsule  Generic drug:  dabigatran  Take 150 mg by mouth 2 (two) times daily.     rosuvastatin 40 MG tablet  Commonly known as:  CRESTOR  Take 40 mg by mouth daily.       No Known Allergies  The results of significant diagnostics from this hospitalization (including imaging, microbiology, ancillary and laboratory) are listed below for reference.    Significant Diagnostic Studies: No results found.  Microbiology: No results found for this or any previous visit (from the past 240 hour(s)).   Labs: Basic Metabolic Panel:  Recent Labs Lab 05/03/14 2021  05/04/14 0532  NA 135* 138  K 4.3 3.9  CL 98 103  CO2 23 22  GLUCOSE 146* 210*  BUN 29* 26*  CREATININE 1.28 1.09  CALCIUM 10.1 8.8   Liver Function Tests:  Recent Labs Lab 05/03/14 2021  AST 17  ALT 18  ALKPHOS 70  BILITOT 0.5  PROT 7.5  ALBUMIN 3.8   No results found for this basename: LIPASE, AMYLASE,  in the last 168 hours No results found for this basename: AMMONIA,  in the last 168 hours CBC:  Recent Labs Lab 05/03/14 2021 05/04/14 0532  WBC 12.9* 10.8*  NEUTROABS 9.9*  --   HGB 14.5 13.0  HCT 40.6 37.2*  MCV 92.7 93.0  PLT 206 180   Cardiac Enzymes:  Recent Labs Lab 05/03/14 2021 05/04/14 0204 05/04/14 0744 05/04/14 1429  TROPONINI <0.30 <0.30 <0.30 <0.30   BNP: BNP (last 3 results)  Recent Labs  09/06/13 0807 12/10/13 1310 12/12/13 0906  PROBNP 4612.0* 9452.0* 4015.0*   CBG:  Recent Labs Lab 05/04/14 2136 05/05/14 0715 05/05/14 1145 05/05/14 1707 05/05/14 2017  GLUCAP 247* 172* 266* 213* 227*    Principal Problem:   Fall Active Problems:   DIABETES MELLITUS, TYPE II   Hyperlipidemia   PERIPHERAL VASCULAR DISEASE   Atrial fibrillation   Chronic anticoagulation   Non-compliance   DKA, type 2   Chronic combined systolic and diastolic CHF, NYHA class 3   Hypotension due to drugs   Syncope and collapse   Time coordinating discharge 30 minutes Signed:  Butch PennyAngus Thuan Tippett, MD 05/06/2014, 7:10 AM

## 2014-05-19 NOTE — Progress Notes (Signed)
This encounter was created in error - please disregard.

## 2014-05-30 ENCOUNTER — Encounter (HOSPITAL_COMMUNITY): Payer: Self-pay | Admitting: Emergency Medicine

## 2014-05-30 ENCOUNTER — Emergency Department (HOSPITAL_COMMUNITY)
Admission: EM | Admit: 2014-05-30 | Discharge: 2014-05-31 | Disposition: A | Discharge status: Home / Self Care | Payer: Medicare Other | Attending: Emergency Medicine | Admitting: Emergency Medicine

## 2014-05-30 ENCOUNTER — Emergency Department (HOSPITAL_COMMUNITY): Payer: Medicare Other

## 2014-05-30 DIAGNOSIS — Z79899 Other long term (current) drug therapy: Secondary | ICD-10-CM | POA: Insufficient documentation

## 2014-05-30 DIAGNOSIS — E785 Hyperlipidemia, unspecified: Secondary | ICD-10-CM | POA: Insufficient documentation

## 2014-05-30 DIAGNOSIS — R079 Chest pain, unspecified: Secondary | ICD-10-CM | POA: Insufficient documentation

## 2014-05-30 DIAGNOSIS — E119 Type 2 diabetes mellitus without complications: Secondary | ICD-10-CM | POA: Insufficient documentation

## 2014-05-30 DIAGNOSIS — I5022 Chronic systolic (congestive) heart failure: Secondary | ICD-10-CM | POA: Insufficient documentation

## 2014-05-30 DIAGNOSIS — Z7982 Long term (current) use of aspirin: Secondary | ICD-10-CM | POA: Insufficient documentation

## 2014-05-30 DIAGNOSIS — I2510 Atherosclerotic heart disease of native coronary artery without angina pectoris: Secondary | ICD-10-CM | POA: Insufficient documentation

## 2014-05-30 DIAGNOSIS — Z4801 Encounter for change or removal of surgical wound dressing: Secondary | ICD-10-CM | POA: Insufficient documentation

## 2014-05-30 DIAGNOSIS — I252 Old myocardial infarction: Secondary | ICD-10-CM | POA: Insufficient documentation

## 2014-05-30 DIAGNOSIS — F17200 Nicotine dependence, unspecified, uncomplicated: Secondary | ICD-10-CM | POA: Insufficient documentation

## 2014-05-30 DIAGNOSIS — Z794 Long term (current) use of insulin: Secondary | ICD-10-CM | POA: Insufficient documentation

## 2014-05-30 LAB — TROPONIN I

## 2014-05-30 LAB — CBC WITH DIFFERENTIAL/PLATELET
BASOS ABS: 0.1 10*3/uL (ref 0.0–0.1)
BASOS PCT: 1 % (ref 0–1)
Eosinophils Absolute: 0.4 10*3/uL (ref 0.0–0.7)
Eosinophils Relative: 3 % (ref 0–5)
HCT: 43.2 % (ref 39.0–52.0)
HEMOGLOBIN: 15.4 g/dL (ref 13.0–17.0)
Lymphocytes Relative: 16 % (ref 12–46)
Lymphs Abs: 2.3 10*3/uL (ref 0.7–4.0)
MCH: 32.7 pg (ref 26.0–34.0)
MCHC: 35.6 g/dL (ref 30.0–36.0)
MCV: 91.7 fL (ref 78.0–100.0)
Monocytes Absolute: 1.1 10*3/uL — ABNORMAL HIGH (ref 0.1–1.0)
Monocytes Relative: 7 % (ref 3–12)
NEUTROS ABS: 10.9 10*3/uL — AB (ref 1.7–7.7)
NEUTROS PCT: 73 % (ref 43–77)
PLATELETS: 189 10*3/uL (ref 150–400)
RBC: 4.71 MIL/uL (ref 4.22–5.81)
RDW: 12.5 % (ref 11.5–15.5)
WBC: 14.8 10*3/uL — ABNORMAL HIGH (ref 4.0–10.5)

## 2014-05-30 LAB — BASIC METABOLIC PANEL
BUN: 36 mg/dL — ABNORMAL HIGH (ref 6–23)
CHLORIDE: 99 meq/L (ref 96–112)
CO2: 20 mEq/L (ref 19–32)
Calcium: 9.8 mg/dL (ref 8.4–10.5)
Creatinine, Ser: 1.22 mg/dL (ref 0.50–1.35)
GFR calc non Af Amer: 60 mL/min — ABNORMAL LOW (ref >90)
GFR, EST AFRICAN AMERICAN: 69 mL/min — AB (ref >90)
Glucose, Bld: 190 mg/dL — ABNORMAL HIGH (ref 70–99)
Potassium: 4.7 mEq/L (ref 3.7–5.3)
Sodium: 136 mEq/L — ABNORMAL LOW (ref 137–147)

## 2014-05-30 NOTE — ED Provider Notes (Addendum)
CSN: [id number]     Arrival date & time 05/30/14  2000 History  This chart was scribed for Vincent Hutching, MD by Danella Maiers, ED Scribe. This patient was seen in room APA05/APA05 and the patient's care was started at 8:15 PM.    Chief Complaint  Patient presents with  . Chest Pain   HPI HPI Comments: [name] is a 68 y.o. male with a h/o MI, CABG who presents to the Emergency Department complaining of sudden-onset, aching CP that started one hour ago while sitting down. He is still having the pain currently. He has not taken anything for the pain. Wife states he appears the same as usual. He does not take nitroglycerin. He does take aspirin every morning and he took one today. He denies SOB, diaphoresis, nausea. His BPs run around 120. He smokes 1 PPD.  PCP - [name] in [place] Cardiologist - [place]   Past Medical History  Diagnosis Date  . Coronary atherosclerosis of native coronary artery     Ant. MI in 4/95; PTCA of 90% prox & distal LAD unsuccessful-->  Urgent CABG-4/95 TO Cx; 50% RCA; ant. HK; EF of 50-55%; 08/2008: 3-V disease plus a  95% LIMA stenosis, treated with DES; occlusion of SVG to CX; RCA SVG stenosis--> PCI with DES  2010: in-stent restenosis in the LIMA-->cutting balloon; EF of 35-40%;  07/2010: TO of SVG to     RCA-medical therapy advised; EF of 30-35% by echo in 12/2010  . Atrial fibrillation 1995    1995, 2009; bradycardia with beta blocker therapy  . Tobacco abuse     40-50 pack years; currently one pack per day  . Peripheral vascular disease 1995    Abdominal aortic obstruction 1995-->vascular surgery  . Hyperlipidemia   . Type 2 diabetes mellitus   . DDD (degenerative disc disease)     Discectomy and fusion at L4 and L5  . Alcohol use 1999    Excessive alcohol use- quit in 1999  . Chronic systolic heart failure     LVEF 25-30% 2011  . Non-compliance    Past Surgical History  Procedure Laterality Date  . Cholecystectomy  1995  . Lumbar fusion       +Discectomy;x2; L4 and L5  . Aorto-femoral bypass graft  1995   Family History  Problem Relation Age of Onset  . CAD Father    History  Substance Use Topics  . Smoking status: Current Every Day Smoker -- 0.50 packs/day    Types: Cigarettes  . Smokeless tobacco: Current User     Comment: refused  . Alcohol Use: No     Comment: former    Review of Systems  Constitutional: Negative for diaphoresis.  Respiratory: Negative for shortness of breath.   Cardiovascular: Positive for chest pain.  Gastrointestinal: Negative for nausea.  A complete 10 system review of systems was obtained and all systems are negative except as noted in the HPI and PMH.      Allergies  Review of patient's allergies indicates no known allergies.  Home Medications   Prior to Admission medications   Medication Sig Start Date End Date Taking? Authorizing Provider  aspirin EC 325 MG EC tablet Take 1 tablet (325 mg total) by mouth daily. 05/06/14  Yes Angus Edilia Bo, MD  carvedilol (COREG) 25 MG tablet Take 25 mg by mouth 2 (two) times daily.   Yes Historical Provider, MD  dabigatran (PRADAXA) 150 MG CAPS capsule Take 150 mg by mouth 2 (two)  times daily.   Yes Historical Provider, MD  digoxin (LANOXIN) 0.125 MG tablet Take 125 mcg by mouth daily.     Yes Historical Provider, MD  furosemide (LASIX) 40 MG tablet Take 40 mg every other day. 05/06/14  Yes Angus Edilia Bo, MD  insulin aspart (NOVOLOG FLEXPEN) 100 UNIT/ML FlexPen Inject 15-20 Units into the skin 3 (three) times daily with meals.    Yes Historical Provider, MD  insulin detemir (LEVEMIR) 100 UNIT/ML injection Inject 25 Units into the skin every morning.    Yes Historical Provider, MD  isosorbide mononitrate (IMDUR) 30 MG 24 hr tablet Take 1 tablet (30 mg total) by mouth daily. 09/08/13  Yes Angus Edilia Bo, MD  lisinopril (PRINIVIL,ZESTRIL) 5 MG tablet Take 1 tablet (5 mg total) by mouth daily. 02/28/14  Yes Antoine Poche, MD  potassium chloride SA  (K-DUR,KLOR-CON) 20 MEQ tablet Take 1 tablet (20 mEq total) by mouth 2 (two) times daily. 12/27/13  Yes Jodelle Gross, NP  rosuvastatin (CRESTOR) 40 MG tablet Take 40 mg by mouth daily.   Yes Historical Provider, MD  nitroGLYCERIN (NITROSTAT) 0.4 MG SL tablet Place 1 tablet (0.4 mg total) under the tongue every 5 (five) minutes as needed. 05/24/12   Kathlen Brunswick, MD   BP 110/47  Pulse 76  Temp(Src) 97.8 F (36.6 C) (Oral)  Resp 20  SpO2 100% Physical Exam  Nursing note and vitals reviewed. Constitutional: He is oriented to person, place, and time. He appears well-developed and well-nourished.  HENT:  Head: Normocephalic and atraumatic.  Eyes: Conjunctivae and EOM are normal. Pupils are equal, round, and reactive to light.  Neck: Normal range of motion. Neck supple.  Cardiovascular: Normal rate, regular rhythm and normal heart sounds.   Pulmonary/Chest: Effort normal and breath sounds normal.  Abdominal: Soft. Bowel sounds are normal.  Musculoskeletal: Normal range of motion.  Neurological: He is alert and oriented to person, place, and time.  Skin: Skin is warm and dry.  Psychiatric: He has a normal mood and affect. His behavior is normal.    ED Course  Procedures (including critical care time) Medications - No data to display  DIAGNOSTIC STUDIES: Oxygen Saturation is 100% on RA, normal by my interpretation.    COORDINATION OF CARE: 8:32 PM- Discussed treatment plan with pt which includes EKG, CXR, troponin, basic labs. Pt agrees to plan.    Labs Review Labs Reviewed  BASIC METABOLIC PANEL - Abnormal; Notable for the following:    Sodium 136 (*)    Glucose, Bld 190 (*)    BUN 36 (*)    GFR calc non Af Amer 60 (*)    GFR calc Af Amer 69 (*)    All other components within normal limits  CBC WITH DIFFERENTIAL - Abnormal; Notable for the following:    WBC 14.8 (*)    Neutro Abs 10.9 (*)    Monocytes Absolute 1.1 (*)    All other components within normal limits   TROPONIN I  CBC WITH DIFFERENTIAL  TROPONIN I    Imaging Review Dg Chest Port 1 View  05/30/2014   CLINICAL DATA:  Chest pain.  EXAM: PORTABLE CHEST - 1 VIEW  COMPARISON:  04/05/2014  FINDINGS: There is chronic mild cardiomegaly. CABG changes again noted. Chronic interstitial coarsening without overt edema. Blunting of the lateral costophrenic sulci, not seen previously. No asymmetric opacity. No pneumothorax.  IMPRESSION: Possible trace pleural effusions.  No pulmonary edema or pneumonia.   Electronically Signed  By: Tiburcio PeaJonathan  Watts M.D.   On: 05/30/2014 20:58     EKG Interpretation   Date/Time:  Tuesday May 30 2014 20:14:31 EDT Ventricular Rate:  74 PR Interval:    QRS Duration: 81 QT Interval:  359 QTC Calculation: 398 R Axis:   69 Text Interpretation:  Atrial fibrillation Probable anterolateral infarct,  age indeterm Confirmed by Tationa Stech  MD, Aiko Belko ([id number]) on 05/30/2014 8:16:38 PM      MDM   Final diagnoses:  Chest pain    Recheck at 12:15    Patient is pain-free. Repeat troponin at 0100. Discussed with Dr. Preston FleetingGlick. Patient is hemodynamically stable.  Hemoglobin, EKG, troponin all stable  I personally performed the services described in this documentation, which was scribed in my presence. The recorded information has been reviewed and is accurate.    Vincent HutchingBrian Birdena Kingma, MD 05/31/14 [phone]  Vincent HutchingBrian Allen Basista, MD 05/31/14 [phone]

## 2014-05-30 NOTE — ED Notes (Signed)
Cp started 1 hr prior to arrival. Reports " just sitting and it started hurting.

## 2014-05-31 LAB — TROPONIN I

## 2014-05-31 NOTE — Discharge Instructions (Signed)
Chest Pain (Nonspecific) Chest pain has many causes. Your pain could be caused by something serious, such as a heart attack or a blood clot in the lungs. It could also be caused by something less serious, such as a chest bruise or a virus. Follow up with your doctor. More lab tests or other studies may be needed to find the cause of your pain. Most of the time, nonspecific chest pain will improve within 2 to 3 days of rest and mild pain medicine. HOME CARE  For chest bruises, you may put ice on the sore area for 15-20 minutes, 03-04 times a day. Do this only if it makes you feel better.  Put ice in a plastic bag.  Place a towel between the skin and the bag.  Rest for the next 2 to 3 days.  Go back to work if the pain improves.  See your doctor if the pain lasts longer than 1 to 2 weeks.  Only take medicine as told by your doctor.  Quit smoking if you smoke. GET HELP RIGHT AWAY IF:   There is more pain or pain that spreads to the arm, neck, jaw, back, or belly (abdomen).  You have shortness of breath.  You cough more than usual or cough up blood.  You have very bad back or belly pain, feel sick to your stomach (nauseous), or throw up (vomit).  You have very bad weakness.  You pass out (faint).  You have a fever. Any of these problems may be serious and may be an emergency. Do not wait to see if the problems will go away. Get medical help right away. Call your local emergency services 911 in [place] Do not drive yourself to the hospital. MAKE SURE YOU:   Understand these instructions.  Will watch this condition.  Will get help right away if you or your child is not doing well or gets worse. Document Released: 05/26/2008 Document Revised: 03/01/2012 Document Reviewed: 05/26/2008 [facility] Patient Information 2014 [place], [place].   Tests show no evidence of a heart attack. Followup with her cardiologist. Return if worse

## 2014-05-31 NOTE — ED Notes (Signed)
nad noted prior to dc. Dc instructions reviewed and explained. Left without difficulty.

## 2014-06-07 ENCOUNTER — Ambulatory Visit (INDEPENDENT_AMBULATORY_CARE_PROVIDER_SITE_OTHER): Payer: Medicare Other | Admitting: Cardiology

## 2014-06-07 VITALS — BP 110/58 | HR 76 | Wt 163.0 lb

## 2014-06-07 DIAGNOSIS — I2589 Other forms of chronic ischemic heart disease: Secondary | ICD-10-CM

## 2014-06-07 DIAGNOSIS — R55 Syncope and collapse: Secondary | ICD-10-CM | POA: Insufficient documentation

## 2014-06-07 DIAGNOSIS — I255 Ischemic cardiomyopathy: Secondary | ICD-10-CM

## 2014-06-07 DIAGNOSIS — I4891 Unspecified atrial fibrillation: Secondary | ICD-10-CM

## 2014-06-07 DIAGNOSIS — I2510 Atherosclerotic heart disease of native coronary artery without angina pectoris: Secondary | ICD-10-CM

## 2014-06-07 NOTE — Patient Instructions (Signed)
Your physician recommends that you schedule a follow-up appointment in: 3 months  Your physician recommends that you continue on your current medications as directed. Please refer to the Current Medication list given to you today.  Thank you for choosing  [facility]!!    

## 2014-06-07 NOTE — Progress Notes (Signed)
Clinical Summary Mr. Hanauer is a 68 y.o.male seen today for follow up of the following medical problems.   1. ICM  - LVEF 35-40% by echo 04/2014, prior CABG in 1995 with subsequent stenting in 2009 and 2011.  - previously changed lasix to every other day in setting of diarrhea with extra volume losses and hypotension  - since making change denies any signicant SOB, no LE.  2. Afib  - denies any recent palpitations  - compliant with pradaxa   3. Syncope - recent admission 04/2014 with syncope and clear evidence of hypovolemia and hypotension, no evidence of cardiac arrhythmia. - denies any recent symptoms  4. Chest pain - last cath 11/2013 with severe 3 vessel CAD, SVG x2 chronically occluded, and patent LIMA-LAD. Overall stable unchanged coronary anatomy. Previous caths describe anatomy not amenable to revascularization. - recent ER visit 05/30/14 with chest pain - pain in midchest, 8/10. Started at rest. No other associated symptpoms. Not sure how long lasted. Did not try NG.  No evidence of ACS in ER, discharged home   Past Medical History  Diagnosis Date  . Coronary atherosclerosis of native coronary artery     Ant. MI in 4/95; PTCA of 90% prox & distal LAD unsuccessful-->  Urgent CABG-4/95 TO Cx; 50% RCA; ant. HK; EF of 50-55%; 08/2008: 3-V disease plus a  95% LIMA stenosis, treated with DES; occlusion of SVG to CX; RCA SVG stenosis--> PCI with DES  2010: in-stent restenosis in the LIMA-->cutting balloon; EF of 35-40%;  07/2010: TO of SVG to     RCA-medical therapy advised; EF of 30-35% by echo in 12/2010  . Atrial fibrillation 1995    1995, 2009; bradycardia with beta blocker therapy  . Tobacco abuse     40-50 pack years; currently one pack per day  . Peripheral vascular disease 1995    Abdominal aortic obstruction 1995-->vascular surgery  . Hyperlipidemia   . Type 2 diabetes mellitus   . DDD (degenerative disc disease)     Discectomy and fusion at L4 and L5  . Alcohol use  1999    Excessive alcohol use- quit in 1999  . Chronic systolic heart failure     LVEF 25-30% 2011  . Non-compliance      No Known Allergies   Current Outpatient Prescriptions  Medication Sig Dispense Refill  . aspirin EC 325 MG EC tablet Take 1 tablet (325 mg total) by mouth daily.  30 tablet  0  . carvedilol (COREG) 25 MG tablet Take 25 mg by mouth 2 (two) times daily.      . dabigatran (PRADAXA) 150 MG CAPS capsule Take 150 mg by mouth 2 (two) times daily.      . digoxin (LANOXIN) 0.125 MG tablet Take 125 mcg by mouth daily.        . furosemide (LASIX) 40 MG tablet Take 40 mg every other day.  30 tablet  3  . insulin aspart (NOVOLOG FLEXPEN) 100 UNIT/ML FlexPen Inject 15-20 Units into the skin 3 (three) times daily with meals.       . insulin detemir (LEVEMIR) 100 UNIT/ML injection Inject 25 Units into the skin every morning.       . isosorbide mononitrate (IMDUR) 30 MG 24 hr tablet Take 1 tablet (30 mg total) by mouth daily.  30 tablet  5  . lisinopril (PRINIVIL,ZESTRIL) 5 MG tablet Take 1 tablet (5 mg total) by mouth daily.  90 tablet  3  . nitroGLYCERIN (  NITROSTAT) 0.4 MG SL tablet Place 1 tablet (0.4 mg total) under the tongue every 5 (five) minutes as needed.  25 tablet  6  . potassium chloride SA (K-DUR,KLOR-CON) 20 MEQ tablet Take 1 tablet (20 mEq total) by mouth 2 (two) times daily.  60 tablet  6  . rosuvastatin (CRESTOR) 40 MG tablet Take 40 mg by mouth daily.       No current facility-administered medications for this visit.     Past Surgical History  Procedure Laterality Date  . Cholecystectomy  1995  . Lumbar fusion      +Discectomy;x2; L4 and L5  . Aorto-femoral bypass graft  1995     No Known Allergies    Family History  Problem Relation Age of Onset  . CAD Father      Social History [name] reports that he has been smoking Cigarettes.  He has been smoking about 0.50 packs per day. He uses smokeless tobacco. [name] reports that he does not  drink alcohol.   Review of Systems CONSTITUTIONAL: No weight loss, fever, chills, weakness or fatigue.  HEENT: Eyes: No visual loss, blurred vision, double vision or yellow sclerae.No hearing loss, sneezing, congestion, runny nose or sore throat.  SKIN: No rash or itching.  CARDIOVASCULAR: per HPI RESPIRATORY: No shortness of breath, cough or sputum.  GASTROINTESTINAL: No anorexia, nausea, vomiting or diarrhea. No abdominal pain or blood.  GENITOURINARY: No burning on urination, no polyuria NEUROLOGICAL: No headache, dizziness, syncope, paralysis, ataxia, numbness or tingling in the extremities. No change in bowel or bladder control.  MUSCULOSKELETAL: No muscle, back pain, joint pain or stiffness.  LYMPHATICS: No enlarged nodes. No history of splenectomy.  PSYCHIATRIC: No history of depression or anxiety.  ENDOCRINOLOGIC: No reports of sweating, cold or heat intolerance. No polyuria or polydipsia.  Marland Kitchen.   Physical Examination p 76 bp 110/58 Wt 163 lbs  Gen: resting comfortably, no acute distress HEENT: no scleral icterus, pupils equal round and reactive, no palptable cervical adenopathy,  CV: RRR, no m/r/g, no JVD Resp: Clear to auscultation bilaterally GI: abdomen is soft, non-tender, non-distended, normal bowel sounds, no hepatosplenomegaly MSK: extremities are warm, no edema.  Skin: warm, no rash Neuro:  no focal deficits Psych: appropriate affect   Diagnostic Studies  04/24/14 Echo - Study data: Technically difficult study. - Left ventricle: The cavity size was normal. Wall thickness was increased increased in a pattern of mild to moderate LVH. Systolic function was moderately to severely reduced. The estimated ejection fraction was in the range of 35% to 40%. Compared to prior study 09/07/13 LVEF has improved. - Aortic valve: Mildly calcified annulus. Trileaflet; mildly thickened leaflets. Valve area: 1.65cm^2(VTI). Valve area: 1.76cm^2 (Vmax). - Mitral valve: Mildly  calcified annulus. Mildly thickened leaflets . Mild regurgitation. - Left atrium: The atrium was severely dilated. - Right ventricle: The cavity size was mildly dilated. - Right atrium: The atrium was severely dilated.  08/2013 Lexiscan IMPRESSION: High risk study for major cardiac events due to low ejection fraction and multiple perfusion defects. There is a large fixed anteroseptal scar. There is a moderate sized defect in the inferolateral wall with minimal myocardium at jeopardy. There were no diagnostic ST-segment abnormalities. No chest pain reported. LVEF 29%  11/2013 Cath Findings:  Ao Pressure: 106/69 (83)  LV Pressure: 111/21/25  There was no signficant gradient across the aortic valve on pullback.  Left main: 50-60% ostial  Left anterior descending artery: The left anterior descending artery was completely occluded after  a small diagonal branch. There was a 90% lesion in the ostium of the diagonal branch  Circumflex artery: There was 70% proximal lesion. The circumflex artery was then completely occluded after a moderate sized marginal branch. There were L to right collaterals to the distal RCA  Right coronary artery: The right coronary artery was completely occluded at its midportion. There were collaterals to the distal circumflex artery froma right ventricular branch.  The LIMA graft to the LAD was patent. There was 30% narrowing at the ostium of the LAD at the proximal edge of the stent. The LAD itself was diffusely diseased with 70-80% diffuse disease distally. The LAD filled  the posterior descending branch by collaterals.  The saphenous vein graft to the right coronary artery was completely occluded at its origin at the stent site.  The saphenous vein graft to the circumflex system was completely occluded by prior study.  Left ventriculogram: The left ventriculogram performed in the RAO projection showed a dilated LV with EF 20-25% hypokinesis of the basilar to mid  anterior wall and akinesis. Akinesis of the mid to distal anterior wall and entire apex. The basal to mid inferior wall was severely hypokinetic  Assessment:  1. Severe 3-VAD CAD  2. SVG x 2 (RCA and OM) chronically occluded  3. Patent LIMA to LAD  4. Severe LV dysfunction EF 20-25% with elevated LVEDP  Plan/Discussion:  Coronary anatomy is stable since prior cath in 2011. No clear culprit for NSTEMI or target for revascularization. Elevated filling pressures. Can go home later today or tomorrow if pain under control.       Assessment and Plan  1. ICM - LVEF 35-40% bye cho 04/2014, he is NYHA III. Appears euvolemic today. Medical therapy has been limited due to hypotension, continue current meds  2. Afib - no current symptoms, continue current meds  3. Syncope - episode in setting of hypovolemia and hypotension, no evidence of cardiac arrhtyhmia. Diuretic decreased to every other day, denies any recent symptoms - continue to follow  4. Chest pain/CAD - isolated episode, he has known CAD that as of last cath 11/2013 is not amenable to revascularization. Antianginal therapy limited due to hypotension, encouraged to use prn NG as he has not been using it. Consider ranexa at next visit if symptoms persist.  - decrease ASA to 81 mg daily  F/u 3 months  Antoine PocheJonathan F. Branch, M.D., F.A.C.C.

## 2014-06-11 ENCOUNTER — Emergency Department (HOSPITAL_COMMUNITY): Payer: Medicare Other

## 2014-06-11 ENCOUNTER — Observation Stay (HOSPITAL_COMMUNITY)
Admission: EM | Admit: 2014-06-11 | Discharge: 2014-06-13 | Disposition: A | Discharge status: Home / Self Care | Payer: Medicare Other | Attending: Family Medicine | Admitting: Family Medicine

## 2014-06-11 ENCOUNTER — Encounter (HOSPITAL_COMMUNITY): Payer: Self-pay | Admitting: Emergency Medicine

## 2014-06-11 DIAGNOSIS — E119 Type 2 diabetes mellitus without complications: Secondary | ICD-10-CM

## 2014-06-11 DIAGNOSIS — Z7982 Long term (current) use of aspirin: Secondary | ICD-10-CM | POA: Insufficient documentation

## 2014-06-11 DIAGNOSIS — Z79899 Other long term (current) drug therapy: Secondary | ICD-10-CM | POA: Insufficient documentation

## 2014-06-11 DIAGNOSIS — F17200 Nicotine dependence, unspecified, uncomplicated: Secondary | ICD-10-CM

## 2014-06-11 DIAGNOSIS — I209 Angina pectoris, unspecified: Secondary | ICD-10-CM

## 2014-06-11 DIAGNOSIS — IMO0002 Reserved for concepts with insufficient information to code with codable children: Secondary | ICD-10-CM | POA: Insufficient documentation

## 2014-06-11 DIAGNOSIS — I25701 Atherosclerosis of coronary artery bypass graft(s), unspecified, with angina pectoris with documented spasm: Secondary | ICD-10-CM

## 2014-06-11 DIAGNOSIS — I4891 Unspecified atrial fibrillation: Secondary | ICD-10-CM | POA: Diagnosis present

## 2014-06-11 DIAGNOSIS — Z7901 Long term (current) use of anticoagulants: Secondary | ICD-10-CM

## 2014-06-11 DIAGNOSIS — I739 Peripheral vascular disease, unspecified: Secondary | ICD-10-CM

## 2014-06-11 DIAGNOSIS — Z7902 Long term (current) use of antithrombotics/antiplatelets: Secondary | ICD-10-CM | POA: Insufficient documentation

## 2014-06-11 DIAGNOSIS — Z91199 Patient's noncompliance with other medical treatment and regimen due to unspecified reason: Secondary | ICD-10-CM

## 2014-06-11 DIAGNOSIS — I509 Heart failure, unspecified: Secondary | ICD-10-CM

## 2014-06-11 DIAGNOSIS — R0602 Shortness of breath: Secondary | ICD-10-CM | POA: Insufficient documentation

## 2014-06-11 DIAGNOSIS — I482 Chronic atrial fibrillation, unspecified: Secondary | ICD-10-CM

## 2014-06-11 DIAGNOSIS — I2510 Atherosclerotic heart disease of native coronary artery without angina pectoris: Secondary | ICD-10-CM | POA: Diagnosis present

## 2014-06-11 DIAGNOSIS — R739 Hyperglycemia, unspecified: Secondary | ICD-10-CM

## 2014-06-11 DIAGNOSIS — R0789 Other chest pain: Principal | ICD-10-CM | POA: Insufficient documentation

## 2014-06-11 DIAGNOSIS — Z9119 Patient's noncompliance with other medical treatment and regimen: Secondary | ICD-10-CM

## 2014-06-11 DIAGNOSIS — I5022 Chronic systolic (congestive) heart failure: Secondary | ICD-10-CM | POA: Insufficient documentation

## 2014-06-11 DIAGNOSIS — I952 Hypotension due to drugs: Secondary | ICD-10-CM

## 2014-06-11 DIAGNOSIS — Z9889 Other specified postprocedural states: Secondary | ICD-10-CM | POA: Insufficient documentation

## 2014-06-11 DIAGNOSIS — R079 Chest pain, unspecified: Secondary | ICD-10-CM

## 2014-06-11 DIAGNOSIS — E785 Hyperlipidemia, unspecified: Secondary | ICD-10-CM

## 2014-06-11 DIAGNOSIS — Z951 Presence of aortocoronary bypass graft: Secondary | ICD-10-CM | POA: Insufficient documentation

## 2014-06-11 DIAGNOSIS — N289 Disorder of kidney and ureter, unspecified: Secondary | ICD-10-CM

## 2014-06-11 DIAGNOSIS — I5042 Chronic combined systolic (congestive) and diastolic (congestive) heart failure: Secondary | ICD-10-CM

## 2014-06-11 DIAGNOSIS — Z794 Long term (current) use of insulin: Secondary | ICD-10-CM | POA: Insufficient documentation

## 2014-06-11 LAB — COMPREHENSIVE METABOLIC PANEL
ALT: 22 U/L (ref 0–53)
AST: 14 U/L (ref 0–37)
Albumin: 3.6 g/dL (ref 3.5–5.2)
Alkaline Phosphatase: 69 U/L (ref 39–117)
BUN: 26 mg/dL — AB (ref 6–23)
CALCIUM: 9.2 mg/dL (ref 8.4–10.5)
CO2: 23 meq/L (ref 19–32)
CREATININE: 1.46 mg/dL — AB (ref 0.50–1.35)
Chloride: 103 mEq/L (ref 96–112)
GFR calc Af Amer: 56 mL/min — ABNORMAL LOW (ref >90)
GFR, EST NON AFRICAN AMERICAN: 48 mL/min — AB (ref >90)
Glucose, Bld: 221 mg/dL — ABNORMAL HIGH (ref 70–99)
Potassium: 4.2 mEq/L (ref 3.7–5.3)
Sodium: 141 mEq/L (ref 137–147)
Total Bilirubin: 0.2 mg/dL — ABNORMAL LOW (ref 0.3–1.2)
Total Protein: 6.9 g/dL (ref 6.0–8.3)

## 2014-06-11 LAB — CBC WITH DIFFERENTIAL/PLATELET
BASOS PCT: 1 % (ref 0–1)
Basophils Absolute: 0.1 10*3/uL (ref 0.0–0.1)
EOS PCT: 7 % — AB (ref 0–5)
Eosinophils Absolute: 0.8 10*3/uL — ABNORMAL HIGH (ref 0.0–0.7)
HEMATOCRIT: 40.8 % (ref 39.0–52.0)
Hemoglobin: 14.2 g/dL (ref 13.0–17.0)
LYMPHS PCT: 19 % (ref 12–46)
Lymphs Abs: 2.2 10*3/uL (ref 0.7–4.0)
MCH: 32.1 pg (ref 26.0–34.0)
MCHC: 34.8 g/dL (ref 30.0–36.0)
MCV: 92.3 fL (ref 78.0–100.0)
MONO ABS: 0.8 10*3/uL (ref 0.1–1.0)
Monocytes Relative: 7 % (ref 3–12)
Neutro Abs: 7.9 10*3/uL — ABNORMAL HIGH (ref 1.7–7.7)
Neutrophils Relative %: 66 % (ref 43–77)
Platelets: 171 10*3/uL (ref 150–400)
RBC: 4.42 MIL/uL (ref 4.22–5.81)
RDW: 12.5 % (ref 11.5–15.5)
WBC: 11.9 10*3/uL — ABNORMAL HIGH (ref 4.0–10.5)

## 2014-06-11 LAB — PRO B NATRIURETIC PEPTIDE: PRO B NATRI PEPTIDE: 2933 pg/mL — AB (ref 0–125)

## 2014-06-11 LAB — GLUCOSE, CAPILLARY
GLUCOSE-CAPILLARY: 274 mg/dL — AB (ref 70–99)
Glucose-Capillary: 190 mg/dL — ABNORMAL HIGH (ref 70–99)
Glucose-Capillary: 385 mg/dL — ABNORMAL HIGH (ref 70–99)
Glucose-Capillary: 229 mg/dL — ABNORMAL HIGH (ref 70–99)

## 2014-06-11 LAB — TROPONIN I
Troponin I: <0.3 ng/mL (ref <0.30)
Troponin I: <0.3 ng/mL (ref <0.30)

## 2014-06-11 LAB — DIGOXIN LEVEL: DIGOXIN LVL: 0.5 ng/mL — AB (ref 0.8–2.0)

## 2014-06-11 MED ORDER — CARVEDILOL 12.5 MG PO TABS
25.0000 mg | ORAL_TABLET | Freq: Two times a day (BID) | ORAL | Status: DC
  Administered 2014-06-11 – 2014-06-12 (×3): 25 mg via ORAL
  Filled 2014-06-11 (×4): qty 2

## 2014-06-11 MED ORDER — ONDANSETRON HCL 4 MG PO TABS: 4.0000 mg | ORAL_TABLET | Freq: Four times a day (QID) | ORAL | Status: DC | PRN

## 2014-06-11 MED ORDER — ATORVASTATIN CALCIUM 40 MG PO TABS
80.0000 mg | ORAL_TABLET | Freq: Every day | ORAL | Status: DC
  Administered 2014-06-11 – 2014-06-12 (×2): 80 mg via ORAL
  Filled 2014-06-11 (×2): qty 2

## 2014-06-11 MED ORDER — NICOTINE 14 MG/24HR TD PT24
14.0000 mg | MEDICATED_PATCH | Freq: Every day | TRANSDERMAL | Status: DC
  Administered 2014-06-11 – 2014-06-12 (×2): 14 mg via TRANSDERMAL
  Filled 2014-06-11 (×2): qty 1

## 2014-06-11 MED ORDER — SODIUM CHLORIDE 0.9 % IV SOLN: 250.0000 mL | INTRAVENOUS | Status: DC | PRN

## 2014-06-11 MED ORDER — ALBUTEROL SULFATE (2.5 MG/3ML) 0.083% IN NEBU: 2.5000 mg | INHALATION_SOLUTION | RESPIRATORY_TRACT | Status: DC | PRN

## 2014-06-11 MED ORDER — ONDANSETRON HCL 4 MG/2ML IJ SOLN: 4.0000 mg | Freq: Four times a day (QID) | INTRAMUSCULAR | Status: DC | PRN

## 2014-06-11 MED ORDER — LISINOPRIL 5 MG PO TABS
5.0000 mg | ORAL_TABLET | Freq: Every day | ORAL | Status: DC
  Administered 2014-06-11 – 2014-06-12 (×2): 5 mg via ORAL
  Filled 2014-06-11 (×2): qty 1

## 2014-06-11 MED ORDER — SODIUM CHLORIDE 0.9 % IJ SOLN
3.0000 mL | Freq: Two times a day (BID) | INTRAMUSCULAR | Status: DC
  Administered 2014-06-11 (×2): 3 mL via INTRAVENOUS

## 2014-06-11 MED ORDER — DABIGATRAN ETEXILATE MESYLATE 150 MG PO CAPS
150.0000 mg | ORAL_CAPSULE | Freq: Two times a day (BID) | ORAL | Status: DC
  Administered 2014-06-11 – 2014-06-12 (×4): 150 mg via ORAL
  Filled 2014-06-11 (×7): qty 1

## 2014-06-11 MED ORDER — SODIUM CHLORIDE 0.9 % IJ SOLN
3.0000 mL | Freq: Two times a day (BID) | INTRAMUSCULAR | Status: DC
  Administered 2014-06-12 (×2): 3 mL via INTRAVENOUS

## 2014-06-11 MED ORDER — DIGOXIN 125 MCG PO TABS
125.0000 ug | ORAL_TABLET | Freq: Every day | ORAL | Status: DC
  Administered 2014-06-11 – 2014-06-12 (×2): 125 ug via ORAL
  Filled 2014-06-11 (×2): qty 1

## 2014-06-11 MED ORDER — INSULIN ASPART 100 UNIT/ML ~~LOC~~ SOLN
0.0000 [IU] | Freq: Three times a day (TID) | SUBCUTANEOUS | Status: DC
  Administered 2014-06-11: 8 [IU] via SUBCUTANEOUS
  Administered 2014-06-11: 2 [IU] via SUBCUTANEOUS
  Administered 2014-06-11: 15 [IU] via SUBCUTANEOUS
  Administered 2014-06-12: 3 [IU] via SUBCUTANEOUS
  Administered 2014-06-12: 5 [IU] via SUBCUTANEOUS
  Administered 2014-06-12: 11 [IU] via SUBCUTANEOUS
  Administered 2014-06-13: 5 [IU] via SUBCUTANEOUS

## 2014-06-11 MED ORDER — INSULIN ASPART 100 UNIT/ML ~~LOC~~ SOLN
0.0000 [IU] | Freq: Every day | SUBCUTANEOUS | Status: DC
  Administered 2014-06-11: 2 [IU] via SUBCUTANEOUS
  Administered 2014-06-12: 4 [IU] via SUBCUTANEOUS

## 2014-06-11 MED ORDER — ASPIRIN EC 325 MG PO TBEC
325.0000 mg | DELAYED_RELEASE_TABLET | Freq: Every day | ORAL | Status: DC
  Administered 2014-06-11 – 2014-06-12 (×2): 325 mg via ORAL
  Filled 2014-06-11 (×2): qty 1

## 2014-06-11 MED ORDER — ISOSORBIDE MONONITRATE ER 60 MG PO TB24
30.0000 mg | ORAL_TABLET | Freq: Every day | ORAL | Status: DC
  Administered 2014-06-11 – 2014-06-12 (×2): 30 mg via ORAL
  Filled 2014-06-11 (×2): qty 1

## 2014-06-11 MED ORDER — ACETAMINOPHEN 325 MG PO TABS: 650.0000 mg | ORAL_TABLET | Freq: Four times a day (QID) | ORAL | Status: DC | PRN

## 2014-06-11 MED ORDER — SODIUM CHLORIDE 0.9 % IJ SOLN: 3.0000 mL | INTRAMUSCULAR | Status: DC | PRN

## 2014-06-11 MED ORDER — INSULIN DETEMIR 100 UNIT/ML ~~LOC~~ SOLN
25.0000 [IU] | Freq: Every morning | SUBCUTANEOUS | Status: DC
  Administered 2014-06-11 – 2014-06-12 (×2): 25 [IU] via SUBCUTANEOUS
  Filled 2014-06-11 (×3): qty 0.25

## 2014-06-11 MED ORDER — ADULT MULTIVITAMIN W/MINERALS CH
1.0000 | ORAL_TABLET | Freq: Every day | ORAL | Status: DC
  Administered 2014-06-11 – 2014-06-12 (×2): 1 via ORAL
  Filled 2014-06-11 (×2): qty 1

## 2014-06-11 MED ORDER — ACETAMINOPHEN 650 MG RE SUPP: 650.0000 mg | Freq: Four times a day (QID) | RECTAL | Status: DC | PRN

## 2014-06-11 NOTE — ED Notes (Signed)
Pt states he also took 3 baby ASA PTA.

## 2014-06-11 NOTE — ED Provider Notes (Signed)
CSN: [id number]     Arrival date & time 06/11/14  0203 History   First MD Initiated Contact with Patient 06/11/14 0241     Chief Complaint  Patient presents with  . Chest Pain      Patient is a 68 y.o. male presenting with chest pain. The history is provided by the patient.  Chest Pain Pain location:  Substernal area Pain quality: pressure   Pain radiates to:  Does not radiate Pain radiates to the back: no   Pain severity:  Moderate Onset quality:  Sudden Duration:  2 hours Timing:  Constant Progression:  Resolved Chronicity:  Recurrent Relieved by:  Aspirin and nitroglycerin Worsened by:  Nothing tried Associated symptoms: shortness of breath   Associated symptoms: no abdominal pain, no fever, no syncope and not vomiting   Risk factors: coronary artery disease    Patient reports he was watching TV about 2 hrs ago when he developed onset of chest pressure He reports this is similar to prior episodes of cardiac chest pain He is now improved He reports mild SOB but other symptoms   He reports medication compliance Past Medical History  Diagnosis Date  . Coronary atherosclerosis of native coronary artery     Ant. MI in 4/95; PTCA of 90% prox & distal LAD unsuccessful-->  Urgent CABG-4/95 TO Cx; 50% RCA; ant. HK; EF of 50-55%; 08/2008: 3-V disease plus a  95% LIMA stenosis, treated with DES; occlusion of SVG to CX; RCA SVG stenosis--> PCI with DES  2010: in-stent restenosis in the LIMA-->cutting balloon; EF of 35-40%;  07/2010: TO of SVG to     RCA-medical therapy advised; EF of 30-35% by echo in 12/2010  . Atrial fibrillation 1995    1995, 2009; bradycardia with beta blocker therapy  . Tobacco abuse     40-50 pack years; currently one pack per day  . Peripheral vascular disease 1995    Abdominal aortic obstruction 1995-->vascular surgery  . Hyperlipidemia   . Type 2 diabetes mellitus   . DDD (degenerative disc disease)     Discectomy and fusion at L4 and L5  . Alcohol use  1999    Excessive alcohol use- quit in 1999  . Chronic systolic heart failure     LVEF 25-30% 2011  . Non-compliance    Past Surgical History  Procedure Laterality Date  . Cholecystectomy  1995  . Lumbar fusion      +Discectomy;x2; L4 and L5  . Aorto-femoral bypass graft  1995   Family History  Problem Relation Age of Onset  . CAD Father    History  Substance Use Topics  . Smoking status: Current Every Day Smoker -- 0.50 packs/day    Types: Cigarettes  . Smokeless tobacco: Current User     Comment: refused  . Alcohol Use: No     Comment: former    Review of Systems  Constitutional: Negative for fever.  Respiratory: Positive for shortness of breath.   Cardiovascular: Positive for chest pain. Negative for syncope.  Gastrointestinal: Negative for vomiting and abdominal pain.  All other systems reviewed and are negative.     Allergies  Review of patient's allergies indicates no known allergies.  Home Medications   Prior to Admission medications   Medication Sig Start Date End Date Taking? Authorizing Provider  aspirin EC 325 MG EC tablet Take 1 tablet (325 mg total) by mouth daily. 05/06/14   Angus Edilia BoG [name], MD  carvedilol (COREG) 25 MG tablet Take 25  mg by mouth 2 (two) times daily.    Historical Provider, MD  dabigatran (PRADAXA) 150 MG CAPS capsule Take 150 mg by mouth 2 (two) times daily.    Historical Provider, MD  digoxin (LANOXIN) 0.125 MG tablet Take 125 mcg by mouth daily.      Historical Provider, MD  furosemide (LASIX) 40 MG tablet Take 40 mg every other day. 05/06/14   Angus Edilia BoG [name], MD  insulin aspart (NOVOLOG FLEXPEN) 100 UNIT/ML FlexPen Inject 15-20 Units into the skin 3 (three) times daily with meals.     Historical Provider, MD  insulin detemir (LEVEMIR) 100 UNIT/ML injection Inject 25 Units into the skin every morning.     Historical Provider, MD  isosorbide mononitrate (IMDUR) 30 MG 24 hr tablet Take 1 tablet (30 mg total) by mouth daily. 09/08/13    Angus Edilia BoG [name], MD  lisinopril (PRINIVIL,ZESTRIL) 5 MG tablet Take 1 tablet (5 mg total) by mouth daily. 02/28/14   Antoine PocheJonathan F Branch, MD  nitroGLYCERIN (NITROSTAT) 0.4 MG SL tablet Place 1 tablet (0.4 mg total) under the tongue every 5 (five) minutes as needed. 05/24/12   Kathlen Brunswickobert M Rothbart, MD  potassium chloride SA (K-DUR,KLOR-CON) 20 MEQ tablet Take 1 tablet (20 mEq total) by mouth 2 (two) times daily. 12/27/13   Jodelle GrossKathryn M Lawrence, NP  rosuvastatin (CRESTOR) 40 MG tablet Take 40 mg by mouth daily.    Historical Provider, MD   BP 126/59  Pulse 72  Temp(Src) 97.9 F (36.6 C) (Oral)  Resp 16  SpO2 100% Physical Exam CONSTITUTIONAL: chronically ill appearing, no distress noted HEAD: Normocephalic/atraumatic EYES: EOMI/PERRL ENMT: Mucous membranes moist NECK: supple no meningeal signs EA:[id number]:irregular, no loud murmurs noted LUNGS: Lungs are clear to auscultation bilaterally ABDOMEN: soft, nontender, no rebound or guarding NEURO: Pt is awake/alert, moves all extremitiesx4 EXTREMITIES: pulses normal, full ROM, no LE edema noted, no calf tenderness. SKIN: warm, color normal PSYCH: no abnormalities of mood noted  ED Course  Procedures   3:37 AM Pt with long h/o CAD (s/p CABG) and per records from most recent cath 11/2013, he is only amenable to medical therapy as there no targets for re-vascularization.  He is currently CP (he already had ASA/NTG) Pt currently stable 4:08 AM Pt without any recurrent CP Will admit for further cardiac monitoring D/w dr Osvaldo ShipperGokul Krishnan, will admit for observation  Labs Review Labs Reviewed  CBC WITH DIFFERENTIAL - Abnormal; Notable for the following:    WBC 11.9 (*)    Neutro Abs 7.9 (*)    Eosinophils Relative 7 (*)    Eosinophils Absolute 0.8 (*)    All other components within normal limits  COMPREHENSIVE METABOLIC PANEL - Abnormal; Notable for the following:    Glucose, Bld 221 (*)    BUN 26 (*)    Creatinine, Ser 1.46 (*)    Total Bilirubin 0.2  (*)    GFR calc non Af Amer 48 (*)    GFR calc Af Amer 56 (*)    All other components within normal limits  PRO B NATRIURETIC PEPTIDE - Abnormal; Notable for the following:    Pro B Natriuretic peptide (BNP) 2933.0 (*)    All other components within normal limits  DIGOXIN LEVEL - Abnormal; Notable for the following:    Digoxin Level 0.5 (*)    All other components within normal limits  TROPONIN I    Imaging Review Dg Chest Portable 1 View  06/11/2014   CLINICAL DATA:  Chest pain.  EXAM: PORTABLE CHEST - 1 VIEW  COMPARISON:  Prior radiograph from 05/30/2014  FINDINGS: Median sternotomy wires with underlying surgical clips and CABG markers again noted, unchanged. Cardiomegaly is stable. Mediastinal silhouette within normal limits.  The lungs are normally inflated. Pulmonary vascularity is mildly engorged without pulmonary edema. No focal infiltrate. No definite pleural effusion. There is no pneumothorax.  No acute osseous abnormality identified.  Diffuse osteopenia noted.  IMPRESSION: Stable cardiomegaly without pulmonary edema or other acute cardiopulmonary abnormality.   Electronically Signed   By: Rise Mu M.D.   On: 06/11/2014 03:48     EKG Interpretation   Date/Time:  Sunday June 11 2014 02:10:09 EDT Ventricular Rate:  70 PR Interval:    QRS Duration: 87 QT Interval:  372 QTC Calculation: 401 R Axis:   67 Text Interpretation:  Atrial fibrillation Probable anterior infarct, age  indeterminate No significant change since last tracing Confirmed by  Bebe Shaggy  MD, Lynell Greenhouse ([id number]) on 06/11/2014 2:16:03 AM      MDM   Final diagnoses:  Chest pain, rule out acute myocardial infarction  Renal insufficiency  Hyperglycemia  Atrial fibrillation, chronic     Nursing notes including past medical history and social history reviewed and considered in documentation xrays reviewed and considered Labs/vital reviewed and considered Previous records reviewed and  considered     Joya Gaskins, MD 06/11/14 [phone]

## 2014-06-11 NOTE — Progress Notes (Signed)
MEDICATION RELATED CONSULT NOTE - INITIAL   Pharmacy Consult for Pradaxa (renal adjustment) Indication: h/o afib, chronic anticoagulation  No Known Allergies  Patient Measurements: Height: 5\' 9"  (175.3 cm) Weight: 165 lb (74.844 kg) IBW/kg (Calculated) : 70.7  Vital Signs: Temp: 98.7 F (37.1 C) (06/21 0454) Temp src: Oral (06/21 0454) BP: 115/75 mmHg (06/21 0454) Pulse Rate: 72 (06/21 0809) Intake/Output from previous day: 06/20 0701 - 06/21 0700 In: 240 [P.O.:240] Out: -  Intake/Output from this shift:    Labs:  Recent Labs  06/11/14 0229  WBC 11.9*  HGB 14.2  HCT 40.8  PLT 171  CREATININE 1.46*  ALBUMIN 3.6  PROT 6.9  AST 14  ALT 22  ALKPHOS 69  BILITOT 0.2*   Estimated Creatinine Clearance: 49.1 ml/min (by C-G formula based on Cr of 1.46).  Microbiology: No results found for this or any previous visit (from the past 720 hour(s)).  Medical History: Past Medical History  Diagnosis Date  . Coronary atherosclerosis of native coronary artery     Ant. MI in 4/95; PTCA of 90% prox & distal LAD unsuccessful-->  Urgent CABG-4/95 TO Cx; 50% RCA; ant. HK; EF of 50-55%; 08/2008: 3-V disease plus a  95% LIMA stenosis, treated with DES; occlusion of SVG to CX; RCA SVG stenosis--> PCI with DES  2010: in-stent restenosis in the LIMA-->cutting balloon; EF of 35-40%;  07/2010: TO of SVG to     RCA-medical therapy advised; EF of 30-35% by echo in 12/2010  . Atrial fibrillation 1995    1995, 2009; bradycardia with beta blocker therapy  . Tobacco abuse     40-50 pack years; currently one pack per day  . Peripheral vascular disease 1995    Abdominal aortic obstruction 1995-->vascular surgery  . Hyperlipidemia   . Type 2 diabetes mellitus   . DDD (degenerative disc disease)     Discectomy and fusion at L4 and L5  . Alcohol use 1999    Excessive alcohol use- quit in 1999  . Chronic systolic heart failure     LVEF 25-30% 2011  . Non-compliance     Medications:   Prescriptions prior to admission  Medication Sig Dispense Refill  . aspirin EC 325 MG EC tablet Take 1 tablet (325 mg total) by mouth daily.  30 tablet  0  . carvedilol (COREG) 25 MG tablet Take 25 mg by mouth 2 (two) times daily.      . dabigatran (PRADAXA) 150 MG CAPS capsule Take 150 mg by mouth 2 (two) times daily.      . digoxin (LANOXIN) 0.125 MG tablet Take 125 mcg by mouth daily.        . furosemide (LASIX) 40 MG tablet Take 40 mg every other day.  30 tablet  3  . insulin aspart (NOVOLOG FLEXPEN) 100 UNIT/ML FlexPen Inject 15-20 Units into the skin 3 (three) times daily with meals.       . insulin detemir (LEVEMIR) 100 UNIT/ML injection Inject 25 Units into the skin every morning.       . isosorbide mononitrate (IMDUR) 30 MG 24 hr tablet Take 1 tablet (30 mg total) by mouth daily.  30 tablet  5  . lisinopril (PRINIVIL,ZESTRIL) 5 MG tablet Take 1 tablet (5 mg total) by mouth daily.  90 tablet  3  . nitroGLYCERIN (NITROSTAT) 0.4 MG SL tablet Place 1 tablet (0.4 mg total) under the tongue every 5 (five) minutes as needed.  25 tablet  6  . potassium chloride  SA (K-DUR,KLOR-CON) 20 MEQ tablet Take 1 tablet (20 mEq total) by mouth 2 (two) times daily.  60 tablet  6  . rosuvastatin (CRESTOR) 40 MG tablet Take 40 mg by mouth daily.        Assessment: 68yo male with h/o afib on Pradaxa PTA.  Pt has elevated SCr on admission.  Estimated Creatinine Clearance: 49.1 ml/min (by C-G formula based on Cr of 1.46).   Renal fxn is borderline for dose reduction (clcr 30-50).    Goal of Therapy:  Anticoagulation for afib, appropriate dosing of Pradaxa  Plan:  Continue Pradaxa 150mg  PO q12hrs F/U SCr tomorrow to evaluate, reduce dose if SCr worse Monitor for s/sx of bleeding complications  Valrie HartHall, Scott A 06/11/2014,8:26 AM

## 2014-06-11 NOTE — H&P (Signed)
[facility] History and Physical  SHIELDS PAUTZ TGG:[id number] DOB: 1946/11/12 DOA: 06/11/2014   PCP: Lanette Hampshire, MD  Specialists: Followed by cardiology here in [place]  Chief Complaint: Chest pain at rest  HPI: [name] is a 68 y.o. male with a past medical history of coronary artery disease, status post CABG with the most recent catheterization in 2014, which showed stable disease. Patient was considered candidate for medical management only. He also has a history of diabetes on insulin, hypertension, tobacco abuse, who was in his usual state of health last night when he was sitting watching television when he started experiencing a pressure-like sensation in the central part of his chest. It was 8/10 in intensity. There was no radiation of this pain. Did not have any shortness of breath, or dizziness. No nausea, vomiting. No diaphoresis. He did not take any medications at home and decided to come in to the hospital. Denies any leg swelling. He tells me that he gets chest pain once in a while. He was given nitroglycerin by EMS with a drop in his blood pressure. His pain subsequently resolved. Currently denies any chest pain.  Home Medications: Prior to Admission medications   Medication Sig Start Date End Date Taking? Authorizing Provider  aspirin EC 325 MG EC tablet Take 1 tablet (325 mg total) by mouth daily. 05/06/14   Angus Ailene Ravel, MD  carvedilol (COREG) 25 MG tablet Take 25 mg by mouth 2 (two) times daily.    Historical Provider, MD  dabigatran (PRADAXA) 150 MG CAPS capsule Take 150 mg by mouth 2 (two) times daily.    Historical Provider, MD  digoxin (LANOXIN) 0.125 MG tablet Take 125 mcg by mouth daily.      Historical Provider, MD  furosemide (LASIX) 40 MG tablet Take 40 mg every other day. 05/06/14   Angus Ailene Ravel, MD  insulin aspart (NOVOLOG FLEXPEN) 100 UNIT/ML FlexPen Inject 15-20 Units into the skin 3 (three) times daily with meals.     Historical  Provider, MD  insulin detemir (LEVEMIR) 100 UNIT/ML injection Inject 25 Units into the skin every morning.     Historical Provider, MD  isosorbide mononitrate (IMDUR) 30 MG 24 hr tablet Take 1 tablet (30 mg total) by mouth daily. 09/08/13   Angus Ailene Ravel, MD  lisinopril (PRINIVIL,ZESTRIL) 5 MG tablet Take 1 tablet (5 mg total) by mouth daily. 02/28/14   Arnoldo Lenis, MD  nitroGLYCERIN (NITROSTAT) 0.4 MG SL tablet Place 1 tablet (0.4 mg total) under the tongue every 5 (five) minutes as needed. 05/24/12   Yehuda Savannah, MD  potassium chloride SA (K-DUR,KLOR-CON) 20 MEQ tablet Take 1 tablet (20 mEq total) by mouth 2 (two) times daily. 12/27/13   Lendon Colonel, NP  rosuvastatin (CRESTOR) 40 MG tablet Take 40 mg by mouth daily.    Historical Provider, MD    Allergies: No Known Allergies  Past Medical History: Past Medical History  Diagnosis Date  . Coronary atherosclerosis of native coronary artery     Ant. MI in 4/95; PTCA of 90% prox & distal LAD unsuccessful-->  Urgent CABG-4/95 TO Cx; 50% RCA; ant. HK; EF of 50-55%; 08/2008: 3-V disease plus a  95% LIMA stenosis, treated with DES; occlusion of SVG to CX; RCA SVG stenosis--> PCI with DES  2010: in-stent restenosis in the LIMA-->cutting balloon; EF of 35-40%;  07/2010: TO of SVG to     RCA-medical therapy advised; EF of 30-35% by echo in 12/2010  .  Atrial fibrillation 1995    1995, 2009; bradycardia with beta blocker therapy  . Tobacco abuse     40-50 pack years; currently one pack per day  . Peripheral vascular disease 1995    Abdominal aortic obstruction 1995-->vascular surgery  . Hyperlipidemia   . Type 2 diabetes mellitus   . DDD (degenerative disc disease)     Discectomy and fusion at L4 and L5  . Alcohol use 1999    Excessive alcohol use- quit in 1999  . Chronic systolic heart failure     LVEF 25-30% 2011  . Non-compliance     Past Surgical History  Procedure Laterality Date  . Cholecystectomy  1995  . Lumbar fusion       +Discectomy;x2; L4 and L5  . Aorto-femoral bypass graft  1995    Social History: Patient lives in [place] with his wife. He smokes one pack of cigarettes on a daily basis. No alcohol use. No illicit drug use. Uses a cane to ambulate.  Family History:  Family History  Problem Relation Age of Onset  . CAD Father      Review of Systems - History obtained from the patient General ROS: negative Psychological ROS: negative Ophthalmic ROS: negative ENT ROS: negative Allergy and Immunology ROS: negative Hematological and Lymphatic ROS: negative Endocrine ROS: negative Respiratory ROS: no cough, shortness of breath, or wheezing Cardiovascular ROS: as in hpi Gastrointestinal ROS: no abdominal pain, change in bowel habits, or black or bloody stools Genito-Urinary ROS: no dysuria, trouble voiding, or hematuria Musculoskeletal ROS: negative Neurological ROS: no TIA or stroke symptoms Dermatological ROS: negative  Physical Examination  Filed Vitals:   06/11/14 0212 06/11/14 0300 06/11/14 0330  BP: 126/59 121/89 107/74  Pulse: 72 74 76  Temp: 97.9 F (36.6 C)    TempSrc: Oral    Resp: $Remo'16 20 18  'PSVkY$ SpO2: 100% 98% 99%    BP 107/74  Pulse 76  Temp(Src) 97.9 F (36.6 C) (Oral)  Resp 18  SpO2 99%  General appearance: alert, cooperative and unkempt Head: Normocephalic, without obvious abnormality, atraumatic Eyes: conjunctivae/corneas clear. PERRL, EOM's intact. Throat: lips, mucosa, and tongue normal; teeth and gums normal Neck: no adenopathy, no carotid bruit, no JVD, supple, symmetrical, trachea midline and thyroid not enlarged, symmetric, no tenderness/mass/nodules Resp: clear to auscultation bilaterally Cardio: irregular rhythm, no murmur, click, rub or gallop GI: soft, non-tender; bowel sounds normal; no masses,  no organomegaly Extremities: extremities normal, atraumatic, no cyanosis or edema Pulses: 2+ and symmetric Skin: Skin color, texture, turgor normal. No rashes or  lesions Lymph nodes: Cervical, supraclavicular, and axillary nodes normal. Neurologic: No focal deficits.  Laboratory Data: Results for orders placed during the hospital encounter of 06/11/14 (from the past 48 hour(s))  TROPONIN I     Status: None   Collection Time    06/11/14  2:29 AM      Result Value Ref Range   Troponin I <0.30  <0.30 ng/mL   Comment:            Due to the release kinetics of cTnI,     a negative result within the first hours     of the onset of symptoms does not rule out     myocardial infarction with certainty.     If myocardial infarction is still suspected,     repeat the test at appropriate intervals.  CBC WITH DIFFERENTIAL     Status: Abnormal   Collection Time    06/11/14  2:29  AM      Result Value Ref Range   WBC 11.9 (*) 4.0 - 10.5 K/uL   RBC 4.42  4.22 - 5.81 MIL/uL   Hemoglobin 14.2  13.0 - 17.0 g/dL   HCT 40.8  39.0 - 52.0 %   MCV 92.3  78.0 - 100.0 fL   MCH 32.1  26.0 - 34.0 pg   MCHC 34.8  30.0 - 36.0 g/dL   RDW 12.5  11.5 - 15.5 %   Platelets 171  150 - 400 K/uL   Neutrophils Relative % 66  43 - 77 %   Neutro Abs 7.9 (*) 1.7 - 7.7 K/uL   Lymphocytes Relative 19  12 - 46 %   Lymphs Abs 2.2  0.7 - 4.0 K/uL   Monocytes Relative 7  3 - 12 %   Monocytes Absolute 0.8  0.1 - 1.0 K/uL   Eosinophils Relative 7 (*) 0 - 5 %   Eosinophils Absolute 0.8 (*) 0.0 - 0.7 K/uL   Basophils Relative 1  0 - 1 %   Basophils Absolute 0.1  0.0 - 0.1 K/uL  COMPREHENSIVE METABOLIC PANEL     Status: Abnormal   Collection Time    06/11/14  2:29 AM      Result Value Ref Range   Sodium 141  137 - 147 mEq/L   Potassium 4.2  3.7 - 5.3 mEq/L   Chloride 103  96 - 112 mEq/L   CO2 23  19 - 32 mEq/L   Glucose, Bld 221 (*) 70 - 99 mg/dL   BUN 26 (*) 6 - 23 mg/dL   Creatinine, Ser 1.46 (*) 0.50 - 1.35 mg/dL   Calcium 9.2  8.4 - 10.5 mg/dL   Total Protein 6.9  6.0 - 8.3 g/dL   Albumin 3.6  3.5 - 5.2 g/dL   AST 14  0 - 37 U/L   ALT 22  0 - 53 U/L   Alkaline  Phosphatase 69  39 - 117 U/L   Total Bilirubin 0.2 (*) 0.3 - 1.2 mg/dL   GFR calc non Af Amer 48 (*) >90 mL/min   GFR calc Af Amer 56 (*) >90 mL/min   Comment: (NOTE)     The eGFR has been calculated using the CKD EPI equation.     This calculation has not been validated in all clinical situations.     eGFR's persistently <90 mL/min signify possible Chronic Kidney     Disease.  PRO B NATRIURETIC PEPTIDE     Status: Abnormal   Collection Time    06/11/14  2:29 AM      Result Value Ref Range   Pro B Natriuretic peptide (BNP) 2933.0 (*) 0 - 125 pg/mL  DIGOXIN LEVEL     Status: Abnormal   Collection Time    06/11/14  2:29 AM      Result Value Ref Range   Digoxin Level 0.5 (*) 0.8 - 2.0 ng/mL    Radiology Reports: Dg Chest Portable 1 View  06/11/2014   CLINICAL DATA:  Chest pain.  EXAM: PORTABLE CHEST - 1 VIEW  COMPARISON:  Prior radiograph from 05/30/2014  FINDINGS: Median sternotomy wires with underlying surgical clips and CABG markers again noted, unchanged. Cardiomegaly is stable. Mediastinal silhouette within normal limits.  The lungs are normally inflated. Pulmonary vascularity is mildly engorged without pulmonary edema. No focal infiltrate. No definite pleural effusion. There is no pneumothorax.  No acute osseous abnormality identified.  Diffuse osteopenia noted.  IMPRESSION:  Stable cardiomegaly without pulmonary edema or other acute cardiopulmonary abnormality.   Electronically Signed   By: Jeannine Boga M.D.   On: 06/11/2014 03:48    Electrocardiogram: Atrial fibrillation at 70 beats per minute. Normal axis. T waves are inverted in the inferior leads. Nonspecific ST changes in V5, V6. However, these changes appear to be chronic.  Problem List  Principal Problem:   Chest pain, rule out acute myocardial infarction Active Problems:   DIABETES MELLITUS, TYPE II   Arteriosclerotic cardiovascular disease (ASCVD)   Atrial fibrillation   Chronic anticoagulation   Chronic  combined systolic and diastolic CHF, NYHA class 3   Assessment: This is a 68 year old, Caucasian male, with a past medical history as stated earlier, presents with chest pressure at rest. He has a history of coronary artery disease. This is most likely angina. Other differentials include GI musculoskeletal. Does not appear to be pulmonary. He denies any acid reflux. His chest pain is resolved with NTG. Although he did drop his blood pressure, which is now recovered.  Plan: #1 chest pain in the setting of known coronary artery disease: He'll be observed. Serial troponins will be obtained. Consideration should, be given to increasing the dose of his imdur. Review of his previous notes show that patient was admitted with hypotension related syncope in May. He appears to be quite sensitive to nitrates. So, I would defer this issue to his cardiologist. This can be addressed in the outpatient setting. Continue with his other cardiac medications. Continue with current dose of Imdur. Per Cardiac catheterization in December he is on medical management.  #2 mildly elevated creatinine: This could be his baseline, though it's difficult to say, based on previous reports. I will hold his Lasix for now. Repeat his renal function in the morning. I will however continue with his ACE inhibitor considering his history of ischemic cardiomyopathy. Monitor urine output closely.  #3 history of chronic systolic and diastolic congestive heart failure: Appears to be well compensated at this time. Continue with his ACE inhibitor and his beta blocker. Monitor renal function closely.  #4 history of atrial fibrillation on chronic anticoagulation: Continue with Pradaxa. Monitor Renal Function Closely. Rate appears to be reasonably well controlled.  #5 History of Tobacco Abuse: Nicotine Patch will be prescribed.  #6 History of Diabetes Mellitus, Type II: Continue with his insulin regimen. Sliding Scale Insulin Coverage.    DVT  Prophylaxis: He is on full anticoagulation Code Status: Full code Family Communication: No family at bedside. Discussed with the patient  Disposition Plan: Observe to telemetry. Dr. Everette Rank to assume care in the morning.   Further management decisions will depend on results of further testing and patient's response to treatment.   [facility]  [facility] Pager [phone]  If 7PM-7AM, please contact night-coverage [email or web address] Password [facility]  06/11/2014, 4:26 AM  Disclaimer: This note was dictated with voice recognition software. Similar sounding words can inadvertently be transcribed and may not be corrected upon review.

## 2014-06-11 NOTE — Progress Notes (Signed)
Spoke with Dr. Karilyn CotaGosrani on call MD, made aware of pt's frequent brief episodes of Afib with slowed ventricular rate as low as the 30s, rate returns to high 40s and 50s quickly. Vital signs are stable. Pt asymptomatic with no complaints of dizziness, pain or SOB; cardiology consult pending. Informed by MD to page if pt becomes symptomatic or sustained. Will continue to monitor.  Vincent GalaDarlene Braggs, RN-BC 06/11/2014 10:57 PM

## 2014-06-11 NOTE — ED Notes (Signed)
Per EMS: Pt c/o chest pain to center of chest since midnight with no radiation; Pt received 3 Nitro's in the field; After the third nitro BP dropped to 90/62 and then 95/48. Raised legs and BP raised with systolic above 120.

## 2014-06-12 DIAGNOSIS — I209 Angina pectoris, unspecified: Secondary | ICD-10-CM

## 2014-06-12 DIAGNOSIS — F17200 Nicotine dependence, unspecified, uncomplicated: Secondary | ICD-10-CM

## 2014-06-12 DIAGNOSIS — T50904A Poisoning by unspecified drugs, medicaments and biological substances, undetermined, initial encounter: Secondary | ICD-10-CM

## 2014-06-12 DIAGNOSIS — N289 Disorder of kidney and ureter, unspecified: Secondary | ICD-10-CM

## 2014-06-12 DIAGNOSIS — Z91199 Patient's noncompliance with other medical treatment and regimen due to unspecified reason: Secondary | ICD-10-CM

## 2014-06-12 DIAGNOSIS — I25810 Atherosclerosis of coronary artery bypass graft(s) without angina pectoris: Secondary | ICD-10-CM

## 2014-06-12 DIAGNOSIS — I9589 Other hypotension: Secondary | ICD-10-CM

## 2014-06-12 DIAGNOSIS — E785 Hyperlipidemia, unspecified: Secondary | ICD-10-CM

## 2014-06-12 DIAGNOSIS — Z9119 Patient's noncompliance with other medical treatment and regimen: Secondary | ICD-10-CM

## 2014-06-12 DIAGNOSIS — Z7901 Long term (current) use of anticoagulants: Secondary | ICD-10-CM

## 2014-06-12 DIAGNOSIS — R7309 Other abnormal glucose: Secondary | ICD-10-CM

## 2014-06-12 DIAGNOSIS — I739 Peripheral vascular disease, unspecified: Secondary | ICD-10-CM

## 2014-06-12 LAB — MAGNESIUM: MAGNESIUM: 2 mg/dL (ref 1.5–2.5)

## 2014-06-12 LAB — COMPREHENSIVE METABOLIC PANEL
ALT: 19 U/L (ref 0–53)
AST: 14 U/L (ref 0–37)
Albumin: 3.6 g/dL (ref 3.5–5.2)
Alkaline Phosphatase: 69 U/L (ref 39–117)
BILIRUBIN TOTAL: 0.3 mg/dL (ref 0.3–1.2)
BUN: 19 mg/dL (ref 6–23)
CHLORIDE: 101 meq/L (ref 96–112)
CO2: 26 mEq/L (ref 19–32)
CREATININE: 1.02 mg/dL (ref 0.50–1.35)
Calcium: 9.5 mg/dL (ref 8.4–10.5)
GFR calc Af Amer: 86 mL/min — ABNORMAL LOW (ref >90)
GFR, EST NON AFRICAN AMERICAN: 74 mL/min — AB (ref >90)
Glucose, Bld: 173 mg/dL — ABNORMAL HIGH (ref 70–99)
Potassium: 4.5 mEq/L (ref 3.7–5.3)
SODIUM: 140 meq/L (ref 137–147)
Total Protein: 7 g/dL (ref 6.0–8.3)

## 2014-06-12 LAB — CBC
HCT: 43.7 % (ref 39.0–52.0)
Hemoglobin: 15.1 g/dL (ref 13.0–17.0)
MCH: 31.7 pg (ref 26.0–34.0)
MCHC: 34.6 g/dL (ref 30.0–36.0)
MCV: 91.8 fL (ref 78.0–100.0)
Platelets: 173 10*3/uL (ref 150–400)
RBC: 4.76 MIL/uL (ref 4.22–5.81)
RDW: 12.4 % (ref 11.5–15.5)
WBC: 10.6 10*3/uL — ABNORMAL HIGH (ref 4.0–10.5)

## 2014-06-12 LAB — GLUCOSE, CAPILLARY
Glucose-Capillary: 198 mg/dL — ABNORMAL HIGH (ref 70–99)
Glucose-Capillary: 250 mg/dL — ABNORMAL HIGH (ref 70–99)
Glucose-Capillary: 342 mg/dL — ABNORMAL HIGH (ref 70–99)
Glucose-Capillary: 320 mg/dL — ABNORMAL HIGH (ref 70–99)

## 2014-06-12 LAB — PHOSPHORUS: PHOSPHORUS: 3.7 mg/dL (ref 2.3–4.6)

## 2014-06-12 MED ORDER — RANOLAZINE ER 500 MG PO TB12
500.0000 mg | ORAL_TABLET | Freq: Two times a day (BID) | ORAL | Status: DC
  Administered 2014-06-12 (×2): 500 mg via ORAL
  Filled 2014-06-12 (×5): qty 1

## 2014-06-12 MED ORDER — FUROSEMIDE 20 MG PO TABS
20.0000 mg | ORAL_TABLET | Freq: Every day | ORAL | Status: DC
  Administered 2014-06-12: 20 mg via ORAL
  Filled 2014-06-12: qty 1

## 2014-06-12 NOTE — Consult Note (Signed)
The patient was seen and examined, and I agree with the assessment and plan as documented above, with modifications as noted below. Pt with CAD/CABG not amenable to PCI, admitted with chest pain and has subsequently ruled out for an ACS. He currently denies any chest pain or shortness of breath. Saw Dr. Wyline MoodBranch earlier this month, who considered starting Ranexa at next office visit. He is not able to tolerate higher doses of nitrates due to hypotensive response. I agree with a trial of Ranexa 500 mg bid. He is stable from the standpoints of atrial fibrillation and heart failure. Stable for discharge from my standpoint.

## 2014-06-12 NOTE — Progress Notes (Signed)
Notified by telemetry that patient had 3.2 second pause, underlying rhythm continues to be Afib with slowed ventricular rate. Pt resting quietly, asymptomatic, becomes agitated with staff entering room, education provided regarding plan of care, pt verbalized understanding. Pt denies any complaints, vital signs stable, see doc flowsheet. Spoke with Dr. Karilyn CotaGosrani on call, telephone order received to add on magnesium and phosphorous labs to morning labs, 12 lead EKG okay to be completed at 0500 as previously ordered. Will continue to monitor.  Darlene Braggs, RN-BC 06/12/2014 1:42 AM

## 2014-06-12 NOTE — Progress Notes (Signed)
Inpatient Diabetes Program Recommendations  AACE/ADA: New Consensus Statement on Inpatient Glycemic Control (2013)  Target Ranges:  Prepandial:   less than 140 mg/dL      Peak postprandial:   less than 180 mg/dL (1-2 hours)      Critically ill patients:  140 - 180 mg/dL   Results for [name], [name] (MRN [id number]) as of 06/12/2014 07:42  Ref. Range 06/11/2014 07:50 06/11/2014 12:29 06/11/2014 16:51 06/11/2014 21:34  Glucose-Capillary Latest Range: 70-99 mg/dL 409190 (H) 811385 (H) 914274 (H) 229 (H)   Diabetes history: DM2 Outpatient Diabetes medications: Levemir 25 units QAM, Novolog 15-20 units TID with meals Current orders for Inpatient glycemic control: Levemir 25 units QAM, Novolog 0-15 units AC, Novolog 0-5 units HS  Inpatient Diabetes Program Recommendations Insulin - Basal: Please consider increasing Lantus to 27 units QAM. Correction (SSI): Please consider increasing Novolog correction to resistant scale. Insulin - Meal Coverage: Please consider ordering Novolog 6 units TID with meals for meal coverage (in addition to Novolog correction scale).  Thanks, Orlando PennerMarie Byrd, RN, MSN, CCRN Diabetes Coordinator Inpatient Diabetes Program [phone] (Team Pager) [phone] ([facility] office) [phone] [facility] office)

## 2014-06-12 NOTE — Progress Notes (Signed)
Utilization review completed.  

## 2014-06-12 NOTE — Consult Note (Signed)
CARDIOLOGY CONSULT NOTE   Patient ID: Cala Bradfordugene F Todt MRN: [id number] DOB/AGE: [birth year] 68 y.o.  Admit Date: 06/11/2014 Referring Physician: Butch PennyMcInnis, Angus MD Primary Physician: Alice ReichertMCINNIS,ANGUS G, MD Consulting Cardiologist: Ermalene SearingKonewaran, Suresh MD Primary Cardiologist Dina RichBranch, Jonathan MD Reason for Consultation:   Clinical Summary [name] is a 68 y.o.male admitted with recurrent chest pain with known history of ischemic cardiomyopathy, LVEF 35-40% per echo in May 2015, prior coronary artery bypass grafting 1995 with subsequent stenting 2009 2011, atrial fibrillation,(on Pradaxa) chronic chest pain started on Imdur, with limited use of nitrates due to hypotensive response. Was last seen by Dr. Wyline MoodBranch on 06/07/2014 with ongoing medical management only due to recent cardiac catheterization in December 2014, with no ammendable areas for revascularization. Consideration for Ranexa therapy was discussed the pain continued.  He states the pain is no different than what he always experiences. The pain is chronic in the middle of his chest, with associated shortness of breath. It was not more intense, or longer lasting. But because he continued to recur he came to the emergency room. He unfortunately continues to smoke heavily, and was smoking during his episode of chest discomfort. He denies medical noncompliance. He is angry and frustrated and wants to go home.  On arrival in the emergency room the patient's blood pressure is 126/59, heart rate 72, respirations 16 with an O2 sat of 100%. He had mildly elevated white blood cells 11.9, he was hyperglycemic with a glucose of 221, BUN 26, creatinine 1.46. Cardiac enzymes are negative x3. Pro BNP was abnormal at 2933. His digitoxin level was low at 0.5. Chest x-ray revealed stable cardiomegaly without pulmonary edema or other acute abnormalities. EKG revealed atrial fibrillation with no evidence of ACS.  Other history includes diabetes, CHF, medical  noncompliance. No Known Allergies  Medications Scheduled Medications: . aspirin EC  325 mg Oral Daily  . atorvastatin  80 mg Oral q1800  . carvedilol  25 mg Oral BID  . dabigatran  150 mg Oral BID  . digoxin  125 mcg Oral Daily  . furosemide  20 mg Oral Daily  . insulin aspart  0-15 Units Subcutaneous TID WC  . insulin aspart  0-5 Units Subcutaneous QHS  . insulin detemir  25 Units Subcutaneous q morning - 10a  . isosorbide mononitrate  30 mg Oral Daily  . lisinopril  5 mg Oral Daily  . multivitamin with minerals  1 tablet Oral Daily  . nicotine  14 mg Transdermal Daily  . sodium chloride  3 mL Intravenous Q12H  . sodium chloride  3 mL Intravenous Q12H    Infusions:    PRN Medications: sodium chloride, acetaminophen, acetaminophen, albuterol, ondansetron (ZOFRAN) IV, ondansetron, sodium chloride   Past Medical History  Diagnosis Date  . Coronary atherosclerosis of native coronary artery     Ant. MI in 4/95; PTCA of 90% prox & distal LAD unsuccessful-->  Urgent CABG-4/95 TO Cx; 50% RCA; ant. HK; EF of 50-55%; 08/2008: 3-V disease plus a  95% LIMA stenosis, treated with DES; occlusion of SVG to CX; RCA SVG stenosis--> PCI with DES  2010: in-stent restenosis in the LIMA-->cutting balloon; EF of 35-40%;  07/2010: TO of SVG to     RCA-medical therapy advised; EF of 30-35% by echo in 12/2010  . Atrial fibrillation 1995    1995, 2009; bradycardia with beta blocker therapy  . Tobacco abuse     40-50 pack years; currently one pack per day  . Peripheral vascular disease  1995    Abdominal aortic obstruction 1995-->vascular surgery  . Hyperlipidemia   . Type 2 diabetes mellitus   . DDD (degenerative disc disease)     Discectomy and fusion at L4 and L5  . Alcohol use 1999    Excessive alcohol use- quit in 1999  . Chronic systolic heart failure     LVEF 25-30% 2011  . Non-compliance     Past Surgical History  Procedure Laterality Date  . Cholecystectomy  1995  . Lumbar fusion       +Discectomy;x2; L4 and L5  . Aorto-femoral bypass graft  1995    Family History  Problem Relation Age of Onset  . CAD Father     Social History Mr. Goodchild reports that he has been smoking Cigarettes.  He has a 40 pack-year smoking history. He uses smokeless tobacco. Mr. Quattrone reports that he does not drink alcohol.  Review of Systems Otherwise reviewed and negative except as outlined.  Physical Examination Blood pressure 126/81, pulse 58, temperature 98.1 F (36.7 C), temperature source Oral, resp. rate 20, height 5\' 9"  (1.753 m), weight 162 lb 12.7 oz (73.844 kg), SpO2 99.00%.  Intake/Output Summary (Last 24 hours) at 06/12/14 0925 Last data filed at 06/11/14 2141  Gross per 24 hour  Intake    530 ml  Output      0 ml  Net    530 ml    Telemetry: Normal sinus rhythm sinus bradycardia in the 60s  GEN:.Angry, frustrated, denies pain. HEENT: Conjunctiva and lids normal, oropharynx clear with moist mucosa. Neck: Supple, no elevated JVP or carotid bruits, no thyromegaly. Lungs: Clear to auscultation, nonlabored breathing at rest. Cardiac: Regular rate and rhythm, no S3 or significant systolic murmur, no pericardial rub. Abdomen: Soft, nontender, no hepatomegaly, bowel sounds present, no guarding or rebound. Extremities: No pitting edema, distal pulses 2+. Skin: Warm and dry. Musculoskeletal: No kyphosis. Neuropsychiatric: Alert and oriented x3, affect grossly appropriate.  Prior Cardiac Testing/Procedures 1. Cardiac Cath 01/31/2010  IMPRESSION: 1. Triple vessel coronary artery disease, status post three-vessel     coronary artery bypass graft with 2/3 patent bypass grafts. 2. Severe stenosis in the stent that had been placed in the ostium of     the left internal mammary artery bypass graft to the left anterior     descending.  This is now status post successful cutting balloon     angioplasty. 3. Severe stenosis in the proximal body of the saphenous vein graft in      the site of a prior placed stent.  This is now status post     successful percutaneous coronary intervention with placement of a     new drug-eluting stent extending from the ostium down into the     prior placed stent in the proximal body of the saphenous vein     graft. 4. Segmental left ventricular dysfunction.   2.  Echo 04/24/2014 - Study data: Technically difficult study. - Left ventricle: The cavity size was normal. Wall thickness was increased increased in a pattern of mild to moderate LVH. Systolic function was moderately to severely reduced. The estimated ejection fraction was in the range of 35% to 40%. Compared to prior study 09/07/13 LVEF has improved. - Aortic valve: Mildly calcified annulus. Trileaflet; mildly thickened leaflets. Valve area: 1.65cm^2(VTI). Valve area: 1.76cm^2 (Vmax). - Mitral valve: Mildly calcified annulus. Mildly thickened leaflets . Mild regurgitation. - Left atrium: The atrium was severely dilated. - Right ventricle: The cavity size  was mildly dilated. - Right atrium: The atrium was severely dilated.   Lab Results  Basic Metabolic Panel:  Recent Labs Lab 06/11/14 0229 06/12/14 0518  NA 141 140  K 4.2 4.5  CL 103 101  CO2 23 26  GLUCOSE 221* 173*  BUN 26* 19  CREATININE 1.46* 1.02  CALCIUM 9.2 9.5  MG  --  2.0  PHOS  --  3.7    Liver Function Tests:  Recent Labs Lab 06/11/14 0229 06/12/14 0518  AST 14 14  ALT 22 19  ALKPHOS 69 69  BILITOT 0.2* 0.3  PROT 6.9 7.0  ALBUMIN 3.6 3.6    CBC:  Recent Labs Lab 06/11/14 0229 06/12/14 0518  WBC 11.9* 10.6*  NEUTROABS 7.9*  --   HGB 14.2 15.1  HCT 40.8 43.7  MCV 92.3 91.8  PLT 171 173    Cardiac Enzymes:  Recent Labs Lab 06/11/14 0229 06/11/14 0748 06/11/14 1359  TROPONINI <0.30 <0.30 <0.30    Radiology: Dg Chest Portable 1 View  06/11/2014   CLINICAL DATA:  Chest pain.  EXAM: PORTABLE CHEST - 1 VIEW  COMPARISON:  Prior radiograph from 05/30/2014  FINDINGS:  Median sternotomy wires with underlying surgical clips and CABG markers again noted, unchanged. Cardiomegaly is stable. Mediastinal silhouette within normal limits.  The lungs are normally inflated. Pulmonary vascularity is mildly engorged without pulmonary edema. No focal infiltrate. No definite pleural effusion. There is no pneumothorax.  No acute osseous abnormality identified.  Diffuse osteopenia noted.  IMPRESSION: Stable cardiomegaly without pulmonary edema or other acute cardiopulmonary abnormality.   Electronically Signed   By: Rise MuBenjamin  McClintock M.D.   On: 06/11/2014 03:48     ECG: Atrial fibrillation rate 62 bpm. Possible Inferior infarct ,  Anterolateral infarct ,   Impression and Recommendations  1. CAD: Coronary bypass grafting with recent cardiac catheterization in December 2014, with severe three-vessel CAD, SVG x2 with chronically occluded of the RCA and OM, patent LIMA to LAD, There was 30% narrowing at the ostium of the LAD at the proximal edge of the stent. There were no targets for revascularization. On last office visit it was noted that he was unable to take higher doses of Imdur secondary to hypotension. Continue ACE inhibitor, aspirin, beta blocker, coreg 25 mg twice a day, and statin. Consideration for stopping beta blocker in the setting of COPD,  2. Recurrent angina: Multifactorial in the setting of continued smoking, questionable medical compliance, he is intolerant to higher doses of nitrates due to hypertension. Will start Ranexa 500 mg  BID. Creatinine 1.02. I discussed with him the need for medical compliance. I also discussed with him whether he can afford his medications. He states that he can as he does have insurance. He can go home from a cardiac standpoint, with followup in the office to assess if Ranexa is helpful to him.  3. Atrial fibrillation: Rate is currently controlled on carvedilol and digoxin. Digoxin level is low. He is also on Pradaxa 150 mg twice a  day for anticoagulation. No complaints of bleeding. He is not a candidate for calcium channel blocker with significant systolic dysfunction. There was discussion concerning ICD pacemaker implantation, but no followup appointment had been made at that time.  4. Diabetes: Really not well controlled. He was hyperglycemic on admission. Recommend stricter dietary compliance, concerns for compliance with meds are also affected.  Signed: Bettey MareKathryn M. Lawrence NP  06/12/2014, 9:25 AM Co-Sign MD

## 2014-06-12 NOTE — Progress Notes (Signed)
Subjective: The patient is alert and oriented. His had no further chest pain. He continues to have atrial fibrillation with slow ventricular rate. His blood pressure 120/75 this morning. Cardiac markers are normal.  Objective: Vital signs in last 24 hours: Temp:  [97.7 F (36.5 C)-98.6 F (37 C)] 98.6 F (37 C) (06/21 2120) Pulse Rate:  [48-72] 48 (06/22 0124) Resp:  [18] 18 (06/21 2120) BP: (113-120)/(58-75) 120/75 mmHg (06/22 0124) SpO2:  [97 %-99 %] 97 % (06/21 2120) Weight change:  Last BM Date: 06/10/14  Intake/Output from previous day: 06/21 0701 - 06/22 0700 In: 770 [P.O.:770] Out: -  Intake/Output this shift: Total I/O In: 50 [P.O.:50] Out: -   Physical Exam: General appearance-the patient is alert oriented  HEENT negative  Neck supple no JVD or thyroid abnormalities  Lungs clear to P&A  Heart irregular rhythm no murmurs no cardiomegaly  Abdomen no palpable organs or masses  Extremities free of edema   Recent Labs  06/11/14 0229  WBC 11.9*  HGB 14.2  HCT 40.8  PLT 171   BMET  Recent Labs  06/11/14 0229  NA 141  K 4.2  CL 103  CO2 23  GLUCOSE 221*  BUN 26*  CREATININE 1.46*  CALCIUM 9.2    Studies/Results: Dg Chest Portable 1 View  06/11/2014   CLINICAL DATA:  Chest pain.  EXAM: PORTABLE CHEST - 1 VIEW  COMPARISON:  Prior radiograph from 05/30/2014  FINDINGS: Median sternotomy wires with underlying surgical clips and CABG markers again noted, unchanged. Cardiomegaly is stable. Mediastinal silhouette within normal limits.  The lungs are normally inflated. Pulmonary vascularity is mildly engorged without pulmonary edema. No focal infiltrate. No definite pleural effusion. There is no pneumothorax.  No acute osseous abnormality identified.  Diffuse osteopenia noted.  IMPRESSION: Stable cardiomegaly without pulmonary edema or other acute cardiopulmonary abnormality.   Electronically Signed   By: Rise MuBenjamin  McClintock M.D.   On: 06/11/2014 03:48     Medications:  . aspirin EC  325 mg Oral Daily  . atorvastatin  80 mg Oral q1800  . carvedilol  25 mg Oral BID  . dabigatran  150 mg Oral BID  . digoxin  125 mcg Oral Daily  . insulin aspart  0-15 Units Subcutaneous TID WC  . insulin aspart  0-5 Units Subcutaneous QHS  . insulin detemir  25 Units Subcutaneous q morning - 10a  . isosorbide mononitrate  30 mg Oral Daily  . lisinopril  5 mg Oral Daily  . multivitamin with minerals  1 tablet Oral Daily  . nicotine  14 mg Transdermal Daily  . sodium chloride  3 mL Intravenous Q12H  . sodium chloride  3 mL Intravenous Q12H        Assessment/Plan: 1. Patient was admitted with chest pain to rule out MI-plan to continue current regimen we'll obtain cardiology consult today  2 history chronic systolic and diastolic congestive heart failure-stable at present time  3. A 2 fibrillation chronic anticoagulation-continue current medication    LOS: 1 day   [name],ANGUS G 06/12/2014, 6:21 AM

## 2014-06-12 NOTE — Progress Notes (Signed)
MEDICATION RELATED CONSULT NOTE - follow up  Pharmacy Consult for Pradaxa (renal adjustment) Indication: h/o afib, chronic anticoagulation  No Known Allergies  Patient Measurements: Height: 5\' 9"  (175.3 cm) Weight: 162 lb 12.7 oz (73.844 kg) IBW/kg (Calculated) : 70.7  Vital Signs: Temp: 98.1 F (36.7 C) (06/22 0655) Temp src: Oral (06/22 0655) BP: 126/81 mmHg (06/22 0655) Pulse Rate: 58 (06/22 0655) Intake/Output from previous day: 06/21 0701 - 06/22 0700 In: 770 [P.O.:770] Out: -  Intake/Output from this shift:    Labs:  Recent Labs  06/11/14 0229 06/12/14 0518  WBC 11.9* 10.6*  HGB 14.2 15.1  HCT 40.8 43.7  PLT 171 173  CREATININE 1.46* 1.02  MG  --  2.0  PHOS  --  3.7  ALBUMIN 3.6 3.6  PROT 6.9 7.0  AST 14 14  ALT 22 19  ALKPHOS 69 69  BILITOT 0.2* 0.3   Estimated Creatinine Clearance: 70.3 ml/min (by C-G formula based on Cr of 1.02).  Microbiology: No results found for this or any previous visit (from the past 720 hour(s)).  Medical History: Past Medical History  Diagnosis Date  . Coronary atherosclerosis of native coronary artery     Ant. MI in 4/95; PTCA of 90% prox & distal LAD unsuccessful-->  Urgent CABG-4/95 TO Cx; 50% RCA; ant. HK; EF of 50-55%; 08/2008: 3-V disease plus a  95% LIMA stenosis, treated with DES; occlusion of SVG to CX; RCA SVG stenosis--> PCI with DES  2010: in-stent restenosis in the LIMA-->cutting balloon; EF of 35-40%;  07/2010: TO of SVG to     RCA-medical therapy advised; EF of 30-35% by echo in 12/2010  . Atrial fibrillation 1995    1995, 2009; bradycardia with beta blocker therapy  . Tobacco abuse     40-50 pack years; currently one pack per day  . Peripheral vascular disease 1995    Abdominal aortic obstruction 1995-->vascular surgery  . Hyperlipidemia   . Type 2 diabetes mellitus   . DDD (degenerative disc disease)     Discectomy and fusion at L4 and L5  . Alcohol use 1999    Excessive alcohol use- quit in 1999  .  Chronic systolic heart failure     LVEF 25-30% 2011  . Non-compliance    Medications:  Prescriptions prior to admission  Medication Sig Dispense Refill  . aspirin EC 325 MG EC tablet Take 1 tablet (325 mg total) by mouth daily.  30 tablet  0  . carvedilol (COREG) 25 MG tablet Take 25 mg by mouth 2 (two) times daily.      . dabigatran (PRADAXA) 150 MG CAPS capsule Take 150 mg by mouth 2 (two) times daily.      . digoxin (LANOXIN) 0.125 MG tablet Take 125 mcg by mouth daily.        . furosemide (LASIX) 40 MG tablet Take 40 mg every other day.  30 tablet  3  . insulin aspart (NOVOLOG FLEXPEN) 100 UNIT/ML FlexPen Inject 15-20 Units into the skin 3 (three) times daily with meals.       . insulin detemir (LEVEMIR) 100 UNIT/ML injection Inject 25 Units into the skin every morning.       . isosorbide mononitrate (IMDUR) 30 MG 24 hr tablet Take 1 tablet (30 mg total) by mouth daily.  30 tablet  5  . lisinopril (PRINIVIL,ZESTRIL) 5 MG tablet Take 1 tablet (5 mg total) by mouth daily.  90 tablet  3  . nitroGLYCERIN (NITROSTAT) 0.4 MG  SL tablet Place 1 tablet (0.4 mg total) under the tongue every 5 (five) minutes as needed.  25 tablet  6  . potassium chloride SA (K-DUR,KLOR-CON) 20 MEQ tablet Take 1 tablet (20 mEq total) by mouth 2 (two) times daily.  60 tablet  6  . rosuvastatin (CRESTOR) 40 MG tablet Take 40 mg by mouth daily.       Assessment: 68yo male with h/o afib on Pradaxa PTA.  Pt has elevated SCr on admission but has now improved.  Estimated Creatinine Clearance: 70.3 ml/min (by C-G formula based on Cr of 1.02).       Goal of Therapy:  Anticoagulation for afib, appropriate dosing of Pradaxa  Plan:  Continue Pradaxa 150mg  PO q12hrs Monitor labs and renal fxn. Monitor for s/sx of bleeding complications  Valrie HartHall, Scott A 06/12/2014,8:09 AM

## 2014-06-13 MED ORDER — RANOLAZINE ER 500 MG PO TB12: 500.0000 mg | ORAL_TABLET | Freq: Two times a day (BID) | ORAL | Status: DC

## 2014-06-13 NOTE — Progress Notes (Addendum)
Telemetry monitor showed a pause in heart rhythm at 03:01:36 for 2.74sec, and a second pause at 03:01:40 for 2.69sec while patient was sleeping. Upon assessment, patient was stable and asymptomatic. Vitals are stable with heart rate in the 50s.Dr. Ouida SillsFagan made aware. He advised to hold 08:00am dose of Coreg 25mg  until cardiology can follow up and assess patient. Will continue to monitor. *Update 5:53am: Dr. Renard MatterMcInnis came in to see patient and gave O.K. to give Coreg 25 mg at scheduled time.

## 2014-06-13 NOTE — Discharge Summary (Signed)
Physician Discharge Summary  [name] AOZ:[id number] DOB: 01/01/1946 DOA: 06/11/2014  PCP: Alice ReichertMCINNIS,Shalamar Crays G, MD  Admit date: 06/11/2014 Discharge date: 06/13/2014     Discharge Diagnoses:  1. Chest pain atypical with hypotension 2. Systolic and diastolic congestive heart failure 3. Atrial fibrillation 4. Diabetes mellitus type 2 5. Triple-vessel coronary artery disease     Discharge Condition: Stable Disposition: Home  Diet recommendation: 2000-calorie low carbohydrate heart healthy diet  Filed Weights   06/11/14 0454 06/12/14 0655 06/13/14 0550  Weight: 74.844 kg (165 lb) 73.844 kg (162 lb 12.7 oz) 74.753 kg (164 lb 12.8 oz)    History of present illness:  The patient was admitted from home with chest pain at rest. He does have a history coronary artery disease status post CABG and diabetes mellitus on insulin hypertension H. fibrillation. The patient was given nitroglycerin by EMS with a drop in blood pressure. His pain subsided he was subsequently admitted  Hospital Course:  An electrocardiogram was done shortly after admission which revealed atrial fibrillation inverted T waves in inferior leads nonspecific ST changes V5 and V6 Serial troponins remained normal. Patient mild elevated creatinine which is thought to be at baseline. His chronic systolic and diastolic congestive heart failure was felt to be well compensated. Chest x-ray revealed stable cardiomegaly without pulmonary edema. The patient remained relatively asymptomatic throughout the hospital stay with no further chest pain. His sugars range between 198 and 342. The patient is on Levemir insulin and sliding scale NovoLog. Cardiology did see the patient and recommended that he remain on current medication as listed below with no further workup needed at present time.  No Known Allergies  The results of significant diagnostics from this hospitalization (including imaging, microbiology, ancillary and laboratory)  are listed below for reference.    Significant Diagnostic Studies: Dg Chest Portable 1 View  06/11/2014   CLINICAL DATA:  Chest pain.  EXAM: PORTABLE CHEST - 1 VIEW  COMPARISON:  Prior radiograph from 05/30/2014  FINDINGS: Median sternotomy wires with underlying surgical clips and CABG markers again noted, unchanged. Cardiomegaly is stable. Mediastinal silhouette within normal limits.  The lungs are normally inflated. Pulmonary vascularity is mildly engorged without pulmonary edema. No focal infiltrate. No definite pleural effusion. There is no pneumothorax.  No acute osseous abnormality identified.  Diffuse osteopenia noted.  IMPRESSION: Stable cardiomegaly without pulmonary edema or other acute cardiopulmonary abnormality.   Electronically Signed   By: Rise MuBenjamin  McClintock M.D.   On: 06/11/2014 03:48   Dg Chest Port 1 View  05/30/2014   CLINICAL DATA:  Chest pain.  EXAM: PORTABLE CHEST - 1 VIEW  COMPARISON:  04/05/2014  FINDINGS: There is chronic mild cardiomegaly. CABG changes again noted. Chronic interstitial coarsening without overt edema. Blunting of the lateral costophrenic sulci, not seen previously. No asymmetric opacity. No pneumothorax.  IMPRESSION: Possible trace pleural effusions.  No pulmonary edema or pneumonia.   Electronically Signed   By: Tiburcio PeaJonathan  Watts M.D.   On: 05/30/2014 20:58    Microbiology: No results found for this or any previous visit (from the past 240 hour(s)).   Labs: Basic Metabolic Panel:  Recent Labs Lab 06/11/14 0229 06/12/14 0518  NA 141 140  K 4.2 4.5  CL 103 101  CO2 23 26  GLUCOSE 221* 173*  BUN 26* 19  CREATININE 1.46* 1.02  CALCIUM 9.2 9.5  MG  --  2.0  PHOS  --  3.7   Liver Function Tests:  Recent Labs Lab 06/11/14 0229 06/12/14  0518  AST 14 14  ALT 22 19  ALKPHOS 69 69  BILITOT 0.2* 0.3  PROT 6.9 7.0  ALBUMIN 3.6 3.6   No results found for this basename: LIPASE, AMYLASE,  in the last 168 hours No results found for this basename:  AMMONIA,  in the last 168 hours CBC:  Recent Labs Lab 06/11/14 0229 06/12/14 0518  WBC 11.9* 10.6*  NEUTROABS 7.9*  --   HGB 14.2 15.1  HCT 40.8 43.7  MCV 92.3 91.8  PLT 171 173   Cardiac Enzymes:  Recent Labs Lab 06/11/14 0229 06/11/14 0748 06/11/14 1359  TROPONINI <0.30 <0.30 <0.30   BNP: BNP (last 3 results)  Recent Labs  12/10/13 1310 12/12/13 0906 06/11/14 0229  PROBNP 9452.0* 4015.0* 2933.0*   CBG:  Recent Labs Lab 06/11/14 2134 06/12/14 0739 06/12/14 1116 06/12/14 1639 06/12/14 2045  GLUCAP 229* 198* 250* 342* 320*    Principal Problem:   Chest pain, rule out acute myocardial infarction Active Problems:   DIABETES MELLITUS, TYPE II   Arteriosclerotic cardiovascular disease (ASCVD)   Atrial fibrillation   Chronic anticoagulation   Chronic combined systolic and diastolic CHF, NYHA class 3   Time coordinating discharge: 45 minutes  Signed:  Butch PennyAngus Keon Benscoter, MD 06/13/2014, 6:10 AM

## 2014-06-13 NOTE — Progress Notes (Signed)
Pt discharged home today per Dr. Renard MatterMcInnis. Pt's IV site D/C'd and WNL. Pt's VSS at discharge. Pt's discharge paperwork printed and patient instructed to call nurse when he was ready to leave.  Pt did not do so and secretary noted patient leaving the floor without assistance.  Pt asked to wait on nurse, however secretary stated patient would not do so.  Family back on floor to get patient's belongings.  Dr. Renard MatterMcInnis did give patient his prescriptions before leaving.

## 2014-08-22 ENCOUNTER — Emergency Department (HOSPITAL_COMMUNITY): Payer: Medicare Other

## 2014-08-22 ENCOUNTER — Observation Stay (HOSPITAL_COMMUNITY)
Admission: EM | Admit: 2014-08-22 | Discharge: 2014-08-25 | Disposition: A | Discharge status: Home / Self Care | Payer: Medicare Other | Attending: Family Medicine | Admitting: Family Medicine

## 2014-08-22 ENCOUNTER — Encounter (HOSPITAL_COMMUNITY): Payer: Self-pay | Admitting: Emergency Medicine

## 2014-08-22 DIAGNOSIS — Z8739 Personal history of other diseases of the musculoskeletal system and connective tissue: Secondary | ICD-10-CM | POA: Insufficient documentation

## 2014-08-22 DIAGNOSIS — I4891 Unspecified atrial fibrillation: Secondary | ICD-10-CM | POA: Insufficient documentation

## 2014-08-22 DIAGNOSIS — I482 Chronic atrial fibrillation, unspecified: Secondary | ICD-10-CM

## 2014-08-22 DIAGNOSIS — I5022 Chronic systolic (congestive) heart failure: Secondary | ICD-10-CM | POA: Diagnosis not present

## 2014-08-22 DIAGNOSIS — I2510 Atherosclerotic heart disease of native coronary artery without angina pectoris: Secondary | ICD-10-CM | POA: Diagnosis not present

## 2014-08-22 DIAGNOSIS — Z79899 Other long term (current) drug therapy: Secondary | ICD-10-CM | POA: Diagnosis not present

## 2014-08-22 DIAGNOSIS — Z794 Long term (current) use of insulin: Secondary | ICD-10-CM | POA: Insufficient documentation

## 2014-08-22 DIAGNOSIS — Z7982 Long term (current) use of aspirin: Secondary | ICD-10-CM | POA: Diagnosis not present

## 2014-08-22 DIAGNOSIS — Z951 Presence of aortocoronary bypass graft: Secondary | ICD-10-CM | POA: Insufficient documentation

## 2014-08-22 DIAGNOSIS — E785 Hyperlipidemia, unspecified: Secondary | ICD-10-CM | POA: Diagnosis not present

## 2014-08-22 DIAGNOSIS — E119 Type 2 diabetes mellitus without complications: Secondary | ICD-10-CM

## 2014-08-22 DIAGNOSIS — F17200 Nicotine dependence, unspecified, uncomplicated: Secondary | ICD-10-CM | POA: Diagnosis not present

## 2014-08-22 DIAGNOSIS — R079 Chest pain, unspecified: Secondary | ICD-10-CM | POA: Insufficient documentation

## 2014-08-22 DIAGNOSIS — R0789 Other chest pain: Principal | ICD-10-CM | POA: Insufficient documentation

## 2014-08-22 DIAGNOSIS — Z7901 Long term (current) use of anticoagulants: Secondary | ICD-10-CM

## 2014-08-22 LAB — CBC WITH DIFFERENTIAL/PLATELET
Basophils Absolute: 0.1 10*3/uL (ref 0.0–0.1)
Basophils Relative: 1 % (ref 0–1)
EOS ABS: 0.3 10*3/uL (ref 0.0–0.7)
EOS PCT: 3 % (ref 0–5)
HCT: 39.8 % (ref 39.0–52.0)
Hemoglobin: 14.1 g/dL (ref 13.0–17.0)
LYMPHS ABS: 2.1 10*3/uL (ref 0.7–4.0)
Lymphocytes Relative: 20 % (ref 12–46)
MCH: 32.4 pg (ref 26.0–34.0)
MCHC: 35.4 g/dL (ref 30.0–36.0)
MCV: 91.5 fL (ref 78.0–100.0)
Monocytes Absolute: 1 10*3/uL (ref 0.1–1.0)
Monocytes Relative: 10 % (ref 3–12)
Neutro Abs: 6.8 10*3/uL (ref 1.7–7.7)
Neutrophils Relative %: 66 % (ref 43–77)
Platelets: 181 10*3/uL (ref 150–400)
RBC: 4.35 MIL/uL (ref 4.22–5.81)
RDW: 13.8 % (ref 11.5–15.5)
WBC: 10.3 10*3/uL (ref 4.0–10.5)

## 2014-08-22 LAB — BASIC METABOLIC PANEL
Anion gap: 15 (ref 5–15)
BUN: 26 mg/dL — AB (ref 6–23)
CALCIUM: 8.7 mg/dL (ref 8.4–10.5)
CO2: 19 mEq/L (ref 19–32)
CREATININE: 1.73 mg/dL — AB (ref 0.50–1.35)
Chloride: 99 mEq/L (ref 96–112)
GFR calc Af Amer: 45 mL/min — ABNORMAL LOW (ref >90)
GFR calc non Af Amer: 39 mL/min — ABNORMAL LOW (ref >90)
GLUCOSE: 362 mg/dL — AB (ref 70–99)
Potassium: 4.4 mEq/L (ref 3.7–5.3)
Sodium: 133 mEq/L — ABNORMAL LOW (ref 137–147)

## 2014-08-22 LAB — TROPONIN I: Troponin I: <0.3 ng/mL (ref <0.30)

## 2014-08-22 LAB — PROTIME-INR
INR: 1.36 (ref 0.00–1.49)
Prothrombin Time: 16.8 seconds — ABNORMAL HIGH (ref 11.6–15.2)

## 2014-08-22 LAB — I-STAT TROPONIN, ED: TROPONIN I, POC: 0 ng/mL (ref 0.00–0.08)

## 2014-08-22 LAB — PRO B NATRIURETIC PEPTIDE: Pro B Natriuretic peptide (BNP): 1437 pg/mL — ABNORMAL HIGH (ref 0–125)

## 2014-08-22 MED ORDER — SODIUM CHLORIDE 0.9 % IV SOLN: 250.0000 mL | INTRAVENOUS | Status: DC | PRN

## 2014-08-22 MED ORDER — SODIUM CHLORIDE 0.9 % IV BOLUS (SEPSIS)
500.0000 mL | Freq: Once | INTRAVENOUS | Status: AC
  Administered 2014-08-22: 500 mL via INTRAVENOUS

## 2014-08-22 MED ORDER — CARVEDILOL 12.5 MG PO TABS
25.0000 mg | ORAL_TABLET | Freq: Two times a day (BID) | ORAL | Status: DC
Start: 2014-08-22 — End: 2014-08-25
  Administered 2014-08-23 – 2014-08-25 (×5): 25 mg via ORAL
  Filled 2014-08-22 (×5): qty 2

## 2014-08-22 MED ORDER — DIGOXIN 125 MCG PO TABS
125.0000 ug | ORAL_TABLET | Freq: Every day | ORAL | Status: DC
  Administered 2014-08-23 – 2014-08-25 (×2): 125 ug via ORAL
  Filled 2014-08-22 (×3): qty 1

## 2014-08-22 MED ORDER — RANOLAZINE ER 500 MG PO TB12
500.0000 mg | ORAL_TABLET | Freq: Two times a day (BID) | ORAL | Status: DC
  Administered 2014-08-23: 500 mg via ORAL
  Filled 2014-08-22 (×5): qty 1

## 2014-08-22 MED ORDER — FENTANYL CITRATE 0.05 MG/ML IJ SOLN
50.0000 ug | Freq: Once | INTRAMUSCULAR | Status: AC
  Administered 2014-08-22: 50 ug via INTRAVENOUS
  Filled 2014-08-22: qty 2

## 2014-08-22 MED ORDER — SODIUM CHLORIDE 0.9 % IJ SOLN
3.0000 mL | INTRAMUSCULAR | Status: DC | PRN
  Administered 2014-08-23: 3 mL via INTRAVENOUS

## 2014-08-22 MED ORDER — SODIUM CHLORIDE 0.9 % IJ SOLN
3.0000 mL | Freq: Two times a day (BID) | INTRAMUSCULAR | Status: DC
  Administered 2014-08-23 – 2014-08-25 (×3): 3 mL via INTRAVENOUS

## 2014-08-22 MED ORDER — ISOSORBIDE MONONITRATE ER 60 MG PO TB24
30.0000 mg | ORAL_TABLET | Freq: Every day | ORAL | Status: DC
  Administered 2014-08-23 – 2014-08-25 (×3): 30 mg via ORAL
  Filled 2014-08-22 (×3): qty 1

## 2014-08-22 MED ORDER — INSULIN DETEMIR 100 UNIT/ML ~~LOC~~ SOLN
30.0000 [IU] | Freq: Every morning | SUBCUTANEOUS | Status: DC
  Administered 2014-08-23 – 2014-08-25 (×3): 30 [IU] via SUBCUTANEOUS
  Filled 2014-08-22 (×6): qty 0.3

## 2014-08-22 MED ORDER — INSULIN ASPART 100 UNIT/ML ~~LOC~~ SOLN
0.0000 [IU] | Freq: Three times a day (TID) | SUBCUTANEOUS | Status: DC
  Administered 2014-08-23 (×2): 7 [IU] via SUBCUTANEOUS
  Administered 2014-08-23: 3 [IU] via SUBCUTANEOUS

## 2014-08-22 MED ORDER — ASPIRIN EC 325 MG PO TBEC
325.0000 mg | DELAYED_RELEASE_TABLET | Freq: Every day | ORAL | Status: DC
  Administered 2014-08-23: 325 mg via ORAL
  Filled 2014-08-22: qty 1

## 2014-08-22 MED ORDER — SODIUM CHLORIDE 0.9 % IJ SOLN
3.0000 mL | Freq: Two times a day (BID) | INTRAMUSCULAR | Status: DC
  Administered 2014-08-24 – 2014-08-25 (×2): 3 mL via INTRAVENOUS

## 2014-08-22 MED ORDER — DABIGATRAN ETEXILATE MESYLATE 150 MG PO CAPS
150.0000 mg | ORAL_CAPSULE | Freq: Two times a day (BID) | ORAL | Status: DC
  Administered 2014-08-23 – 2014-08-25 (×5): 150 mg via ORAL
  Filled 2014-08-22 (×12): qty 1

## 2014-08-22 MED ORDER — MORPHINE SULFATE 2 MG/ML IJ SOLN: 2.0000 mg | INTRAMUSCULAR | Status: DC | PRN

## 2014-08-22 MED ORDER — LISINOPRIL 5 MG PO TABS
5.0000 mg | ORAL_TABLET | Freq: Every day | ORAL | Status: DC
  Administered 2014-08-25: 5 mg via ORAL
  Filled 2014-08-22 (×3): qty 1

## 2014-08-22 MED ORDER — ATORVASTATIN CALCIUM 40 MG PO TABS
80.0000 mg | ORAL_TABLET | Freq: Every day | ORAL | Status: DC
  Administered 2014-08-23 – 2014-08-24 (×2): 80 mg via ORAL
  Filled 2014-08-22 (×2): qty 2

## 2014-08-22 NOTE — ED Notes (Signed)
Report given to nurse in department 300

## 2014-08-22 NOTE — ED Provider Notes (Signed)
TIME SEEN: 7:45 PM  CHIEF COMPLAINT: Chest pain  HPI: Patient is a 68 y.o. M with history of coronary artery disease, continued tobacco use, atrial fibrillation on pradaxa diabetes, hyperlipidemia, systolic heart failure with an EF of 25-35% who presents the emergency department with complaints of chest pain. He describes the chest pain is central and sharp without radiation. Started at 6:30 PM today while at rest. Denies any associated shortness of breath to me despite nursing notes. No nausea, vomiting, diaphoresis, dizziness. He is unsure if this feels like his prior anginal equivalent.  Per records, patient has a significant cardiac history. His last cardiac catheterization in 2014 showed significant three-vessel coronary artery disease but was stable compared to his catheterization in 2011. At that time there was no target vessels for revascularization. He was started on Ranexa as he is unable to tolerate nitrates due to hypotension when he was here in the hospital in June 2015. Patient is unable to calm me if he has been taking his Ranexa as prescribed.  ROS: See HPI Constitutional: no fever  Eyes: no drainage  ENT: no runny nose   Cardiovascular:   chest pain  Resp: no SOB  GI: no vomiting GU: no dysuria Integumentary: no rash  Allergy: no hives  Musculoskeletal: no leg swelling  Neurological: no slurred speech ROS otherwise negative  PAST MEDICAL HISTORY/PAST SURGICAL HISTORY:  Past Medical History  Diagnosis Date  . Coronary atherosclerosis of native coronary artery     Ant. MI in 4/95; PTCA of 90% prox & distal LAD unsuccessful-->  Urgent CABG-4/95 TO Cx; 50% RCA; ant. HK; EF of 50-55%; 08/2008: 3-V disease plus a  95% LIMA stenosis, treated with DES; occlusion of SVG to CX; RCA SVG stenosis--> PCI with DES  2010: in-stent restenosis in the LIMA-->cutting balloon; EF of 35-40%;  07/2010: TO of SVG to     RCA-medical therapy advised; EF of 30-35% by echo in 12/2010  . Atrial  fibrillation 1995    1995, 2009; bradycardia with beta blocker therapy  . Tobacco abuse     40-50 pack years; currently one pack per day  . Peripheral vascular disease 1995    Abdominal aortic obstruction 1995-->vascular surgery  . Hyperlipidemia   . Type 2 diabetes mellitus   . DDD (degenerative disc disease)     Discectomy and fusion at L4 and L5  . Alcohol use 1999    Excessive alcohol use- quit in 1999  . Chronic systolic heart failure     LVEF 25-30% 2011  . Non-compliance     MEDICATIONS:  Prior to Admission medications   Medication Sig Start Date End Date Taking? Authorizing Provider  aspirin EC 325 MG EC tablet Take 1 tablet (325 mg total) by mouth daily. 05/06/14   Angus Edilia Bo, MD  carvedilol (COREG) 25 MG tablet Take 25 mg by mouth 2 (two) times daily.    Historical Provider, MD  dabigatran (PRADAXA) 150 MG CAPS capsule Take 150 mg by mouth 2 (two) times daily.    Historical Provider, MD  digoxin (LANOXIN) 0.125 MG tablet Take 125 mcg by mouth daily.      Historical Provider, MD  furosemide (LASIX) 40 MG tablet Take 40 mg by mouth 2 (two) times daily. Take 40 mg every other day. 05/06/14   Angus Edilia Bo, MD  HYDROcodone-acetaminophen (NORCO) 10-325 MG per tablet Take 1 tablet by mouth every 4 (four) hours as needed for moderate pain.    Historical Provider, MD  insulin aspart (NOVOLOG FLEXPEN) 100 UNIT/ML FlexPen Inject 0-8 Units into the skin 4 (four) times daily. Per sliding scale    Historical Provider, MD  insulin detemir (LEVEMIR) 100 UNIT/ML injection Inject 30 Units into the skin every morning.     Historical Provider, MD  isosorbide mononitrate (IMDUR) 30 MG 24 hr tablet Take 1 tablet (30 mg total) by mouth daily. 09/08/13   Angus Edilia Bo, MD  lisinopril (PRINIVIL,ZESTRIL) 5 MG tablet Take 1 tablet (5 mg total) by mouth daily. 02/28/14   Antoine Poche, MD  nitroGLYCERIN (NITROSTAT) 0.4 MG SL tablet Place 1 tablet (0.4 mg total) under the tongue every 5 (five)  minutes as needed. 05/24/12   Kathlen Brunswick, MD  potassium chloride SA (K-DUR,KLOR-CON) 20 MEQ tablet Take 1 tablet (20 mEq total) by mouth 2 (two) times daily. 12/27/13   Jodelle Gross, NP  ranolazine (RANEXA) 500 MG 12 hr tablet Take 1 tablet (500 mg total) by mouth 2 (two) times daily. 06/13/14   Angus Edilia Bo, MD  rosuvastatin (CRESTOR) 40 MG tablet Take 40 mg by mouth daily.    Historical Provider, MD    ALLERGIES:  No Known Allergies  SOCIAL HISTORY:  History  Substance Use Topics  . Smoking status: Current Every Day Smoker -- 1.00 packs/day for 40 years    Types: Cigarettes  . Smokeless tobacco: Current User     Comment: refused  . Alcohol Use: No     Comment: former    FAMILY HISTORY: Family History  Problem Relation Age of Onset  . CAD Father     EXAM: BP 106/54  Pulse 81  Temp(Src) 97.9 F (36.6 C) (Oral)  Resp 16  Ht  (1.753 m)  Wt 170 lb (77.111 kg)  BMI 25.09 kg/m2  SpO2 100% CONSTITUTIONAL: Alert and oriented and responds appropriately to questions. Well-appearing; well-nourished HEAD: Normocephalic EYES: Conjunctivae clear, PERRL ENT: normal nose; no rhinorrhea; moist mucous membranes; pharynx without lesions noted NECK: Supple, no meningismus, no LAD  CARD: RRR; S1 and S2 appreciated; no murmurs, no clicks, no rubs, no gallops RESP: Normal chest excursion without splinting or tachypnea; breath sounds clear and equal bilaterally; no wheezes, no rhonchi, no rales, no respiratory distress or hypoxia ABD/GI: Normal bowel sounds; non-distended; soft, non-tender, no rebound, no guarding BACK:  The back appears normal and is non-tender to palpation, there is no CVA tenderness EXT: Normal ROM in all joints; non-tender to palpation; no edema; normal capillary refill; no cyanosis    SKIN: Normal color for age and race; warm NEURO: Moves all extremities equally, no facial droop or slurred speech PSYCH: The patient's mood and manner are appropriate.  Grooming and personal hygiene are appropriate. Appears anxious.  MEDICAL DECISION MAKING: Patient here with chest pain that may be atypical but he does have very significant cardiac history. We'll obtain cardiac labs and chest x-ray. He is mildly hypertensive. We'll give fentanyl for pain and avoid nitrates. We'll give a small IV fluid bolus.  ED PROGRESS: Patient's pain has improved with fentanyl and he is almost completely pain-free. He does have mild elevation of his creatinine of 1.73 which is new for him. This may also explain some of his hypotension. His blood pressure is improved to the 120s/70s with IV fluids. Troponin today is negative. Chest x-ray is clear. Discussed with Dr. Ace Gins with cardiology who recommended admission. He feels the patient can likely be admitted here since his blood pressure is improved and he is  chest pain-free. Discussed with Dr. Sharl Ma with hospitalist service who agrees with plan and will admit patient to telemetry, observation. Patient also comfortable with this plan.    EKG Interpretation  Date/Time:  Tuesday August 22 2014 19:27:38 EDT Ventricular Rate:  71 PR Interval:    QRS Duration: 83 QT Interval:  335 QTC Calculation: 364 R Axis:   69 Text Interpretation:  Atrial fibrillation Ventricular premature complex Low voltage, extremity and precordial leads Anteroseptal infarct, old Nonspecific repol abnormality, diffuse leads No significant change since last tracing Confirmed by Jode Lippe,  DO, Catherene Kaleta [phone]) on 08/22/2014 8:17:23 PM        Layla Maw Panagiotis Oelkers, DO 08/22/14 2133

## 2014-08-22 NOTE — ED Notes (Signed)
Patient complaining of chest pain and shortness of breath. Started today while I was outside, sitting.

## 2014-08-22 NOTE — H&P (Signed)
PCP:   Alice Reichert, MD   Chief Complaint:  Chest pain  HPI: 68 year old male who   has a past medical history of Coronary atherosclerosis of native coronary artery; Atrial fibrillation (1995); Tobacco abuse; Peripheral vascular disease (1995); Hyperlipidemia; Type 2 diabetes mellitus; DDD (degenerative disc disease); Alcohol use (1999); Chronic systolic heart failure; and Non-compliance. Today presents to the ED with chief complaint of chest pain which started today while patient was resting at home. The pain is substernal in the region, does not radiate was 8/10 in intensity, was associated with shortness of breath but no nausea vomiting. When patient came to the ED he was given fentanyl and pain is not on to 4/10 in intensity. Patient was not given nitroglycerin due to hypotension. Patient has a significant history of CAD status post CABG. Patient's last echo in May 2015 showed mild to moderate LVH systolic function was moderately to severely reduced EF 35-40%. Patient also had last cardiac catheterization in 2014 which showed significant three-vessel coronary artery disease which was stable compared to his catheterization in 2011. At that time there was no target vessels for revascularization. Patient was started on Ranexa for medical management only.   Allergies:  No Known Allergies    Past Medical History  Diagnosis Date  . Coronary atherosclerosis of native coronary artery     Ant. MI in 4/95; PTCA of 90% prox & distal LAD unsuccessful-->  Urgent CABG-4/95 TO Cx; 50% RCA; ant. HK; EF of 50-55%; 08/2008: 3-V disease plus a  95% LIMA stenosis, treated with DES; occlusion of SVG to CX; RCA SVG stenosis--> PCI with DES  2010: in-stent restenosis in the LIMA-->cutting balloon; EF of 35-40%;  07/2010: TO of SVG to     RCA-medical therapy advised; EF of 30-35% by echo in 12/2010  . Atrial fibrillation 1995    1995, 2009; bradycardia with beta blocker therapy  . Tobacco abuse     40-50 pack  years; currently one pack per day  . Peripheral vascular disease 1995    Abdominal aortic obstruction 1995-->vascular surgery  . Hyperlipidemia   . Type 2 diabetes mellitus   . DDD (degenerative disc disease)     Discectomy and fusion at L4 and L5  . Alcohol use 1999    Excessive alcohol use- quit in 1999  . Chronic systolic heart failure     LVEF 25-30% 2011  . Non-compliance     Past Surgical History  Procedure Laterality Date  . Cholecystectomy  1995  . Lumbar fusion      +Discectomy;x2; L4 and L5  . Aorto-femoral bypass graft  1995    Prior to Admission medications   Medication Sig Start Date End Date Taking? Authorizing Provider  aspirin EC 325 MG EC tablet Take 1 tablet (325 mg total) by mouth daily. 05/06/14  Yes Angus Edilia Bo, MD  carvedilol (COREG) 25 MG tablet Take 25 mg by mouth 2 (two) times daily.   Yes Historical Provider, MD  dabigatran (PRADAXA) 150 MG CAPS capsule Take 150 mg by mouth 2 (two) times daily.   Yes Historical Provider, MD  digoxin (LANOXIN) 0.125 MG tablet Take 125 mcg by mouth daily.     Yes Historical Provider, MD  furosemide (LASIX) 40 MG tablet Take 40 mg by mouth every other day.  05/06/14  Yes Angus Edilia Bo, MD  HYDROcodone-acetaminophen (NORCO) 10-325 MG per tablet Take 1 tablet by mouth every 4 (four) hours as needed for moderate pain.   Yes  Historical Provider, MD  insulin aspart (NOVOLOG FLEXPEN) 100 UNIT/ML FlexPen Inject 0-8 Units into the skin 4 (four) times daily. Per sliding scale   Yes Historical Provider, MD  insulin detemir (LEVEMIR) 100 UNIT/ML injection Inject 30 Units into the skin every morning.    Yes Historical Provider, MD  isosorbide mononitrate (IMDUR) 30 MG 24 hr tablet Take 1 tablet (30 mg total) by mouth daily. 09/08/13  Yes Angus Edilia Bo, MD  lisinopril (PRINIVIL,ZESTRIL) 5 MG tablet Take 1 tablet (5 mg total) by mouth daily. 02/28/14  Yes Antoine Poche, MD  nitroGLYCERIN (NITROSTAT) 0.4 MG SL tablet Place 1 tablet  (0.4 mg total) under the tongue every 5 (five) minutes as needed. 05/24/12  Yes Kathlen Brunswick, MD  potassium chloride SA (K-DUR,KLOR-CON) 20 MEQ tablet Take 1 tablet (20 mEq total) by mouth 2 (two) times daily. 12/27/13  Yes Jodelle Gross, NP  ranolazine (RANEXA) 500 MG 12 hr tablet Take 1 tablet (500 mg total) by mouth 2 (two) times daily. 06/13/14  Yes Angus Edilia Bo, MD  rosuvastatin (CRESTOR) 40 MG tablet Take 40 mg by mouth daily.   Yes Historical Provider, MD    Social History:  reports that he has been smoking Cigarettes.  He has a 40 pack-year smoking history. He uses smokeless tobacco. He reports that he does not drink alcohol or use illicit drugs.  Family History  Problem Relation Age of Onset  . CAD Father      All the positives are listed in BOLD  Review of Systems:  HEENT: Headache, blurred vision, runny nose, sore throat Neck: Hypothyroidism, hyperthyroidism,,lymphadenopathy Chest : Shortness of breath, history of COPD, Asthma Heart : Chest pain, history of coronary arterey disease GI:  Nausea, vomiting, diarrhea, constipation, GERD GU: Dysuria, urgency, frequency of urination, hematuria Neuro: Stroke, seizures, syncope Psych: Depression, anxiety, hallucinations   Physical Exam: Blood pressure 125/69, pulse 75, temperature 97.9 F (36.6 C), temperature source Oral, resp. rate 24, height  (1.753 m), weight 77.111 kg (170 lb), SpO2 98.00%. Constitutional:   Patient is a well-developed and well-nourished male* in no acute distress and cooperative with exam. Head: Normocephalic and atraumatic Mouth: Mucus membranes moist Eyes: PERRL, EOMI, conjunctivae normal Neck: Supple, No Thyromegaly Cardiovascular: RRR, S1 normal, S2 normal Pulmonary/Chest: CTAB, no wheezes, rales, or rhonchi Abdominal: Soft. Non-tender, non-distended, bowel sounds are normal, no masses, organomegaly, or guarding present.  Neurological: A&O x3, Strenght is normal and symmetric  bilaterally, cranial nerve II-XII are grossly intact, no focal motor deficit, sensory intact to light touch bilaterally.  Extremities : No Cyanosis, Clubbing or Edema  Labs on Admission:  Basic Metabolic Panel:  Recent Labs Lab 08/22/14 1937  NA 133*  K 4.4  CL 99  CO2 19  GLUCOSE 362*  BUN 26*  CREATININE 1.73*  CALCIUM 8.7   Liver Function Tests: No results found for this basename: AST, ALT, ALKPHOS, BILITOT, PROT, ALBUMIN,  in the last 168 hours No results found for this basename: LIPASE, AMYLASE,  in the last 168 hours No results found for this basename: AMMONIA,  in the last 168 hours CBC:  Recent Labs Lab 08/22/14 1937  WBC 10.3  NEUTROABS 6.8  HGB 14.1  HCT 39.8  MCV 91.5  PLT 181   Cardiac Enzymes:  Recent Labs Lab 08/22/14 1937  TROPONINI <0.30    BNP (last 3 results)  Recent Labs  12/12/13 0906 06/11/14 0229 08/22/14 1937  PROBNP 4015.0* 2933.0* 1437.0*   CBG: No  results found for this basename: GLUCAP,  in the last 168 hours  Radiological Exams on Admission: Dg Chest 2 View  08/22/2014   CLINICAL DATA:  CP  EXAM: CHEST - 2 VIEW  COMPARISON:  06/11/2014  FINDINGS: Previous CABG. No acute bone abnormality. Lungs clear. Heart size normal. No effusion.  IMPRESSION: 1. No acute disease post CABG.   Electronically Signed   By: Oley Balm M.D.   On: 08/22/2014 20:28    EKG: Independently reviewed. Atrial fibrillation   Assessment/Plan Principal Problem:   Chest pain Active Problems:   DIABETES MELLITUS, TYPE II   Hyperlipidemia   Atrial fibrillation   Chronic anticoagulation  Chest pain Patient has significant cardiac history, will admit the patient under telemetry and obtain serial cardiac enzymes to rule out acute coronary syndrome. First set of enzymes is negative, continue aspirin. We'll start morphine 2 mg IV every 4 hours when necessary for pain.  Diabetes mellitus Will start the patient on sliding-scale insulin with  NovoLog Continue Levemir  30 units in the morning  Coronary artery disease Continue Coreg, digoxin, Imdur ,Ranexa  Atrial fibrillation Heart rate is well controlled, continue chronic anticoagulation with Pradaxa.    Code status: Patient is full code  Family discussion: No family at bedside   Time Spent on Admission: 55 min  [facility] Pager: [phone] 08/22/2014, 9:56 PM  If 7PM-7AM, please contact night-coverage  [email or web address]  Password [email or web address]

## 2014-08-23 ENCOUNTER — Other Ambulatory Visit: Payer: Self-pay | Admitting: Adult Health

## 2014-08-23 DIAGNOSIS — I2510 Atherosclerotic heart disease of native coronary artery without angina pectoris: Secondary | ICD-10-CM

## 2014-08-23 DIAGNOSIS — I5022 Chronic systolic (congestive) heart failure: Secondary | ICD-10-CM

## 2014-08-23 DIAGNOSIS — R0789 Other chest pain: Secondary | ICD-10-CM | POA: Diagnosis not present

## 2014-08-23 LAB — CBC
HCT: 40.1 % (ref 39.0–52.0)
Hemoglobin: 13.7 g/dL (ref 13.0–17.0)
MCH: 31.5 pg (ref 26.0–34.0)
MCHC: 34.2 g/dL (ref 30.0–36.0)
MCV: 92.2 fL (ref 78.0–100.0)
PLATELETS: 162 10*3/uL (ref 150–400)
RBC: 4.35 MIL/uL (ref 4.22–5.81)
RDW: 13.9 % (ref 11.5–15.5)
WBC: 10 10*3/uL (ref 4.0–10.5)

## 2014-08-23 LAB — GLUCOSE, CAPILLARY
GLUCOSE-CAPILLARY: 320 mg/dL — AB (ref 70–99)
GLUCOSE-CAPILLARY: 370 mg/dL — AB (ref 70–99)
Glucose-Capillary: 229 mg/dL — ABNORMAL HIGH (ref 70–99)
Glucose-Capillary: 304 mg/dL — ABNORMAL HIGH (ref 70–99)

## 2014-08-23 LAB — COMPREHENSIVE METABOLIC PANEL
ALT: 16 U/L (ref 0–53)
AST: 13 U/L (ref 0–37)
Albumin: 3.6 g/dL (ref 3.5–5.2)
Alkaline Phosphatase: 71 U/L (ref 39–117)
Anion gap: 13 (ref 5–15)
BUN: 26 mg/dL — ABNORMAL HIGH (ref 6–23)
CALCIUM: 9 mg/dL (ref 8.4–10.5)
CO2: 24 mEq/L (ref 19–32)
CREATININE: 1.47 mg/dL — AB (ref 0.50–1.35)
Chloride: 100 mEq/L (ref 96–112)
GFR calc Af Amer: 55 mL/min — ABNORMAL LOW (ref >90)
GFR calc non Af Amer: 48 mL/min — ABNORMAL LOW (ref >90)
Glucose, Bld: 374 mg/dL — ABNORMAL HIGH (ref 70–99)
Potassium: 4.7 mEq/L (ref 3.7–5.3)
SODIUM: 137 meq/L (ref 137–147)
TOTAL PROTEIN: 7.1 g/dL (ref 6.0–8.3)
Total Bilirubin: 0.3 mg/dL (ref 0.3–1.2)

## 2014-08-23 LAB — TROPONIN I: Troponin I: <0.3 ng/mL (ref <0.30)

## 2014-08-23 MED ORDER — ASPIRIN EC 81 MG PO TBEC
81.0000 mg | DELAYED_RELEASE_TABLET | Freq: Every day | ORAL | Status: DC
  Administered 2014-08-24 – 2014-08-25 (×2): 81 mg via ORAL
  Filled 2014-08-23 (×2): qty 1

## 2014-08-23 MED ORDER — INSULIN ASPART 100 UNIT/ML ~~LOC~~ SOLN
0.0000 [IU] | Freq: Three times a day (TID) | SUBCUTANEOUS | Status: DC
  Administered 2014-08-23: 9 [IU] via SUBCUTANEOUS
  Administered 2014-08-24: 5 [IU] via SUBCUTANEOUS
  Administered 2014-08-24 (×2): 3 [IU] via SUBCUTANEOUS
  Administered 2014-08-24: 9 [IU] via SUBCUTANEOUS
  Administered 2014-08-25: 2 [IU] via SUBCUTANEOUS

## 2014-08-23 MED ORDER — RANOLAZINE ER 500 MG PO TB12
1000.0000 mg | ORAL_TABLET | Freq: Two times a day (BID) | ORAL | Status: DC
  Administered 2014-08-23 – 2014-08-25 (×4): 1000 mg via ORAL
  Filled 2014-08-23 (×6): qty 2

## 2014-08-23 NOTE — Progress Notes (Signed)
Patient CBG is 370, there is no bedtime coverage. Paging on-call MD will follow any new orders given.

## 2014-08-23 NOTE — Progress Notes (Signed)
Subjective: The patient appears to be more comfortable with no chest pain at present. He was admitted with substernal chest pain. He does have a prior history of three-vessel coronary artery disease a 2 fibrillation diabetes and systolic heart failure with noncompliance. His chest x-ray was negative and troponin negative on admission  Objective: Vital signs in last 24 hours: Temp:  [97.6 F (36.4 C)-98.7 F (37.1 C)] 98.7 F (37.1 C) (09/02 0440) Pulse Rate:  [43-82] 77 (09/02 0440) Resp:  [16-24] 18 (09/02 0440) BP: (89-125)/(34-70) 115/57 mmHg (09/02 0440) SpO2:  [97 %-100 %] 99 % (09/02 0440) Weight:  [75.9 kg (167 lb 5.3 oz)-77.111 kg (170 lb)] 75.9 kg (167 lb 5.3 oz) (09/01 2235) Weight change:  Last BM Date: 08/22/14  Intake/Output from previous day:   Intake/Output this shift:    Physical Exam: General appearance patient appears alert and oriented  HEENT negative  Neck supple no JVD or thyroid abnormalities  Heart regular rhythm no murmurs  Lungs clear to P&A  Abdomen no palpable organs or masses no organomegaly  Extremities free of edema   Recent Labs  08/22/14 1937 08/23/14 0411  WBC 10.3 10.0  HGB 14.1 13.7  HCT 39.8 40.1  PLT 181 162   BMET  Recent Labs  08/22/14 1937 08/23/14 0411  NA 133* 137  K 4.4 4.7  CL 99 100  CO2 19 24  GLUCOSE 362* 374*  BUN 26* 26*  CREATININE 1.73* 1.47*  CALCIUM 8.7 9.0    Studies/Results: Dg Chest 2 View  08/22/2014   CLINICAL DATA:  CP  EXAM: CHEST - 2 VIEW  COMPARISON:  06/11/2014  FINDINGS: Previous CABG. No acute bone abnormality. Lungs clear. Heart size normal. No effusion.  IMPRESSION: 1. No acute disease post CABG.   Electronically Signed   By: Oley Balm M.D.   On: 08/22/2014 20:28    Medications:  . aspirin EC  325 mg Oral Daily  . atorvastatin  80 mg Oral q1800  . carvedilol  25 mg Oral BID WC  . dabigatran  150 mg Oral BID  . digoxin  125 mcg Oral Daily  . insulin aspart  0-9 Units  Subcutaneous TID WC  . insulin detemir  30 Units Subcutaneous q morning - 10a  . isosorbide mononitrate  30 mg Oral Daily  . lisinopril  5 mg Oral Daily  . ranolazine  500 mg Oral BID  . sodium chloride  3 mL Intravenous Q12H  . sodium chloride  3 mL Intravenous Q12H        Assessment/Plan: 1 patient admitted with chest pain-rule out acute coronary syndrome we'll continue aspirin morphine as needed obtain cardiology consult and  2. Diabetes mellitus type 2 plan to continue diabetic diet sliding scale NovoLog and basal insulin. Will follow blood sugars  Coronary artery disease-continue current medications  LOS: 1 day   Kriss Ishler G 08/23/2014, 6:50 AM

## 2014-08-23 NOTE — Consult Note (Signed)
Primary cardiologist: Dr Dina Rich MD Consulting cardiologist: Dr Dina Rich MD  Clinical Summary Mr. Bojarski is a 68 y.o.male hx of ICM LVEF 35-40% by echo 04/2014, CAD with prior CABG in 1995 with subsequent stenting in 2009 and 2011, afib, orthostatic syncope due to hypovolemia, admitted with chest pain. He has a long history of intermittent chest pain with stable coronaries by cath since 2011. Last cath Dec 2014 with severe 3 vessel CAD, SVG-RCA and SVG-OM chronically occluded, patent LIMA-LAD. Anatomy stable since 2011, no targets for revasc. Admit 05/2014 with atypical chest pain, started on ranexa at discharge. Antianginal therapy has been somewhat limited due to hypotension.   Presents today with episode of chest pain yesterday at rest. 7/10 pain in midchest, no other associated symptoms. Did not try NG. Lasted approx 15 minutes and then resolved. Similar to prior chest pain.   Trop neg x3, K 4.7, Cr 1.47 (1.73 on admit), GFR 48, Hgb 13.7, Plt 162, BNP 1437 CXR no acute process afib and no specifici ischemic changes.   No Known Allergies  Medications Scheduled Medications: . aspirin EC  325 mg Oral Daily  . atorvastatin  80 mg Oral q1800  . carvedilol  25 mg Oral BID WC  . dabigatran  150 mg Oral BID  . digoxin  125 mcg Oral Daily  . insulin aspart  0-9 Units Subcutaneous TID WC  . insulin detemir  30 Units Subcutaneous q morning - 10a  . isosorbide mononitrate  30 mg Oral Daily  . lisinopril  5 mg Oral Daily  . ranolazine  500 mg Oral BID  . sodium chloride  3 mL Intravenous Q12H  . sodium chloride  3 mL Intravenous Q12H     Infusions:     PRN Medications:  sodium chloride, morphine injection, sodium chloride   Past Medical History  Diagnosis Date  . Coronary atherosclerosis of native coronary artery     Ant. MI in 4/95; PTCA of 90% prox & distal LAD unsuccessful-->  Urgent CABG-4/95 TO Cx; 50% RCA; ant. HK; EF of 50-55%; 08/2008: 3-V disease plus a   95% LIMA stenosis, treated with DES; occlusion of SVG to CX; RCA SVG stenosis--> PCI with DES  2010: in-stent restenosis in the LIMA-->cutting balloon; EF of 35-40%;  07/2010: TO of SVG to     RCA-medical therapy advised; EF of 30-35% by echo in 12/2010  . Atrial fibrillation 1995    1995, 2009; bradycardia with beta blocker therapy  . Tobacco abuse     40-50 pack years; currently one pack per day  . Peripheral vascular disease 1995    Abdominal aortic obstruction 1995-->vascular surgery  . Hyperlipidemia   . Type 2 diabetes mellitus   . DDD (degenerative disc disease)     Discectomy and fusion at L4 and L5  . Alcohol use 1999    Excessive alcohol use- quit in 1999  . Chronic systolic heart failure     LVEF 25-30% 2011  . Non-compliance     Past Surgical History  Procedure Laterality Date  . Cholecystectomy  1995  . Lumbar fusion      +Discectomy;x2; L4 and L5  . Aorto-femoral bypass graft  1995    Family History  Problem Relation Age of Onset  . CAD Father     Social History Mr. Hartig reports that he has been smoking Cigarettes.  He has a 40 pack-year smoking history. He uses smokeless tobacco. Mr. Cen reports that he does not  drink alcohol.  Review of Systems CONSTITUTIONAL: No weight loss, fever, chills, weakness or fatigue.  HEENT: Eyes: No visual loss, blurred vision, double vision or yellow sclerae. No hearing loss, sneezing, congestion, runny nose or sore throat.  SKIN: No rash or itching.  CARDIOVASCULAR: per HPI RESPIRATORY: No shortness of breath, cough or sputum.  GASTROINTESTINAL: No anorexia, nausea, vomiting or diarrhea. No abdominal pain or blood.  GENITOURINARY: no polyuria, no dysuria NEUROLOGICAL: No headache, dizziness, syncope, paralysis, ataxia, numbness or tingling in the extremities. No change in bowel or bladder control.  MUSCULOSKELETAL: No muscle, back pain, joint pain or stiffness.  HEMATOLOGIC: No anemia, bleeding or bruising.  LYMPHATICS:  No enlarged nodes. No history of splenectomy.  PSYCHIATRIC: No history of depression or anxiety.      Physical Examination Blood pressure 112/48, pulse 62, temperature 98.7 F (37.1 C), temperature source Oral, resp. rate 18, height  (1.753 m), weight 167 lb 5.3 oz (75.9 kg), SpO2 99.00%.  Intake/Output Summary (Last 24 hours) at 08/23/14 1028 Last data filed at 08/23/14 0911  Gross per 24 hour  Intake      3 ml  Output      0 ml  Net      3 ml    HEENT: sclera clear  Cardiovascular: RRR, no m/r/g, no JVD, no carotid bruits  Respiratory: CTAB  GI: abdomen soft, NT, NAD  MSK: no LE edema  Neuro: no focal deficits  Psych: appropriate affect   Lab Results  Basic Metabolic Panel:  Recent Labs Lab 08/22/14 1937 08/23/14 0411  NA 133* 137  K 4.4 4.7  CL 99 100  CO2 19 24  GLUCOSE 362* 374*  BUN 26* 26*  CREATININE 1.73* 1.47*  CALCIUM 8.7 9.0    Liver Function Tests:  Recent Labs Lab 08/23/14 0411  AST 13  ALT 16  ALKPHOS 71  BILITOT 0.3  PROT 7.1  ALBUMIN 3.6    CBC:  Recent Labs Lab 08/22/14 1937 08/23/14 0411  WBC 10.3 10.0  NEUTROABS 6.8  --   HGB 14.1 13.7  HCT 39.8 40.1  MCV 91.5 92.2  PLT 181 162    Cardiac Enzymes:  Recent Labs Lab 08/22/14 1937 08/22/14 2304 08/23/14 0411  TROPONINI <0.30 <0.30 <0.30    BNP: No components found with this basename: POCBNP,       Imaging 04/24/14 Echo  - Study data: Technically difficult study. - Left ventricle: The cavity size was normal. Wall thickness was increased increased in a pattern of mild to moderate LVH. Systolic function was moderately to severely reduced. The estimated ejection fraction was in the range of 35% to 40%. Compared to prior study 09/07/13 LVEF has improved. - Aortic valve: Mildly calcified annulus. Trileaflet; mildly thickened leaflets. Valve area: 1.65cm^2(VTI). Valve area: 1.76cm^2 (Vmax). - Mitral valve: Mildly calcified annulus. Mildly  thickened leaflets . Mild regurgitation. - Left atrium: The atrium was severely dilated. - Right ventricle: The cavity size was mildly dilated. - Right atrium: The atrium was severely dilated.  08/2013 Lexiscan  IMPRESSION: High risk study for major cardiac events due to low ejection fraction and multiple perfusion defects. There is a large fixed anteroseptal scar. There is a moderate sized defect in the inferolateral wall with minimal myocardium at jeopardy. There were no diagnostic ST-segment abnormalities. No chest pain reported. LVEF 29%  11/2013 Cath  Findings:  Ao Pressure: 106/69 (83)  LV Pressure: 111/21/25  There was no signficant gradient across the aortic valve on pullback.  Left main: 50-60% ostial  Left anterior descending artery: The left anterior descending artery was completely occluded after a small diagonal Latrisha Coiro. There was a 90% lesion in the ostium of the diagonal Daud Cayer  Circumflex artery: There was 70% proximal lesion. The circumflex artery was then completely occluded after a moderate sized marginal Nnaemeka Samson. There were L to right collaterals to the distal RCA  Right coronary artery: The right coronary artery was completely occluded at its midportion. There were collaterals to the distal circumflex artery froma right ventricular Duvid Smalls.  The LIMA graft to the LAD was patent. There was 30% narrowing at the ostium of the LAD at the proximal edge of the stent. The LAD itself was diffusely diseased with 70-80% diffuse disease distally. The LAD filled  the posterior descending Kazzandra Desaulniers by collaterals.  The saphenous vein graft to the right coronary artery was completely occluded at its origin at the stent site.  The saphenous vein graft to the circumflex system was completely occluded by prior study.  Left ventriculogram: The left ventriculogram performed in the RAO projection showed a dilated LV with EF 20-25% hypokinesis of the basilar to mid anterior wall and akinesis.  Akinesis of the mid to distal anterior wall and entire apex. The basal to mid inferior wall was severely hypokinetic  Assessment:  1. Severe 3-VAD CAD  2. SVG x 2 (RCA and OM) chronically occluded  3. Patent LIMA to LAD  4. Severe LV dysfunction EF 20-25% with elevated LVEDP  Plan/Discussion:  Coronary anatomy is stable since prior cath in 2011. No clear culprit for NSTEMI or target for revascularization. Elevated filling pressures. Can go home later today or tomorrow if pain under control.      Impression/Recommendations 1. CAD - no evidence of ACS at this time, unclear if symptoms are truly cardiac - long history of recurrent chest pain, stable coronaries by last cath in 2014 - he has significant CAD that is not amenable to revascularization, managed medically - antianginal therapy has been somewhat limited due to low blood pressures - will increase ranexa to 1000 mg bid  - if symptoms remain controlled this AM and final troponin negative can consider discharge  2. Chronic systolic heart failure - appears euvolemic, continue current meds        Dina Rich, M.D., F.A.C.C.

## 2014-08-23 NOTE — Care Management Note (Addendum)
    Page 1 of 1   08/24/2014     9:14:15 AM CARE MANAGEMENT NOTE 08/24/2014  Patient:  [name], [name]   Account Number:  [id number]  Date Initiated:  08/23/2014  Documentation initiated by:  Sharrie Rothman  Subjective/Objective Assessment:   Pt admitted from home with chest pain. Pt lives with his wife and will return home at discharge. Pt is independent with ADL's.     Action/Plan:   Pt refuses any home health involvement at this time. No other CM needs noted.   Anticipated DC Date:  08/24/2014   Anticipated DC Plan:  HOME/SELF CARE      DC Planning Services  CM consult      Choice offered to / List presented to:             Status of service:  Completed, signed off Medicare Important Message given?   (If response is "NO", the following Medicare IM given date fields will be blank) Date Medicare IM given:   Medicare IM given by:   Date Additional Medicare IM given:   Additional Medicare IM given by:    Discharge Disposition:  HOME/SELF CARE  Per UR Regulation:    If discussed at Long Length of Stay Meetings, dates discussed:    Comments:  08/24/14 0915 Arlyss Queen, RN BSN CM Pt discharged home today. No CM needs noted.  08/23/14 1050 Arlyss Queen, RN BSN CM

## 2014-08-23 NOTE — Progress Notes (Signed)
UR Completed.  [name] [name] [phone] 08/23/2014  

## 2014-08-24 DIAGNOSIS — R0789 Other chest pain: Secondary | ICD-10-CM | POA: Diagnosis not present

## 2014-08-24 LAB — GLUCOSE, CAPILLARY
GLUCOSE-CAPILLARY: 231 mg/dL — AB (ref 70–99)
GLUCOSE-CAPILLARY: 298 mg/dL — AB (ref 70–99)
GLUCOSE-CAPILLARY: 362 mg/dL — AB (ref 70–99)
Glucose-Capillary: 232 mg/dL — ABNORMAL HIGH (ref 70–99)

## 2014-08-24 NOTE — Progress Notes (Signed)
Patient ID: [name], male   DOB: 16-Dec-1946, 68 y.o.   MRN: [id number]     Subjective:    No chest pain  Objective:   Temp:  [97.2 F (36.2 C)-97.4 F (36.3 C)] 97.2 F (36.2 C) (09/03 0609) Pulse Rate:  [51-75] 56 (09/03 0808) Resp:  [18-20] 20 (09/03 0609) BP: (105-112)/(45-59) 108/50 mmHg (09/03 0808) SpO2:  [98 %-100 %] 100 % (09/03 0609) Weight:  [170 lb 13.7 oz (77.5 kg)] 170 lb 13.7 oz (77.5 kg) (09/03 0609) Last BM Date: 08/22/14  Filed Weights   08/22/14 1928 08/22/14 2235 08/24/14 0609  Weight: 170 lb (77.111 kg) 167 lb 5.3 oz (75.9 kg) 170 lb 13.7 oz (77.5 kg)    Intake/Output Summary (Last 24 hours) at 08/24/14 0842 Last data filed at 08/24/14 0802  Gross per 24 hour  Intake    246 ml  Output      0 ml  Net    246 ml    Telemetry:afib rates 70s this AM, some rates in 40s in very early AM hours  Exam:  General: NAD  Resp: CTAB  Cardiac: irreg, no m/r/g, no JVD  GI: abdomen soft, NT, ND  MSK: no no LE edema  Neuro: no focal deficits  Psych: appropriate affect  Lab Results:  Basic Metabolic Panel:  Recent Labs Lab 08/22/14 1937 08/23/14 0411  NA 133* 137  K 4.4 4.7  CL 99 100  CO2 19 24  GLUCOSE 362* 374*  BUN 26* 26*  CREATININE 1.73* 1.47*  CALCIUM 8.7 9.0    Liver Function Tests:  Recent Labs Lab 08/23/14 0411  AST 13  ALT 16  ALKPHOS 71  BILITOT 0.3  PROT 7.1  ALBUMIN 3.6    CBC:  Recent Labs Lab 08/22/14 1937 08/23/14 0411  WBC 10.3 10.0  HGB 14.1 13.7  HCT 39.8 40.1  MCV 91.5 92.2  PLT 181 162    Cardiac Enzymes:  Recent Labs Lab 08/22/14 2304 08/23/14 0411 08/23/14 1013  TROPONINI <0.30 <0.30 <0.30    BNP:  Recent Labs  12/12/13 0906 06/11/14 0229 08/22/14 1937  PROBNP 4015.0* 2933.0* 1437.0*    Coagulation:  Recent Labs Lab 08/22/14 1937  INR 1.36    ECG:   Medications:   Scheduled Medications: . aspirin EC  81 mg Oral Daily  . atorvastatin  80 mg Oral q1800  .  carvedilol  25 mg Oral BID WC  . dabigatran  150 mg Oral BID  . digoxin  125 mcg Oral Daily  . insulin aspart  0-9 Units Subcutaneous TID AC & HS  . insulin detemir  30 Units Subcutaneous q morning - 10a  . isosorbide mononitrate  30 mg Oral Daily  . lisinopril  5 mg Oral Daily  . ranolazine  1,000 mg Oral BID  . sodium chloride  3 mL Intravenous Q12H  . sodium chloride  3 mL Intravenous Q12H     Infusions:     PRN Medications:  sodium chloride, morphine injection, sodium chloride     Assessment/Plan   1. CAD  - no evidence of ACS at this time, unclear if symptoms are truly cardiac  - long history of recurrent chest pain, stable coronaries by last cath in 2014  - he has significant CAD that is not amenable to revascularization, managed medically  - antianginal therapy has been somewhat limited due to low blood pressures  - will increase ranexa to 1000 mg bid  - no recurrent symptoms  overnight, continue medical manamgnent   2. Chronic systolic heart failure  - appears euvolemic, continue current meds   No further testing or cardiac interventions planned at this time, ok for discharge from cardiac standpoint. He may follow up with NP Lyman Bishop in 2 weeks. Will sign off inpatient care.   Dominga Ferry MD      Dina Rich, M.D., F.A.C.C.

## 2014-08-24 NOTE — Progress Notes (Signed)
Late entry 08/24/14, 1930. Patient walked down the hallway and stated he was going downstairs. Educated patient on reasons he could not leave the floor. Placing the patient on high fall precautions with education, placing bed alarm on patient.

## 2014-08-24 NOTE — Progress Notes (Signed)
Inpatient Diabetes Program Recommendations  AACE/ADA: New Consensus Statement on Inpatient Glycemic Control (2013)  Target Ranges:  Prepandial:   less than 140 mg/dL      Peak postprandial:   less than 180 mg/dL (1-2 hours)      Critically ill patients:  140 - 180 mg/dL   Results for [name], [name] (MRN [id number]) as of 08/24/2014 11:23  Ref. Range 08/23/2014 08:34 08/23/2014 11:54 08/23/2014 16:45 08/23/2014 20:58 08/24/2014 07:44  Glucose-Capillary Latest Range: 70-99 mg/dL 409 (H) 811 (H) 914 (H) 370 (H) 231 (H)   Diabetes history: DM2 Outpatient Diabetes medications: Levemir 30 units QAM, Novolog 0-8 units QID Current orders for Inpatient glycemic control: Levemir 30 units QAM, Novolog 0-9 units ACHS  Inpatient Diabetes Program Recommendations Insulin - Basal: Please consider increasing Levemir to 35 units QAM. Correction (SSI): Please consider increasing Novolog correction to moderate scale ACHS. Meal Coverage: Please consider ordering Novolog 5 units TID with meals for meal coverage.  Thanks, [name], RN, MSN, CCRN Diabetes Coordinator Inpatient Diabetes Program [phone] (Team Pager) [phone] ([facility] office) [phone] [facility] office)

## 2014-08-25 DIAGNOSIS — R0789 Other chest pain: Secondary | ICD-10-CM | POA: Diagnosis not present

## 2014-08-25 LAB — GLUCOSE, CAPILLARY: Glucose-Capillary: 164 mg/dL — ABNORMAL HIGH (ref 70–99)

## 2014-08-25 NOTE — Discharge Summary (Signed)
Physician Discharge Summary  [name] WUJ:[id number] DOB: 1945-12-30 DOA: 08/22/2014  PCP: Alice Reichert, MD  Admit date: 08/22/2014 Discharge date: 08/25/2014     Discharge Diagnoses:  1. Coronary artery disease no evidence of acute ACS 2. Diabetes mellitus type 2 3. Systolic chronic congestive heart failure 4. Atrial fibrillation with controlled rate 5. Hyperlipidemia 6. Degenerative disc disease 7.   Discharge Condition: Stable Disposition: Home  Diet recommendation: 2000 Carey ADA diet low sodium  Filed Weights   08/22/14 1928 08/22/14 2235 08/24/14 0609  Weight: 77.111 kg (170 lb) 75.9 kg (167 lb 5.3 oz) 77.5 kg (170 lb 13.7 oz)    History of present illness:  The patient was admitted through the ED initially with complaint of substernal chest pain rated 8/10. Patient does have history coronary artery disease systolic dysfunction with severe ejection fraction 35-40%. With these symptoms that persisted it was felt he should be admitted to the hospital for further evaluation and rule out acute coronary syndrome  Hospital Course:  The patient was admitted to MedSurg floor and monitored. He was continued on the following medications. His anterior chest pain subsided. His troponins remained normal. Given fentanyl for pain he was seen by cardiology and it was felt he had no evidence of ACS at this time it was felt that he had significant coronary artery disease not amenable to revascularization. Also it he has chronic systolic heart failure appears euvolemic and was recommended that he continue current medications. Patient improved on previous medications and it was felt he could be discharged   Discharge Instructions Patient was instructed to continue medications listed below. History return to primary care physician's office in one-week for followup    Medication List         aspirin 325 MG EC tablet  Take 1 tablet (325 mg total) by mouth daily.     carvedilol 25 MG  tablet  Commonly known as:  COREG  Take 25 mg by mouth 2 (two) times daily.     digoxin 0.125 MG tablet  Commonly known as:  LANOXIN  Take 125 mcg by mouth daily.     furosemide 40 MG tablet  Commonly known as:  LASIX  Take 40 mg by mouth every other day.     HYDROcodone-acetaminophen 10-325 MG per tablet  Commonly known as:  NORCO  Take 1 tablet by mouth every 4 (four) hours as needed for moderate pain.     insulin detemir 100 UNIT/ML injection  Commonly known as:  LEVEMIR  Inject 30 Units into the skin every morning.     isosorbide mononitrate 30 MG 24 hr tablet  Commonly known as:  IMDUR  Take 1 tablet (30 mg total) by mouth daily.     lisinopril 5 MG tablet  Commonly known as:  PRINIVIL,ZESTRIL  Take 1 tablet (5 mg total) by mouth daily.     nitroGLYCERIN 0.4 MG SL tablet  Commonly known as:  NITROSTAT  Place 1 tablet (0.4 mg total) under the tongue every 5 (five) minutes as needed.     NOVOLOG FLEXPEN 100 UNIT/ML FlexPen  Generic drug:  insulin aspart  Inject 0-8 Units into the skin 4 (four) times daily. Per sliding scale     potassium chloride SA 20 MEQ tablet  Commonly known as:  K-DUR,KLOR-CON  TAKE ONE TABLET TWICE DAILY     PRADAXA 150 MG Caps capsule  Generic drug:  dabigatran  Take 150 mg by mouth 2 (two) times daily.  ranolazine 500 MG 12 hr tablet  Commonly known as:  RANEXA  Take 1 tablet (500 mg total) by mouth 2 (two) times daily.     rosuvastatin 40 MG tablet  Commonly known as:  CRESTOR  Take 40 mg by mouth daily.       No Known Allergies  The results of significant diagnostics from this hospitalization (including imaging, microbiology, ancillary and laboratory) are listed below for reference.    Significant Diagnostic Studies: Dg Chest 2 View  08/22/2014   CLINICAL DATA:  CP  EXAM: CHEST - 2 VIEW  COMPARISON:  06/11/2014  FINDINGS: Previous CABG. No acute bone abnormality. Lungs clear. Heart size normal. No effusion.  IMPRESSION: 1. No  acute disease post CABG.   Electronically Signed   By: Oley Balm M.D.   On: 08/22/2014 20:28    Microbiology: No results found for this or any previous visit (from the past 240 hour(s)).   Labs: Basic Metabolic Panel:  Recent Labs Lab 08/22/14 1937 08/23/14 0411  NA 133* 137  K 4.4 4.7  CL 99 100  CO2 19 24  GLUCOSE 362* 374*  BUN 26* 26*  CREATININE 1.73* 1.47*  CALCIUM 8.7 9.0   Liver Function Tests:  Recent Labs Lab 08/23/14 0411  AST 13  ALT 16  ALKPHOS 71  BILITOT 0.3  PROT 7.1  ALBUMIN 3.6   No results found for this basename: LIPASE, AMYLASE,  in the last 168 hours No results found for this basename: AMMONIA,  in the last 168 hours CBC:  Recent Labs Lab 08/22/14 1937 08/23/14 0411  WBC 10.3 10.0  NEUTROABS 6.8  --   HGB 14.1 13.7  HCT 39.8 40.1  MCV 91.5 92.2  PLT 181 162   Cardiac Enzymes:  Recent Labs Lab 08/22/14 1937 08/22/14 2304 08/23/14 0411 08/23/14 1013  TROPONINI <0.30 <0.30 <0.30 <0.30   BNP: BNP (last 3 results)  Recent Labs  12/12/13 0906 06/11/14 0229 08/22/14 1937  PROBNP 4015.0* 2933.0* 1437.0*   CBG:  Recent Labs Lab 08/23/14 2058 08/24/14 0744 08/24/14 1157 08/24/14 1711 08/24/14 2008  GLUCAP 370* 231* 298* 232* 362*    Principal Problem:   Chest pain Active Problems:   DIABETES MELLITUS, TYPE II   Hyperlipidemia   Atrial fibrillation   Chronic anticoagulation   Time coordinating discharge: 30 minutes Signed:  Butch Penny, MD 08/25/2014, 6:10 AM

## 2014-08-25 NOTE — Progress Notes (Signed)
UR review complete.  

## 2014-08-31 ENCOUNTER — Encounter: Payer: Self-pay | Admitting: Cardiology

## 2014-08-31 ENCOUNTER — Encounter: Payer: Medicare Other | Admitting: Cardiology

## 2014-08-31 NOTE — Progress Notes (Signed)
Clinical Summary [name] is a 67 y.o.male  1. ICM  - LVEF 35-40% by echo 04/2014, prior CABG in 1995 with subsequent stenting in 2009 and 2011.  - previously changed lasix to every other day in setting of diarrhea with extra volume losses and hypotension  - since making change denies any signicant SOB, no LE.   2. Afib  - denies any recent palpitations  - compliant with pradaxa   3. Syncope  - recent admission 04/2014 with syncope and clear evidence of hypovolemia and hypotension, no evidence of cardiac arrhythmia.  - denies any recent symptoms   4. Chest pain/CAD - last cath 11/2013 with severe 3 vessel CAD, SVG x2 chronically occluded, and patent LIMA-LAD. Overall stable unchanged coronary anatomy. Previous caths describe anatomy not amenable to revascularization.  - recent ER visit 05/30/14 with chest pain  - recent admit with chest pain, no evidence of ACS. His ranexa was increaed to  bid     Past Medical History  Diagnosis Date  . Coronary atherosclerosis of native coronary artery     Ant. MI in 4/95; PTCA of 90% prox & distal LAD unsuccessful-->  Urgent CABG-4/95 TO Cx; 50% RCA; ant. HK; EF of 50-55%; 08/2008: 3-V disease plus a  95% LIMA stenosis, treated with DES; occlusion of SVG to CX; RCA SVG stenosis--> PCI with DES  2010: in-stent restenosis in the LIMA-->cutting balloon; EF of 35-40%;  07/2010: TO of SVG to     RCA-medical therapy advised; EF of 30-35% by echo in 12/2010  . Atrial fibrillation 1995    1995, 2009; bradycardia with beta blocker therapy  . Tobacco abuse     40-50 pack years; currently one pack per day  . Peripheral vascular disease 1995    Abdominal aortic obstruction 1995-->vascular surgery  . Hyperlipidemia   . Type 2 diabetes mellitus   . DDD (degenerative disc disease)     Discectomy and fusion at L4 and L5  . Alcohol use 1999    Excessive alcohol use- quit in 1999  . Chronic systolic heart failure     LVEF 25-30% 2011  .  Non-compliance      No Known Allergies   Current Outpatient Prescriptions  Medication Sig Dispense Refill  . aspirin EC 325 MG EC tablet Take 1 tablet (325 mg total) by mouth daily.  30 tablet  0  . carvedilol (COREG) 25 MG tablet Take 25 mg by mouth 2 (two) times daily.      . dabigatran (PRADAXA) 150 MG CAPS capsule Take 150 mg by mouth 2 (two) times daily.      . digoxin (LANOXIN) 0.125 MG tablet Take 125 mcg by mouth daily.        . furosemide (LASIX) 40 MG tablet Take 40 mg by mouth every other day.       Marland Kitchen HYDROcodone-acetaminophen (NORCO) 10-325 MG per tablet Take 1 tablet by mouth every 4 (four) hours as needed for moderate pain.      Marland Kitchen insulin aspart (NOVOLOG FLEXPEN) 100 UNIT/ML FlexPen Inject 0-8 Units into the skin 4 (four) times daily. Per sliding scale      . insulin detemir (LEVEMIR) 100 UNIT/ML injection Inject 30 Units into the skin every morning.       . isosorbide mononitrate (IMDUR) 30 MG 24 hr tablet Take 1 tablet (30 mg total) by mouth daily.  30 tablet  5  . lisinopril (PRINIVIL,ZESTRIL) 5 MG tablet Take 1 tablet (5 mg  total) by mouth daily.  90 tablet  3  . nitroGLYCERIN (NITROSTAT) 0.4 MG SL tablet Place 1 tablet (0.4 mg total) under the tongue every 5 (five) minutes as needed.  25 tablet  6  . potassium chloride SA (K-DUR,KLOR-CON) 20 MEQ tablet TAKE ONE TABLET TWICE DAILY  60 tablet  3  . ranolazine (RANEXA) 500 MG 12 hr tablet Take 1 tablet (500 mg total) by mouth 2 (two) times daily.  60 tablet  5  . rosuvastatin (CRESTOR) 40 MG tablet Take 40 mg by mouth daily.       No current facility-administered medications for this visit.     Past Surgical History  Procedure Laterality Date  . Cholecystectomy  1995  . Lumbar fusion      +Discectomy;x2; L4 and L5  . Aorto-femoral bypass graft  1995     No Known Allergies    Family History  Problem Relation Age of Onset  . CAD Father      Social History [name] reports that he has been smoking  Cigarettes.  He has a 40 pack-year smoking history. He uses smokeless tobacco. [name] reports that he does not drink alcohol.   Review of Systems CONSTITUTIONAL: No weight loss, fever, chills, weakness or fatigue.  HEENT: Eyes: No visual loss, blurred vision, double vision or yellow sclerae.No hearing loss, sneezing, congestion, runny nose or sore throat.  SKIN: No rash or itching.  CARDIOVASCULAR:  RESPIRATORY: No shortness of breath, cough or sputum.  GASTROINTESTINAL: No anorexia, nausea, vomiting or diarrhea. No abdominal pain or blood.  GENITOURINARY: No burning on urination, no polyuria NEUROLOGICAL: No headache, dizziness, syncope, paralysis, ataxia, numbness or tingling in the extremities. No change in bowel or bladder control.  MUSCULOSKELETAL: No muscle, back pain, joint pain or stiffness.  LYMPHATICS: No enlarged nodes. No history of splenectomy.  PSYCHIATRIC: No history of depression or anxiety.  ENDOCRINOLOGIC: No reports of sweating, cold or heat intolerance. No polyuria or polydipsia.  Marland Kitchen   Physical Examination There were no vitals filed for this visit. There were no vitals filed for this visit.  Gen: resting comfortably, no acute distress HEENT: no scleral icterus, pupils equal round and reactive, no palptable cervical adenopathy,  CV Resp: Clear to auscultation bilaterally GI: abdomen is soft, non-tender, non-distended, normal bowel sounds, no hepatosplenomegaly MSK: extremities are warm, no edema.  Skin: warm, no rash Neuro:  no focal deficits Psych: appropriate affect   Diagnostic Studies 12/10/13  Findings:  Ao Pressure: 106/69 (83)  LV Pressure: 111/21/25  There was no signficant gradient across the aortic valve on pullback.  Left main: 50-60% ostial  Left anterior descending artery: The left anterior descending artery was completely occluded after a small diagonal Zerick Prevette. There was a 90% lesion in the ostium of the diagonal Vora Clover  Circumflex  artery: There was 70% proximal lesion. The circumflex artery was then completely occluded after a moderate sized marginal Ltanya Bayley. There were L to right collaterals to the distal RCA  Right coronary artery: The right coronary artery was completely occluded at its midportion. There were collaterals to the distal circumflex artery froma right ventricular Atonya Templer.  The LIMA graft to the LAD was patent. There was 30% narrowing at the ostium of the LAD at the proximal edge of the stent. The LAD itself was diffusely diseased with 70-80% diffuse disease distally. The LAD filled  the posterior descending Arnez Stoneking by collaterals.  The saphenous vein graft to the right coronary artery was completely occluded at  its origin at the stent site.  The saphenous vein graft to the circumflex system was completely occluded by prior study.  Left ventriculogram: The left ventriculogram performed in the RAO projection showed a dilated LV with EF 20-25% hypokinesis of the basilar to mid anterior wall and akinesis. Akinesis of the mid to distal anterior wall and entire apex. The basal to mid inferior wall was severely hypokinetic  Assessment:  1. Severe 3-VAD CAD  2. SVG x 2 (RCA and OM) chronically occluded  3. Patent LIMA to LAD  4. Severe LV dysfunction EF 20-25% with elevated LVEDP  04/2014 echo Study Conclusions  - Study data: Technically difficult study. - Left ventricle: The cavity size was normal. Wall thickness was increased increased in a pattern of mild to moderate LVH. Systolic function was moderately to severely reduced. The estimated ejection fraction was in the range of 35% to 40%. Compared to prior study 09/07/13 LVEF has improved. - Aortic valve: Mildly calcified annulus. Trileaflet; mildly thickened leaflets. Valve area: 1.65cm^2(VTI). Valve area: 1.76cm^2 (Vmax). - Mitral valve: Mildly calcified annulus. Mildly thickened leaflets . Mild regurgitation. - Left atrium: The atrium was severely  dilated. - Right ventricle: The cavity size was mildly dilated. - Right atrium: The atrium was severely dilated.       Assessment and Plan   1. ICM  - LVEF 35-40% bye cho 04/2014, he is NYHA III. Appears euvolemic today. Medical therapy has been limited due to hypotension, continue current meds   2. Afib  - no current symptoms, continue current meds   3. Syncope  - episode in setting of hypovolemia and hypotension, no evidence of cardiac arrhtyhmia. Diuretic decreased to every other day, denies any recent symptoms  - continue to follow   4. Chest pain/CAD  - isolated episode, he has known CAD that as of last cath 11/2013 is not amenable to revascularization. Antianginal therapy limited due to hypotension, encouraged to use prn NG as he has not been using it. - decrease ASA to 81 mg daily       Antoine Poche, M.D., F.A.C.C.

## 2014-09-01 ENCOUNTER — Emergency Department (HOSPITAL_COMMUNITY)
Admission: EM | Admit: 2014-09-01 | Discharge: 2014-09-02 | Disposition: A | Discharge status: Home / Self Care | Payer: Medicare Other | Attending: Emergency Medicine | Admitting: Emergency Medicine

## 2014-09-01 ENCOUNTER — Encounter (HOSPITAL_COMMUNITY): Payer: Self-pay | Admitting: Emergency Medicine

## 2014-09-01 DIAGNOSIS — R079 Chest pain, unspecified: Secondary | ICD-10-CM | POA: Insufficient documentation

## 2014-09-01 DIAGNOSIS — Z794 Long term (current) use of insulin: Secondary | ICD-10-CM | POA: Diagnosis not present

## 2014-09-01 DIAGNOSIS — I5022 Chronic systolic (congestive) heart failure: Secondary | ICD-10-CM | POA: Diagnosis not present

## 2014-09-01 DIAGNOSIS — E119 Type 2 diabetes mellitus without complications: Secondary | ICD-10-CM | POA: Diagnosis not present

## 2014-09-01 DIAGNOSIS — E785 Hyperlipidemia, unspecified: Secondary | ICD-10-CM | POA: Insufficient documentation

## 2014-09-01 DIAGNOSIS — Z7982 Long term (current) use of aspirin: Secondary | ICD-10-CM | POA: Diagnosis not present

## 2014-09-01 DIAGNOSIS — I2510 Atherosclerotic heart disease of native coronary artery without angina pectoris: Secondary | ICD-10-CM | POA: Diagnosis not present

## 2014-09-01 DIAGNOSIS — F17200 Nicotine dependence, unspecified, uncomplicated: Secondary | ICD-10-CM | POA: Diagnosis not present

## 2014-09-01 DIAGNOSIS — I499 Cardiac arrhythmia, unspecified: Secondary | ICD-10-CM | POA: Insufficient documentation

## 2014-09-01 DIAGNOSIS — R0789 Other chest pain: Secondary | ICD-10-CM | POA: Diagnosis not present

## 2014-09-01 DIAGNOSIS — Z79899 Other long term (current) drug therapy: Secondary | ICD-10-CM | POA: Diagnosis not present

## 2014-09-01 DIAGNOSIS — Z8739 Personal history of other diseases of the musculoskeletal system and connective tissue: Secondary | ICD-10-CM | POA: Diagnosis not present

## 2014-09-01 NOTE — ED Notes (Signed)
Pt c/o centralized, recurrent chest pressure and pain. Pt states he was watching tv when the pain began. Also c/o sob.

## 2014-09-02 ENCOUNTER — Emergency Department (HOSPITAL_COMMUNITY): Payer: Medicare Other

## 2014-09-02 DIAGNOSIS — R0789 Other chest pain: Secondary | ICD-10-CM | POA: Diagnosis not present

## 2014-09-02 LAB — CBC
HCT: 39.2 % (ref 39.0–52.0)
Hemoglobin: 13.8 g/dL (ref 13.0–17.0)
MCH: 32.5 pg (ref 26.0–34.0)
MCHC: 35.2 g/dL (ref 30.0–36.0)
MCV: 92.5 fL (ref 78.0–100.0)
Platelets: 166 10*3/uL (ref 150–400)
RBC: 4.24 MIL/uL (ref 4.22–5.81)
RDW: 14.1 % (ref 11.5–15.5)
WBC: 10.1 10*3/uL (ref 4.0–10.5)

## 2014-09-02 LAB — BASIC METABOLIC PANEL
Anion gap: 12 (ref 5–15)
BUN: 30 mg/dL — ABNORMAL HIGH (ref 6–23)
CO2: 25 mEq/L (ref 19–32)
Calcium: 8.9 mg/dL (ref 8.4–10.5)
Chloride: 100 mEq/L (ref 96–112)
Creatinine, Ser: 1.85 mg/dL — ABNORMAL HIGH (ref 0.50–1.35)
GFR, EST AFRICAN AMERICAN: 42 mL/min — AB (ref >90)
GFR, EST NON AFRICAN AMERICAN: 36 mL/min — AB (ref >90)
Glucose, Bld: 170 mg/dL — ABNORMAL HIGH (ref 70–99)
POTASSIUM: 4.3 meq/L (ref 3.7–5.3)
Sodium: 137 mEq/L (ref 137–147)

## 2014-09-02 LAB — TROPONIN I: Troponin I: <0.3 ng/mL (ref <0.30)

## 2014-09-02 LAB — DIGOXIN LEVEL: Digoxin Level: 0.6 ng/mL — ABNORMAL LOW (ref 0.8–2.0)

## 2014-09-02 LAB — PRO B NATRIURETIC PEPTIDE: PRO B NATRI PEPTIDE: 1831 pg/mL — AB (ref 0–125)

## 2014-09-02 MED ORDER — FUROSEMIDE 10 MG/ML IJ SOLN
40.0000 mg | Freq: Once | INTRAMUSCULAR | Status: DC
Start: 2014-09-02 — End: 2014-09-02

## 2014-09-02 MED ORDER — FUROSEMIDE 10 MG/ML IJ SOLN
20.0000 mg | Freq: Once | INTRAMUSCULAR | Status: AC
  Administered 2014-09-02: 20 mg via INTRAVENOUS
  Filled 2014-09-02: qty 2

## 2014-09-02 MED ORDER — NITROGLYCERIN 2 % TD OINT
1.0000 [in_us] | TOPICAL_OINTMENT | Freq: Once | TRANSDERMAL | Status: AC
Start: 2014-09-02 — End: 2014-09-02
  Administered 2014-09-02: 1 [in_us] via TOPICAL
  Filled 2014-09-02: qty 1

## 2014-09-02 MED ORDER — ASPIRIN 81 MG PO CHEW
324.0000 mg | CHEWABLE_TABLET | Freq: Once | ORAL | Status: AC
  Administered 2014-09-02: 324 mg via ORAL
  Filled 2014-09-02: qty 4

## 2014-09-02 NOTE — Discharge Instructions (Signed)
You were evaluated today for chest pain.   Your work-up is largely reassuring.  You do have known coronary artery disease and heart failure.  You need to continue to take your medications as directed and follow-up with your cardiologist asap for evaluation of today's symptoms.  See return precautions below.  Chest Pain (Nonspecific) It is often hard to give a specific diagnosis for the cause of chest pain. There is always a chance that your pain could be related to something serious, such as a heart attack or a blood clot in the lungs. You need to follow up with your health care provider for further evaluation. CAUSES   Heartburn.  Pneumonia or bronchitis.  Anxiety or stress.  Inflammation around your heart (pericarditis) or lung (pleuritis or pleurisy).  A blood clot in the lung.  A collapsed lung (pneumothorax). It can develop suddenly on its own (spontaneous pneumothorax) or from trauma to the chest.  Shingles infection (herpes zoster virus). The chest wall is composed of bones, muscles, and cartilage. Any of these can be the source of the pain.  The bones can be bruised by injury.  The muscles or cartilage can be strained by coughing or overwork.  The cartilage can be affected by inflammation and become sore (costochondritis). DIAGNOSIS  Lab tests or other studies may be needed to find the cause of your pain. Your health care provider may have you take a test called an ambulatory electrocardiogram (ECG). An ECG records your heartbeat patterns over a 24-hour period. You may also have other tests, such as:  Transthoracic echocardiogram (TTE). During echocardiography, sound waves are used to evaluate how blood flows through your heart.  Transesophageal echocardiogram (TEE).  Cardiac monitoring. This allows your health care provider to monitor your heart rate and rhythm in real time.  Holter monitor. This is a portable device that records your heartbeat and can help diagnose heart  arrhythmias. It allows your health care provider to track your heart activity for several days, if needed.  Stress tests by exercise or by giving medicine that makes the heart beat faster. TREATMENT   Treatment depends on what may be causing your chest pain. Treatment may include:  Acid blockers for heartburn.  Anti-inflammatory medicine.  Pain medicine for inflammatory conditions.  Antibiotics if an infection is present.  You may be advised to change lifestyle habits. This includes stopping smoking and avoiding alcohol, caffeine, and chocolate.  You may be advised to keep your head raised (elevated) when sleeping. This reduces the chance of acid going backward from your stomach into your esophagus. Most of the time, nonspecific chest pain will improve within 2-3 days with rest and mild pain medicine.  HOME CARE INSTRUCTIONS   If antibiotics were prescribed, take them as directed. Finish them even if you start to feel better.  For the next few days, avoid physical activities that bring on chest pain. Continue physical activities as directed.  Do not use any tobacco products, including cigarettes, chewing tobacco, or electronic cigarettes.  Avoid drinking alcohol.  Only take medicine as directed by your health care provider.  Follow your health care provider's suggestions for further testing if your chest pain does not go away.  Keep any follow-up appointments you made. If you do not go to an appointment, you could develop lasting (chronic) problems with pain. If there is any problem keeping an appointment, call to reschedule. SEEK MEDICAL CARE IF:   Your chest pain does not go away, even after treatment.  You have a rash with blisters on your chest.  You have a fever. SEEK IMMEDIATE MEDICAL CARE IF:   You have increased chest pain or pain that spreads to your arm, neck, jaw, back, or abdomen.  You have shortness of breath.  You have an increasing cough, or you cough up  blood.  You have severe back or abdominal pain.  You feel nauseous or vomit.  You have severe weakness.  You faint.  You have chills. This is an emergency. Do not wait to see if the pain will go away. Get medical help at once. Call your local emergency services (911 in [place] Do not drive yourself to the hospital. MAKE SURE YOU:   Understand these instructions.  Will watch your condition.  Will get help right away if you are not doing well or get worse. Document Released: 09/17/2005 Document Revised: 12/13/2013 Document Reviewed: 07/13/2008 [facility] Patient Information 2015 [place], [place]. This information is not intended to replace advice given to you by your health care provider. Make sure you discuss any questions you have with your health care provider.

## 2014-09-02 NOTE — ED Provider Notes (Signed)
CSN: [id number]     Arrival date & time 09/01/14  2350 History   First MD Initiated Contact with Patient 09/02/14 0010   This chart was scribed for Shon Baton, MD by Gwenevere Abbot, ED scribe. This patient was seen in room APA04/APA04 and the patient's care was started at 12:20 AM.     Chief Complaint  Patient presents with  . Chest Pain   Patient is a 68 y.o. male presenting with chest pain. The history is provided by the patient. No language interpreter was used.  Chest Pain Associated symptoms: no abdominal pain, no back pain, no fever, no headache, no nausea, no shortness of breath and not vomiting    HPI Comments:  RODMAN RECUPERO is a 68 y.o. male who presents to the Emergency Department complaining of CP, with associated symptoms of SOB, onset 1 hour ago while he was watching television. Pt rates pain as 7 or 8 out of 10 and that he has experienced this pain previously. Pt reports that nothing modifies pain. Pt reports that he is scheduled to see his cardiologist this week. Pt reports the he is a smoker. Pt reports that he is taking medication, and that he took a full dose of aspirin on today.   Of note, patient recently admitted for chest pain rule out.  He continues to be medically managed.  He was just seen by his cardiologist on 9/10.  Patient's medical management is somewhat limited secondary to hypotension. He is known heart failure with an EF of 25-30%.  Patient has a history of recurrent chest pain. Last cardiac catheterization was in 2014. Patient ruled out during last admission.    Past Medical History  Diagnosis Date  . Coronary atherosclerosis of native coronary artery     Ant. MI in 4/95; PTCA of 90% prox & distal LAD unsuccessful-->  Urgent CABG-4/95 TO Cx; 50% RCA; ant. HK; EF of 50-55%; 08/2008: 3-V disease plus a  95% LIMA stenosis, treated with DES; occlusion of SVG to CX; RCA SVG stenosis--> PCI with DES  2010: in-stent restenosis in the LIMA-->cutting balloon; EF  of 35-40%;  07/2010: TO of SVG to     RCA-medical therapy advised; EF of 30-35% by echo in 12/2010  . Atrial fibrillation 1995    1995, 2009; bradycardia with beta blocker therapy  . Tobacco abuse     40-50 pack years; currently one pack per day  . Peripheral vascular disease 1995    Abdominal aortic obstruction 1995-->vascular surgery  . Hyperlipidemia   . Type 2 diabetes mellitus   . DDD (degenerative disc disease)     Discectomy and fusion at L4 and L5  . Alcohol use 1999    Excessive alcohol use- quit in 1999  . Chronic systolic heart failure     LVEF 25-30% 2011  . Non-compliance    Past Surgical History  Procedure Laterality Date  . Cholecystectomy  1995  . Lumbar fusion      +Discectomy;x2; L4 and L5  . Aorto-femoral bypass graft  1995   Family History  Problem Relation Age of Onset  . CAD Father    History  Substance Use Topics  . Smoking status: Current Every Day Smoker -- 1.00 packs/day for 40 years    Types: Cigarettes  . Smokeless tobacco: Current User     Comment: refused  . Alcohol Use: No     Comment: former    Review of Systems  Constitutional: Negative.  Negative for  fever.  Respiratory: Positive for chest tightness. Negative for shortness of breath.   Cardiovascular: Positive for chest pain. Negative for leg swelling.  Gastrointestinal: Negative.  Negative for nausea, vomiting and abdominal pain.  Genitourinary: Negative.  Negative for dysuria.  Musculoskeletal: Negative for back pain.  Skin: Negative for rash.  Neurological: Negative for headaches.  All other systems reviewed and are negative.     Allergies  Review of patient's allergies indicates no known allergies.  Home Medications   Prior to Admission medications   Medication Sig Start Date End Date Taking? Authorizing Provider  aspirin EC 325 MG EC tablet Take 1 tablet (325 mg total) by mouth daily. 05/06/14   Angus Edilia Bo, MD  carvedilol (COREG) 25 MG tablet Take 25 mg by mouth 2  (two) times daily.    Historical Provider, MD  dabigatran (PRADAXA) 150 MG CAPS capsule Take 150 mg by mouth 2 (two) times daily.    Historical Provider, MD  digoxin (LANOXIN) 0.125 MG tablet Take 125 mcg by mouth daily.      Historical Provider, MD  furosemide (LASIX) 40 MG tablet Take 40 mg by mouth every other day.  05/06/14   Angus Edilia Bo, MD  HYDROcodone-acetaminophen (NORCO) 10-325 MG per tablet Take 1 tablet by mouth every 4 (four) hours as needed for moderate pain.    Historical Provider, MD  insulin aspart (NOVOLOG FLEXPEN) 100 UNIT/ML FlexPen Inject 0-8 Units into the skin 4 (four) times daily. Per sliding scale    Historical Provider, MD  insulin detemir (LEVEMIR) 100 UNIT/ML injection Inject 30 Units into the skin every morning.     Historical Provider, MD  isosorbide mononitrate (IMDUR) 30 MG 24 hr tablet Take 1 tablet (30 mg total) by mouth daily. 09/08/13   Angus Edilia Bo, MD  lisinopril (PRINIVIL,ZESTRIL) 5 MG tablet Take 1 tablet (5 mg total) by mouth daily. 02/28/14   Antoine Poche, MD  nitroGLYCERIN (NITROSTAT) 0.4 MG SL tablet Place 1 tablet (0.4 mg total) under the tongue every 5 (five) minutes as needed. 05/24/12   Kathlen Brunswick, MD  potassium chloride SA (K-DUR,KLOR-CON) 20 MEQ tablet TAKE ONE TABLET TWICE DAILY 08/24/14   Antoine Poche, MD  ranolazine (RANEXA) 500 MG 12 hr tablet Take 1 tablet (500 mg total) by mouth 2 (two) times daily. 06/13/14   Angus Edilia Bo, MD  rosuvastatin (CRESTOR) 40 MG tablet Take 40 mg by mouth daily.    Historical Provider, MD   BP 101/51  Pulse 72  Temp(Src) 97.9 F (36.6 C)  Resp 20  Ht  (1.753 m)  Wt 170 lb (77.111 kg)  BMI 25.09 kg/m2  SpO2 100% Physical Exam  Nursing note and vitals reviewed. Constitutional: He is oriented to person, place, and time. No distress.  Disheveled  HENT:  Head: Normocephalic and atraumatic.  Mouth/Throat: Oropharynx is clear and moist.  Eyes: Pupils are equal, round, and reactive to  light.  Cardiovascular: Normal rate and normal heart sounds.   No murmur heard. Irregular rhythm  Pulmonary/Chest: Effort normal and breath sounds normal. No respiratory distress. He has no wheezes. He exhibits no tenderness.  Midline sternotomy scar  Abdominal: Soft. Bowel sounds are normal. There is no tenderness. There is no rebound.  Musculoskeletal: He exhibits no edema.  Neurological: He is alert and oriented to person, place, and time.  Skin: Skin is warm and dry.  Psychiatric: He has a normal mood and affect.    ED Course  Procedures  DIAGNOSTIC STUDIES: Oxygen Saturation is 100% on RA, normal by my interpretation.    Labs Review Labs Reviewed  BASIC METABOLIC PANEL - Abnormal; Notable for the following:    Glucose, Bld 170 (*)    BUN 30 (*)    Creatinine, Ser 1.85 (*)    GFR calc non Af Amer 36 (*)    GFR calc Af Amer 42 (*)    All other components within normal limits  PRO B NATRIURETIC PEPTIDE - Abnormal; Notable for the following:    Pro B Natriuretic peptide (BNP) 1831.0 (*)    All other components within normal limits  DIGOXIN LEVEL - Abnormal; Notable for the following:    Digoxin Level 0.6 (*)    All other components within normal limits  CBC  TROPONIN I  TROPONIN I    Imaging Review Dg Chest Port 1 View  09/02/2014   CLINICAL DATA:  Chest pain and shortness of breath. History of smoking.  EXAM: PORTABLE CHEST - 1 VIEW  COMPARISON:  Chest radiograph from 08/22/2014  FINDINGS: The lungs are well-aerated. Mild vascular congestion is noted. Mildly increased interstitial markings are nonspecific in appearance, without definite pulmonary edema. There is no evidence of pleural effusion or pneumothorax.  The cardiomediastinal silhouette is borderline normal in size. The patient is status post median sternotomy. No acute osseous abnormalities are seen.  IMPRESSION: Mild vascular congestion noted; mildly increased interstitial markings are nonspecific, without  definite pulmonary edema.   Electronically Signed   By: Roanna Raider M.D.   On: 09/02/2014 00:44     EKG Interpretation   Date/Time:  Saturday September 02 2014 00:10:55 EDT Ventricular Rate:  69 PR Interval:    QRS Duration: 84 QT Interval:  399 QTC Calculation: 427 R Axis:   51 Text Interpretation:  Atrial fibrillation Probable anterior infarct, age  indeterminate Similar to prior Confirmed by HORTON  MD, COURTNEY ([id number])  on 09/02/2014 12:19:50 AM      MDM   Final diagnoses:  Chest pain, unspecified chest pain type   Patient presents for chest pain. History of coronary artery disease and CHF. Nontoxic on exam. Blood pressure 101/51 which is at patient's baseline.  No evidence of overt volume overload on exam. He is currently in atrial fibrillation. Patient endorses compliance with medications.  Recent admission and followup with cardiology. EKG reassuring. Chest x-ray shows mild vascular congestion without pulmonary edema. BNP is mildly elevated at 1831. Patient has no evidence of lower extremity edema; however, will give 20 mg IV Lasix. Initial troponin is negative.  Given recent evaluation by cardiologist and recent admission for chest pain rule out with question of whether recurrent chest pain is an ACS equivalent, will obtain a delta troponin.  Repeat troponin continues to be reassuring. Patient has remained stable in the emergency room she's currently chest pain-free. Discuss with patient continued medical management and close followup with cardiology. Patient stated understanding. He was given strict return precautions.  After history, exam, and medical workup I feel the patient has been appropriately medically screened and is safe for discharge home. Pertinent diagnoses were discussed with the patient. Patient was given return precautions.  I personally performed the services described in this documentation, which was scribed in my presence. The recorded information has been  reviewed and is accurate.     Shon Baton, MD 09/02/14 0600

## 2014-09-19 ENCOUNTER — Ambulatory Visit (INDEPENDENT_AMBULATORY_CARE_PROVIDER_SITE_OTHER): Payer: Medicare Other | Admitting: Cardiology

## 2014-09-19 ENCOUNTER — Encounter: Payer: Self-pay | Admitting: Cardiology

## 2014-09-19 VITALS — BP 98/58 | HR 60 | Ht 69.0 in | Wt 165.0 lb

## 2014-09-19 DIAGNOSIS — I5022 Chronic systolic (congestive) heart failure: Secondary | ICD-10-CM

## 2014-09-19 DIAGNOSIS — I7090 Unspecified atherosclerosis: Secondary | ICD-10-CM

## 2014-09-19 DIAGNOSIS — I2510 Atherosclerotic heart disease of native coronary artery without angina pectoris: Secondary | ICD-10-CM

## 2014-09-19 NOTE — Patient Instructions (Signed)
Your physician recommends that you schedule a follow-up appointment in: 3 months with Dr. Wyline MoodBranch  [facility]  Your physician recommends that you continue on your current medications as directed. Please refer to the Current Medication list given to you today.   Thank you for choosing [facility]!!

## 2014-09-20 NOTE — Progress Notes (Addendum)
Clinical Summary [name] is a 68 y.o.male seen today for follow up of the following medical problems. This is a focused visit regarding his history of CAD and chronic systolic HF. For more detailed clinic note of his multiple medical problems refer to prior clinic notes.   1. Chronic systolic HF - LVEF 35-40% by echo 04/2014, prior CABG in 1995 with subsequent stenting in 2009 and 2011.  - denies any current SOB, no DOE. Compliant with meds, limiting sodium intake.  2. CAD - last cath 11/2013 with severe 3 vessel CAD, SVG x2 chronically occluded, and patent LIMA-LAD. Overall stable unchanged coronary anatomy. Previous caths describe anatomy not amenable to revascularization.  - intermittent chest pain at times, last admission we restarted his ranexa. Medical therapy for his angina has been limited due to low bp's - denies any recent chest pain, reports one episode approx once a month. Does not use his NG when he has symptoms     Past Medical History  Diagnosis Date  . Coronary atherosclerosis of native coronary artery     Ant. MI in 4/95; PTCA of 90% prox & distal LAD unsuccessful-->  Urgent CABG-4/95 TO Cx; 50% RCA; ant. HK; EF of 50-55%; 08/2008: 3-V disease plus a  95% LIMA stenosis, treated with DES; occlusion of SVG to CX; RCA SVG stenosis--> PCI with DES  2010: in-stent restenosis in the LIMA-->cutting balloon; EF of 35-40%;  07/2010: TO of SVG to     RCA-medical therapy advised; EF of 30-35% by echo in 12/2010  . Atrial fibrillation 1995    1995, 2009; bradycardia with beta blocker therapy  . Tobacco abuse     40-50 pack years; currently one pack per day  . Peripheral vascular disease 1995    Abdominal aortic obstruction 1995-->vascular surgery  . Hyperlipidemia   . Type 2 diabetes mellitus   . DDD (degenerative disc disease)     Discectomy and fusion at L4 and L5  . Alcohol use 1999    Excessive alcohol use- quit in 1999  . Chronic systolic heart failure     LVEF 25-30%  2011  . Non-compliance      No Known Allergies   Current Outpatient Prescriptions  Medication Sig Dispense Refill  . aspirin EC 325 MG EC tablet Take 1 tablet (325 mg total) by mouth daily.  30 tablet  0  . carvedilol (COREG) 25 MG tablet Take 25 mg by mouth 2 (two) times daily.      . dabigatran (PRADAXA) 150 MG CAPS capsule Take 150 mg by mouth 2 (two) times daily.      . digoxin (LANOXIN) 0.125 MG tablet Take 125 mcg by mouth daily.        . furosemide (LASIX) 40 MG tablet Take 40 mg by mouth every other day.       Marland Kitchen. HYDROcodone-acetaminophen (NORCO) 10-325 MG per tablet Take 1 tablet by mouth every 4 (four) hours as needed for moderate pain.      Marland Kitchen. insulin aspart (NOVOLOG FLEXPEN) 100 UNIT/ML FlexPen Inject 0-8 Units into the skin 4 (four) times daily. Per sliding scale      . insulin detemir (LEVEMIR) 100 UNIT/ML injection Inject 30 Units into the skin every morning.       . isosorbide mononitrate (IMDUR) 30 MG 24 hr tablet Take 1 tablet (30 mg total) by mouth daily.  30 tablet  5  . lisinopril (PRINIVIL,ZESTRIL) 5 MG tablet Take 1 tablet (5 mg total)  by mouth daily.  90 tablet  3  . nitroGLYCERIN (NITROSTAT) 0.4 MG SL tablet Place 1 tablet (0.4 mg total) under the tongue every 5 (five) minutes as needed.  25 tablet  6  . potassium chloride SA (K-DUR,KLOR-CON) 20 MEQ tablet TAKE ONE TABLET TWICE DAILY  60 tablet  3  . ranolazine (RANEXA) 500 MG 12 hr tablet Take 1 tablet (500 mg total) by mouth 2 (two) times daily.  60 tablet  5  . rosuvastatin (CRESTOR) 40 MG tablet Take 40 mg by mouth daily.       No current facility-administered medications for this visit.     Past Surgical History  Procedure Laterality Date  . Cholecystectomy  1995  . Lumbar fusion      +Discectomy;x2; L4 and L5  . Aorto-femoral bypass graft  1995     No Known Allergies    Family History  Problem Relation Age of Onset  . CAD Father      Social History Mr. Esterly reports that he has been  smoking Cigarettes.  He has a 40 pack-year smoking history. He uses smokeless tobacco. Mr. Revelo reports that he does not drink alcohol.   Review of Systems CONSTITUTIONAL: No weight loss, fever, chills, weakness or fatigue.  HEENT: Eyes: No visual loss, blurred vision, double vision or yellow sclerae.No hearing loss, sneezing, congestion, runny nose or sore throat.  SKIN: No rash or itching.  CARDIOVASCULAR: per HPI RESPIRATORY: No shortness of breath, cough or sputum.  GASTROINTESTINAL: No anorexia, nausea, vomiting or diarrhea. No abdominal pain or blood.  GENITOURINARY: No burning on urination, no polyuria NEUROLOGICAL: No headache, dizziness, syncope, paralysis, ataxia, numbness or tingling in the extremities. No change in bowel or bladder control.  MUSCULOSKELETAL: No muscle, back pain, joint pain or stiffness.  LYMPHATICS: No enlarged nodes. No history of splenectomy.  PSYCHIATRIC: No history of depression or anxiety.  ENDOCRINOLOGIC: No reports of sweating, cold or heat intolerance. No polyuria or polydipsia.  Marland Kitchen   Physical Examination Filed Vitals:   09/19/14 0951  BP: 98/58  Pulse: 60   Filed Weights   09/19/14 0951  Weight: 165 lb (74.844 kg)    Gen: resting comfortably, no acute distress HEENT: no scleral icterus, pupils equal round and reactive, no palptable cervical adenopathy,  CV: RRR, no m/r/g, no JVD Resp: Clear to auscultation bilaterally GI: abdomen is soft, non-tender, non-distended, normal bowel sounds, no hepatosplenomegaly MSK: extremities are warm, no edema.  Skin: warm, no rash Neuro:  no focal deficits Psych: appropriate affect    Assessment and Plan  1. Chronic systolic HF -appears euvolemic, continue current meds  2. CAD - significant CAD from last cath with no targets for revasc, stable intermittent chest pain somewhat improved since restarting ranexa. Encouraged to use his SL NG when he has symptoms - continue current meds   F/u 3  months      Antoine Poche, M.D.

## 2014-11-20 ENCOUNTER — Other Ambulatory Visit: Payer: Self-pay | Admitting: Adult Health

## 2014-11-21 MED ORDER — FUROSEMIDE 40 MG PO TABS: 40.0000 mg | ORAL_TABLET | ORAL | Status: AC

## 2014-11-21 MED ORDER — FUROSEMIDE 40 MG PO TABS: 40.0000 mg | ORAL_TABLET | ORAL | Status: DC

## 2014-11-21 NOTE — Telephone Encounter (Signed)
Refill complete 

## 2014-11-30 ENCOUNTER — Encounter (HOSPITAL_COMMUNITY): Payer: Self-pay | Admitting: Cardiovascular Disease

## 2014-12-13 ENCOUNTER — Encounter: Payer: Medicare Other | Admitting: Cardiology

## 2014-12-13 ENCOUNTER — Encounter: Payer: Self-pay | Admitting: Cardiology

## 2014-12-13 NOTE — Progress Notes (Signed)
Clinical Summary [name] is a 68 y.o.male seen today for follow up of the following medical problems.   1. Chronic systolic HF - LVEF 35-40% by echo 04/2014, prior CABG in 1995 with subsequent stenting in 2009 and 2011.  - denies any current SOB, no DOE. Compliant with meds, limiting sodium intake.  2. CAD - last cath 11/2013 with severe 3 vessel CAD, SVG x2 chronically occluded, and patent LIMA-LAD. Overall stable unchanged coronary anatomy. Previous caths describe anatomy not amenable to revascularization.  - intermittent chest pain at times, last admission we restarted his ranexa. Medical therapy for his angina has been limited due to low bp's - denies any recent chest pain, reports one episode approx once a month. Does not use his NG when he has symptoms  Past Medical History  Diagnosis Date  . Coronary atherosclerosis of native coronary artery     Ant. MI in 4/95; PTCA of 90% prox & distal LAD unsuccessful-->  Urgent CABG-4/95 TO Cx; 50% RCA; ant. HK; EF of 50-55%; 08/2008: 3-V disease plus a  95% LIMA stenosis, treated with DES; occlusion of SVG to CX; RCA SVG stenosis--> PCI with DES  2010: in-stent restenosis in the LIMA-->cutting balloon; EF of 35-40%;  07/2010: TO of SVG to     RCA-medical therapy advised; EF of 30-35% by echo in 12/2010  . Atrial fibrillation 1995    1995, 2009; bradycardia with beta blocker therapy  . Tobacco abuse     40-50 pack years; currently one pack per day  . Peripheral vascular disease 1995    Abdominal aortic obstruction 1995-->vascular surgery  . Hyperlipidemia   . Type 2 diabetes mellitus   . DDD (degenerative disc disease)     Discectomy and fusion at L4 and L5  . Alcohol use 1999    Excessive alcohol use- quit in 1999  . Chronic systolic heart failure     LVEF 25-30% 2011  . Non-compliance      No Known Allergies   Current Outpatient Prescriptions  Medication Sig Dispense Refill  . aspirin EC 325 MG EC tablet Take 1 tablet (325  mg total) by mouth daily. 30 tablet 0  . carvedilol (COREG) 25 MG tablet Take 25 mg by mouth 2 (two) times daily.    . dabigatran (PRADAXA) 150 MG CAPS capsule Take 150 mg by mouth 2 (two) times daily.    . digoxin (LANOXIN) 0.125 MG tablet Take 125 mcg by mouth daily.      . furosemide (LASIX) 40 MG tablet Take 1 tablet (40 mg total) by mouth every other day. 30 tablet 6  . HYDROcodone-acetaminophen (NORCO) 10-325 MG per tablet Take 1 tablet by mouth every 4 (four) hours as needed for moderate pain.    Marland Kitchen. insulin aspart (NOVOLOG FLEXPEN) 100 UNIT/ML FlexPen Inject 0-8 Units into the skin 4 (four) times daily. Per sliding scale    . insulin detemir (LEVEMIR) 100 UNIT/ML injection Inject 30 Units into the skin every morning.     . isosorbide mononitrate (IMDUR) 30 MG 24 hr tablet Take 1 tablet (30 mg total) by mouth daily. 30 tablet 5  . lisinopril (PRINIVIL,ZESTRIL) 5 MG tablet Take 1 tablet (5 mg total) by mouth daily. 90 tablet 3  . nitroGLYCERIN (NITROSTAT) 0.4 MG SL tablet Place 1 tablet (0.4 mg total) under the tongue every 5 (five) minutes as needed. 25 tablet 6  . potassium chloride SA (K-DUR,KLOR-CON) 20 MEQ tablet TAKE ONE TABLET TWICE DAILY 60  tablet 3  . ranolazine (RANEXA) 500 MG 12 hr tablet Take 1 tablet (500 mg total) by mouth 2 (two) times daily. 60 tablet 5  . rosuvastatin (CRESTOR) 40 MG tablet Take 40 mg by mouth daily.     No current facility-administered medications for this visit.     Past Surgical History  Procedure Laterality Date  . Cholecystectomy  1995  . Lumbar fusion      +Discectomy;x2; L4 and L5  . Aorto-femoral bypass graft  1995  . Left heart catheterization with coronary angiogram N/A 12/14/2013    Procedure: LEFT HEART CATHETERIZATION WITH CORONARY ANGIOGRAM;  Surgeon: Micheline ChapmanMichael D Cooper, MD;  Location: [facility] C[facility];  Service: Cardiovascular;  Laterality: N/A;     No Known Allergies    Family History  Problem Relation Age of Onset  . CAD Father       Social History [name] reports that he has been smoking Cigarettes.  He has a 40 pack-year smoking history. He uses smokeless tobacco. [name] reports that he does not drink alcohol.   Review of Systems CONSTITUTIONAL: No weight loss, fever, chills, weakness or fatigue.  HEENT: Eyes: No visual loss, blurred vision, double vision or yellow sclerae.No hearing loss, sneezing, congestion, runny nose or sore throat.  SKIN: No rash or itching.  CARDIOVASCULAR:  RESPIRATORY: No shortness of breath, cough or sputum.  GASTROINTESTINAL: No anorexia, nausea, vomiting or diarrhea. No abdominal pain or blood.  GENITOURINARY: No burning on urination, no polyuria NEUROLOGICAL: No headache, dizziness, syncope, paralysis, ataxia, numbness or tingling in the extremities. No change in bowel or bladder control.  MUSCULOSKELETAL: No muscle, back pain, joint pain or stiffness.  LYMPHATICS: No enlarged nodes. No history of splenectomy.  PSYCHIATRIC: No history of depression or anxiety.  ENDOCRINOLOGIC: No reports of sweating, cold or heat intolerance. No polyuria or polydipsia.  Marland Kitchen.   Physical Examination There were no vitals filed for this visit. There were no vitals filed for this visit.  Gen: resting comfortably, no acute distress HEENT: no scleral icterus, pupils equal round and reactive, no palptable cervical adenopathy,  CV Resp: Clear to auscultation bilaterally GI: abdomen is soft, non-tender, non-distended, normal bowel sounds, no hepatosplenomegaly MSK: extremities are warm, no edema.  Skin: warm, no rash Neuro:  no focal deficits Psych: appropriate affect   Diagnostic Studies  04/2014 echo Study Conclusions  - Study data: Technically difficult study. - Left ventricle: The cavity size was normal. Wall thickness was increased increased in a pattern of mild to moderate LVH. Systolic function was moderately to severely reduced. The estimated ejection fraction was in the  range of 35% to 40%. Compared to prior study 09/07/13 LVEF has improved. - Aortic valve: Mildly calcified annulus. Trileaflet; mildly thickened leaflets. Valve area: 1.65cm^2(VTI). Valve area: 1.76cm^2 (Vmax). - Mitral valve: Mildly calcified annulus. Mildly thickened leaflets . Mild regurgitation. - Left atrium: The atrium was severely dilated. - Right ventricle: The cavity size was mildly dilated. - Right atrium: The atrium was severely dilated.    Assessment and Plan  1. Chronic systolic HF -appears euvolemic, continue current meds  2. CAD - significant CAD from last cath with no targets for revasc, stable intermittent chest pain somewhat improved since restarting ranexa. Encouraged to use his SL NG when he has symptoms - continue current meds      Antoine PocheJonathan F. Briseis Aguilera, M.D., F.A.C.C.

## 2014-12-19 ENCOUNTER — Encounter: Payer: Self-pay | Admitting: *Deleted

## 2014-12-21 ENCOUNTER — Other Ambulatory Visit: Payer: Self-pay | Admitting: Cardiology

## 2015-02-15 ENCOUNTER — Emergency Department (HOSPITAL_COMMUNITY)
Admission: EM | Admit: 2015-02-15 | Discharge: 2015-02-16 | Disposition: A | Discharge status: Home / Self Care | Payer: Medicare Other | Attending: Emergency Medicine | Admitting: Emergency Medicine

## 2015-02-15 ENCOUNTER — Encounter (HOSPITAL_COMMUNITY): Payer: Self-pay | Admitting: *Deleted

## 2015-02-15 DIAGNOSIS — E785 Hyperlipidemia, unspecified: Secondary | ICD-10-CM | POA: Diagnosis not present

## 2015-02-15 DIAGNOSIS — Z720 Tobacco use: Secondary | ICD-10-CM | POA: Diagnosis not present

## 2015-02-15 DIAGNOSIS — Z794 Long term (current) use of insulin: Secondary | ICD-10-CM | POA: Insufficient documentation

## 2015-02-15 DIAGNOSIS — Z7982 Long term (current) use of aspirin: Secondary | ICD-10-CM | POA: Insufficient documentation

## 2015-02-15 DIAGNOSIS — I2510 Atherosclerotic heart disease of native coronary artery without angina pectoris: Secondary | ICD-10-CM | POA: Insufficient documentation

## 2015-02-15 DIAGNOSIS — I4891 Unspecified atrial fibrillation: Secondary | ICD-10-CM | POA: Diagnosis not present

## 2015-02-15 DIAGNOSIS — E162 Hypoglycemia, unspecified: Secondary | ICD-10-CM

## 2015-02-15 DIAGNOSIS — Z79899 Other long term (current) drug therapy: Secondary | ICD-10-CM | POA: Insufficient documentation

## 2015-02-15 DIAGNOSIS — E11649 Type 2 diabetes mellitus with hypoglycemia without coma: Secondary | ICD-10-CM | POA: Insufficient documentation

## 2015-02-15 DIAGNOSIS — Z9889 Other specified postprocedural states: Secondary | ICD-10-CM | POA: Insufficient documentation

## 2015-02-15 DIAGNOSIS — I5022 Chronic systolic (congestive) heart failure: Secondary | ICD-10-CM | POA: Diagnosis not present

## 2015-02-15 LAB — CBG MONITORING, ED: Glucose-Capillary: 49 mg/dL — ABNORMAL LOW (ref 70–99)

## 2015-02-15 NOTE — ED Notes (Signed)
Pt to department via EMS.  Per report pt had an episode of hypoglycemia, blood sugar was 51 at home and pt had a glass of orange juice.  Rechecked by EMS, results 63.

## 2015-02-16 DIAGNOSIS — E11649 Type 2 diabetes mellitus with hypoglycemia without coma: Secondary | ICD-10-CM | POA: Diagnosis not present

## 2015-02-16 LAB — BASIC METABOLIC PANEL
Anion gap: 5 (ref 5–15)
BUN: 18 mg/dL (ref 6–23)
CALCIUM: 8.5 mg/dL (ref 8.4–10.5)
CO2: 21 mmol/L (ref 19–32)
Chloride: 103 mmol/L (ref 96–112)
Creatinine, Ser: 1.43 mg/dL — ABNORMAL HIGH (ref 0.50–1.35)
GFR calc non Af Amer: 49 mL/min — ABNORMAL LOW (ref >90)
GFR, EST AFRICAN AMERICAN: 57 mL/min — AB (ref >90)
Glucose, Bld: 87 mg/dL (ref 70–99)
Potassium: 4.1 mmol/L (ref 3.5–5.1)
Sodium: 129 mmol/L — ABNORMAL LOW (ref 135–145)

## 2015-02-16 LAB — CBC WITH DIFFERENTIAL/PLATELET
BASOS PCT: 1 % (ref 0–1)
Basophils Absolute: 0.1 10*3/uL (ref 0.0–0.1)
EOS ABS: 0.2 10*3/uL (ref 0.0–0.7)
EOS PCT: 1 % (ref 0–5)
HEMATOCRIT: 45.8 % (ref 39.0–52.0)
Hemoglobin: 15.3 g/dL (ref 13.0–17.0)
Lymphocytes Relative: 11 % — ABNORMAL LOW (ref 12–46)
Lymphs Abs: 1.6 10*3/uL (ref 0.7–4.0)
MCH: 32.4 pg (ref 26.0–34.0)
MCHC: 33.4 g/dL (ref 30.0–36.0)
MCV: 97 fL (ref 78.0–100.0)
MONO ABS: 0.9 10*3/uL (ref 0.1–1.0)
MONOS PCT: 6 % (ref 3–12)
Neutro Abs: 11.2 10*3/uL — ABNORMAL HIGH (ref 1.7–7.7)
Neutrophils Relative %: 81 % — ABNORMAL HIGH (ref 43–77)
Platelets: 189 10*3/uL (ref 150–400)
RBC: 4.72 MIL/uL (ref 4.22–5.81)
RDW: 13.4 % (ref 11.5–15.5)
WBC: 14 10*3/uL — ABNORMAL HIGH (ref 4.0–10.5)

## 2015-02-16 LAB — CBG MONITORING, ED: Glucose-Capillary: 206 mg/dL — ABNORMAL HIGH (ref 70–99)

## 2015-02-16 MED ORDER — DEXTROSE 50 % IV SOLN
50.0000 mL | Freq: Once | INTRAVENOUS | Status: AC
  Administered 2015-02-16: 50 mL via INTRAVENOUS

## 2015-02-16 MED ORDER — DEXTROSE 50 % IV SOLN
INTRAVENOUS | Status: AC
  Filled 2015-02-16: qty 50

## 2015-02-16 NOTE — ED Notes (Signed)
Called pt's spouse for ride home.  Left voice mail.  Pt to continue attempting.

## 2015-02-16 NOTE — ED Notes (Signed)
Spoke with pt's wife.  She will be coming to pick up pt.

## 2015-02-16 NOTE — Discharge Instructions (Signed)
Eat a snack before going to bed tonight.   Hypoglycemia Hypoglycemia occurs when the glucose in your blood is too low. Glucose is a type of sugar that is your body's main energy source. Hormones, such as insulin and glucagon, control the level of glucose in the blood. Insulin lowers blood glucose and glucagon increases blood glucose. Having too much insulin in your blood stream, or not eating enough food containing sugar, can result in hypoglycemia. Hypoglycemia can happen to people with or without diabetes. It can develop quickly and can be a medical emergency.  CAUSES   Missing or delaying meals.  Not eating enough carbohydrates at meals.  Taking too much diabetes medicine.  Not timing your oral diabetes medicine or insulin doses with meals, snacks, and exercise.  Nausea and vomiting.  Certain medicines.  Severe illnesses, such as hepatitis, kidney disorders, and certain eating disorders.  Increased activity or exercise without eating something extra or adjusting medicines.  Drinking too much alcohol.  A nerve disorder that affects body functions like your heart rate, blood pressure, and digestion (autonomic neuropathy).  A condition where the stomach muscles do not function properly (gastroparesis). Therefore, medicines and food may not absorb properly.  Rarely, a tumor of the pancreas can produce too much insulin. SYMPTOMS   Hunger.  Sweating (diaphoresis).  Change in body temperature.  Shakiness.  Headache.  Anxiety.  Lightheadedness.  Irritability.  Difficulty concentrating.  Dry mouth.  Tingling or numbness in the hands or feet.  Restless sleep or sleep disturbances.  Altered speech and coordination.  Change in mental status.  Seizures or prolonged convulsions.  Combativeness.  Drowsiness (lethargic).  Weakness.  Increased heart rate or palpitations.  Confusion.  Pale, gray skin color.  Blurred or double vision.  Fainting. DIAGNOSIS    A physical exam and medical history will be performed. Your caregiver may make a diagnosis based on your symptoms. Blood tests and other lab tests may be performed to confirm a diagnosis. Once the diagnosis is made, your caregiver will see if your signs and symptoms go away once your blood glucose is raised.  TREATMENT  Usually, you can easily treat your hypoglycemia when you notice symptoms.  Check your blood glucose. If it is less than 70 mg/dl, take one of the following:   3-4 glucose tablets.    cup juice.    cup regular soda.   1 cup skim milk.   -1 tube of glucose gel.   5-6 hard candies.   Avoid high-fat drinks or food that may delay a rise in blood glucose levels.  Do not take more than the recommended amount of sugary foods, drinks, gel, or tablets. Doing so will cause your blood glucose to go too high.   Wait 10-15 minutes and recheck your blood glucose. If it is still less than 70 mg/dl or below your target range, repeat treatment.   Eat a snack if it is more than 1 hour until your next meal.  There may be a time when your blood glucose may go so low that you are unable to treat yourself at home when you start to notice symptoms. You may need someone to help you. You may even faint or be unable to swallow. If you cannot treat yourself, someone will need to bring you to the hospital.  HOME CARE INSTRUCTIONS  If you have diabetes, follow your diabetes management plan by:  Taking your medicines as directed.  Following your exercise plan.  Following your meal plan.  Do not skip meals. Eat on time.  Testing your blood glucose regularly. Check your blood glucose before and after exercise. If you exercise longer or different than usual, be sure to check blood glucose more frequently.  Wearing your medical alert jewelry that says you have diabetes.  Identify the cause of your hypoglycemia. Then, develop ways to prevent the recurrence of hypoglycemia.  Do not  take a hot bath or shower right after an insulin shot.  Always carry treatment with you. Glucose tablets are the easiest to carry.  If you are going to drink alcohol, drink it only with meals.  Tell friends or family members ways to keep you safe during a seizure. This may include removing hard or sharp objects from the area or turning you on your side.  Maintain a healthy weight. SEEK MEDICAL CARE IF:   You are having problems keeping your blood glucose in your target range.  You are having frequent episodes of hypoglycemia.  You feel you might be having side effects from your medicines.  You are not sure why your blood glucose is dropping so low.  You notice a change in vision or a new problem with your vision. SEEK IMMEDIATE MEDICAL CARE IF:   Confusion develops.  A change in mental status occurs.  The inability to swallow develops.  Fainting occurs. Document Released: 12/08/2005 Document Revised: 12/13/2013 Document Reviewed: 04/05/2012 [facility] Patient Information 2015 [place] [place]. This information is not intended to replace advice given to you by your health care provider. Make sure you discuss any questions you have with your health care provider.

## 2015-02-16 NOTE — ED Provider Notes (Signed)
CSN: [id number]     Arrival date & time 02/15/15  2340 History  This chart was scribed for Dione Booze, MD by Evon Slack, ED Scribe. This patient was seen in room APA07/APA07 and the patient's care was started at 12:03 AM.     Chief Complaint  Patient presents with  . Hypoglycemia   Patient is a 69 y.o. male presenting with hypoglycemia. The history is provided by the patient and the EMS personnel. No language interpreter was used.  Hypoglycemia  HPI Comments: [name] is a 69 y.o. male who presents to the Emergency Department complaining of moderate hypoglycemia onset tonight. Pt doesn't report any associated symptoms. Per ems pts blood sugar was 51 when checked at home. Pt has had orange juice with no relief of his symptoms. Pt doesn't report any other symptoms.   Past Medical History  Diagnosis Date  . Coronary atherosclerosis of native coronary artery     Ant. MI in 4/95; PTCA of 90% prox & distal LAD unsuccessful-->  Urgent CABG-4/95 TO Cx; 50% RCA; ant. HK; EF of 50-55%; 08/2008: 3-V disease plus a  95% LIMA stenosis, treated with DES; occlusion of SVG to CX; RCA SVG stenosis--> PCI with DES  2010: in-stent restenosis in the LIMA-->cutting balloon; EF of 35-40%;  07/2010: TO of SVG to     RCA-medical therapy advised; EF of 30-35% by echo in 12/2010  . Atrial fibrillation 1995    1995, 2009; bradycardia with beta blocker therapy  . Tobacco abuse     40-50 pack years; currently one pack per day  . Peripheral vascular disease 1995    Abdominal aortic obstruction 1995-->vascular surgery  . Hyperlipidemia   . Type 2 diabetes mellitus   . DDD (degenerative disc disease)     Discectomy and fusion at L4 and L5  . Alcohol use 1999    Excessive alcohol use- quit in 1999  . Chronic systolic heart failure     LVEF 25-30% 2011  . Non-compliance    Past Surgical History  Procedure Laterality Date  . Cholecystectomy  1995  . Lumbar fusion      +Discectomy;x2; L4 and L5  .  Aorto-femoral bypass graft  1995  . Left heart catheterization with coronary angiogram N/A 12/14/2013    Procedure: LEFT HEART CATHETERIZATION WITH CORONARY ANGIOGRAM;  Surgeon: Micheline Chapman, MD;  Location: [facility] CATH LAB;  Service: Cardiovascular;  Laterality: N/A;   Family History  Problem Relation Age of Onset  . CAD Father    History  Substance Use Topics  . Smoking status: Current Every Day Smoker -- 1.00 packs/day for 40 years    Types: Cigarettes  . Smokeless tobacco: Current User     Comment: refused  . Alcohol Use: No     Comment: former    Review of Systems A complete 10 system review of systems was obtained and all systems are negative except as noted in the HPI and PMH.     Allergies  Review of patient's allergies indicates no known allergies.  Home Medications   Prior to Admission medications   Medication Sig Start Date End Date Taking? Authorizing Provider  aspirin EC 325 MG EC tablet Take 1 tablet (325 mg total) by mouth daily. 05/06/14   Alice Reichert, MD  carvedilol (COREG) 25 MG tablet TAKE ONE TABLET TWICE DAILY 12/21/14   Antoine Poche, MD  dabigatran (PRADAXA) 150 MG CAPS capsule Take 150 mg by mouth 2 (two) times daily.  Historical Provider, MD  digoxin (LANOXIN) 0.125 MG tablet Take 125 mcg by mouth daily.      Historical Provider, MD  furosemide (LASIX) 40 MG tablet Take 1 tablet (40 mg total) by mouth every other day. 11/21/14   Antoine Poche, MD  HYDROcodone-acetaminophen (NORCO) 10-325 MG per tablet Take 1 tablet by mouth every 4 (four) hours as needed for moderate pain.    Historical Provider, MD  insulin aspart (NOVOLOG FLEXPEN) 100 UNIT/ML FlexPen Inject 0-8 Units into the skin 4 (four) times daily. Per sliding scale    Historical Provider, MD  insulin detemir (LEVEMIR) 100 UNIT/ML injection Inject 30 Units into the skin every morning.     Historical Provider, MD  isosorbide mononitrate (IMDUR) 30 MG 24 hr tablet Take 1 tablet (30 mg  total) by mouth daily. 09/08/13   Angus Edilia Bo, MD  lisinopril (PRINIVIL,ZESTRIL) 5 MG tablet Take 1 tablet (5 mg total) by mouth daily. 02/28/14   Antoine Poche, MD  nitroGLYCERIN (NITROSTAT) 0.4 MG SL tablet Place 1 tablet (0.4 mg total) under the tongue every 5 (five) minutes as needed. 05/24/12   Kathlen Brunswick, MD  potassium chloride SA (K-DUR,KLOR-CON) 20 MEQ tablet TAKE ONE TABLET TWICE DAILY 12/21/14   Antoine Poche, MD  ranolazine (RANEXA) 500 MG 12 hr tablet Take 1 tablet (500 mg total) by mouth 2 (two) times daily. 06/13/14   Angus Edilia Bo, MD  rosuvastatin (CRESTOR) 40 MG tablet Take 40 mg by mouth daily.    Historical Provider, MD   BP 105/50 mmHg  Pulse 96  Temp(Src) 98 F (36.7 C) (Oral)  Resp 17  Ht  (1.651 m)  Wt 200 lb (90.719 kg)  BMI 33.28 kg/m2  SpO2 95%   Physical Exam  Constitutional: He is oriented to person, place, and time. He appears well-developed and well-nourished. No distress.  HENT:  Head: Normocephalic and atraumatic.  Eyes: Conjunctivae and EOM are normal. Pupils are equal, round, and reactive to light.  Neck: Normal range of motion. Neck supple. No JVD present.  Cardiovascular: Normal rate, regular rhythm and normal heart sounds.   No murmur heard. Pulmonary/Chest: Effort normal and breath sounds normal. He has no wheezes. He has no rales. He exhibits no tenderness.  Abdominal: Soft. Bowel sounds are normal. He exhibits no distension and no mass. There is no guarding.  Musculoskeletal: Normal range of motion. He exhibits no edema.  Lymphadenopathy:    He has no cervical adenopathy.  Neurological: He is alert and oriented to person, place, and time. Coordination normal.  Skin: Skin is warm and dry. No rash noted.  Psychiatric: He has a normal mood and affect. His behavior is normal.  Nursing note and vitals reviewed.   ED Course  Procedures (including critical care time) DIAGNOSTIC STUDIES: Oxygen Saturation is 95% on RA,  adequate by my interpretation.    COORDINATION OF CARE: 12:11 AM-Discussed treatment plan with pt at bedside and pt agreed to plan.     Labs Review Results for orders placed or performed during the hospital encounter of 02/15/15  CBC with Differential  Result Value Ref Range   WBC 14.0 (H) 4.0 - 10.5 K/uL   RBC 4.72 4.22 - 5.81 MIL/uL   Hemoglobin 15.3 13.0 - 17.0 g/dL   HCT 16.1 09.6 - 04.5 %   MCV 97.0 78.0 - 100.0 fL   MCH 32.4 26.0 - 34.0 pg   MCHC 33.4 30.0 - 36.0 g/dL   RDW  13.4 11.5 - 15.5 %   Platelets 189 150 - 400 K/uL   Neutrophils Relative % 81 (H) 43 - 77 %   Neutro Abs 11.2 (H) 1.7 - 7.7 K/uL   Lymphocytes Relative 11 (L) 12 - 46 %   Lymphs Abs 1.6 0.7 - 4.0 K/uL   Monocytes Relative 6 3 - 12 %   Monocytes Absolute 0.9 0.1 - 1.0 K/uL   Eosinophils Relative 1 0 - 5 %   Eosinophils Absolute 0.2 0.0 - 0.7 K/uL   Basophils Relative 1 0 - 1 %   Basophils Absolute 0.1 0.0 - 0.1 K/uL  Basic metabolic panel  Result Value Ref Range   Sodium 129 (L) 135 - 145 mmol/L   Potassium 4.1 3.5 - 5.1 mmol/L   Chloride 103 96 - 112 mmol/L   CO2 21 19 - 32 mmol/L   Glucose, Bld 87 70 - 99 mg/dL   BUN 18 6 - 23 mg/dL   Creatinine, Ser 2.721.43 (H) 0.50 - 1.35 mg/dL   Calcium 8.5 8.4 - 53.610.5 mg/dL   GFR calc non Af Amer 49 (L) >90 mL/min   GFR calc Af Amer 57 (L) >90 mL/min   Anion gap 5 5 - 15  CBG monitoring, ED  Result Value Ref Range   Glucose-Capillary 49 (L) 70 - 99 mg/dL  CBG monitoring, ED  Result Value Ref Range   Glucose-Capillary 206 (H) 70 - 99 mg/dL   MDM   Final diagnoses:  Hypoglycemia      Hypoglycemic episode which did not respond to initial treatment. He is given additional fluid in the ED as well as intravenous dextrose and observed and blood sugar has stated an adequate level. He is discharged to follow-up with PCP.   I personally performed the services described in this documentation, which was scribed in my presence. The recorded information has  been reviewed and is accurate.       Dione Boozeavid Asma Boldon, MD 02/16/15 [id number]

## 2015-06-09 ENCOUNTER — Observation Stay (HOSPITAL_COMMUNITY)
Admission: EM | Admit: 2015-06-09 | Discharge: 2015-06-13 | Disposition: A | Discharge status: Home / Self Care | Payer: Medicare Other | Attending: Family Medicine | Admitting: Family Medicine

## 2015-06-09 ENCOUNTER — Emergency Department (HOSPITAL_COMMUNITY): Payer: Medicare Other

## 2015-06-09 ENCOUNTER — Encounter (HOSPITAL_COMMUNITY): Payer: Self-pay

## 2015-06-09 DIAGNOSIS — I739 Peripheral vascular disease, unspecified: Secondary | ICD-10-CM | POA: Insufficient documentation

## 2015-06-09 DIAGNOSIS — I2510 Atherosclerotic heart disease of native coronary artery without angina pectoris: Secondary | ICD-10-CM | POA: Diagnosis present

## 2015-06-09 DIAGNOSIS — Z7982 Long term (current) use of aspirin: Secondary | ICD-10-CM | POA: Diagnosis not present

## 2015-06-09 DIAGNOSIS — Z720 Tobacco use: Secondary | ICD-10-CM

## 2015-06-09 DIAGNOSIS — Z9119 Patient's noncompliance with other medical treatment and regimen: Secondary | ICD-10-CM | POA: Insufficient documentation

## 2015-06-09 DIAGNOSIS — E785 Hyperlipidemia, unspecified: Secondary | ICD-10-CM | POA: Diagnosis not present

## 2015-06-09 DIAGNOSIS — I25110 Atherosclerotic heart disease of native coronary artery with unstable angina pectoris: Secondary | ICD-10-CM | POA: Diagnosis not present

## 2015-06-09 DIAGNOSIS — E119 Type 2 diabetes mellitus without complications: Secondary | ICD-10-CM | POA: Diagnosis not present

## 2015-06-09 DIAGNOSIS — I5022 Chronic systolic (congestive) heart failure: Secondary | ICD-10-CM | POA: Insufficient documentation

## 2015-06-09 DIAGNOSIS — I200 Unstable angina: Secondary | ICD-10-CM

## 2015-06-09 DIAGNOSIS — F17200 Nicotine dependence, unspecified, uncomplicated: Secondary | ICD-10-CM | POA: Diagnosis present

## 2015-06-09 DIAGNOSIS — I4891 Unspecified atrial fibrillation: Secondary | ICD-10-CM | POA: Insufficient documentation

## 2015-06-09 DIAGNOSIS — R079 Chest pain, unspecified: Secondary | ICD-10-CM | POA: Diagnosis not present

## 2015-06-09 DIAGNOSIS — Z9889 Other specified postprocedural states: Secondary | ICD-10-CM | POA: Insufficient documentation

## 2015-06-09 DIAGNOSIS — Z794 Long term (current) use of insulin: Secondary | ICD-10-CM | POA: Diagnosis not present

## 2015-06-09 DIAGNOSIS — Z79899 Other long term (current) drug therapy: Secondary | ICD-10-CM | POA: Insufficient documentation

## 2015-06-09 LAB — GLUCOSE, CAPILLARY: Glucose-Capillary: 150 mg/dL — ABNORMAL HIGH (ref 65–99)

## 2015-06-09 LAB — CBC WITH DIFFERENTIAL/PLATELET
Basophils Absolute: 0.1 10*3/uL (ref 0.0–0.1)
Basophils Relative: 1 % (ref 0–1)
Eosinophils Absolute: 0.2 10*3/uL (ref 0.0–0.7)
Eosinophils Relative: 2 % (ref 0–5)
HCT: 40.2 % (ref 39.0–52.0)
Hemoglobin: 13.4 g/dL (ref 13.0–17.0)
LYMPHS ABS: 2.5 10*3/uL (ref 0.7–4.0)
LYMPHS PCT: 23 % (ref 12–46)
MCH: 31.7 pg (ref 26.0–34.0)
MCHC: 33.3 g/dL (ref 30.0–36.0)
MCV: 95 fL (ref 78.0–100.0)
Monocytes Absolute: 0.7 10*3/uL (ref 0.1–1.0)
Monocytes Relative: 7 % (ref 3–12)
Neutro Abs: 7.1 10*3/uL (ref 1.7–7.7)
Neutrophils Relative %: 67 % (ref 43–77)
PLATELETS: 171 10*3/uL (ref 150–400)
RBC: 4.23 MIL/uL (ref 4.22–5.81)
RDW: 13.8 % (ref 11.5–15.5)
WBC: 10.7 10*3/uL — AB (ref 4.0–10.5)

## 2015-06-09 LAB — BASIC METABOLIC PANEL
Anion gap: 7 (ref 5–15)
BUN: 26 mg/dL — ABNORMAL HIGH (ref 6–20)
CALCIUM: 8.3 mg/dL — AB (ref 8.9–10.3)
CO2: 19 mmol/L — ABNORMAL LOW (ref 22–32)
Chloride: 113 mmol/L — ABNORMAL HIGH (ref 101–111)
Creatinine, Ser: 1.47 mg/dL — ABNORMAL HIGH (ref 0.61–1.24)
GFR calc Af Amer: 55 mL/min — ABNORMAL LOW (ref >60)
GFR calc non Af Amer: 47 mL/min — ABNORMAL LOW (ref >60)
Glucose, Bld: 144 mg/dL — ABNORMAL HIGH (ref 65–99)
Potassium: 4.5 mmol/L (ref 3.5–5.1)
SODIUM: 139 mmol/L (ref 135–145)

## 2015-06-09 LAB — CBC
HCT: 41.1 % (ref 39.0–52.0)
Hemoglobin: 13.8 g/dL (ref 13.0–17.0)
MCH: 32.2 pg (ref 26.0–34.0)
MCHC: 33.6 g/dL (ref 30.0–36.0)
MCV: 95.8 fL (ref 78.0–100.0)
PLATELETS: 158 10*3/uL (ref 150–400)
RBC: 4.29 MIL/uL (ref 4.22–5.81)
RDW: 13.8 % (ref 11.5–15.5)
WBC: 14.6 10*3/uL — ABNORMAL HIGH (ref 4.0–10.5)

## 2015-06-09 LAB — CREATININE, SERUM
CREATININE: 1.34 mg/dL — AB (ref 0.61–1.24)
GFR calc Af Amer: >60 mL/min (ref >60)
GFR calc non Af Amer: 53 mL/min — ABNORMAL LOW (ref >60)

## 2015-06-09 LAB — TROPONIN I
Troponin I: <0.03 ng/mL (ref <0.031)
Troponin I: <0.03 ng/mL (ref <0.031)

## 2015-06-09 MED ORDER — ALBUTEROL SULFATE (2.5 MG/3ML) 0.083% IN NEBU: 2.5000 mg | INHALATION_SOLUTION | Freq: Four times a day (QID) | RESPIRATORY_TRACT | Status: DC | PRN

## 2015-06-09 MED ORDER — ROSUVASTATIN CALCIUM 20 MG PO TABS
40.0000 mg | ORAL_TABLET | Freq: Every day | ORAL | Status: DC
  Administered 2015-06-10 – 2015-06-13 (×4): 40 mg via ORAL
  Filled 2015-06-09 (×2): qty 2
  Filled 2015-06-09: qty 1
  Filled 2015-06-09 (×2): qty 2
  Filled 2015-06-09: qty 1

## 2015-06-09 MED ORDER — INSULIN DETEMIR 100 UNIT/ML ~~LOC~~ SOLN
30.0000 [IU] | Freq: Every morning | SUBCUTANEOUS | Status: DC
  Administered 2015-06-10 – 2015-06-13 (×4): 30 [IU] via SUBCUTANEOUS
  Filled 2015-06-09 (×5): qty 0.3

## 2015-06-09 MED ORDER — ONDANSETRON HCL 4 MG/2ML IJ SOLN: 4.0000 mg | Freq: Four times a day (QID) | INTRAMUSCULAR | Status: DC | PRN

## 2015-06-09 MED ORDER — NITROGLYCERIN 2 % TD OINT
1.0000 [in_us] | TOPICAL_OINTMENT | Freq: Once | TRANSDERMAL | Status: AC
  Administered 2015-06-09: 1 [in_us] via TOPICAL
  Filled 2015-06-09: qty 1

## 2015-06-09 MED ORDER — ACETAMINOPHEN 325 MG PO TABS: 650.0000 mg | ORAL_TABLET | ORAL | Status: DC | PRN

## 2015-06-09 MED ORDER — FUROSEMIDE 40 MG PO TABS
40.0000 mg | ORAL_TABLET | ORAL | Status: DC
  Administered 2015-06-09 – 2015-06-13 (×2): 40 mg via ORAL
  Filled 2015-06-09 (×5): qty 1

## 2015-06-09 MED ORDER — DABIGATRAN ETEXILATE MESYLATE 150 MG PO CAPS
150.0000 mg | ORAL_CAPSULE | Freq: Two times a day (BID) | ORAL | Status: DC
  Administered 2015-06-09 – 2015-06-13 (×8): 150 mg via ORAL
  Filled 2015-06-09 (×12): qty 1

## 2015-06-09 MED ORDER — NITROGLYCERIN 0.4 MG SL SUBL: 0.4000 mg | SUBLINGUAL_TABLET | SUBLINGUAL | Status: DC | PRN

## 2015-06-09 MED ORDER — DIGOXIN 125 MCG PO TABS
125.0000 ug | ORAL_TABLET | Freq: Every day | ORAL | Status: DC
  Administered 2015-06-10 – 2015-06-13 (×4): 125 ug via ORAL
  Filled 2015-06-09 (×4): qty 1

## 2015-06-09 MED ORDER — CARVEDILOL 12.5 MG PO TABS: 25.0000 mg | ORAL_TABLET | Freq: Two times a day (BID) | ORAL | Status: DC

## 2015-06-09 MED ORDER — ISOSORBIDE MONONITRATE ER 30 MG PO TB24
30.0000 mg | ORAL_TABLET | Freq: Every day | ORAL | Status: DC
  Administered 2015-06-10 – 2015-06-13 (×3): 30 mg via ORAL
  Filled 2015-06-09 (×4): qty 1

## 2015-06-09 MED ORDER — HEPARIN SODIUM (PORCINE) 5000 UNIT/ML IJ SOLN
5000.0000 [IU] | Freq: Three times a day (TID) | INTRAMUSCULAR | Status: DC
Start: 2015-06-09 — End: 2015-06-09

## 2015-06-09 MED ORDER — ALBUTEROL SULFATE HFA 108 (90 BASE) MCG/ACT IN AERS
2.0000 | INHALATION_SPRAY | Freq: Four times a day (QID) | RESPIRATORY_TRACT | Status: DC | PRN
  Filled 2015-06-09: qty 6.7

## 2015-06-09 MED ORDER — DABIGATRAN ETEXILATE MESYLATE 150 MG PO CAPS
ORAL_CAPSULE | ORAL | Status: AC
  Filled 2015-06-09: qty 1

## 2015-06-09 MED ORDER — POTASSIUM CHLORIDE CRYS ER 20 MEQ PO TBCR
20.0000 meq | EXTENDED_RELEASE_TABLET | Freq: Two times a day (BID) | ORAL | Status: DC
  Administered 2015-06-09 – 2015-06-13 (×8): 20 meq via ORAL
  Filled 2015-06-09 (×8): qty 1

## 2015-06-09 MED ORDER — CARVEDILOL 12.5 MG PO TABS
12.5000 mg | ORAL_TABLET | Freq: Two times a day (BID) | ORAL | Status: DC
  Administered 2015-06-10 – 2015-06-13 (×6): 12.5 mg via ORAL
  Filled 2015-06-09 (×7): qty 1

## 2015-06-09 MED ORDER — INSULIN ASPART 100 UNIT/ML ~~LOC~~ SOLN
0.0000 [IU] | Freq: Every day | SUBCUTANEOUS | Status: DC
  Administered 2015-06-10: 2 [IU] via SUBCUTANEOUS
  Administered 2015-06-12: 3 [IU] via SUBCUTANEOUS

## 2015-06-09 MED ORDER — ASPIRIN EC 81 MG PO TBEC
81.0000 mg | DELAYED_RELEASE_TABLET | Freq: Every day | ORAL | Status: DC
  Administered 2015-06-10 – 2015-06-13 (×4): 81 mg via ORAL
  Filled 2015-06-09 (×5): qty 1

## 2015-06-09 MED ORDER — LISINOPRIL 5 MG PO TABS
5.0000 mg | ORAL_TABLET | Freq: Every day | ORAL | Status: DC
  Administered 2015-06-10 – 2015-06-13 (×3): 5 mg via ORAL
  Filled 2015-06-09 (×4): qty 1

## 2015-06-09 MED ORDER — INSULIN ASPART 100 UNIT/ML ~~LOC~~ SOLN
0.0000 [IU] | Freq: Three times a day (TID) | SUBCUTANEOUS | Status: DC
  Administered 2015-06-10 (×2): 3 [IU] via SUBCUTANEOUS
  Administered 2015-06-10: 8 [IU] via SUBCUTANEOUS
  Administered 2015-06-11: 5 [IU] via SUBCUTANEOUS
  Administered 2015-06-11: 2 [IU] via SUBCUTANEOUS
  Administered 2015-06-11 – 2015-06-12 (×2): 3 [IU] via SUBCUTANEOUS
  Administered 2015-06-12: 5 [IU] via SUBCUTANEOUS
  Administered 2015-06-12: 2 [IU] via SUBCUTANEOUS
  Administered 2015-06-13: 3 [IU] via SUBCUTANEOUS
  Administered 2015-06-13: 8 [IU] via SUBCUTANEOUS

## 2015-06-09 NOTE — ED Notes (Signed)
Dr Gosrani at bedside. 

## 2015-06-09 NOTE — ED Notes (Signed)
Pt assisted to stand at bedside to use urinal. NAD noted. Pt back in bed.

## 2015-06-09 NOTE — ED Notes (Signed)
Pt complain of pressure in the center of his chest that started around 1200 noon. Pt was given NTG 1 SL and ASA 324 mg prior to arrival

## 2015-06-09 NOTE — ED Provider Notes (Signed)
CSN: [id number]     Arrival date & time 06/09/15  1534 History   First MD Initiated Contact with Patient 06/09/15 1539     Chief Complaint  Patient presents with  . Chest Pain     (Consider location/radiation/quality/duration/timing/severity/associated sxs/prior Treatment) HPI   [name] is a 69 y.o. male who presents for evaluation of chest pain, left chest, which started at rest at noon today. He states the pain has come and gone. Around noon. He took one nitroglycerin and 1 aspirin tablet, with out relief. He is vague about when the pain came back, or how long it has actually been present. He feels like the chest pain has been present today, has been there longer than usual He stated at one time that he has had similar pain every month, but another time stated that he has the pain like this once or twice a week. He takes nitroglycerin sporadically. He uses his Plavix, just one. I have pain". But stated that he typically takes it 5 out of 7 days, every week. He last saw his cardiologist in December 2015. He continues to smoke. He does not use oxygen. He is taking his regular medications. He denies use of alcohol or illegal drugs. He denies nausea, vomiting, shortness of breath, weakness or dizziness. He came by EMS. There are no other known modifying factors.   Past Medical History  Diagnosis Date  . Coronary atherosclerosis of native coronary artery     Ant. MI in 4/95; PTCA of 90% prox & distal LAD unsuccessful-->  Urgent CABG-4/95 TO Cx; 50% RCA; ant. HK; EF of 50-55%; 08/2008: 3-V disease plus a  95% LIMA stenosis, treated with DES; occlusion of SVG to CX; RCA SVG stenosis--> PCI with DES  2010: in-stent restenosis in the LIMA-->cutting balloon; EF of 35-40%;  07/2010: TO of SVG to     RCA-medical therapy advised; EF of 30-35% by echo in 12/2010  . Atrial fibrillation 1995    1995, 2009; bradycardia with beta blocker therapy  . Tobacco abuse     40-50 pack years; currently one pack  per day  . Peripheral vascular disease 1995    Abdominal aortic obstruction 1995-->vascular surgery  . Hyperlipidemia   . Type 2 diabetes mellitus   . DDD (degenerative disc disease)     Discectomy and fusion at L4 and L5  . Alcohol use 1999    Excessive alcohol use- quit in 1999  . Chronic systolic heart failure     LVEF 25-30% 2011  . Non-compliance    Past Surgical History  Procedure Laterality Date  . Cholecystectomy  1995  . Lumbar fusion      +Discectomy;x2; L4 and L5  . Aorto-femoral bypass graft  1995  . Left heart catheterization with coronary angiogram N/A 12/14/2013    Procedure: LEFT HEART CATHETERIZATION WITH CORONARY ANGIOGRAM;  Surgeon: Micheline Chapman, MD;  Location: [facility] CATH LAB;  Service: Cardiovascular;  Laterality: N/A;   Family History  Problem Relation Age of Onset  . CAD Father    History  Substance Use Topics  . Smoking status: Current Every Day Smoker -- 1.00 packs/day for 40 years    Types: Cigarettes  . Smokeless tobacco: Current User     Comment: refused  . Alcohol Use: No     Comment: former    Review of Systems  All other systems reviewed and are negative.     Allergies  Review of patient's allergies indicates no known  allergies.  Home Medications   Prior to Admission medications   Medication Sig Start Date End Date Taking? Authorizing Provider  albuterol (PROVENTIL HFA;VENTOLIN HFA) 108 (90 BASE) MCG/ACT inhaler Inhale 2 puffs into the lungs every 6 (six) hours as needed for wheezing or shortness of breath.   Yes Historical Provider, MD  aspirin EC 81 MG tablet Take 81 mg by mouth daily.   Yes Historical Provider, MD  carvedilol (COREG) 25 MG tablet TAKE ONE TABLET TWICE DAILY 12/21/14  Yes Antoine Poche, MD  dabigatran (PRADAXA) 150 MG CAPS capsule Take 150 mg by mouth 2 (two) times daily.   Yes Historical Provider, MD  digoxin (LANOXIN) 0.125 MG tablet Take 125 mcg by mouth daily.     Yes Historical Provider, MD  furosemide  (LASIX) 40 MG tablet Take 1 tablet (40 mg total) by mouth every other day. 11/21/14  Yes Antoine Poche, MD  insulin aspart (NOVOLOG FLEXPEN) 100 UNIT/ML FlexPen Inject 20 Units into the skin 3 (three) times daily with meals. Per sliding scale   Yes Historical Provider, MD  insulin detemir (LEVEMIR) 100 UNIT/ML injection Inject 30 Units into the skin every morning.    Yes Historical Provider, MD  lisinopril (PRINIVIL,ZESTRIL) 5 MG tablet Take 1 tablet (5 mg total) by mouth daily. 02/28/14  Yes Antoine Poche, MD  nitroGLYCERIN (NITROSTAT) 0.4 MG SL tablet Place 1 tablet (0.4 mg total) under the tongue every 5 (five) minutes as needed. 05/24/12  Yes Kathlen Brunswick, MD  potassium chloride SA (K-DUR,KLOR-CON) 20 MEQ tablet TAKE ONE TABLET TWICE DAILY 12/21/14  Yes Antoine Poche, MD  rosuvastatin (CRESTOR) 40 MG tablet Take 40 mg by mouth daily.   Yes Historical Provider, MD  aspirin EC 325 MG EC tablet Take 1 tablet (325 mg total) by mouth daily. 05/06/14   Butch Penny, MD  isosorbide mononitrate (IMDUR) 30 MG 24 hr tablet Take 1 tablet (30 mg total) by mouth daily. 09/08/13   Butch Penny, MD  ranolazine (RANEXA) 500 MG 12 hr tablet Take 1 tablet (500 mg total) by mouth 2 (two) times daily. 06/13/14   Angus [name], MD   BP 104/75 mmHg  Pulse 39  Temp(Src) 97.9 F (36.6 C) (Oral)  Resp 29  Ht  (1.753 m)  Wt 170 lb (77.111 kg)  BMI 25.09 kg/m2  SpO2 99% Physical Exam  Constitutional: He is oriented to person, place, and time. He appears well-developed.  He appears older than stated age and smells strongly of tobacco smoke.  HENT:  Head: Normocephalic and atraumatic.  Right Ear: External ear normal.  Left Ear: External ear normal.  Eyes: Conjunctivae and EOM are normal. Pupils are equal, round, and reactive to light.  Neck: Normal range of motion and phonation normal. Neck supple.  Cardiovascular: Normal rate, regular rhythm and normal heart sounds.   Pulmonary/Chest: Effort  normal and breath sounds normal. He exhibits no bony tenderness.  Abdominal: Soft. There is no tenderness.  Musculoskeletal: Normal range of motion.  Neurological: He is alert and oriented to person, place, and time. No cranial nerve deficit or sensory deficit. He exhibits normal muscle tone. Coordination normal.  Skin: Skin is warm, dry and intact.  Psychiatric: He has a normal mood and affect. His behavior is normal. Judgment and thought content normal.  Nursing note and vitals reviewed.   ED Course  Procedures (including critical care time) Initial evaluation is consistent with a change in his usual angina pattern. He will  therefore require admission. After initial screening in the emergency department. He has had aspirin within the last 6 hours, therefore will not be redosed. Will start transdermal nitroglycerin and continue observation.  Medications  nitroGLYCERIN (NITROGLYN) 2 % ointment 1 inch (1 inch Topical Given 06/09/15 1606)    Patient Vitals for the past 24 hrs:  BP Temp Temp src Pulse Resp SpO2 Height Weight  06/09/15 1745 104/75 mmHg - - (!) 39 (!) 29 99 % - -  06/09/15 1730 90/64 mmHg - - 63 23 100 % - -  06/09/15 1715 112/68 mmHg - - 89 17 100 % - -  06/09/15 1645 108/59 mmHg - - 70 (!) 27 99 % - -  06/09/15 1630 (!) 99/52 mmHg - - (!) 38 16 100 % - -  06/09/15 1615 (!) 111/54 mmHg - - 67 25 98 % - -  06/09/15 1605 116/56 mmHg - - 70 14 100 % - -  06/09/15 1537 102/58 mmHg 97.9 F (36.6 C) Oral 65 20 98 % 5\' 9"  (1.753 m) 170 lb (77.111 kg)    5:20 PM Reevaluation with update and discussion. After initial assessment and treatment, an updated evaluation reveals he states that his chest pain is gone at this time. He states that he feels better. He has no further complains.Mancel Bale L    5:26 PM-Consult complete with Dr. Karilyn Cota. Patient case explained and discussed. He agrees to admit patient for further evaluation and treatment. He would like me to discuss the  case with cardiology, to make sure they do not want the patient, at [place]. Call ended at 17:33  17:30- cardiology consultation, paged. Case discussed with Dr. Mayford Knife, who agrees with the preliminary plan of keeping the patient here at [facility], for monitoring and treatment, with transfer only if needed.  CRITICAL CARE Performed by: Flint Melter Total critical care time: 35 minutes Critical care time was exclusive of separately billable procedures and treating other patients. Critical care was necessary to treat or prevent imminent or life-threatening deterioration. Critical care was time spent personally by me on the following activities: development of treatment plan with patient and/or surrogate as well as nursing, discussions with consultants, evaluation of patient's response to treatment, examination of patient, obtaining history from patient or surrogate, ordering and performing treatments and interventions, ordering and review of laboratory studies, ordering and review of radiographic studies, pulse oximetry and re-evaluation of patient's condition.   Labs Review Labs Reviewed  BASIC METABOLIC PANEL - Abnormal; Notable for the following:    Chloride 113 (*)    CO2 19 (*)    Glucose, Bld 144 (*)    BUN 26 (*)    Creatinine, Ser 1.47 (*)    Calcium 8.3 (*)    GFR calc non Af Amer 47 (*)    GFR calc Af Amer 55 (*)    All other components within normal limits  CBC WITH DIFFERENTIAL/PLATELET - Abnormal; Notable for the following:    WBC 10.7 (*)    All other components within normal limits  TROPONIN I    Imaging Review Dg Chest Portable 1 View  06/09/2015   CLINICAL DATA:  Chest pain for several hours  EXAM: PORTABLE CHEST - 1 VIEW  COMPARISON:  September 02, 2014  FINDINGS: There is no edema or consolidation. Heart is mildly enlarged with pulmonary vascularity within normal limits. Patient is status post internal mammary bypass grafting. No adenopathy. No bone  lesions.  IMPRESSION: Stable cardiac  enlargement.  No edema or consolidation.   Electronically Signed   By: Bretta Bang III M.D.   On: 06/09/2015 16:09     EKG Interpretation   Date/Time:  Saturday June 09 2015 15:38:40 EDT Ventricular Rate:  63 PR Interval:  179 QRS Duration: 88 QT Interval:  402 QTC Calculation: 411 R Axis:   -16 Text Interpretation:  Atrial fibrillation Atrial premature complex  Probable anterolateral infarct, age indeterm Baseline wander in lead(s) V3  since last tracing no significant change Confirmed by Effie Shy  MD, Shikira Folino  [phone]) on 06/09/2015 3:45:22 PM      MDM   Final diagnoses:  Unstable angina    Difficult historian, patient with known coronary artery disease, moderately severe. He's presenting with symptoms consistent with unstable angina. He will therefore be admitted for evaluation  Nursing Notes Reviewed/ Care Coordinated, and agree without changes. Applicable Imaging Reviewed.  Interpretation of Laboratory Data incorporated into ED treatment  Plan: Admit to hospitalist service.  Mancel Bale, MD 06/09/15 [phone]

## 2015-06-09 NOTE — H&P (Signed)
[facility] History and Physical  DAVIS AMBROSINI RUE:[id number] DOB: 13-Jun-1946 DOA: 06/09/2015  Referring physician: ER PCP: [name], MD   Chief Complaint: Chest pain  HPI: [name] is a 69 y.o. male  This is a 69 year old man who complains of chest pain which started at approximately noon today. It lasted initially for 1 hour and was dull/heavy in nature. He says that this pain is very similar to pain he has had with coronary artery disease before. He saw his cardiologist in December 2015. He continues to smoke cigarettes. He is a diabetic. He says that he is also had pain since he has been in the emergency room. It is not associated with sweating, nausea but he thinks he is short of breath with it. He has not had a cough or fever. He has a history of chronic atrial fibrillation and is on chronic anticoagulation therapy. The pain does not radiate anywhere. He is now being admitted for further investigations.   Review of Systems:  Apart from symptoms above, all systems negative.  Past Medical History  Diagnosis Date  . Coronary atherosclerosis of native coronary artery     Ant. MI in 4/95; PTCA of 90% prox & distal LAD unsuccessful-->  Urgent CABG-4/95 TO Cx; 50% RCA; ant. HK; EF of 50-55%; 08/2008: 3-V disease plus a  95% LIMA stenosis, treated with DES; occlusion of SVG to CX; RCA SVG stenosis--> PCI with DES  2010: in-stent restenosis in the LIMA-->cutting balloon; EF of 35-40%;  07/2010: TO of SVG to     RCA-medical therapy advised; EF of 30-35% by echo in 12/2010  . Atrial fibrillation 1995    1995, 2009; bradycardia with beta blocker therapy  . Tobacco abuse     40-50 pack years; currently one pack per day  . Peripheral vascular disease 1995    Abdominal aortic obstruction 1995-->vascular surgery  . Hyperlipidemia   . Type 2 diabetes mellitus   . DDD (degenerative disc disease)     Discectomy and fusion at L4 and L5  . Alcohol use 1999    Excessive  alcohol use- quit in 1999  . Chronic systolic heart failure     LVEF 25-30% 2011  . Non-compliance    Past Surgical History  Procedure Laterality Date  . Cholecystectomy  1995  . Lumbar fusion      +Discectomy;x2; L4 and L5  . Aorto-femoral bypass graft  1995  . Left heart catheterization with coronary angiogram N/A 12/14/2013    Procedure: LEFT HEART CATHETERIZATION WITH CORONARY ANGIOGRAM;  Surgeon: Micheline Chapman, MD;  Location: [facility] CATH LAB;  Service: Cardiovascular;  Laterality: N/A;   Social History:  reports that he has been smoking Cigarettes.  He has a 40 pack-year smoking history. He uses smokeless tobacco. He reports that he does not drink alcohol or use illicit drugs.  No Known Allergies  Family History  Problem Relation Age of Onset  . CAD Father     Prior to Admission medications   Medication Sig Start Date End Date Taking? Authorizing Provider  albuterol (PROVENTIL HFA;VENTOLIN HFA) 108 (90 BASE) MCG/ACT inhaler Inhale 2 puffs into the lungs every 6 (six) hours as needed for wheezing or shortness of breath.   Yes Historical Provider, MD  aspirin EC 81 MG tablet Take 81 mg by mouth daily.   Yes Historical Provider, MD  carvedilol (COREG) 25 MG tablet TAKE ONE TABLET TWICE DAILY 12/21/14  Yes Antoine Poche, MD  dabigatran [facility])  150 MG CAPS capsule Take 150 mg by mouth 2 (two) times daily.   Yes Historical Provider, MD  digoxin (LANOXIN) 0.125 MG tablet Take 125 mcg by mouth daily.     Yes Historical Provider, MD  furosemide (LASIX) 40 MG tablet Take 1 tablet (40 mg total) by mouth every other day. 11/21/14  Yes Antoine Poche, MD  insulin aspart (NOVOLOG FLEXPEN) 100 UNIT/ML FlexPen Inject 20 Units into the skin 3 (three) times daily with meals. Per sliding scale   Yes Historical Provider, MD  insulin detemir (LEVEMIR) 100 UNIT/ML injection Inject 30 Units into the skin every morning.    Yes Historical Provider, MD  lisinopril (PRINIVIL,ZESTRIL) 5 MG tablet  Take 1 tablet (5 mg total) by mouth daily. 02/28/14  Yes Antoine Poche, MD  nitroGLYCERIN (NITROSTAT) 0.4 MG SL tablet Place 1 tablet (0.4 mg total) under the tongue every 5 (five) minutes as needed. 05/24/12  Yes Kathlen Brunswick, MD  potassium chloride SA (K-DUR,KLOR-CON) 20 MEQ tablet TAKE ONE TABLET TWICE DAILY 12/21/14  Yes Antoine Poche, MD  rosuvastatin (CRESTOR) 40 MG tablet Take 40 mg by mouth daily.   Yes Historical Provider, MD  aspirin EC 325 MG EC tablet Take 1 tablet (325 mg total) by mouth daily. 05/06/14   Butch Penny, MD  isosorbide mononitrate (IMDUR) 30 MG 24 hr tablet Take 1 tablet (30 mg total) by mouth daily. 09/08/13   Butch Penny, MD  ranolazine (RANEXA) 500 MG 12 hr tablet Take 1 tablet (500 mg total) by mouth 2 (two) times daily. 06/13/14   Butch Penny, MD   Physical Exam: Filed Vitals:   06/09/15 1745 06/09/15 1800 06/09/15 1812 06/09/15 1815  BP: 104/75 89/75 111/61 99/52  Pulse: 39 63 82 66  Temp:      TempSrc:      Resp: Height:      Weight:      SpO2: 99% 100% 100% 100%    Wt Readings from Last 3 Encounters:  06/09/15 77.111 kg (170 lb)  02/15/15 90.719 kg (200 lb)  09/19/14 74.844 kg (165 lb)    General:  Appears calm and comfortable. He does not appear to be in pain currently. Eyes: PERRL, normal lids, irises & conjunctiva ENT: grossly normal hearing, lips & tongue Neck: no LAD, masses or thyromegaly Cardiovascular: Irregularly irregular, consistent with atrial fibrillation. Telemetry: Atrial fibrillation, rate is controlled. Respiratory: CTA bilaterally, no w/r/r. Normal respiratory effort. Abdomen: soft, ntnd Skin: no rash or induration seen on limited exam Musculoskeletal: grossly normal tone BUE/BLE Psychiatric: grossly normal mood and affect, speech fluent and appropriate Neurologic: grossly non-focal.          Labs on Admission:  Basic Metabolic Panel:  Recent Labs Lab 06/09/15 1600  NA 139  K 4.5  CL 113*    CO2 19*  GLUCOSE 144*  BUN 26*  CREATININE 1.47*  CALCIUM 8.3*   Liver Function Tests: No results for input(s): AST, ALT, ALKPHOS, BILITOT, PROT, ALBUMIN in the last 168 hours. No results for input(s): LIPASE, AMYLASE in the last 168 hours. No results for input(s): AMMONIA in the last 168 hours. CBC:  Recent Labs Lab 06/09/15 1600  WBC 10.7*  NEUTROABS 7.1  HGB 13.4  HCT 40.2  MCV 95.0  PLT 171   Cardiac Enzymes:  Recent Labs Lab 06/09/15 1600  TROPONINI <0.03    BNP (last 3 results) No results for input(s): BNP in the last 8760 hours.  ProBNP (last 3 results)  Recent Labs  06/11/14 0229 08/22/14 1937 09/02/14 0030  PROBNP 2933.0* 1437.0* 1831.0*    CBG: No results for input(s): GLUCAP in the last 168 hours.  Radiological Exams on Admission: Dg Chest Portable 1 View  06/09/2015   CLINICAL DATA:  Chest pain for several hours  EXAM: PORTABLE CHEST - 1 VIEW  COMPARISON:  September 02, 2014  FINDINGS: There is no edema or consolidation. Heart is mildly enlarged with pulmonary vascularity within normal limits. Patient is status post internal mammary bypass grafting. No adenopathy. No bone lesions.  IMPRESSION: Stable cardiac enlargement.  No edema or consolidation.   Electronically Signed   By: Bretta Bang III M.D.   On: 06/09/2015 16:09    EKG: Independently reviewed. Atrial fibrillation without any acute ST-T wave changes.  Assessment/Plan   1. Chest pain. Overall, I am not totally convinced of the pain from a cardiac point of view. However, he does have a history of coronary artery disease and multiple risk factors including diabetes and smoking. He will be admitted to telemetry and headache enzymes will be cycled. 2. Diabetes. Continue home medications and sliding scale of insulin. 3. Chronic kidney disease. Creatinine appears to be at around baseline. 4. Ongoing tobacco abuse. 5. Chronic atrial fibrillation. Ventricular rate is controlled. He is on  chronic anticoagulations therapy and we will continue this.  Further recommendations will depend on patient's hospital progress.   Code Status: Full code.   DVT Prophylaxis: Pradaxa  Family Communication: I discussed the plan with the patient at the bedside.   Disposition Plan: Home when medically stable.   Time spent: 45 minutes.  Wilson Singer [facility] Pager [phone].

## 2015-06-10 DIAGNOSIS — I739 Peripheral vascular disease, unspecified: Secondary | ICD-10-CM | POA: Diagnosis not present

## 2015-06-10 DIAGNOSIS — I2510 Atherosclerotic heart disease of native coronary artery without angina pectoris: Secondary | ICD-10-CM | POA: Diagnosis not present

## 2015-06-10 DIAGNOSIS — I25110 Atherosclerotic heart disease of native coronary artery with unstable angina pectoris: Secondary | ICD-10-CM | POA: Diagnosis not present

## 2015-06-10 DIAGNOSIS — I4891 Unspecified atrial fibrillation: Secondary | ICD-10-CM | POA: Diagnosis not present

## 2015-06-10 LAB — BRAIN NATRIURETIC PEPTIDE: B NATRIURETIC PEPTIDE 5: 174 pg/mL — AB (ref 0.0–100.0)

## 2015-06-10 LAB — GLUCOSE, CAPILLARY
GLUCOSE-CAPILLARY: 281 mg/dL — AB (ref 65–99)
GLUCOSE-CAPILLARY: 165 mg/dL — AB (ref 65–99)
Glucose-Capillary: 192 mg/dL — ABNORMAL HIGH (ref 65–99)
Glucose-Capillary: 212 mg/dL — ABNORMAL HIGH (ref 65–99)

## 2015-06-10 LAB — TROPONIN I: TROPONIN I: 0.03 ng/mL (ref <0.031)

## 2015-06-10 LAB — MAGNESIUM: Magnesium: 2.2 mg/dL (ref 1.7–2.4)

## 2015-06-10 NOTE — Progress Notes (Signed)
Notified by central telemetry of 5 beat run tach, MD paged. Pt is asymptomatic, will continue to monitor.

## 2015-06-10 NOTE — Progress Notes (Signed)
Subjective: The patient has been free of chest pain since admission. The patient does have coronary artery disease. He is diabetic. He does have a history of chronic atrial fibrillation and is on chronic anticoagulation therapy. His troponins have been normal  Objective: Vital signs in last 24 hours: Temp:  [97.8 F (36.6 C)-98.4 F (36.9 C)] 98.3 F (36.8 C) (06/19 0519) Pulse Rate:  [38-89] 70 (06/19 0519) Resp:  [14-29] 18 (06/19 0519) BP: (85-116)/(52-75) 105/73 mmHg (06/19 0519) SpO2:  [98 %-100 %] 100 % (06/19 0519) Weight:  [73.936 kg (163 lb)-77.111 kg (170 lb)] 73.936 kg (163 lb) (06/18 2042) Weight change:  Last BM Date: 06/09/15  Intake/Output from previous day: 06/18 0701 - 06/19 0700 In: 240 [P.O.:240] Out: -  Intake/Output this shift:    Physical Exam: General appearance patient is alert and oriented  H EENT negative  Neck supple no JVD or thyroid abnormalities  Heart irregular rhythm  Lungs clear to P&A  Abdomen no palpable organs or masses  Skin warm and dry   Recent Labs  06/09/15 1600 06/09/15 1839  WBC 10.7* 14.6*  HGB 13.4 13.8  HCT 40.2 41.1  PLT 171 158   BMET  Recent Labs  06/09/15 1600 06/09/15 1839  NA 139  --   K 4.5  --   CL 113*  --   CO2 19*  --   GLUCOSE 144*  --   BUN 26*  --   CREATININE 1.47* 1.34*  CALCIUM 8.3*  --     Studies/Results: Dg Chest Portable 1 View  06/09/2015   CLINICAL DATA:  Chest pain for several hours  EXAM: PORTABLE CHEST - 1 VIEW  COMPARISON:  September 02, 2014  FINDINGS: There is no edema or consolidation. Heart is mildly enlarged with pulmonary vascularity within normal limits. Patient is status post internal mammary bypass grafting. No adenopathy. No bone lesions.  IMPRESSION: Stable cardiac enlargement.  No edema or consolidation.   Electronically Signed   By: Bretta Bang III M.D.   On: 06/09/2015 16:09    Medications:  . aspirin EC  81 mg Oral Daily  . carvedilol  12.5 mg Oral BID   . dabigatran  150 mg Oral BID  . digoxin  125 mcg Oral Daily  . furosemide  40 mg Oral QODAY  . insulin aspart  0-15 Units Subcutaneous TID WC  . insulin aspart  0-5 Units Subcutaneous QHS  . insulin detemir  30 Units Subcutaneous q morning - 10a  . isosorbide mononitrate  30 mg Oral Daily  . lisinopril  5 mg Oral Daily  . potassium chloride SA  20 mEq Oral BID  . rosuvastatin  40 mg Oral Daily        Assessment/Plan: Chest pain-prior history of coronary artery disease with multiple risk factors-currently normal troponins  2 diabetes mellitus-continue to monitor sugars continue sliding scale insulin  3. Chronic atrial fibrillation with controlled rate  4 chronic kidney disease at baseline     Marlee Trentman G 06/10/2015, 7:12 AM

## 2015-06-10 NOTE — Progress Notes (Signed)
Notified by central telemetry of 2.34 second pause, MD notified and lab work ordered. Will continue to monitor pt.

## 2015-06-11 DIAGNOSIS — R079 Chest pain, unspecified: Secondary | ICD-10-CM | POA: Diagnosis not present

## 2015-06-11 DIAGNOSIS — I25110 Atherosclerotic heart disease of native coronary artery with unstable angina pectoris: Secondary | ICD-10-CM | POA: Diagnosis not present

## 2015-06-11 LAB — GLUCOSE, CAPILLARY
GLUCOSE-CAPILLARY: 149 mg/dL — AB (ref 65–99)
Glucose-Capillary: 142 mg/dL — ABNORMAL HIGH (ref 65–99)
Glucose-Capillary: 225 mg/dL — ABNORMAL HIGH (ref 65–99)
Glucose-Capillary: 185 mg/dL — ABNORMAL HIGH (ref 65–99)

## 2015-06-11 MED ORDER — RANOLAZINE ER 500 MG PO TB12
500.0000 mg | ORAL_TABLET | Freq: Two times a day (BID) | ORAL | Status: DC
  Administered 2015-06-11 – 2015-06-13 (×5): 500 mg via ORAL
  Filled 2015-06-11 (×5): qty 1

## 2015-06-11 NOTE — Care Management Note (Signed)
Case Management Note  Patient Details  Name: LAYTH URBIETA MRN: [id number] Date of Birth: [birth year]  Subjective/Objective:                  Pt admitted from home with CP. Pt lives with his wife and will return home at discharge. Pt is independent with ADL's.  Action/Plan: Anticipate discharge within 24 hours. Pt given the Medicare OBS notification sheet and pt signed copy on chart. No further CM needs noted.  Expected Discharge Date:                  Expected Discharge Plan:  Home/Self Care  In-House Referral:  NA  Discharge planning Services  CM Consult  Post Acute Care Choice:  NA Choice offered to:  NA  DME Arranged:    DME Agency:     HH Arranged:    HH Agency:     Status of Service:  Completed, signed off  Medicare Important Message Given:    Date Medicare IM Given:    Medicare IM give by:    Date Additional Medicare IM Given:    Additional Medicare Important Message give by:     If discussed at Long Length of Stay Meetings, dates discussed:    Additional Comments:  Cheryl Flash, RN 06/11/2015, 3:01 PM

## 2015-06-11 NOTE — Consult Note (Signed)
Primary cardiologist: Dr Dina Rich MD Consulting cardiologist: Dr Dina Rich MD  Clinical Summary Mr. Manahan is a 69 y.o.male history fo CAD with last cath 11/2013 with severe 3 vessel CAD, SVG x2 chronically occluded (MPI before cath with large anteroseptal scar, moderate inferolateral scar with minimal peri-infarct ischemia, , and patent LIMA-LAD. Overall stable unchanged coronary anatomy. Previous caths describe anatomy not amenable to revascularization. Long history of chronic chest pain. He has history of chronic systolic heart failure with LVEF 35-40%, afib on pradaxa, PAD, HL, DM2 admitted with chest pain. Medical therapy for his CAD and CHF has been limited due to chronic low blood pressures and orthostatic symptoms. From last visit had some improvement on ranexa but does not appear he is still taking.   He describes a several year history of chest. On average 2-3 times a month, pressure in midchest 7/10. Can occur at rest or with exertion. Can last up to 1 hour. Is not positional. No relation to food. He states to me symptoms have not progressed in frequency or severity over the last several months. He reports medication compliance however is not overally familiar with his regimen and does not recall what happnened to his ranexa.    WBC 10.7, Hgb 13.4, Plt 171, Cr 1.47, K 4.5, Mg 2.2, BNP 174, trop neg x 4 CXR no acute process EKG low voltage, chronic ST/T changes, no acute ischemic changes   No Known Allergies  Medications Scheduled Medications: . aspirin EC  81 mg Oral Daily  . carvedilol  12.5 mg Oral BID  . dabigatran  150 mg Oral BID  . digoxin  125 mcg Oral Daily  . furosemide  40 mg Oral QODAY  . insulin aspart  0-15 Units Subcutaneous TID WC  . insulin aspart  0-5 Units Subcutaneous QHS  . insulin detemir  30 Units Subcutaneous q morning - 10a  . isosorbide mononitrate  30 mg Oral Daily  . lisinopril  5 mg Oral Daily  . potassium chloride SA  20 mEq Oral  BID  . rosuvastatin  40 mg Oral Daily     Infusions:     PRN Medications:  acetaminophen, albuterol, nitroGLYCERIN, ondansetron (ZOFRAN) IV   Past Medical History  Diagnosis Date  . Coronary atherosclerosis of native coronary artery     Ant. MI in 4/95; PTCA of 90% prox & distal LAD unsuccessful-->  Urgent CABG-4/95 TO Cx; 50% RCA; ant. HK; EF of 50-55%; 08/2008: 3-V disease plus a  95% LIMA stenosis, treated with DES; occlusion of SVG to CX; RCA SVG stenosis--> PCI with DES  2010: in-stent restenosis in the LIMA-->cutting balloon; EF of 35-40%;  07/2010: TO of SVG to     RCA-medical therapy advised; EF of 30-35% by echo in 12/2010  . Atrial fibrillation 1995    1995, 2009; bradycardia with beta blocker therapy  . Tobacco abuse     40-50 pack years; currently one pack per day  . Peripheral vascular disease 1995    Abdominal aortic obstruction 1995-->vascular surgery  . Hyperlipidemia   . Type 2 diabetes mellitus   . DDD (degenerative disc disease)     Discectomy and fusion at L4 and L5  . Alcohol use 1999    Excessive alcohol use- quit in 1999  . Chronic systolic heart failure     LVEF 25-30% 2011  . Non-compliance     Past Surgical History  Procedure Laterality Date  . Cholecystectomy  1995  . Lumbar fusion      +  Discectomy;x2; L4 and L5  . Aorto-femoral bypass graft  1995  . Left heart catheterization with coronary angiogram N/A 12/14/2013    Procedure: LEFT HEART CATHETERIZATION WITH CORONARY ANGIOGRAM;  Surgeon: Micheline Chapman, MD;  Location: [facility] CATH LAB;  Service: Cardiovascular;  Laterality: N/A;    Family History  Problem Relation Age of Onset  . CAD Father     Social History Mr. Hubers reports that he has been smoking Cigarettes.  He has a 40 pack-year smoking history. He uses smokeless tobacco. Mr. Narramore reports that he does not drink alcohol.  Review of Systems CONSTITUTIONAL: No weight loss, fever, chills, weakness or fatigue.  HEENT: Eyes: No visual  loss, blurred vision, double vision or yellow sclerae. No hearing loss, sneezing, congestion, runny nose or sore throat.  SKIN: No rash or itching.  CARDIOVASCULAR: per HPI RESPIRATORY: No shortness of breath, cough or sputum.  GASTROINTESTINAL: No anorexia, nausea, vomiting or diarrhea. No abdominal pain or blood.  GENITOURINARY: no polyuria, no dysuria NEUROLOGICAL: No headache, dizziness, syncope, paralysis, ataxia, numbness or tingling in the extremities. No change in bowel or bladder control.  MUSCULOSKELETAL: No muscle, back pain, joint pain or stiffness.  HEMATOLOGIC: No anemia, bleeding or bruising.  LYMPHATICS: No enlarged nodes. No history of splenectomy.  PSYCHIATRIC: No history of depression or anxiety.      Physical Examination Blood pressure 110/60, pulse 63, temperature 98 F (36.7 C), temperature source Oral, resp. rate 20, height  (1.753 m), weight 163 lb (73.936 kg), SpO2 100 %.  Intake/Output Summary (Last 24 hours) at 06/11/15 0956 Last data filed at 06/11/15 0840  Gross per 24 hour  Intake    480 ml  Output    350 ml  Net    130 ml    HEENT: sclera clear  Cardiovascular: RRR, no m/rg, no   Respiratory: CTAB  GI: abdomen soft, NT, ND  MSK: no LE edema  Neuro: no focal deficits  Psych: appropriate affect   Lab Results  Basic Metabolic Panel:  Recent Labs Lab 06/09/15 1600 06/09/15 1839 06/10/15 0616  NA 139  --   --   K 4.5  --   --   CL 113*  --   --   CO2 19*  --   --   GLUCOSE 144*  --   --   BUN 26*  --   --   CREATININE 1.47* 1.34*  --   CALCIUM 8.3*  --   --   MG  --   --  2.2    Liver Function Tests: No results for input(s): AST, ALT, ALKPHOS, BILITOT, PROT, ALBUMIN in the last 168 hours.  CBC:  Recent Labs Lab 06/09/15 1600 06/09/15 1839  WBC 10.7* 14.6*  NEUTROABS 7.1  --   HGB 13.4 13.8  HCT 40.2 41.1  MCV 95.0 95.8  PLT 171 158    Cardiac Enzymes:  Recent Labs Lab 06/09/15 1600 06/09/15 1839  06/09/15 2207 06/10/15 0110  TROPONINI <0.03 <0.03 <0.03 0.03    BNP: Invalid input(s): POCBNP    Impression/Recommendations 1. CAD - long history of chronic stable angina, he has known CAD that is not amenable to revascularization. By history symptoms have not significantly progressed in severity or frequency - medical therapy has been limited by low bp's and orthostatic symptoms, including a prior episode of orthostatic syncope - he had done well on ranexa but for some reason is no longer taking at home - no evidence of ACS  by EKG or enzymes - will restart ranexa  bid, continue to work to optimize medical therapy. Do not plan for repeat ischemic testing at this time   2. Afib - rate controlled, continued pradaxa for stroke prevention. CHADS2Vasc score is 4.   3. Chronic systolic HF - appears euvolemic - medical therapy has been limited due low bp's - continue current meds    Dina Rich, M.D.

## 2015-06-11 NOTE — Progress Notes (Signed)
Inpatient Diabetes Program Recommendations  AACE/ADA: New Consensus Statement on Inpatient Glycemic Control (2013)  Target Ranges:  Prepandial:   less than 140 mg/dL      Peak postprandial:   less than 180 mg/dL (1-2 hours)      Critically ill patients:  140 - 180 mg/dL   Results for USAMA, BOSHERS (MRN [id number]) as of 06/11/2015 10:04  Ref. Range 06/09/2015 20:49 06/10/2015 09:54 06/10/2015 11:28 06/10/2015 16:35 06/10/2015 21:04 06/11/2015 07:21  Glucose-Capillary Latest Ref Range: 65-99 mg/dL 063 (H) 016 (H) 010 (H) 165 (H) 212 (H) 142 (H)   Diabetes history: DM2 Outpatient Diabetes medications: Levemir 30 units QAM, Novolog 20 units TID with meals Current orders for Inpatient glycemic control: Levemir 30 units QAM, Novolog 0-15 units TID with meals,, Novolog 0-5 units HS  Inpatient Diabetes Program Recommendations Insulin - Meal Coverage: Please consider ordering Novolog 4 units TID with meals for meal coverage if patient eats at least 50% of meals.  Thanks, [name], RN, MSN, CCRN, CDE Diabetes Coordinator Inpatient Diabetes Program [phone] (Team Pager from 8am to 5pm) [phone] ([facility] office) [phone] [facility] office) [phone] [facility] office)

## 2015-06-11 NOTE — Progress Notes (Signed)
Patient lying in bed no true anginal pain since this morning dynamically stable Ranexa 500 mg by mouth twice a day started [name] LKG:[id number] DOB: 01/16/1946 DOA: 06/09/2015 PCP: Vincent Stalling, MD             Physical Exam: Blood pressure 110/60, pulse 63, temperature 98 F (36.7 C), temperature source Oral, resp. rate 20, height 5\' 9"  (1.753 m), weight 163 lb (73.936 kg), SpO2 100 %. lungs show prolonged x-ray phase scattered rhonchi no rales no wheezes heart irregular regular no S3 no heave thrills or rubs abdomen soft nontender bowel sounds normoactive   Investigations:  No results found for this or any previous visit (from the past 240 hour(s)).   Basic Metabolic Panel:  Recent Labs  66/44/03 1600 06/09/15 1839 06/10/15 0616  NA 139  --   --   K 4.5  --   --   CL 113*  --   --   CO2 19*  --   --   GLUCOSE 144*  --   --   BUN 26*  --   --   CREATININE 1.47* 1.34*  --   CALCIUM 8.3*  --   --   MG  --   --  2.2   Liver Function Tests: No results for input(s): AST, ALT, ALKPHOS, BILITOT, PROT, ALBUMIN in the last 72 hours.   CBC:  Recent Labs  06/09/15 1600 06/09/15 1839  WBC 10.7* 14.6*  NEUTROABS 7.1  --   HGB 13.4 13.8  HCT 40.2 41.1  MCV 95.0 95.8  PLT 171 158    Dg Chest Portable 1 View  06/09/2015   CLINICAL DATA:  Chest pain for several hours  EXAM: PORTABLE CHEST - 1 VIEW  COMPARISON:  September 02, 2014  FINDINGS: There is no edema or consolidation. Heart is mildly enlarged with pulmonary vascularity within normal limits. Patient is status post internal mammary bypass grafting. No adenopathy. No bone lesions.  IMPRESSION: Stable cardiac enlargement.  No edema or consolidation.   Electronically Signed   By: [name] M.D.   On: 06/09/2015 16:09      Medications:   Impression:  Active Problems:   Diabetes   TOBACCO ABUSE   Arteriosclerotic cardiovascular disease (ASCVD)   Chest pain     Plan: Add Ranexa  500 mg by mouth twice a day observe hemodynamic response response blood pressure response and anginal chest pain response ambulate patient continue pyridoxine for stroke prevention and A. fib  Consultants: Cardiology Dr. Wyline [name]   Procedures   Antibiotics:                   Code Status: Full  Family Communication:    Disposition Plan see plan above  Time spent: 30 minutes     [name] M   06/11/2015, 12:17 PM

## 2015-06-12 DIAGNOSIS — I9589 Other hypotension: Secondary | ICD-10-CM | POA: Diagnosis not present

## 2015-06-12 DIAGNOSIS — Z720 Tobacco use: Secondary | ICD-10-CM | POA: Diagnosis not present

## 2015-06-12 DIAGNOSIS — I482 Chronic atrial fibrillation: Secondary | ICD-10-CM

## 2015-06-12 DIAGNOSIS — I25708 Atherosclerosis of coronary artery bypass graft(s), unspecified, with other forms of angina pectoris: Secondary | ICD-10-CM

## 2015-06-12 DIAGNOSIS — I25110 Atherosclerotic heart disease of native coronary artery with unstable angina pectoris: Secondary | ICD-10-CM | POA: Diagnosis not present

## 2015-06-12 DIAGNOSIS — I209 Angina pectoris, unspecified: Secondary | ICD-10-CM

## 2015-06-12 DIAGNOSIS — I2510 Atherosclerotic heart disease of native coronary artery without angina pectoris: Secondary | ICD-10-CM | POA: Diagnosis not present

## 2015-06-12 LAB — GLUCOSE, CAPILLARY
GLUCOSE-CAPILLARY: 240 mg/dL — AB (ref 65–99)
GLUCOSE-CAPILLARY: 145 mg/dL — AB (ref 65–99)
GLUCOSE-CAPILLARY: 285 mg/dL — AB (ref 65–99)
Glucose-Capillary: 176 mg/dL — ABNORMAL HIGH (ref 65–99)

## 2015-06-12 NOTE — Progress Notes (Signed)
Pt B/P 88/31 and pulse 55. Dr. Janna Arch notified and given verbal order to give patient his medications.

## 2015-06-12 NOTE — Progress Notes (Signed)
Consulting cardiologist: Prentice Docker MD Primary Cardiologist: Dina Rich MD  Cardiology Specific Problem List: 1.CAD-Medical Management  2. Atrial fib 3. Systolic CHF  Subjective:    Breathing better. No chest pain.  Objective:   Temp:  [97.9 F (36.6 C)-98.2 F (36.8 C)] 97.9 F (36.6 C) (06/21 0646) Pulse Rate:  [55-108] 55 (06/21 0845) Resp:  [20] 20 (06/21 0646) BP: (88-108)/(31-74) 88/31 mmHg (06/21 0845) SpO2:  [100 %] 100 % (06/21 0646) Last BM Date: 06/11/15  Filed Weights   06/09/15 1537 06/09/15 2042  Weight: 170 lb (77.111 kg) 163 lb (73.936 kg)    Intake/Output Summary (Last 24 hours) at 06/12/15 1003 Last data filed at 06/12/15 0646  Gross per 24 hour  Intake    960 ml  Output    300 ml  Net    660 ml   Echocardiogram 05/04/2014 Study data: Technically difficult study. - Left ventricle: The cavity size was normal. Wall thickness was increased increased in a pattern of mild to moderate LVH. Systolic function was moderately to severely reduced. The estimated ejection fraction was in the range of 35% to 40%. Compared to prior study 09/07/13 LVEF has improved. - Aortic valve: Mildly calcified annulus. Trileaflet; mildly thickened leaflets. Valve area: 1.65cm^2(VTI). Valve area: 1.76cm^2 (Vmax). - Mitral valve: Mildly calcified annulus. Mildly thickened leaflets . Mild regurgitation. - Left atrium: The atrium was severely dilated. - Right ventricle: The cavity size was mildly dilated. - Right atrium: The atrium was severely dilated.  Telemetry:Atrial fib  Exam:  General: No acute distress.  HEENT: Conjunctiva and lids normal, oropharynx clear.  Lungs: Clear to auscultation, nonlabored.  Cardiac: No elevated JVP or bruits. IRRR, no gallop or rub.   Abdomen: Normoactive bowel sounds, nontender, nondistended.  Extremities: No pitting edema, distal pulses full. Very dirty nail beds and under nails.    Neuropsychiatric: Alert and oriented x3, affect appropriate. Hard of hearing.    Lab Results:  Basic Metabolic Panel:  Recent Labs Lab 06/09/15 1600 06/09/15 1839 06/10/15 0616  NA 139  --   --   K 4.5  --   --   CL 113*  --   --   CO2 19*  --   --   GLUCOSE 144*  --   --   BUN 26*  --   --   CREATININE 1.47* 1.34*  --   CALCIUM 8.3*  --   --   MG  --   --  2.2    CBC:  Recent Labs Lab 06/09/15 1600 06/09/15 1839  WBC 10.7* 14.6*  HGB 13.4 13.8  HCT 40.2 41.1  MCV 95.0 95.8  PLT 171 158    Cardiac Enzymes:  Recent Labs Lab 06/09/15 1839 06/09/15 2207 06/10/15 0110  TROPONINI <0.03 <0.03 0.03    BNP:  Recent Labs  08/22/14 1937 09/02/14 0030  PROBNP 1437.0* 1831.0*     ECG:   Medications:   Scheduled Medications: . aspirin EC  81 mg Oral Daily  . carvedilol  12.5 mg Oral BID  . dabigatran  150 mg Oral BID  . digoxin  125 mcg Oral Daily  . furosemide  40 mg Oral QODAY  . insulin aspart  0-15 Units Subcutaneous TID WC  . insulin aspart  0-5 Units Subcutaneous QHS  . insulin detemir  30 Units Subcutaneous q morning - 10a  . isosorbide mononitrate  30 mg Oral Daily  . lisinopril  5 mg Oral Daily  . potassium  chloride SA  20 mEq Oral BID  . ranolazine  500 mg Oral BID  . rosuvastatin  40 mg Oral Daily    Infusions:    PRN Medications: acetaminophen, albuterol, nitroGLYCERIN, ondansetron (ZOFRAN) IV   Assessment and Plan:   1. CAD: No chest pain. Medical management only as he is not amendable to PCI. Troponin negative. Started back on Ranexa.  Can go home from cardiac standpoint. Continue statin, ACE, and nitrates.   2. Atrial Fib: CHADS VASC Score 4. Continue pradaxa, and coreg 12.5 mg BID, and lanoxin. Follow up with Dr. [name] as OP for post hospital check. Appt made for June 28, 2015  3. Systolic CHF: Low sodium diet and continued lasix 40 mg daily with potassium. Wt is down 7 lbs.   4. Diabetes: Per PCP  Bettey Mare.  Lawrence NP [facility]  06/12/2015, 10:03 AM   The patient was seen and examined, and I agree with the assessment and plan as documented above, with modifications as noted below. Pt denies chest pain. Ranexa restarted. Medical management of CAD limited by hypotension, which is not amenable to PCI. Continue present medical therapy. No additional recommendations. Follow up with Dr. [name] arranged. Will sign off.

## 2015-06-12 NOTE — Progress Notes (Signed)
[name] TDD:[id number] DOB: 02-16-1946 DOA: 06/09/2015 PCP: Vincent Stalling, MD             Physical Exam: Blood pressure 108/74, pulse 74, temperature 97.9 F (36.6 C), temperature source Oral, resp. rate 20, height 5\' 9"  (1.753 m), weight 163 lb (73.936 kg), SpO2 100 %. lungs clear to A&P no rales wheeze rhonchi heart rhythm heart irregularly  irregular no S3 no heave thrills rubs abdomen soft nontender bowel sounds normoactive   Investigations:  No results found for this or any previous visit (from the past 240 hour(s)).   Basic Metabolic Panel:  Recent Labs  62/37/62 1600 06/09/15 1839 06/10/15 0616  NA 139  --   --   K 4.5  --   --   CL 113*  --   --   CO2 19*  --   --   GLUCOSE 144*  --   --   BUN 26*  --   --   CREATININE 1.47* 1.34*  --   CALCIUM 8.3*  --   --   MG  --   --  2.2   Liver Function Tests: No results for input(s): AST, ALT, ALKPHOS, BILITOT, PROT, ALBUMIN in the last 72 hours.   CBC:  Recent Labs  06/09/15 1600 06/09/15 1839  WBC 10.7* 14.6*  NEUTROABS 7.1  --   HGB 13.4 13.8  HCT 40.2 41.1  MCV 95.0 95.8  PLT 171 158    No results found.    Medications:   Impression:  Active Problems:   Diabetes   TOBACCO ABUSE   Arteriosclerotic cardiovascular disease (ASCVD)   Chest pain     Plan: Continue Ranexa 500 mg twice a day continue pra DaXA for stroke mitigation. Ambulate patient observe for any ischemic symptomatology  Consultants: Cardiology   Procedures   Antibiotics:                   Code Status:  Family Communication:    Disposition Plan see plan above  Time spent: 30 minutes     [name] M   06/12/2015, 6:52 AM

## 2015-06-13 DIAGNOSIS — I25110 Atherosclerotic heart disease of native coronary artery with unstable angina pectoris: Secondary | ICD-10-CM | POA: Diagnosis not present

## 2015-06-13 LAB — GLUCOSE, CAPILLARY
GLUCOSE-CAPILLARY: 174 mg/dL — AB (ref 65–99)
Glucose-Capillary: 295 mg/dL — ABNORMAL HIGH (ref 65–99)

## 2015-06-13 MED ORDER — RANOLAZINE ER 500 MG PO TB12: 500.0000 mg | ORAL_TABLET | Freq: Two times a day (BID) | ORAL | Status: DC

## 2015-06-13 NOTE — Progress Notes (Signed)
Patient discharged home.  IV removed - WNL.  Reviewed medications and follow up in place with cardio.  Advised to quit smoking.  No questions at this time.  Verbalizes understanding of DC instructions.  Stable to DC at this time.

## 2015-06-13 NOTE — Discharge Summary (Signed)
Physician Discharge Summary  [name]:[id number] DOB: 1946/09/16 DOA: 06/09/2015  PCP: [name], MD  Admit date: 06/09/2015 Discharge date: 06/13/2015   Recommendations for Outpatient Follow-up:  A she will take always aforementioned medicines including the insulin as prescribed prior to admission with the addition of Ranexa 500 mg by mouth twice a day he is also strongly advised to take Pradaxa once a day as ordered to mitigate CVA possibilities with his chronic A. fib patient understands this and will follow-up in the office in one week's time to assess hemodynamics blood pressure response dizziness and any typical or atypical chest pain some Ancef thank you Discharge Diagnoses:  Active Problems:   Diabetes   TOBACCO ABUSE   Arteriosclerotic cardiovascular disease (ASCVD)   Chest pain   Discharge Condition: Good  Filed Weights   06/09/15 1537 06/09/15 2042  Weight: 170 lb (77.111 kg) 163 lb (73.936 kg)    History of present illness:  Should known coronary artery disease diminished cognitive functioning who reported a history of chest pressure on occasion and at rest was seen in consultation at the nitrates added was seen in consultation by cardiology cardiac enzymes were negative 3 2-D echo revealed ejection range of 4045% with inferobasilar hypokinesis which is chronic he had functional evaluation showing no evidence of reversible ischemia and had his medical regimen optimize he complained of no anginal type chest pain for the last 36-48 hours in hospital despite ambulation and observation was subtotally discharged  Hospital Course:  See history of present illness  Procedures:    Consultations:  Cardiology  Discharge Instructions  Discharge Instructions    Discharge instructions    Complete by:  As directed      Discharge patient    Complete by:  As directed             Medication List    TAKE these medications        albuterol 108 (90  BASE) MCG/ACT inhaler  Commonly known as:  PROVENTIL HFA;VENTOLIN HFA  Inhale 2 puffs into the lungs every 6 (six) hours as needed for wheezing or shortness of breath.     aspirin EC 81 MG tablet  Take 81 mg by mouth daily.     aspirin 325 MG EC tablet  Take 1 tablet (325 mg total) by mouth daily.     carvedilol 25 MG tablet  Commonly known as:  COREG  TAKE ONE TABLET TWICE DAILY     digoxin 0.125 MG tablet  Commonly known as:  LANOXIN  Take 125 mcg by mouth daily.     furosemide 40 MG tablet  Commonly known as:  LASIX  Take 1 tablet (40 mg total) by mouth every other day.     insulin detemir 100 UNIT/ML injection  Commonly known as:  LEVEMIR  Inject 30 Units into the skin every morning.     isosorbide mononitrate 30 MG 24 hr tablet  Commonly known as:  IMDUR  Take 1 tablet (30 mg total) by mouth daily.     lisinopril 5 MG tablet  Commonly known as:  PRINIVIL,ZESTRIL  Take 1 tablet (5 mg total) by mouth daily.     nitroGLYCERIN 0.4 MG SL tablet  Commonly known as:  NITROSTAT  Place 1 tablet (0.4 mg total) under the tongue every 5 (five) minutes as needed.     NOVOLOG FLEXPEN 100 UNIT/ML FlexPen  Generic drug:  insulin aspart  Inject 20 Units into the skin 3 (  three) times daily with meals. Per sliding scale     potassium chloride SA 20 MEQ tablet  Commonly known as:  K-DUR,KLOR-CON  TAKE ONE TABLET TWICE DAILY     PRADAXA 150 MG Caps capsule  Generic drug:  dabigatran  Take 150 mg by mouth 2 (two) times daily.     ranolazine 500 MG 12 hr tablet  Commonly known as:  RANEXA  Take 1 tablet (500 mg total) by mouth 2 (two) times daily.     ranolazine 500 MG 12 hr tablet  Commonly known as:  RANEXA  Take 1 tablet (500 mg total) by mouth 2 (two) times daily.     rosuvastatin 40 MG tablet  Commonly known as:  CRESTOR  Take 40 mg by mouth daily.       No Known Allergies     Follow-up Information    Follow up with [name], NP On 06/28/2015.    Specialties:  Nurse Practitioner, Radiology, Cardiology   Why:  1:10 pm    Contact information:   [address] [place] [address] [phone]        The results of significant diagnostics from this hospitalization (including imaging, microbiology, ancillary and laboratory) are listed below for reference.    Significant Diagnostic Studies: Dg Chest Portable 1 View  06/09/2015   CLINICAL DATA:  Chest pain for several hours  EXAM: PORTABLE CHEST - 1 VIEW  COMPARISON:  September 02, 2014  FINDINGS: There is no edema or consolidation. Heart is mildly enlarged with pulmonary vascularity within normal limits. Patient is status post internal mammary bypass grafting. No adenopathy. No bone lesions.  IMPRESSION: Stable cardiac enlargement.  No edema or consolidation.   Electronically Signed   By: Bretta Bang III M.D.   On: 06/09/2015 16:09    Microbiology: No results found for this or any previous visit (from the past 240 hour(s)).   Labs: Basic Metabolic Panel:  Recent Labs Lab 06/09/15 1600 06/09/15 1839 06/10/15 0616  NA 139  --   --   K 4.5  --   --   CL 113*  --   --   CO2 19*  --   --   GLUCOSE 144*  --   --   BUN 26*  --   --   CREATININE 1.47* 1.34*  --   CALCIUM 8.3*  --   --   MG  --   --  2.2   Liver Function Tests: No results for input(s): AST, ALT, ALKPHOS, BILITOT, PROT, ALBUMIN in the last 168 hours. No results for input(s): LIPASE, AMYLASE in the last 168 hours. No results for input(s): AMMONIA in the last 168 hours. CBC:  Recent Labs Lab 06/09/15 1600 06/09/15 1839  WBC 10.7* 14.6*  NEUTROABS 7.1  --   HGB 13.4 13.8  HCT 40.2 41.1  MCV 95.0 95.8  PLT 171 158   Cardiac Enzymes:  Recent Labs Lab 06/09/15 1600 06/09/15 1839 06/09/15 2207 06/10/15 0110  TROPONINI <0.03 <0.03 <0.03 0.03   BNP: BNP (last 3 results)  Recent Labs  06/10/15 0616  BNP 174.0*    ProBNP (last 3 results)  Recent Labs  08/22/14 1937 09/02/14 0030    PROBNP 1437.0* 1831.0*    CBG:  Recent Labs Lab 06/12/15 0726 06/12/15 1123 06/12/15 1610 06/12/15 2130 06/13/15 0749  GLUCAP 176* 240* 145* 285* 174*       Signed:  Kamrin Spath M  [facility] Pager: [phone] 06/13/2015, 11:13 AM

## 2015-06-13 NOTE — Care Management Note (Signed)
Case Management Note  Patient Details  Name: [name] MRN: [id number] Date of Birth: 25-Jan-1946  Subjective/Objective:                    Action/Plan:   Expected Discharge Date:                  Expected Discharge Plan:  Home/Self Care  In-House Referral:  NA  Discharge planning Services  CM Consult  Post Acute Care Choice:  NA Choice offered to:  NA  DME Arranged:    DME Agency:     HH Arranged:    HH Agency:     Status of Service:  Completed, signed off  Medicare Important Message Given:    Date Medicare IM Given:    Medicare IM give by:    Date Additional Medicare IM Given:    Additional Medicare Important Message give by:     If discussed at Long Length of Stay Meetings, dates discussed:    Additional Comments: Pt discharged today. No CM needs noted. Arlyss Queen McCune, RN 06/13/2015, 11:20 AM

## 2015-06-17 ENCOUNTER — Encounter (HOSPITAL_COMMUNITY): Payer: Self-pay

## 2015-06-17 ENCOUNTER — Emergency Department (HOSPITAL_COMMUNITY)
Admission: EM | Admit: 2015-06-17 | Discharge: 2015-06-17 | Disposition: A | Discharge status: Home / Self Care | Payer: Medicare Other | Attending: Emergency Medicine | Admitting: Emergency Medicine

## 2015-06-17 ENCOUNTER — Emergency Department (HOSPITAL_COMMUNITY): Payer: Medicare Other

## 2015-06-17 DIAGNOSIS — Z720 Tobacco use: Secondary | ICD-10-CM | POA: Diagnosis not present

## 2015-06-17 DIAGNOSIS — Z951 Presence of aortocoronary bypass graft: Secondary | ICD-10-CM | POA: Diagnosis not present

## 2015-06-17 DIAGNOSIS — E119 Type 2 diabetes mellitus without complications: Secondary | ICD-10-CM | POA: Diagnosis not present

## 2015-06-17 DIAGNOSIS — Z9119 Patient's noncompliance with other medical treatment and regimen: Secondary | ICD-10-CM | POA: Insufficient documentation

## 2015-06-17 DIAGNOSIS — I5022 Chronic systolic (congestive) heart failure: Secondary | ICD-10-CM | POA: Insufficient documentation

## 2015-06-17 DIAGNOSIS — Z79899 Other long term (current) drug therapy: Secondary | ICD-10-CM | POA: Insufficient documentation

## 2015-06-17 DIAGNOSIS — Z7982 Long term (current) use of aspirin: Secondary | ICD-10-CM | POA: Diagnosis not present

## 2015-06-17 DIAGNOSIS — R079 Chest pain, unspecified: Secondary | ICD-10-CM | POA: Diagnosis present

## 2015-06-17 DIAGNOSIS — I2510 Atherosclerotic heart disease of native coronary artery without angina pectoris: Secondary | ICD-10-CM | POA: Insufficient documentation

## 2015-06-17 DIAGNOSIS — Z794 Long term (current) use of insulin: Secondary | ICD-10-CM | POA: Diagnosis not present

## 2015-06-17 DIAGNOSIS — R0602 Shortness of breath: Secondary | ICD-10-CM | POA: Insufficient documentation

## 2015-06-17 DIAGNOSIS — E785 Hyperlipidemia, unspecified: Secondary | ICD-10-CM | POA: Diagnosis not present

## 2015-06-17 LAB — CBC WITH DIFFERENTIAL/PLATELET
BASOS PCT: 1 % (ref 0–1)
Basophils Absolute: 0.1 10*3/uL (ref 0.0–0.1)
Eosinophils Absolute: 0.3 10*3/uL (ref 0.0–0.7)
Eosinophils Relative: 2 % (ref 0–5)
HCT: 46.8 % (ref 39.0–52.0)
Hemoglobin: 15.8 g/dL (ref 13.0–17.0)
Lymphocytes Relative: 23 % (ref 12–46)
Lymphs Abs: 2.5 10*3/uL (ref 0.7–4.0)
MCH: 31.9 pg (ref 26.0–34.0)
MCHC: 33.8 g/dL (ref 30.0–36.0)
MCV: 94.4 fL (ref 78.0–100.0)
Monocytes Absolute: 0.8 10*3/uL (ref 0.1–1.0)
Monocytes Relative: 7 % (ref 3–12)
NEUTROS ABS: 7.1 10*3/uL (ref 1.7–7.7)
Neutrophils Relative %: 67 % (ref 43–77)
Platelets: 187 10*3/uL (ref 150–400)
RBC: 4.96 MIL/uL (ref 4.22–5.81)
RDW: 13.1 % (ref 11.5–15.5)
WBC: 10.7 10*3/uL — ABNORMAL HIGH (ref 4.0–10.5)

## 2015-06-17 LAB — BASIC METABOLIC PANEL
ANION GAP: 12 (ref 5–15)
BUN: 26 mg/dL — ABNORMAL HIGH (ref 6–20)
CALCIUM: 9 mg/dL (ref 8.9–10.3)
CO2: 23 mmol/L (ref 22–32)
Chloride: 97 mmol/L — ABNORMAL LOW (ref 101–111)
Creatinine, Ser: 1.44 mg/dL — ABNORMAL HIGH (ref 0.61–1.24)
GFR calc Af Amer: 56 mL/min — ABNORMAL LOW (ref >60)
GFR calc non Af Amer: 48 mL/min — ABNORMAL LOW (ref >60)
GLUCOSE: 332 mg/dL — AB (ref 65–99)
POTASSIUM: 4.6 mmol/L (ref 3.5–5.1)
SODIUM: 132 mmol/L — AB (ref 135–145)

## 2015-06-17 LAB — TROPONIN I: Troponin I: <0.03 ng/mL (ref <0.031)

## 2015-06-17 MED ORDER — NITROGLYCERIN 0.4 MG SL SUBL: 0.4000 mg | SUBLINGUAL_TABLET | SUBLINGUAL | Status: DC | PRN

## 2015-06-17 NOTE — ED Notes (Signed)
Pt stated he is pain free and asked for something to eat. notified EDP, was ordered that he could have food. Meal tray given. Tolerating well at this time.

## 2015-06-17 NOTE — ED Notes (Signed)
Discharge instructions given, pt demonstrated teach back and verbal understanding. No concerns voiced.  

## 2015-06-17 NOTE — Discharge Instructions (Signed)
Usual nitroglycerin at home as your cardiologist has prescribed it.  Return to ER with any recurrent chest pain not relieved by nitroglycerin at home.

## 2015-06-17 NOTE — ED Notes (Signed)
Pt c/o pain in center of chest that started 1 hour ago while watching Tv.  Also c/o sob.

## 2015-06-17 NOTE — ED Provider Notes (Signed)
CSN: [id number]     Arrival date & time 06/17/15  1750 History   First MD Initiated Contact with Patient 06/17/15 1754     Chief Complaint  Patient presents with  . Chest Pain     HPI  Patient presents for evaluation of chest pain. States his pain started 1 hour ago while he was sitting at home watching television. Stitches in the middle of his chest and is just "an ache". He states he has nitroglycerin at home but did not take any. He states "it wasn't that bad to start". Has a history of chronic chest pain. Has history of chronic angina. Has had previous bypass surgery. Last cath 2014 showed anatomy not amenable to revascularization. Medical therapy has been limited by his orthostatic hypotension and likely low blood pressures.  Worsening continues to smoke. Cannot describe to me much about his medicine regimen. When asked him if he is still taking Ranexa he states "taking what?"  Past Medical History  Diagnosis Date  . Coronary atherosclerosis of native coronary artery     Ant. MI in 4/95; PTCA of 90% prox & distal LAD unsuccessful-->  Urgent CABG-4/95 TO Cx; 50% RCA; ant. HK; EF of 50-55%; 08/2008: 3-V disease plus a  95% LIMA stenosis, treated with DES; occlusion of SVG to CX; RCA SVG stenosis--> PCI with DES  2010: in-stent restenosis in the LIMA-->cutting balloon; EF of 35-40%;  07/2010: TO of SVG to     RCA-medical therapy advised; EF of 30-35% by echo in 12/2010  . Atrial fibrillation 1995    1995, 2009; bradycardia with beta blocker therapy  . Tobacco abuse     40-50 pack years; currently one pack per day  . Peripheral vascular disease 1995    Abdominal aortic obstruction 1995-->vascular surgery  . Hyperlipidemia   . Type 2 diabetes mellitus   . DDD (degenerative disc disease)     Discectomy and fusion at L4 and L5  . Alcohol use 1999    Excessive alcohol use- quit in 1999  . Chronic systolic heart failure     LVEF 25-30% 2011  . Non-compliance    Past Surgical History   Procedure Laterality Date  . Cholecystectomy  1995  . Lumbar fusion      +Discectomy;x2; L4 and L5  . Aorto-femoral bypass graft  1995  . Left heart catheterization with coronary angiogram N/A 12/14/2013    Procedure: LEFT HEART CATHETERIZATION WITH CORONARY ANGIOGRAM;  Surgeon: Micheline Chapman, MD;  Location: [facility] CATH LAB;  Service: Cardiovascular;  Laterality: N/A;   Family History  Problem Relation Age of Onset  . CAD Father    History  Substance Use Topics  . Smoking status: Current Every Day Smoker -- 1.00 packs/day for 40 years    Types: Cigarettes  . Smokeless tobacco: Current User     Comment: refused  . Alcohol Use: No     Comment: former    Review of Systems  Constitutional: Negative for fever, chills, diaphoresis, appetite change and fatigue.  HENT: Negative for mouth sores, sore throat and trouble swallowing.   Eyes: Negative for visual disturbance.  Respiratory: Positive for shortness of breath. Negative for cough, chest tightness and wheezing.   Cardiovascular: Positive for chest pain.  Gastrointestinal: Negative for nausea, vomiting, abdominal pain, diarrhea and abdominal distention.  Endocrine: Negative for polydipsia, polyphagia and polyuria.  Genitourinary: Negative for dysuria, frequency and hematuria.  Musculoskeletal: Negative for gait problem.  Skin: Negative for color change, pallor and  rash.  Neurological: Negative for dizziness, syncope, light-headedness and headaches.  Hematological: Does not bruise/bleed easily.  Psychiatric/Behavioral: Negative for behavioral problems and confusion.      Allergies  Review of patient's allergies indicates no known allergies.  Home Medications   Prior to Admission medications   Medication Sig Start Date End Date Taking? Authorizing Provider  albuterol (PROVENTIL HFA;VENTOLIN HFA) 108 (90 BASE) MCG/ACT inhaler Inhale 2 puffs into the lungs every 6 (six) hours as needed for wheezing or shortness of breath.    Yes Historical Provider, MD  aspirin EC 325 MG EC tablet Take 1 tablet (325 mg total) by mouth daily. 05/06/14  Yes Angus [name], MD  aspirin EC 81 MG tablet Take 81 mg by mouth daily.   Yes Historical Provider, MD  carvedilol (COREG) 25 MG tablet TAKE ONE TABLET TWICE DAILY 12/21/14  Yes Antoine Poche, MD  dabigatran (PRADAXA) 150 MG CAPS capsule Take 150 mg by mouth 2 (two) times daily.   Yes Historical Provider, MD  digoxin (LANOXIN) 0.125 MG tablet Take 125 mcg by mouth daily.     Yes Historical Provider, MD  furosemide (LASIX) 40 MG tablet Take 1 tablet (40 mg total) by mouth every other day. 11/21/14  Yes Antoine Poche, MD  insulin detemir (LEVEMIR) 100 UNIT/ML injection Inject 30 Units into the skin every morning.    Yes Historical Provider, MD  isosorbide mononitrate (IMDUR) 30 MG 24 hr tablet Take 1 tablet (30 mg total) by mouth daily. 09/08/13  Yes Angus [name], MD  lisinopril (PRINIVIL,ZESTRIL) 5 MG tablet Take 1 tablet (5 mg total) by mouth daily. 02/28/14  Yes Antoine Poche, MD  nitroGLYCERIN (NITROSTAT) 0.4 MG SL tablet Place 1 tablet (0.4 mg total) under the tongue every 5 (five) minutes as needed. 05/24/12  Yes Kathlen Brunswick, MD  potassium chloride SA (K-DUR,KLOR-CON) 20 MEQ tablet TAKE ONE TABLET TWICE DAILY 12/21/14  Yes Antoine Poche, MD  ranolazine (RANEXA) 500 MG 12 hr tablet Take 1 tablet (500 mg total) by mouth 2 (two) times daily. 06/13/14  Yes Angus [name], MD  rosuvastatin (CRESTOR) 40 MG tablet Take 40 mg by mouth daily.   Yes Historical Provider, MD  ranolazine (RANEXA) 500 MG 12 hr tablet Take 1 tablet (500 mg total) by mouth 2 (two) times daily. 06/13/15   Oval Linsey, MD   BP 127/93 mmHg  Pulse 62  Temp(Src) 97.5 F (36.4 C) (Oral)  Resp 15  Ht  (1.753 m)  Wt 170 lb (77.111 kg)  BMI 25.09 kg/m2  SpO2 100% Physical Exam  Constitutional: He is oriented to person, place, and time. He appears well-developed and well-nourished. No distress.   HENT:  Head: Normocephalic.  Eyes: Conjunctivae are normal. Pupils are equal, round, and reactive to light. No scleral icterus.  Neck: Normal range of motion. Neck supple. No thyromegaly present.  Cardiovascular: Normal rate and regular rhythm.  Exam reveals no gallop and no friction rub.   No murmur heard. Pulmonary/Chest: Effort normal and breath sounds normal. No respiratory distress. He has no wheezes. He has no rales.  Abdominal: Soft. Bowel sounds are normal. He exhibits no distension. There is no tenderness. There is no rebound.  Musculoskeletal: Normal range of motion.  Neurological: He is alert and oriented to person, place, and time.  Skin: Skin is warm and dry. No rash noted.  Psychiatric: He has a normal mood and affect. His behavior is normal.    ED Course  Procedures (  including critical care time) Labs Review Labs Reviewed  CBC WITH DIFFERENTIAL/PLATELET - Abnormal; Notable for the following:    WBC 10.7 (*)    All other components within normal limits  BASIC METABOLIC PANEL - Abnormal; Notable for the following:    Sodium 132 (*)    Chloride 97 (*)    Glucose, Bld 332 (*)    BUN 26 (*)    Creatinine, Ser 1.44 (*)    GFR calc non Af Amer 48 (*)    GFR calc Af Amer 56 (*)    All other components within normal limits  TROPONIN I  TROPONIN I    Imaging Review Dg Chest Portable 1 View  06/17/2015   CLINICAL DATA:  Central chest pain and shortness of breath for 1 hr.  EXAM: PORTABLE CHEST - 1 VIEW  COMPARISON:  06/09/2015  FINDINGS: Patient is rotated to the left. Post median sternotomy. Cardiomediastinal contours are unchanged, heart at the upper limits normal in size. No pulmonary edema, confluent airspace disease, of and pleural effusion or pneumothorax. Osseous structures are unchanged.  IMPRESSION: Unchanged appearance of the chest.  No acute pulmonary process.   Electronically Signed   By: Rubye Oaks M.D.   On: 06/17/2015 18:27     EKG  Interpretation None      MDM   Final diagnoses:  Chest pain, unspecified chest pain type    Patient refuses any additional testing here. He refused also troponin. Discussed with him that this was the most important test of his day. He still declines. He remained symptom-free for several hours. He is discharged home. Usually nitroglycerin as prescribed. Return here with any recurrence not relieved with nitroglycerin glycerin at home.    Rolland Porter, MD 06/17/15 2134

## 2015-06-21 ENCOUNTER — Emergency Department (HOSPITAL_COMMUNITY)
Admission: EM | Admit: 2015-06-21 | Discharge: 2015-06-21 | Disposition: A | Discharge status: Home / Self Care | Payer: Medicare Other | Attending: Emergency Medicine | Admitting: Emergency Medicine

## 2015-06-21 ENCOUNTER — Encounter (HOSPITAL_COMMUNITY): Payer: Self-pay | Admitting: Emergency Medicine

## 2015-06-21 ENCOUNTER — Emergency Department (HOSPITAL_COMMUNITY): Payer: Medicare Other

## 2015-06-21 DIAGNOSIS — E119 Type 2 diabetes mellitus without complications: Secondary | ICD-10-CM | POA: Insufficient documentation

## 2015-06-21 DIAGNOSIS — I4891 Unspecified atrial fibrillation: Secondary | ICD-10-CM | POA: Insufficient documentation

## 2015-06-21 DIAGNOSIS — R0789 Other chest pain: Secondary | ICD-10-CM | POA: Insufficient documentation

## 2015-06-21 DIAGNOSIS — Z720 Tobacco use: Secondary | ICD-10-CM | POA: Diagnosis not present

## 2015-06-21 DIAGNOSIS — Z79899 Other long term (current) drug therapy: Secondary | ICD-10-CM | POA: Diagnosis not present

## 2015-06-21 DIAGNOSIS — Z9119 Patient's noncompliance with other medical treatment and regimen: Secondary | ICD-10-CM | POA: Insufficient documentation

## 2015-06-21 DIAGNOSIS — Z7982 Long term (current) use of aspirin: Secondary | ICD-10-CM | POA: Diagnosis not present

## 2015-06-21 DIAGNOSIS — Z794 Long term (current) use of insulin: Secondary | ICD-10-CM | POA: Diagnosis not present

## 2015-06-21 DIAGNOSIS — R079 Chest pain, unspecified: Secondary | ICD-10-CM

## 2015-06-21 DIAGNOSIS — E785 Hyperlipidemia, unspecified: Secondary | ICD-10-CM | POA: Insufficient documentation

## 2015-06-21 DIAGNOSIS — I2510 Atherosclerotic heart disease of native coronary artery without angina pectoris: Secondary | ICD-10-CM | POA: Insufficient documentation

## 2015-06-21 DIAGNOSIS — Z8739 Personal history of other diseases of the musculoskeletal system and connective tissue: Secondary | ICD-10-CM | POA: Diagnosis not present

## 2015-06-21 DIAGNOSIS — I5022 Chronic systolic (congestive) heart failure: Secondary | ICD-10-CM | POA: Insufficient documentation

## 2015-06-21 LAB — COMPREHENSIVE METABOLIC PANEL
ALT: 21 U/L (ref 17–63)
ANION GAP: 9 (ref 5–15)
AST: 16 U/L (ref 15–41)
Albumin: 4.2 g/dL (ref 3.5–5.0)
Alkaline Phosphatase: 70 U/L (ref 38–126)
BUN: 21 mg/dL — AB (ref 6–20)
CALCIUM: 9 mg/dL (ref 8.9–10.3)
CO2: 24 mmol/L (ref 22–32)
Chloride: 104 mmol/L (ref 101–111)
Creatinine, Ser: 1.44 mg/dL — ABNORMAL HIGH (ref 0.61–1.24)
GFR calc non Af Amer: 48 mL/min — ABNORMAL LOW (ref >60)
GFR, EST AFRICAN AMERICAN: 56 mL/min — AB (ref >60)
GLUCOSE: 204 mg/dL — AB (ref 65–99)
Potassium: 4.4 mmol/L (ref 3.5–5.1)
SODIUM: 137 mmol/L (ref 135–145)
TOTAL PROTEIN: 7.4 g/dL (ref 6.5–8.1)
Total Bilirubin: 0.6 mg/dL (ref 0.3–1.2)

## 2015-06-21 LAB — CBC WITH DIFFERENTIAL/PLATELET
Basophils Absolute: 0.1 10*3/uL (ref 0.0–0.1)
Basophils Relative: 1 % (ref 0–1)
EOS ABS: 0.2 10*3/uL (ref 0.0–0.7)
EOS PCT: 2 % (ref 0–5)
HEMATOCRIT: 42.4 % (ref 39.0–52.0)
Hemoglobin: 14.3 g/dL (ref 13.0–17.0)
Lymphocytes Relative: 25 % (ref 12–46)
Lymphs Abs: 2.4 10*3/uL (ref 0.7–4.0)
MCH: 31.5 pg (ref 26.0–34.0)
MCHC: 33.7 g/dL (ref 30.0–36.0)
MCV: 93.4 fL (ref 78.0–100.0)
MONO ABS: 0.7 10*3/uL (ref 0.1–1.0)
MONOS PCT: 7 % (ref 3–12)
NEUTROS PCT: 65 % (ref 43–77)
Neutro Abs: 6.4 10*3/uL (ref 1.7–7.7)
PLATELETS: 178 10*3/uL (ref 150–400)
RBC: 4.54 MIL/uL (ref 4.22–5.81)
RDW: 13.1 % (ref 11.5–15.5)
WBC: 9.9 10*3/uL (ref 4.0–10.5)

## 2015-06-21 LAB — PROTIME-INR
INR: 1.61 — ABNORMAL HIGH (ref 0.00–1.49)
Prothrombin Time: 19.2 seconds — ABNORMAL HIGH (ref 11.6–15.2)

## 2015-06-21 LAB — TROPONIN I
Troponin I: <0.03 ng/mL (ref <0.031)
Troponin I: <0.03 ng/mL (ref <0.031)

## 2015-06-21 MED ORDER — NITROGLYCERIN 0.4 MG SL SUBL: 0.4000 mg | SUBLINGUAL_TABLET | SUBLINGUAL | Status: DC | PRN

## 2015-06-21 MED ORDER — ASPIRIN 81 MG PO CHEW: 324.0000 mg | CHEWABLE_TABLET | Freq: Once | ORAL | Status: DC

## 2015-06-21 NOTE — ED Notes (Signed)
Patient with c/o central chest pain that started today about 1600. Seen Sunday for same complaints. Took 325 mg ASA PTA

## 2015-06-21 NOTE — ED Notes (Signed)
Patient denying chest pain at this time. Requested something to eat. Patient given dinner tray.

## 2015-06-21 NOTE — ED Provider Notes (Signed)
Patient signed out to me by Dr. Manus Gunningancour to follow-up on troponin. Patient had been seen for chest pain and it was arranged with his cardiologist to have a second troponin. If negative patient was to be sent home. Second troponin is negative and patient will be discharge, follow-up with his cardiologist.  Gilda Creasehristopher J Eann Cleland, MD 06/21/15 2251

## 2015-06-21 NOTE — Discharge Instructions (Signed)
Chest Pain (Nonspecific) There is no evidence of heart attack. Follow up with Dr. Wyline MoodBranch this week. Return to the ED if you develop new or worsening symptoms. It is often hard to give a specific diagnosis for the cause of chest pain. There is always a chance that your pain could be related to something serious, such as a heart attack or a blood clot in the lungs. You need to follow up with your health care provider for further evaluation. CAUSES   Heartburn.  Pneumonia or bronchitis.  Anxiety or stress.  Inflammation around your heart (pericarditis) or lung (pleuritis or pleurisy).  A blood clot in the lung.  A collapsed lung (pneumothorax). It can develop suddenly on its own (spontaneous pneumothorax) or from trauma to the chest.  Shingles infection (herpes zoster virus). The chest wall is composed of bones, muscles, and cartilage. Any of these can be the source of the pain.  The bones can be bruised by injury.  The muscles or cartilage can be strained by coughing or overwork.  The cartilage can be affected by inflammation and become sore (costochondritis). DIAGNOSIS  Lab tests or other studies may be needed to find the cause of your pain. Your health care provider may have you take a test called an ambulatory electrocardiogram (ECG). An ECG records your heartbeat patterns over a 24-hour period. You may also have other tests, such as:  Transthoracic echocardiogram (TTE). During echocardiography, sound waves are used to evaluate how blood flows through your heart.  Transesophageal echocardiogram (TEE).  Cardiac monitoring. This allows your health care provider to monitor your heart rate and rhythm in real time.  Holter monitor. This is a portable device that records your heartbeat and can help diagnose heart arrhythmias. It allows your health care provider to track your heart activity for several days, if needed.  Stress tests by exercise or by giving medicine that makes the heart  beat faster. TREATMENT   Treatment depends on what may be causing your chest pain. Treatment may include:  Acid blockers for heartburn.  Anti-inflammatory medicine.  Pain medicine for inflammatory conditions.  Antibiotics if an infection is present.  You may be advised to change lifestyle habits. This includes stopping smoking and avoiding alcohol, caffeine, and chocolate.  You may be advised to keep your head raised (elevated) when sleeping. This reduces the chance of acid going backward from your stomach into your esophagus. Most of the time, nonspecific chest pain will improve within 2-3 days with rest and mild pain medicine.  HOME CARE INSTRUCTIONS   If antibiotics were prescribed, take them as directed. Finish them even if you start to feel better.  For the next few days, avoid physical activities that bring on chest pain. Continue physical activities as directed.  Do not use any tobacco products, including cigarettes, chewing tobacco, or electronic cigarettes.  Avoid drinking alcohol.  Only take medicine as directed by your health care provider.  Follow your health care provider's suggestions for further testing if your chest pain does not go away.  Keep any follow-up appointments you made. If you do not go to an appointment, you could develop lasting (chronic) problems with pain. If there is any problem keeping an appointment, call to reschedule. SEEK MEDICAL CARE IF:   Your chest pain does not go away, even after treatment.  You have a rash with blisters on your chest.  You have a fever. SEEK IMMEDIATE MEDICAL CARE IF:   You have increased chest pain or pain  that spreads to your arm, neck, jaw, back, or abdomen.  You have shortness of breath.  You have an increasing cough, or you cough up blood.  You have severe back or abdominal pain.  You feel nauseous or vomit.  You have severe weakness.  You faint.  You have chills. This is an emergency. Do not wait  to see if the pain will go away. Get medical help at once. Call your local emergency services (911 in [place] Do not drive yourself to the hospital. MAKE SURE YOU:   Understand these instructions.  Will watch your condition.  Will get help right away if you are not doing well or get worse. Document Released: 09/17/2005 Document Revised: 12/13/2013 Document Reviewed: 07/13/2008 [facility] Patient Information 2015 [place], [place]. This information is not intended to replace advice given to you by your health care provider. Make sure you discuss any questions you have with your health care provider.

## 2015-06-21 NOTE — ED Provider Notes (Signed)
CSN: [id number]     Arrival date & time 06/21/15  1829 History   First MD Initiated Contact with Patient 06/21/15 1837     Chief Complaint  Patient presents with  . Chest Pain     (Consider location/radiation/quality/duration/timing/severity/associated sxs/prior Treatment) HPI Comments: Patient presents for evaluation of central chest pain. He is a poor historian. He states the pain started in the center of his chest about one hour ago while resting. Nothing makes it better. It is worse with palpation. He did not take any medication for it. Patient with a history of chronic chest pain and recent admission for same. Last cath in 2014 showed occluded grafts and anatomy was not amenable to revascularization. She reports the pain is the same as it always is. He does not have any shortness of breath, nausea, vomiting, abdominal pain or back pain. He is unclear about his medications. He does not know if he is taking Ranexa or nitroglycerin.  Patient is a 69 y.o. male presenting with chest pain. The history is provided by the patient and the EMS personnel.  Chest Pain Associated symptoms: no abdominal pain, no cough, no dizziness, no fever, no headache, no nausea, no shortness of breath, not vomiting and no weakness     Past Medical History  Diagnosis Date  . Coronary atherosclerosis of native coronary artery     Ant. MI in 4/95; PTCA of 90% prox & distal LAD unsuccessful-->  Urgent CABG-4/95 TO Cx; 50% RCA; ant. HK; EF of 50-55%; 08/2008: 3-V disease plus a  95% LIMA stenosis, treated with DES; occlusion of SVG to CX; RCA SVG stenosis--> PCI with DES  2010: in-stent restenosis in the LIMA-->cutting balloon; EF of 35-40%;  07/2010: TO of SVG to     RCA-medical therapy advised; EF of 30-35% by echo in 12/2010  . Atrial fibrillation 1995    1995, 2009; bradycardia with beta blocker therapy  . Tobacco abuse     40-50 pack years; currently one pack per day  . Peripheral vascular disease 1995    Abdominal  aortic obstruction 1995-->vascular surgery  . Hyperlipidemia   . Type 2 diabetes mellitus   . DDD (degenerative disc disease)     Discectomy and fusion at L4 and L5  . Alcohol use 1999    Excessive alcohol use- quit in 1999  . Chronic systolic heart failure     LVEF 25-30% 2011  . Non-compliance    Past Surgical History  Procedure Laterality Date  . Cholecystectomy  1995  . Lumbar fusion      +Discectomy;x2; L4 and L5  . Aorto-femoral bypass graft  1995  . Left heart catheterization with coronary angiogram N/A 12/14/2013    Procedure: LEFT HEART CATHETERIZATION WITH CORONARY ANGIOGRAM;  Surgeon: Micheline Chapman, MD;  Location: [facility] CATH LAB;  Service: Cardiovascular;  Laterality: N/A;   Family History  Problem Relation Age of Onset  . CAD Father    History  Substance Use Topics  . Smoking status: Current Every Day Smoker -- 1.00 packs/day for 40 years    Types: Cigarettes  . Smokeless tobacco: Current User     Comment: refused  . Alcohol Use: No     Comment: former    Review of Systems  Constitutional: Negative for fever, activity change and appetite change.  HENT: Negative for congestion and rhinorrhea.   Respiratory: Positive for chest tightness. Negative for cough and shortness of breath.   Cardiovascular: Positive for chest pain.  Gastrointestinal:  Negative for nausea, vomiting and abdominal pain.  Genitourinary: Negative for dysuria, hematuria and testicular pain.  Musculoskeletal: Negative for myalgias and arthralgias.  Skin: Negative for wound.  Neurological: Negative for dizziness, weakness, light-headedness and headaches.  A complete 10 system review of systems was obtained and all systems are negative except as noted in the HPI and PMH.      Allergies  Review of patient's allergies indicates no known allergies.  Home Medications   Prior to Admission medications   Medication Sig Start Date End Date Taking? Authorizing Provider  albuterol (PROVENTIL  HFA;VENTOLIN HFA) 108 (90 BASE) MCG/ACT inhaler Inhale 2 puffs into the lungs every 6 (six) hours as needed for wheezing or shortness of breath.    Historical Provider, MD  aspirin EC 325 MG EC tablet Take 1 tablet (325 mg total) by mouth daily. 05/06/14   Butch Penny, MD  aspirin EC 81 MG tablet Take 81 mg by mouth daily.    Historical Provider, MD  carvedilol (COREG) 25 MG tablet TAKE ONE TABLET TWICE DAILY 12/21/14   Antoine Poche, MD  dabigatran (PRADAXA) 150 MG CAPS capsule Take 150 mg by mouth 2 (two) times daily.    Historical Provider, MD  digoxin (LANOXIN) 0.125 MG tablet Take 125 mcg by mouth daily.      Historical Provider, MD  furosemide (LASIX) 40 MG tablet Take 1 tablet (40 mg total) by mouth every other day. 11/21/14   Antoine Poche, MD  insulin detemir (LEVEMIR) 100 UNIT/ML injection Inject 30 Units into the skin every morning.     Historical Provider, MD  isosorbide mononitrate (IMDUR) 30 MG 24 hr tablet Take 1 tablet (30 mg total) by mouth daily. 09/08/13   Butch Penny, MD  lisinopril (PRINIVIL,ZESTRIL) 5 MG tablet Take 1 tablet (5 mg total) by mouth daily. 02/28/14   Antoine Poche, MD  nitroGLYCERIN (NITROSTAT) 0.4 MG SL tablet Place 1 tablet (0.4 mg total) under the tongue every 5 (five) minutes as needed. 05/24/12   Kathlen Brunswick, MD  potassium chloride SA (K-DUR,KLOR-CON) 20 MEQ tablet TAKE ONE TABLET TWICE DAILY 12/21/14   Antoine Poche, MD  ranolazine (RANEXA) 500 MG 12 hr tablet Take 1 tablet (500 mg total) by mouth 2 (two) times daily. 06/13/14   Butch Penny, MD  ranolazine (RANEXA) 500 MG 12 hr tablet Take 1 tablet (500 mg total) by mouth 2 (two) times daily. 06/13/15   Oval Linsey, MD  rosuvastatin (CRESTOR) 40 MG tablet Take 40 mg by mouth daily.    Historical Provider, MD   BP 96/48 mmHg  Pulse 71  Temp(Src) 97.9 F (36.6 C) (Oral)  Resp 20  SpO2 100% Physical Exam  Constitutional: He is oriented to person, place, and time. He appears  well-developed and well-nourished. No distress.  HENT:  Head: Normocephalic and atraumatic.  Mouth/Throat: Oropharynx is clear and moist. No oropharyngeal exudate.  Eyes: Conjunctivae and EOM are normal. Pupils are equal, round, and reactive to light.  Neck: Normal range of motion. Neck supple.  No meningismus.  Cardiovascular: Normal rate, normal heart sounds and intact distal pulses.   No murmur heard. Irregular rhythm.  Pulmonary/Chest: Effort normal and breath sounds normal. No respiratory distress. He exhibits tenderness.  L chest wall tender to palpation.  Abdominal: Soft. There is no tenderness. There is no rebound and no guarding.  Musculoskeletal: Normal range of motion. He exhibits no edema or tenderness.  Neurological: He is alert and oriented to person, place,  and time. No cranial nerve deficit. He exhibits normal muscle tone. Coordination normal.  No ataxia on finger to nose bilaterally. No pronator drift. 5/5 strength throughout. CN 2-12 intact. Negative Romberg. Equal grip strength. Sensation intact. Gait is normal.   Skin: Skin is warm.  Psychiatric: He has a normal mood and affect. His behavior is normal.  Nursing note and vitals reviewed.   ED Course  Procedures (including critical care time) Labs Review Labs Reviewed  COMPREHENSIVE METABOLIC PANEL - Abnormal; Notable for the following:    Glucose, Bld 204 (*)    BUN 21 (*)    Creatinine, Ser 1.44 (*)    GFR calc non Af Amer 48 (*)    GFR calc Af Amer 56 (*)    All other components within normal limits  PROTIME-INR - Abnormal; Notable for the following:    Prothrombin Time 19.2 (*)    INR 1.61 (*)    All other components within normal limits  CBC WITH DIFFERENTIAL/PLATELET  TROPONIN I    Imaging Review Dg Chest 2 View  06/21/2015   CLINICAL DATA:  Central chest pain for 3 weeks, initial encounter  EXAM: CHEST - 2 VIEW  COMPARISON:  06/17/2015  FINDINGS: Cardiac shadow remains mildly prominent. Postsurgical  changes are again seen. Lungs are well aerated bilaterally. No focal infiltrate or sizable effusion is noted. No acute bony abnormality is seen.  IMPRESSION: No active disease.   Electronically Signed   By: Alcide CleverMark  Lukens M.D.   On: 06/21/2015 19:54     EKG Interpretation   Date/Time:  Thursday June 21 2015 18:31:22 EDT Ventricular Rate:  71 PR Interval:    QRS Duration: 78 QT Interval:  405 QTC Calculation: 440 R Axis:   -45 Text Interpretation:  Atrial fibrillation Anterolateral infarct, age  indeterminate No significant change was found Confirmed by Manus GunningANCOUR  MD,  Evianna Chandran [phone]) on 06/21/2015 6:49:44 PM      MDM   Final diagnoses:  Chest pain, unspecified chest pain type   Recurrent chest pain in patient with chronic chest pain as well as CAD status post bypass with known occluded grafts. Atrial Fibrillation without acute ST changes. Recent cardiology notes reviewed. "last cath 11/2013 with severe 3 vessel CAD, SVG x2 chronically occluded (MPI before cath with large anteroseptal scar, moderate inferolateral scar with minimal peri-infarct ischemia, , and patent LIMA-LAD. Overall stable unchanged coronary anatomy. Previous caths describe anatomy not amenable to revascularization"  Patient's EKG is unchanged. Troponin is negative 1. His chest wall tender to palpation. Labs appear to be at baseline. Discussed with cardiology fellow Dr. Zachery ConchFriedman. He reviewed the patient's record and agrees that patient appears to have stable angina and recommends treating as appropriate with nitroglycerin. No further invasive testing was recommended by cardiology during his recent admission.  We'll obtain a second troponin. Patient states his pain is improved. Doubt PE, doubt dissection.  Patient states pain is resolved. He is eating a meal in no distress. He states he has has Ranexa and nitroglycerin at home. Second troponin pending at time of sign out to Dr. Blinda LeatherwoodPollina. Patient needs follow-up with Dr.  branch this week.  Glynn OctaveStephen Darien Kading, MD 06/21/15 2209

## 2015-06-28 ENCOUNTER — Encounter: Payer: Medicare Other | Admitting: Adult Health

## 2015-06-28 NOTE — Progress Notes (Signed)
Cardiology Office Note   Date:  06/28/2015   ID:  [name], DOB 12-Dec-1946, MRN [id number]  ERROR NO SHOW

## 2015-08-08 ENCOUNTER — Emergency Department (HOSPITAL_COMMUNITY): Payer: Medicare Other

## 2015-08-08 ENCOUNTER — Inpatient Hospital Stay (HOSPITAL_COMMUNITY)
Admission: EM | Admit: 2015-08-08 | Discharge: 2015-08-10 | DRG: 292 | Disposition: A | Discharge status: Home / Self Care | Payer: Medicare Other | Attending: Cardiology | Admitting: Cardiology

## 2015-08-08 ENCOUNTER — Encounter (HOSPITAL_COMMUNITY): Payer: Self-pay | Admitting: *Deleted

## 2015-08-08 ENCOUNTER — Other Ambulatory Visit: Payer: Self-pay | Admitting: Cardiology

## 2015-08-08 DIAGNOSIS — Z9049 Acquired absence of other specified parts of digestive tract: Secondary | ICD-10-CM | POA: Diagnosis not present

## 2015-08-08 DIAGNOSIS — I5041 Acute combined systolic (congestive) and diastolic (congestive) heart failure: Secondary | ICD-10-CM | POA: Diagnosis present

## 2015-08-08 DIAGNOSIS — I209 Angina pectoris, unspecified: Secondary | ICD-10-CM | POA: Diagnosis not present

## 2015-08-08 DIAGNOSIS — Z95820 Peripheral vascular angioplasty status with implants and grafts: Secondary | ICD-10-CM

## 2015-08-08 DIAGNOSIS — E119 Type 2 diabetes mellitus without complications: Secondary | ICD-10-CM

## 2015-08-08 DIAGNOSIS — R079 Chest pain, unspecified: Secondary | ICD-10-CM | POA: Diagnosis present

## 2015-08-08 DIAGNOSIS — Z9114 Patient's other noncompliance with medication regimen: Secondary | ICD-10-CM | POA: Diagnosis not present

## 2015-08-08 DIAGNOSIS — E1151 Type 2 diabetes mellitus with diabetic peripheral angiopathy without gangrene: Secondary | ICD-10-CM | POA: Diagnosis present

## 2015-08-08 DIAGNOSIS — Z794 Long term (current) use of insulin: Secondary | ICD-10-CM

## 2015-08-08 DIAGNOSIS — R7989 Other specified abnormal findings of blood chemistry: Secondary | ICD-10-CM | POA: Diagnosis present

## 2015-08-08 DIAGNOSIS — E782 Mixed hyperlipidemia: Secondary | ICD-10-CM | POA: Diagnosis not present

## 2015-08-08 DIAGNOSIS — Z7982 Long term (current) use of aspirin: Secondary | ICD-10-CM | POA: Diagnosis not present

## 2015-08-08 DIAGNOSIS — Z981 Arthrodesis status: Secondary | ICD-10-CM | POA: Diagnosis not present

## 2015-08-08 DIAGNOSIS — E785 Hyperlipidemia, unspecified: Secondary | ICD-10-CM | POA: Diagnosis present

## 2015-08-08 DIAGNOSIS — E1121 Type 2 diabetes mellitus with diabetic nephropathy: Secondary | ICD-10-CM | POA: Diagnosis present

## 2015-08-08 DIAGNOSIS — F17210 Nicotine dependence, cigarettes, uncomplicated: Secondary | ICD-10-CM | POA: Diagnosis present

## 2015-08-08 DIAGNOSIS — Z7901 Long term (current) use of anticoagulants: Secondary | ICD-10-CM

## 2015-08-08 DIAGNOSIS — Z8249 Family history of ischemic heart disease and other diseases of the circulatory system: Secondary | ICD-10-CM | POA: Diagnosis not present

## 2015-08-08 DIAGNOSIS — Z79899 Other long term (current) drug therapy: Secondary | ICD-10-CM

## 2015-08-08 DIAGNOSIS — I2510 Atherosclerotic heart disease of native coronary artery without angina pectoris: Secondary | ICD-10-CM | POA: Diagnosis not present

## 2015-08-08 DIAGNOSIS — I482 Chronic atrial fibrillation: Secondary | ICD-10-CM | POA: Diagnosis present

## 2015-08-08 DIAGNOSIS — R778 Other specified abnormalities of plasma proteins: Secondary | ICD-10-CM

## 2015-08-08 DIAGNOSIS — I255 Ischemic cardiomyopathy: Secondary | ICD-10-CM | POA: Diagnosis not present

## 2015-08-08 DIAGNOSIS — I25810 Atherosclerosis of coronary artery bypass graft(s) without angina pectoris: Secondary | ICD-10-CM | POA: Diagnosis present

## 2015-08-08 DIAGNOSIS — Z951 Presence of aortocoronary bypass graft: Secondary | ICD-10-CM

## 2015-08-08 DIAGNOSIS — I10 Essential (primary) hypertension: Secondary | ICD-10-CM | POA: Diagnosis not present

## 2015-08-08 DIAGNOSIS — I5043 Acute on chronic combined systolic (congestive) and diastolic (congestive) heart failure: Secondary | ICD-10-CM | POA: Diagnosis not present

## 2015-08-08 DIAGNOSIS — I4891 Unspecified atrial fibrillation: Secondary | ICD-10-CM | POA: Diagnosis present

## 2015-08-08 DIAGNOSIS — M5136 Other intervertebral disc degeneration, lumbar region: Secondary | ICD-10-CM | POA: Diagnosis present

## 2015-08-08 DIAGNOSIS — Z955 Presence of coronary angioplasty implant and graft: Secondary | ICD-10-CM | POA: Diagnosis not present

## 2015-08-08 LAB — TROPONIN I: TROPONIN I: 0.13 ng/mL — AB (ref <0.031)

## 2015-08-08 LAB — CBC
HCT: 41 % (ref 39.0–52.0)
HEMOGLOBIN: 13.7 g/dL (ref 13.0–17.0)
MCH: 31.9 pg (ref 26.0–34.0)
MCHC: 33.4 g/dL (ref 30.0–36.0)
MCV: 95.6 fL (ref 78.0–100.0)
Platelets: 166 10*3/uL (ref 150–400)
RBC: 4.29 MIL/uL (ref 4.22–5.81)
RDW: 13.5 % (ref 11.5–15.5)
WBC: 9.6 10*3/uL (ref 4.0–10.5)

## 2015-08-08 LAB — BRAIN NATRIURETIC PEPTIDE: B NATRIURETIC PEPTIDE 5: 238 pg/mL — AB (ref 0.0–100.0)

## 2015-08-08 LAB — COMPREHENSIVE METABOLIC PANEL
ALT: 18 U/L (ref 17–63)
AST: 17 U/L (ref 15–41)
Albumin: 4.2 g/dL (ref 3.5–5.0)
Alkaline Phosphatase: 58 U/L (ref 38–126)
Anion gap: 9 (ref 5–15)
BUN: 23 mg/dL — ABNORMAL HIGH (ref 6–20)
CHLORIDE: 104 mmol/L (ref 101–111)
CO2: 21 mmol/L — AB (ref 22–32)
Calcium: 8.4 mg/dL — ABNORMAL LOW (ref 8.9–10.3)
Creatinine, Ser: 1.35 mg/dL — ABNORMAL HIGH (ref 0.61–1.24)
GFR calc non Af Amer: 52 mL/min — ABNORMAL LOW (ref >60)
Glucose, Bld: 238 mg/dL — ABNORMAL HIGH (ref 65–99)
Potassium: 4.6 mmol/L (ref 3.5–5.1)
SODIUM: 134 mmol/L — AB (ref 135–145)
Total Bilirubin: 0.7 mg/dL (ref 0.3–1.2)
Total Protein: 7.3 g/dL (ref 6.5–8.1)

## 2015-08-08 LAB — GLUCOSE, CAPILLARY: GLUCOSE-CAPILLARY: 276 mg/dL — AB (ref 65–99)

## 2015-08-08 LAB — PROTIME-INR
INR: 1.49 (ref 0.00–1.49)
PROTHROMBIN TIME: 18.1 s — AB (ref 11.6–15.2)

## 2015-08-08 LAB — DIGOXIN LEVEL: Digoxin Level: 0.4 ng/mL — ABNORMAL LOW (ref 0.8–2.0)

## 2015-08-08 MED ORDER — SODIUM CHLORIDE 0.9 % IV BOLUS (SEPSIS)
500.0000 mL | Freq: Once | INTRAVENOUS | Status: AC
  Administered 2015-08-08: 500 mL via INTRAVENOUS

## 2015-08-08 MED ORDER — ASPIRIN 325 MG PO TABS
325.0000 mg | ORAL_TABLET | Freq: Once | ORAL | Status: AC
  Administered 2015-08-08: 325 mg via ORAL
  Filled 2015-08-08: qty 1

## 2015-08-08 MED ORDER — HEPARIN (PORCINE) IN NACL 100-0.45 UNIT/ML-% IJ SOLN
900.0000 [IU]/h | INTRAMUSCULAR | Status: DC
  Administered 2015-08-08: 900 [IU]/h via INTRAVENOUS
  Filled 2015-08-08: qty 250

## 2015-08-08 MED ORDER — HEPARIN BOLUS VIA INFUSION
4000.0000 [IU] | Freq: Once | INTRAVENOUS | Status: AC
  Administered 2015-08-08: 4000 [IU] via INTRAVENOUS

## 2015-08-08 MED ORDER — NITROGLYCERIN 0.4 MG SL SUBL
0.4000 mg | SUBLINGUAL_TABLET | SUBLINGUAL | Status: DC | PRN
  Administered 2015-08-08: 0.4 mg via SUBLINGUAL
  Filled 2015-08-08: qty 1

## 2015-08-08 NOTE — Progress Notes (Signed)
ANTICOAGULATION CONSULT NOTE - Initial Consult  Pharmacy Consult for Hearin Indication: chest pain/ACS  No Known Allergies  Patient Measurements: Height:  (175.3 cm) Weight: 170 lb (77.111 kg) IBW/kg (Calculated) : 70.7 Heparin Dosing Weight: 77.1 kg  Vital Signs: Temp: 97.6 F (36.4 C) (08/17 1834) Temp Source: Oral (08/17 1834) BP: 114/59 mmHg (08/17 1945) Pulse Rate: 59 (08/17 1945)  Labs:  Recent Labs  08/08/15 1836  HGB 13.7  HCT 41.0  PLT 166  CREATININE 1.35*  TROPONINI 0.13*    Estimated Creatinine Clearance: 52.4 mL/min (by C-G formula based on Cr of 1.35).   Medical History: Past Medical History  Diagnosis Date  . Coronary atherosclerosis of native coronary artery     Ant. MI in 4/95; PTCA of 90% prox & distal LAD unsuccessful-->  Urgent CABG-4/95 TO Cx; 50% RCA; ant. HK; EF of 50-55%; 08/2008: 3-V disease plus a  95% LIMA stenosis, treated with DES; occlusion of SVG to CX; RCA SVG stenosis--> PCI with DES  2010: in-stent restenosis in the LIMA-->cutting balloon; EF of 35-40%;  07/2010: TO of SVG to     RCA-medical therapy advised; EF of 30-35% by echo in 12/2010  . Atrial fibrillation 1995    1995, 2009; bradycardia with beta blocker therapy  . Tobacco abuse     40-50 pack years; currently one pack per day  . Peripheral vascular disease 1995    Abdominal aortic obstruction 1995-->vascular surgery  . Hyperlipidemia   . Type 2 diabetes mellitus   . DDD (degenerative disc disease)     Discectomy and fusion at L4 and L5  . Alcohol use 1999    Excessive alcohol use- quit in 1999  . Chronic systolic heart failure     LVEF 25-30% 2011  . Non-compliance     Assessment: 69 yo male ED patient, heparin protocol for ACS/STEMI Labs reviewed, CrCl > 30 ml/min PTA Dabigatran, last dose from all indications this morning.   Goal of Therapy:  Heparin level 0.3-0.7 units/ml Monitor platelets by anticoagulation protocol: Yes   Plan:  Give 4000 units bolus x  1 Start heparin infusion at 900 units/hr Check anti-Xa level in 6-8 hours and daily while on heparin Continue to monitor H&H and platelets   Raquel James, Jezabelle Chisolm Bennett 08/08/2015,8:24 PM

## 2015-08-08 NOTE — ED Notes (Signed)
Pt states non-radiating center chest pain along with sob began 1 hour PTA while watching TV. Pain is described as pressure.

## 2015-08-08 NOTE — ED Notes (Signed)
MD at bedside. 

## 2015-08-08 NOTE — ED Notes (Signed)
poratable cxr changed to 2 view per MD order.

## 2015-08-08 NOTE — ED Notes (Signed)
Assumed care of patient from [place], [place]. Pt awaiting studies at this time. No distress. Pain has eased. VSS.

## 2015-08-08 NOTE — ED Provider Notes (Signed)
CSN: [id number]     Arrival date & time 08/08/15  1824 History   First MD Initiated Contact with Patient 08/08/15 1832     Chief Complaint  Patient presents with  . Chest Pain     (Consider location/radiation/quality/duration/timing/severity/associated sxs/prior Treatment) Patient is a 69 y.o. male presenting with chest pain. The history is provided by the patient.  Chest Pain Pain location:  Substernal area Pain quality: pressure   Pain radiates to:  Does not radiate Pain radiates to the back: no   Pain severity:  Moderate Onset quality:  Sudden Duration:  2 hours Timing:  Constant Progression:  Unchanged Chronicity:  Recurrent Relieved by:  Nothing Worsened by:  Nothing tried Ineffective treatments:  None tried Associated symptoms: shortness of breath   Associated symptoms: no abdominal pain, no cough, no fever, no headache, no nausea, no palpitations and not vomiting   Risk factors: coronary artery disease, diabetes mellitus, high cholesterol, hypertension, male sex and smoking    69 yo M with a history of diffuse coronary disease that is not amenable to stenting, CABG who comes in with a chief complaint of chest pain. This pain is just like his prior coronary disease. Patient is on max medical management at home. States that he has been taking his Ranexa. Pain started a couple hours ago while he was watching TV. Says it was shortness of breath. Shortness of breath is completely resolved now has a dull ache in his chest. Feels similar to prior MIs. Did not try any additional medicine while at home.  Denies cough fevers chills. Past Medical History  Diagnosis Date  . Coronary atherosclerosis of native coronary artery     Ant. MI in 4/95; PTCA of 90% prox & distal LAD unsuccessful-->  Urgent CABG-4/95 TO Cx; 50% RCA; ant. HK; EF of 50-55%; 08/2008: 3-V disease plus a  95% LIMA stenosis, treated with DES; occlusion of SVG to CX; RCA SVG stenosis--> PCI with DES  2010: in-stent  restenosis in the LIMA-->cutting balloon; EF of 35-40%;  07/2010: TO of SVG to     RCA-medical therapy advised; EF of 30-35% by echo in 12/2010  . Atrial fibrillation 1995    1995, 2009; bradycardia with beta blocker therapy  . Tobacco abuse     40-50 pack years; currently one pack per day  . Peripheral vascular disease 1995    Abdominal aortic obstruction 1995-->vascular surgery  . Hyperlipidemia   . Type 2 diabetes mellitus   . DDD (degenerative disc disease)     Discectomy and fusion at L4 and L5  . Alcohol use 1999    Excessive alcohol use- quit in 1999  . Chronic systolic heart failure     LVEF 25-30% 2011  . Non-compliance    Past Surgical History  Procedure Laterality Date  . Cholecystectomy  1995  . Lumbar fusion      +Discectomy;x2; L4 and L5  . Aorto-femoral bypass graft  1995  . Left heart catheterization with coronary angiogram N/A 12/14/2013    Procedure: LEFT HEART CATHETERIZATION WITH CORONARY ANGIOGRAM;  Surgeon: Micheline Chapman, MD;  Location: [facility] CATH LAB;  Service: Cardiovascular;  Laterality: N/A;   Family History  Problem Relation Age of Onset  . CAD Father    Social History  Substance Use Topics  . Smoking status: Current Every Day Smoker -- 1.00 packs/day for 40 years    Types: Cigarettes  . Smokeless tobacco: Current User     Comment: refused  .  Alcohol Use: No     Comment: former    Review of Systems  Constitutional: Negative for fever and chills.  HENT: Negative for congestion and facial swelling.   Eyes: Negative for discharge and visual disturbance.  Respiratory: Positive for shortness of breath. Negative for cough.   Cardiovascular: Negative for chest pain and palpitations.  Gastrointestinal: Negative for nausea, vomiting, abdominal pain and diarrhea.  Musculoskeletal: Negative for myalgias and arthralgias.  Skin: Negative for color change and rash.  Neurological: Negative for tremors, syncope and headaches.  Psychiatric/Behavioral:  Negative for confusion and dysphoric mood.      Allergies  Review of patient's allergies indicates no known allergies.  Home Medications   Prior to Admission medications   Medication Sig Start Date End Date Taking? Authorizing Provider  albuterol (PROVENTIL HFA;VENTOLIN HFA) 108 (90 BASE) MCG/ACT inhaler Inhale 2 puffs into the lungs every 6 (six) hours as needed for wheezing or shortness of breath.   Yes Historical Provider, MD  aspirin EC 325 MG EC tablet Take 1 tablet (325 mg total) by mouth daily. 05/06/14  Yes Angus [name], MD  aspirin EC 81 MG tablet Take 81 mg by mouth daily.   Yes Historical Provider, MD  carvedilol (COREG) 25 MG tablet TAKE ONE TABLET TWICE DAILY 12/21/14  Yes Antoine Poche, MD  dabigatran (PRADAXA) 150 MG CAPS capsule Take 150 mg by mouth 2 (two) times daily.   Yes Historical Provider, MD  digoxin (LANOXIN) 0.125 MG tablet Take 125 mcg by mouth daily.     Yes Historical Provider, MD  furosemide (LASIX) 40 MG tablet Take 1 tablet (40 mg total) by mouth every other day. 11/21/14  Yes Antoine Poche, MD  insulin detemir (LEVEMIR) 100 UNIT/ML injection Inject 30 Units into the skin every morning.    Yes Historical Provider, MD  isosorbide mononitrate (IMDUR) 30 MG 24 hr tablet Take 1 tablet (30 mg total) by mouth daily. 09/08/13  Yes Angus [name], MD  lisinopril (PRINIVIL,ZESTRIL) 5 MG tablet Take 1 tablet (5 mg total) by mouth daily. 02/28/14  Yes Antoine Poche, MD  nitroGLYCERIN (NITROSTAT) 0.4 MG SL tablet Place 1 tablet (0.4 mg total) under the tongue every 5 (five) minutes as needed. 05/24/12  Yes Kathlen Brunswick, MD  potassium chloride SA (K-DUR,KLOR-CON) 20 MEQ tablet TAKE ONE TABLET TWICE DAILY 08/08/15  Yes Antoine Poche, MD  ranolazine (RANEXA) 500 MG 12 hr tablet Take 1 tablet (500 mg total) by mouth 2 (two) times daily. 06/13/14  Yes Angus [name], MD  ranolazine (RANEXA) 500 MG 12 hr tablet Take 1 tablet (500 mg total) by mouth 2 (two) times  daily. 06/13/15  Yes Oval Linsey, MD  rosuvastatin (CRESTOR) 40 MG tablet Take 40 mg by mouth daily.   Yes Historical Provider, MD   BP 101/63 mmHg  Pulse 57  Temp(Src) 97.6 F (36.4 C) (Oral)  Resp 16  Ht 5\' 9"  (1.753 m)  Wt 170 lb (77.111 kg)  BMI 25.09 kg/m2  SpO2 100% Physical Exam  Constitutional: He is oriented to person, place, and time. He appears well-developed and well-nourished.  Filthy, unkept  HENT:  Head: Normocephalic and atraumatic.  Eyes: EOM are normal. Pupils are equal, round, and reactive to light.  Neck: Normal range of motion. Neck supple. No JVD present.  Cardiovascular: Normal rate and intact distal pulses.  An irregularly irregular rhythm present. Exam reveals no gallop and no friction rub.   No murmur heard. Pulmonary/Chest: No respiratory distress.  He has no wheezes.  Abdominal: He exhibits no distension. There is no tenderness. There is no rebound and no guarding.  Musculoskeletal: Normal range of motion. He exhibits no edema or tenderness.  Neurological: He is alert and oriented to person, place, and time.  Skin: No rash noted. No pallor.  Psychiatric: He has a normal mood and affect. His behavior is normal.    ED Course  Procedures (including critical care time) Labs Review Labs Reviewed  TROPONIN I - Abnormal; Notable for the following:    Troponin I 0.13 (*)    All other components within normal limits  COMPREHENSIVE METABOLIC PANEL - Abnormal; Notable for the following:    Sodium 134 (*)    CO2 21 (*)    Glucose, Bld 238 (*)    BUN 23 (*)    Creatinine, Ser 1.35 (*)    Calcium 8.4 (*)    GFR calc non Af Amer 52 (*)    All other components within normal limits  DIGOXIN LEVEL - Abnormal; Notable for the following:    Digoxin Level 0.4 (*)    All other components within normal limits  BRAIN NATRIURETIC PEPTIDE - Abnormal; Notable for the following:    B Natriuretic Peptide 238.0 (*)    All other components within normal limits   PROTIME-INR - Abnormal; Notable for the following:    Prothrombin Time 18.1 (*)    All other components within normal limits  CBC  HEPARIN LEVEL (UNFRACTIONATED)    Imaging Review Dg Chest 2 View  08/08/2015   CLINICAL DATA:  Acute onset of generalized chest pain and pressure. Shortness of breath. Initial encounter.  EXAM: CHEST  2 VIEW  COMPARISON:  Chest radiograph performed 06/21/2015  FINDINGS: The lungs are well-aerated. Mild vascular congestion is noted. There is no evidence of focal opacification, pleural effusion or pneumothorax.  The heart is borderline enlarged. The patient is status post median sternotomy. No acute osseous abnormalities are seen.  IMPRESSION: Mild vascular congestion and borderline cardiomegaly. Lungs remain grossly clear.   Electronically Signed   By: Roanna Raider M.D.   On: 08/08/2015 21:01   I have personally reviewed and evaluated these images and lab results as part of my medical decision-making.   EKG Interpretation None      ED ECG REPORT   Date: 08/08/2015  Rate: 69  Rhythm: atrial fibrillation  QRS Axis: indeterminate  Intervals: QT prolonged  ST/T Wave abnormalities: nonspecific ST changes  Conduction Disutrbances:left bundle branch block  Narrative Interpretation:   Old EKG Reviewed: unchanged  I have personally reviewed the EKG tracing and agree with the computerized printout as noted.   MDM   Final diagnoses:  Ischemic chest pain  Troponin I above reference range    69 yo M with a chief complaint chest pain. Patient's pain significantly improved prior to arrival. Patient currently being treated with maximal medical therapy. Patient try any nitroglycerin at home prior to coming to the ED. We'll attempt to control the patient's pain. Given Asa, nitro  Initial troponin positive. Records reviewed patient without a positive troponin quite some time. Case discussed with cardiologist Dr. [name]. With patient's history of diffuse coronary  disease recommended started on a heparin drip transferred to [facility] for admission.   CRITICAL CARE Performed by: Rae Roam   Total critical care time: 35  Critical care time was exclusive of separately billable procedures and treating other patients.  Critical care was necessary to treat or prevent  imminent or life-threatening deterioration.  Critical care was time spent personally by me on the following activities: development of treatment plan with patient and/or surrogate as well as nursing, discussions with consultants, evaluation of patient's response to treatment, examination of patient, obtaining history from patient or surrogate, ordering and performing treatments and interventions, ordering and review of laboratory studies, ordering and review of radiographic studies, pulse oximetry and re-evaluation of patient's condition.    Melene Plan, DO 08/08/15 2208

## 2015-08-08 NOTE — ED Notes (Signed)
Patient continually calling asking for food. Patient had been made aware that he can not have anything at this time until EDP sees him and his CT results are back. Patient getting upset with staff.

## 2015-08-09 ENCOUNTER — Ambulatory Visit (HOSPITAL_COMMUNITY): Payer: Medicare Other

## 2015-08-09 DIAGNOSIS — I5041 Acute combined systolic (congestive) and diastolic (congestive) heart failure: Secondary | ICD-10-CM | POA: Diagnosis present

## 2015-08-09 DIAGNOSIS — R778 Other specified abnormalities of plasma proteins: Secondary | ICD-10-CM | POA: Diagnosis present

## 2015-08-09 DIAGNOSIS — I2510 Atherosclerotic heart disease of native coronary artery without angina pectoris: Secondary | ICD-10-CM

## 2015-08-09 DIAGNOSIS — R7989 Other specified abnormal findings of blood chemistry: Secondary | ICD-10-CM

## 2015-08-09 DIAGNOSIS — I209 Angina pectoris, unspecified: Secondary | ICD-10-CM | POA: Diagnosis present

## 2015-08-09 DIAGNOSIS — R079 Chest pain, unspecified: Secondary | ICD-10-CM

## 2015-08-09 DIAGNOSIS — I482 Chronic atrial fibrillation: Secondary | ICD-10-CM

## 2015-08-09 LAB — PROTIME-INR
INR: 1.3 (ref 0.00–1.49)
Prothrombin Time: 16.3 seconds — ABNORMAL HIGH (ref 11.6–15.2)

## 2015-08-09 LAB — TROPONIN I
TROPONIN I: 0.09 ng/mL — AB (ref <0.031)
Troponin I: 0.07 ng/mL — ABNORMAL HIGH (ref <0.031)
Troponin I: 0.07 ng/mL — ABNORMAL HIGH (ref <0.031)

## 2015-08-09 LAB — BASIC METABOLIC PANEL
Anion gap: 7 (ref 5–15)
BUN: 15 mg/dL (ref 6–20)
CHLORIDE: 106 mmol/L (ref 101–111)
CO2: 23 mmol/L (ref 22–32)
CREATININE: 1.29 mg/dL — AB (ref 0.61–1.24)
Calcium: 8.6 mg/dL — ABNORMAL LOW (ref 8.9–10.3)
GFR calc non Af Amer: 55 mL/min — ABNORMAL LOW (ref >60)
Glucose, Bld: 202 mg/dL — ABNORMAL HIGH (ref 65–99)
Potassium: 4.4 mmol/L (ref 3.5–5.1)
Sodium: 136 mmol/L (ref 135–145)

## 2015-08-09 LAB — COMPREHENSIVE METABOLIC PANEL
ALK PHOS: 55 U/L (ref 38–126)
ALT: 20 U/L (ref 17–63)
AST: 30 U/L (ref 15–41)
Albumin: 3.6 g/dL (ref 3.5–5.0)
Anion gap: 6 (ref 5–15)
BILIRUBIN TOTAL: 0.9 mg/dL (ref 0.3–1.2)
BUN: 19 mg/dL (ref 6–20)
CALCIUM: 8.4 mg/dL — AB (ref 8.9–10.3)
CO2: 21 mmol/L — ABNORMAL LOW (ref 22–32)
CREATININE: 1.33 mg/dL — AB (ref 0.61–1.24)
Chloride: 111 mmol/L (ref 101–111)
GFR calc Af Amer: >60 mL/min (ref >60)
GFR, EST NON AFRICAN AMERICAN: 53 mL/min — AB (ref >60)
GLUCOSE: 168 mg/dL — AB (ref 65–99)
POTASSIUM: 5.4 mmol/L — AB (ref 3.5–5.1)
Sodium: 138 mmol/L (ref 135–145)
TOTAL PROTEIN: 6.4 g/dL — AB (ref 6.5–8.1)

## 2015-08-09 LAB — GLUCOSE, CAPILLARY
Glucose-Capillary: 172 mg/dL — ABNORMAL HIGH (ref 65–99)
Glucose-Capillary: 321 mg/dL — ABNORMAL HIGH (ref 65–99)
Glucose-Capillary: 168 mg/dL — ABNORMAL HIGH (ref 65–99)
Glucose-Capillary: 242 mg/dL — ABNORMAL HIGH (ref 65–99)

## 2015-08-09 LAB — CBC
HEMATOCRIT: 42.6 % (ref 39.0–52.0)
HEMOGLOBIN: 14 g/dL (ref 13.0–17.0)
MCH: 31.7 pg (ref 26.0–34.0)
MCHC: 32.9 g/dL (ref 30.0–36.0)
MCV: 96.6 fL (ref 78.0–100.0)
Platelets: 156 10*3/uL (ref 150–400)
RBC: 4.41 MIL/uL (ref 4.22–5.81)
RDW: 13.4 % (ref 11.5–15.5)
WBC: 8.7 10*3/uL (ref 4.0–10.5)

## 2015-08-09 LAB — TSH: TSH: 1.884 u[IU]/mL (ref 0.350–4.500)

## 2015-08-09 LAB — HEPARIN LEVEL (UNFRACTIONATED): HEPARIN UNFRACTIONATED: 0.22 [IU]/mL — AB (ref 0.30–0.70)

## 2015-08-09 MED ORDER — RANOLAZINE ER 500 MG PO TB12: 500.0000 mg | ORAL_TABLET | Freq: Two times a day (BID) | ORAL | Status: DC

## 2015-08-09 MED ORDER — FUROSEMIDE 40 MG PO TABS
40.0000 mg | ORAL_TABLET | Freq: Every day | ORAL | Status: DC
  Administered 2015-08-10: 40 mg via ORAL
  Filled 2015-08-09: qty 1

## 2015-08-09 MED ORDER — HEPARIN (PORCINE) IN NACL 100-0.45 UNIT/ML-% IJ SOLN
INTRAMUSCULAR | Status: AC
  Administered 2015-08-09: 25000 [IU]
  Filled 2015-08-09: qty 250

## 2015-08-09 MED ORDER — ZOLPIDEM TARTRATE 5 MG PO TABS: 5.0000 mg | ORAL_TABLET | Freq: Every evening | ORAL | Status: DC | PRN

## 2015-08-09 MED ORDER — LISINOPRIL 5 MG PO TABS
5.0000 mg | ORAL_TABLET | Freq: Every day | ORAL | Status: DC
  Administered 2015-08-09 – 2015-08-10 (×2): 5 mg via ORAL
  Filled 2015-08-09 (×2): qty 1

## 2015-08-09 MED ORDER — INSULIN ASPART 100 UNIT/ML ~~LOC~~ SOLN
0.0000 [IU] | Freq: Three times a day (TID) | SUBCUTANEOUS | Status: DC
  Administered 2015-08-09: 7 [IU] via SUBCUTANEOUS
  Administered 2015-08-09 – 2015-08-10 (×3): 2 [IU] via SUBCUTANEOUS
  Administered 2015-08-10: 1 [IU] via SUBCUTANEOUS

## 2015-08-09 MED ORDER — CLOPIDOGREL BISULFATE 75 MG PO TABS
75.0000 mg | ORAL_TABLET | Freq: Every day | ORAL | Status: DC
  Administered 2015-08-09 – 2015-08-10 (×2): 75 mg via ORAL
  Filled 2015-08-09 (×2): qty 1

## 2015-08-09 MED ORDER — INSULIN ASPART 100 UNIT/ML ~~LOC~~ SOLN
0.0000 [IU] | Freq: Every day | SUBCUTANEOUS | Status: DC
  Administered 2015-08-09: 2 [IU] via SUBCUTANEOUS

## 2015-08-09 MED ORDER — ROSUVASTATIN CALCIUM 10 MG PO TABS
40.0000 mg | ORAL_TABLET | Freq: Every day | ORAL | Status: DC
  Administered 2015-08-09 – 2015-08-10 (×2): 40 mg via ORAL
  Filled 2015-08-09 (×2): qty 4

## 2015-08-09 MED ORDER — FUROSEMIDE 10 MG/ML IJ SOLN
40.0000 mg | Freq: Four times a day (QID) | INTRAMUSCULAR | Status: AC
  Administered 2015-08-09 (×2): 40 mg via INTRAVENOUS
  Filled 2015-08-09 (×2): qty 4

## 2015-08-09 MED ORDER — RANOLAZINE ER 500 MG PO TB12
1000.0000 mg | ORAL_TABLET | Freq: Two times a day (BID) | ORAL | Status: DC
  Administered 2015-08-09 – 2015-08-10 (×3): 1000 mg via ORAL
  Filled 2015-08-09 (×3): qty 2

## 2015-08-09 MED ORDER — ALBUTEROL SULFATE (2.5 MG/3ML) 0.083% IN NEBU: 3.0000 mL | INHALATION_SOLUTION | Freq: Four times a day (QID) | RESPIRATORY_TRACT | Status: DC | PRN

## 2015-08-09 MED ORDER — ASPIRIN EC 81 MG PO TBEC: 81.0000 mg | DELAYED_RELEASE_TABLET | Freq: Every day | ORAL | Status: DC

## 2015-08-09 MED ORDER — DABIGATRAN ETEXILATE MESYLATE 150 MG PO CAPS
150.0000 mg | ORAL_CAPSULE | Freq: Two times a day (BID) | ORAL | Status: DC
  Administered 2015-08-09 – 2015-08-10 (×3): 150 mg via ORAL
  Filled 2015-08-09 (×5): qty 1

## 2015-08-09 MED ORDER — INSULIN DETEMIR 100 UNIT/ML ~~LOC~~ SOLN
30.0000 [IU] | Freq: Every morning | SUBCUTANEOUS | Status: DC
Start: 2015-08-09 — End: 2015-08-10
  Administered 2015-08-09 – 2015-08-10 (×2): 30 [IU] via SUBCUTANEOUS
  Filled 2015-08-09 (×2): qty 0.3

## 2015-08-09 MED ORDER — SORBITOL 70 % SOLN: 30.0000 mL | Freq: Every day | Status: DC | PRN

## 2015-08-09 MED ORDER — CARVEDILOL 3.125 MG PO TABS
3.1250 mg | ORAL_TABLET | Freq: Two times a day (BID) | ORAL | Status: DC
  Administered 2015-08-09 (×2): 3.125 mg via ORAL
  Filled 2015-08-09 (×3): qty 1

## 2015-08-09 MED ORDER — POTASSIUM CHLORIDE CRYS ER 20 MEQ PO TBCR: 20.0000 meq | EXTENDED_RELEASE_TABLET | Freq: Two times a day (BID) | ORAL | Status: DC

## 2015-08-09 MED ORDER — SODIUM CHLORIDE 0.9 % IJ SOLN
3.0000 mL | Freq: Two times a day (BID) | INTRAMUSCULAR | Status: DC
  Administered 2015-08-10: 3 mL via INTRAVENOUS

## 2015-08-09 MED ORDER — ISOSORBIDE MONONITRATE ER 30 MG PO TB24
30.0000 mg | ORAL_TABLET | Freq: Every day | ORAL | Status: DC
  Administered 2015-08-09 – 2015-08-10 (×2): 30 mg via ORAL
  Filled 2015-08-09 (×2): qty 1

## 2015-08-09 MED ORDER — DIGOXIN 125 MCG PO TABS
125.0000 ug | ORAL_TABLET | Freq: Every day | ORAL | Status: DC
  Administered 2015-08-09 – 2015-08-10 (×2): 125 ug via ORAL
  Filled 2015-08-09 (×2): qty 1

## 2015-08-09 MED ORDER — OXYCODONE HCL 5 MG PO TABS: 5.0000 mg | ORAL_TABLET | ORAL | Status: DC | PRN

## 2015-08-09 NOTE — Progress Notes (Signed)
69 y.o.Caucasian male with prior known history of CAD s/p CABG with known occluded SVG to RCA and SVG to OM (med mx) w/ISR of LIMA to LAD managed with cutting balloon in 2010, chronic systolic ischemic cardiomyopathy LVEF 40% (2015), peripheral vascular disease, tobacco abuse, alcohol abuse, hypertenision, diabetes, hyperlipidemia, medication non compliance.  Now admitted with chest pain, similar to previous MI.  Permanent A fib-rate controlled- dates back years, continued pradaxa for stroke prevention. CHADS2Vasc score is 4.     Subjective: No further chest pain or SOB.  Objective: Vital signs in last 24 hours: Temp:  [97.6 F (36.4 C)-98.5 F (36.9 C)] 98.5 F (36.9 C) (08/18 0529) Pulse Rate:  [51-78] 51 (08/18 0529) Resp:  [15-19] 18 (08/18 0529) BP: (101-134)/(47-93) 126/60 mmHg (08/18 0529) SpO2:  [98 %-100 %] 100 % (08/18 0529) Weight:  [170 lb (77.111 kg)-173 lb (78.472 kg)] 173 lb (78.472 kg) (08/18 0529) Weight change:  Last BM Date: 08/08/15 Intake/Output from previous day: 08/17 0701 - 08/18 0700 In: 280 [P.O.:280] Out: 1075 [Urine:1075] Intake/Output this shift: Total I/O In: -  Out: 275 [Urine:275]  PE: General:Pleasant affect, NAD Skin:Warm and dry, brisk capillary refill HEENT:normocephalic, sclera clear, mucus membranes moist Neck:supple, no JVD,  Heart:irreg irreg without murmur, gallup, rub or click Lungs:clear without rales, rhonchi, or wheezes GNF:AOZH, non tender, + BS, do not palpate liver spleen or masses Ext:no lower ext edema, 2+ pedal pulses, 2+ radial pulses Neuro:alert and oriented X 3, MAE, follows commands, + facial symmetry Tele: a fib mostly in 60s occ down to 48  EKG a fib without acute changes  Lab Results:  Recent Labs  08/08/15 1836 08/09/15 0606  WBC 9.6 8.7  HGB 13.7 14.0  HCT 41.0 42.6  PLT 166 156   BMET  Recent Labs  08/08/15 1836 08/09/15 0606  NA 134* 138  K 4.6 5.4*  CL 104 111  CO2 21* 21*  GLUCOSE  238* 168*  BUN 23* 19  CREATININE 1.35* 1.33*  CALCIUM 8.4* 8.4*    Recent Labs  08/08/15 1836  TROPONINI 0.13*    Lab Results  Component Value Date   CHOL 191 12/11/2013   HDL 35* 12/11/2013   LDLCALC 128* 12/11/2013   TRIG 142 12/11/2013   CHOLHDL 5.5 12/11/2013   Lab Results  Component Value Date   HGBA1C 9.4* 04/05/2014     Lab Results  Component Value Date   TSH 1.150 05/04/2014    Hepatic Function Panel  Recent Labs  08/09/15 0606  PROT 6.4*  ALBUMIN 3.6  AST 30  ALT 20  ALKPHOS 55  BILITOT 0.9   No results for input(s): CHOL in the last 72 hours. No results for input(s): PROTIME in the last 72 hours.     Studies/Results: Dg Chest 2 View  08/08/2015   CLINICAL DATA:  Acute onset of generalized chest pain and pressure. Shortness of breath. Initial encounter.  EXAM: CHEST  2 VIEW  COMPARISON:  Chest radiograph performed 06/21/2015  FINDINGS: The lungs are well-aerated. Mild vascular congestion is noted. There is no evidence of focal opacification, pleural effusion or pneumothorax.  The heart is borderline enlarged. The patient is status post median sternotomy. No acute osseous abnormalities are seen.  IMPRESSION: Mild vascular congestion and borderline cardiomegaly. Lungs remain grossly clear.   Electronically Signed   By: Roanna Raider M.D.   On: 08/08/2015 21:01    Medications: I have reviewed the patient's current medications. Scheduled Meds: .  aspirin EC  81 mg Oral Daily  . carvedilol  3.125 mg Oral BID WC  . digoxin  125 mcg Oral Daily  . furosemide  40 mg Intravenous Q6H  . insulin aspart  0-5 Units Subcutaneous QHS  . insulin aspart  0-9 Units Subcutaneous TID WC  . insulin detemir  30 Units Subcutaneous q morning - 10a  . isosorbide mononitrate  30 mg Oral Daily  . lisinopril  5 mg Oral Daily  . potassium chloride SA  20 mEq Oral BID  . ranolazine  500 mg Oral BID  . rosuvastatin  40 mg Oral Daily  . sodium chloride  3 mL Intravenous Q12H    Continuous Infusions: . heparin 900 Units/hr (08/08/15 2047)   PRN Meds:.albuterol, nitroGLYCERIN, oxyCODONE, sorbitol, zolpidem   Assessment/Plan: Principal Problem:   Acute combined systolic and diastolic congestive heart failure Active Problems:   Diabetes   Hyperlipidemia   Arteriosclerotic cardiovascular disease (ASCVD)   Atrial fibrillation   Chest pain   Troponin I above reference range   Ischemic chest pain  Chest pain, with elevated troponin 0.13 Long standing history of stable angina with several attempts at revascularization, with known occluded SVG grafts, somewhat positive stress test in 2014 with elevated troponin on this admission at 0.13. This is the second visit in the last 2 months. recheck troponin - consider cardiac catheterization, check troponin, hbA1c, lipid panel, echocardiogram (last >1 year) - treat with IV heparin, stopped afib anticoagulation, will need to wait for >1 day, continue ranexa and could uptitrate to 1 g BID. Continue high dose statin and beta blocker - he took pradaxa yesterday, so if cath is planned, should wait for 48 hours.   Acute on chronic systolic heart failure Mildly volume overloaded, Elevated BNP, CXR demonstrated congestion, has Killip class II symptoms BNP 238 - will treat with IV diuresis x 2 w/lasix 40 mg IV BID, continue ACEi, BB, statin and aspirin - negative 795 this admit no wt. change - case management consult for help with management of medications at home  Chronic atrial fibrillation Rate controlled, CHA2DS2VASc = 4 (age >63, hypertension, CHF, vascular disease, diabetes) - switched to IV heparin from oral AC - continue beta blocker  Hyperlipidemia, mixed Lipid panel ordered Continue home dose crestor  Diabetes mellitus with peripheral arterial disease, nephropathy  SSI, finger sticks No oral hypoglycemics HbA1c  Poor Health literacy There is a concern for medication non compliance. This is the second  visit of the patient to the hosptital in the last 2 months. He does not know his medication regimen. He has not been taking ranexa even though it was recommended on his last visit. Case management consult was placed for help with the medications.   Charlton Haws

## 2015-08-09 NOTE — Progress Notes (Signed)
  Echocardiogram 2D Echocardiogram has been performed.  Delcie Roch 08/09/2015, 4:35 PM

## 2015-08-09 NOTE — Progress Notes (Signed)
Patient ID: [name], male   DOB: November 25, 1946, 69 y.o.   MRN: [id number]    Subjective:  Denies SSCP, palpitations or Dyspnea   Objective:  Filed Vitals:   08/08/15 2030 08/08/15 2100 08/08/15 2221 08/09/15 0529  BP: 118/93 101/63 127/48 126/60  Pulse: 65 57 78 51  Temp:   97.9 F (36.6 C) 98.5 F (36.9 C)  TempSrc:   Oral Oral  Resp:   18 18  Height:    (1.753 m)   Weight:   78.472 kg (173 lb) 78.472 kg (173 lb)  SpO2: 98% 100% 100% 100%    Intake/Output from previous day:  Intake/Output Summary (Last 24 hours) at 08/09/15 4098 Last data filed at 08/09/15 1191  Gross per 24 hour  Intake    280 ml  Output   2225 ml  Net  -1945 ml    Physical Exam: Affect appropriate Healthy:  appears stated age HEENT: normal Neck supple with no adenopathy JVP normal no bruits no thyromegaly Lungs clear with no wheezing and good diaphragmatic motion Heart:  S1/S2 no murmur, no rub, gallop or click PMI normal Abdomen: benighn, BS positve, no tenderness, no AAA no bruit.  No HSM or HJR Distal pulses intact with no bruits No edema Neuro non-focal Skin warm and dry No muscular weakness   Lab Results: Basic Metabolic Panel:  Recent Labs  47/82/95 1836 08/09/15 0606  NA 134* 138  K 4.6 5.4*  CL 104 111  CO2 21* 21*  GLUCOSE 238* 168*  BUN 23* 19  CREATININE 1.35* 1.33*  CALCIUM 8.4* 8.4*   Liver Function Tests:  Recent Labs  08/08/15 1836 08/09/15 0606  AST 17 30  ALT 18 20  ALKPHOS 58 55  BILITOT 0.7 0.9  PROT 7.3 6.4*  ALBUMIN 4.2 3.6   CBC:  Recent Labs  08/08/15 1836 08/09/15 0606  WBC 9.6 8.7  HGB 13.7 14.0  HCT 41.0 42.6  MCV 95.6 96.6  PLT 166 156   Cardiac Enzymes:  Recent Labs  08/08/15 1836  TROPONINI 0.13*    Imaging: Dg Chest 2 View  08/08/2015   CLINICAL DATA:  Acute onset of generalized chest pain and pressure. Shortness of breath. Initial encounter.  EXAM: CHEST  2 VIEW  COMPARISON:  Chest radiograph performed  06/21/2015  FINDINGS: The lungs are well-aerated. Mild vascular congestion is noted. There is no evidence of focal opacification, pleural effusion or pneumothorax.  The heart is borderline enlarged. The patient is status post median sternotomy. No acute osseous abnormalities are seen.  IMPRESSION: Mild vascular congestion and borderline cardiomegaly. Lungs remain grossly clear.   Electronically Signed   By: Roanna Raider M.D.   On: 08/08/2015 21:01    Cardiac Studies:  ECG:   afib rate 67 low voltage  No acute ST changes    Telemetry:   afib good rate control   Echo: 2015  Mild MR EF 30-35%   Medications:   . aspirin EC  81 mg Oral Daily  . carvedilol  3.125 mg Oral BID WC  . digoxin  125 mcg Oral Daily  . furosemide  40 mg Intravenous Q6H  . insulin aspart  0-5 Units Subcutaneous QHS  . insulin aspart  0-9 Units Subcutaneous TID WC  . insulin detemir  30 Units Subcutaneous q morning - 10a  . isosorbide mononitrate  30 mg Oral Daily  . lisinopril  5 mg Oral Daily  . potassium chloride SA  20 mEq Oral  BID  . ranolazine  500 mg Oral BID  . rosuvastatin  40 mg Oral Daily  . sodium chloride  3 mL Intravenous Q12H     . heparin 900 Units/hr (08/08/15 2047)    Assessment/Plan:  CAD:  Distant CABG with known occluded SVG;s  Reviewed angio from 11/2013 done by Dr Excell Seltzer from left radial.  Native RCA has bridging collaterals to distal RCA And OM1.  LIMA patent primarily to septals that supply left to right collaterals and LM only really supplies IM.  No real options for revascularization.  Myovue would not be Helpful as collaterals would show ischemia.  Will d/c asa and start plavix.  D/C heparin and resume pradaxa for afib.  Increase rannexa to 1g bid.  Observe for 24-48 hrs And continue medical Rx  Charlton Haws 08/09/2015, 8:26 AM

## 2015-08-09 NOTE — H&P (Signed)
CARDIOLOGY INPATIENT HISTORY AND PHYSICAL EXAMINATION NOTE  Patient ID: [name] MRN: [id number], DOB/AGE: 69/10/1946   Admit date: 08/08/2015   Primary Physician: [name], MD Primary Cardiologist: Silvio Clayman MD  Reason for admission: Chest pain   HPI: This is a 69 y.o.Caucasian male with prior known history of CAD s/p CABG with known occluded SVG to RCA and SVG to OM (med mx) w/ISR of LIMA to LAD managed with cutting balloon in 2010, chronic systolic ischemic cardiomyopathy LVEF 40% (2015), peripheral vascular disease, tobacco abuse, alcohol abuse, hypertenision, diabetes, hyperlipidemia, medication non compliance.  Patient developed chest pain at 6 PM last night. Patient described chest pain as sharp in character, located in precordial/substernal region, which did not radiate, and is non positional. This pain was similar to that one he had when he had MI. It does not change with food, deep breathing or cough. Patient also has not associated SOB, leg swelling, PND, orthopnea. He did not take a NTG. At the [facility] ED, he was given 1 SL NTG and pain was relieved however patient was found to have elevated troponin and he was transferred to [facility] for further care. He was hemodynamically stable and did not have any VT/VF or NSVT. Patient does not know his medication regimen at home. He lives with his wife at home in [place]. He manages his own medications. He does not know his fluid restriction and dietary restrictions. He does not check his weights. According to him, his dry weight is 170 lbs.   Of note, patient presented with similar chest pain on 06/11/2015 at which time he was managed with medical management with addition of ranexa since patient did not remember if he was no ranexa or not. He did not have elevated troponin or EKG changes at that time.   Regarding his CAD, patient had his first acute anterior MI in 03/1994 requiring CABG s/p unsuccessful PCI. Subsequently over  the years, he has requires several dx caths last in 2011 which showed SVG to OM and SVG to RCA occluded being managed with med management with patent LIMA to LAD.   Problem List: Past Medical History  Diagnosis Date  . Coronary atherosclerosis of native coronary artery     Ant. MI in 4/95; PTCA of 90% prox & distal LAD unsuccessful-->  Urgent CABG-4/95 TO Cx; 50% RCA; ant. HK; EF of 50-55%; 08/2008: 3-V disease plus a  95% LIMA stenosis, treated with DES; occlusion of SVG to CX; RCA SVG stenosis--> PCI with DES  2010: in-stent restenosis in the LIMA-->cutting balloon; EF of 35-40%;  07/2010: TO of SVG to     RCA-medical therapy advised; EF of 30-35% by echo in 12/2010  . Atrial fibrillation 1995    1995, 2009; bradycardia with beta blocker therapy  . Tobacco abuse     40-50 pack years; currently one pack per day  . Peripheral vascular disease 1995    Abdominal aortic obstruction 1995-->vascular surgery  . Hyperlipidemia   . Type 2 diabetes mellitus   . DDD (degenerative disc disease)     Discectomy and fusion at L4 and L5  . Alcohol use 1999    Excessive alcohol use- quit in 1999  . Chronic systolic heart failure     LVEF 25-30% 2011  . Non-compliance     Past Surgical History  Procedure Laterality Date  . Cholecystectomy  1995  . Lumbar fusion      +Discectomy;x2; L4 and L5  . Aorto-femoral bypass graft  1995  . Left heart catheterization with coronary angiogram N/A 12/14/2013    Procedure: LEFT HEART CATHETERIZATION WITH CORONARY ANGIOGRAM;  Surgeon: Micheline Chapman, MD;  Location: [facility] CATH LAB;  Service: Cardiovascular;  Laterality: N/A;  . Cardiac catheterization    . Back surgery       Allergies: No Known Allergies   Home Medications Current Facility-Administered Medications  Medication Dose Route Frequency Provider Last Rate Last Dose  . albuterol (PROVENTIL) (2.5 MG/3ML) 0.083% nebulizer solution 3 mL  3 mL Inhalation Q6H PRN Joellyn Rued, MD      . aspirin EC tablet  81 mg  81 mg Oral Daily Joellyn Rued, MD      . carvedilol (COREG) tablet 3.125 mg  3.125 mg Oral BID WC Joellyn Rued, MD      . digoxin (LANOXIN) tablet 125 mcg  125 mcg Oral Daily Joellyn Rued, MD      . furosemide (LASIX) injection 40 mg  40 mg Intravenous Q6H Joellyn Rued, MD      . heparin ADULT infusion 100 units/mL (25000 units/250 mL)  900 Units/hr Intravenous Continuous Antoine Poche, MD 9 mL/hr at 08/08/15 2047 900 Units/hr at 08/08/15 2047  . insulin aspart (novoLOG) injection 0-5 Units  0-5 Units Subcutaneous QHS Joellyn Rued, MD      . insulin aspart (novoLOG) injection 0-9 Units  0-9 Units Subcutaneous TID WC Joellyn Rued, MD      . insulin detemir (LEVEMIR) injection 30 Units  30 Units Subcutaneous q morning - 10a Joellyn Rued, MD      . isosorbide mononitrate (IMDUR) 24 hr tablet 30 mg  30 mg Oral Daily Joellyn Rued, MD      . lisinopril (PRINIVIL,ZESTRIL) tablet 5 mg  5 mg Oral Daily Joellyn Rued, MD      . nitroGLYCERIN (NITROSTAT) SL tablet 0.4 mg  0.4 mg Sublingual Q5 min PRN Melene Plan, DO   0.4 mg at 08/08/15 2002  . oxyCODONE (Oxy IR/ROXICODONE) immediate release tablet 5 mg  5 mg Oral Q4H PRN Joellyn Rued, MD      . potassium chloride SA (K-DUR,KLOR-CON) CR tablet 20 mEq  20 mEq Oral BID Joellyn Rued, MD      . ranolazine (RANEXA) 12 hr tablet 500 mg  500 mg Oral BID Joellyn Rued, MD      . rosuvastatin (CRESTOR) tablet 40 mg  40 mg Oral Daily Joellyn Rued, MD      . sodium chloride 0.9 % injection 3 mL  3 mL Intravenous Q12H Joellyn Rued, MD      . sorbitol 70 % solution 30 mL  30 mL Oral Daily PRN Joellyn Rued, MD      . zolpidem (AMBIEN) tablet 5 mg  5 mg Oral QHS PRN Joellyn Rued, MD         Family History  Problem Relation Age of Onset  . CAD Father      Social History   Social History  . Marital Status: Married    Spouse Name: N/A  . Number of Children: N/A  . Years of Education: N/A   Occupational  History  . Not on file.   Social History Main Topics  . Smoking status: Current Every Day Smoker -- 1.00 packs/day for 40 years    Types: Cigarettes  . Smokeless tobacco: Current User     Comment: refused  . Alcohol  Use: No     Comment: former  . Drug Use: No  . Sexual Activity: Yes    Birth Control/ Protection: None   Other Topics Concern  . Not on file   Social History Narrative     Review of Systems: General: negative for chills, fever, night sweats or weight changes.  Cardiovascular: chest pain, negative for dyspnea on exertion, edema, orthopnea, palpitations, paroxysmal nocturnal dyspnea or shortness of breath  Dermatological: negative for rash Respiratory: negative for cough or wheezing Urologic: negative for hematuria Abdominal: negative for nausea, vomiting, diarrhea, bright red blood per rectum, melena, or hematemesis Neurologic: negative for visual changes, syncope, or dizziness Endocrine: no diabetes, no hypothyroidism Immunological: no lymph adenopathy Psych: non homicidal/suicidal  Physical Exam: Vitals: BP 127/48 mmHg  Pulse 78  Temp(Src) 97.9 F (36.6 C) (Oral)  Resp 18  Ht 5\' 9"  (1.753 m)  Wt 78.472 kg (173 lb)  BMI 25.54 kg/m2  SpO2 100% General: not in acute distress Neck: JVP mildly elevated to 10 cm of water, neck supple Heart: regular rate and rhythm, S1, S2, no murmurs  Lungs: CTAB  GI: non tender, non distended, bowel sounds present Extremities: no edema Neuro: AAO x 3  Psych: normal affect, no anxiety   Labs:   Results for orders placed or performed during the hospital encounter of 08/08/15 (from the past 24 hour(s))  CBC     Status: None   Collection Time: 08/08/15  6:36 PM  Result Value Ref Range   WBC 9.6 4.0 - 10.5 K/uL   RBC 4.29 4.22 - 5.81 MIL/uL   Hemoglobin 13.7 13.0 - 17.0 g/dL   HCT 16.1 09.6 - 04.5 %   MCV 95.6 78.0 - 100.0 fL   MCH 31.9 26.0 - 34.0 pg   MCHC 33.4 30.0 - 36.0 g/dL   RDW 40.9 81.1 - 91.4 %    Platelets 166 150 - 400 K/uL  Troponin I     Status: Abnormal   Collection Time: 08/08/15  6:36 PM  Result Value Ref Range   Troponin I 0.13 (H) <0.031 ng/mL  Comprehensive metabolic panel     Status: Abnormal   Collection Time: 08/08/15  6:36 PM  Result Value Ref Range   Sodium 134 (L) 135 - 145 mmol/L   Potassium 4.6 3.5 - 5.1 mmol/L   Chloride 104 101 - 111 mmol/L   CO2 21 (L) 22 - 32 mmol/L   Glucose, Bld 238 (H) 65 - 99 mg/dL   BUN 23 (H) 6 - 20 mg/dL   Creatinine, Ser 7.82 (H) 0.61 - 1.24 mg/dL   Calcium 8.4 (L) 8.9 - 10.3 mg/dL   Total Protein 7.3 6.5 - 8.1 g/dL   Albumin 4.2 3.5 - 5.0 g/dL   AST 17 15 - 41 U/L   ALT 18 17 - 63 U/L   Alkaline Phosphatase 58 38 - 126 U/L   Total Bilirubin 0.7 0.3 - 1.2 mg/dL   GFR calc non Af Amer 52 (L) >60 mL/min   GFR calc Af Amer >60 >60 mL/min   Anion gap 9 5 - 15  Digoxin level     Status: Abnormal   Collection Time: 08/08/15  6:36 PM  Result Value Ref Range   Digoxin Level 0.4 (L) 0.8 - 2.0 ng/mL  Brain natriuretic peptide     Status: Abnormal   Collection Time: 08/08/15  6:36 PM  Result Value Ref Range   B Natriuretic Peptide 238.0 (H) 0.0 - 100.0 pg/mL  Protime-INR     Status: Abnormal   Collection Time: 08/08/15  6:36 PM  Result Value Ref Range   Prothrombin Time 18.1 (H) 11.6 - 15.2 seconds   INR 1.49 0.00 - 1.49  Glucose, capillary     Status: Abnormal   Collection Time: 08/08/15 10:17 PM  Result Value Ref Range   Glucose-Capillary 276 (H) 65 - 99 mg/dL     Radiology/Studies: Dg Chest 2 View  08/08/2015   CLINICAL DATA:  Acute onset of generalized chest pain and pressure. Shortness of breath. Initial encounter.  EXAM: CHEST  2 VIEW  COMPARISON:  Chest radiograph performed 06/21/2015  FINDINGS: The lungs are well-aerated. Mild vascular congestion is noted. There is no evidence of focal opacification, pleural effusion or pneumothorax.  The heart is borderline enlarged. The patient is status post median sternotomy. No acute  osseous abnormalities are seen.  IMPRESSION: Mild vascular congestion and borderline cardiomegaly. Lungs remain grossly clear.   Electronically Signed   By: Roanna Raider M.D.   On: 08/08/2015 21:01    EKG: 08/08/2015 Rate controlled atrial fibrillation 69 bpm, non specific lateral T wave changes, poor R wave progression, low amplitude R wave throughout limb/precordial leads  Echo: 04/2014 Chronic atrial fibrillation, LVEF 40%, mild to moderate LVH, MAC, mild MR, LA severely dilated, RV mildly dilated with mild RV dysfunction, RA severely dilated  Nuclear stress test: 08/2013 High risk study for major cardiac events due to low ejection fraction and multiple perfusion defects. There is a large fixed anteroseptal scar. There is a moderate sized defect in the inferolateral wall with minimal myocardium at jeopardy. There were no diagnostic ST-segment abnormalities. No chest pain reported. LVEF 29%   Medical decision making:  Discussed care with the patient Discussed care with the physician on the phone Reviewed labs and imaging personally Reviewed prior records  ASSESSMENT AND PLAN:  This is a 69 y.o.Caucasian male 69 y.o. male with prior known history of CAD s/p CABG with known occluded SVG to RCA and SVG to OM (med mx) w/ISR of LIMA to LAD managed with cutting balloon in 2010, chronic systolic ischemic cardiomyopathy LVEF 40% (2015), peripheral vascular disease, tobacco abuse, alcohol abuse, hypertenision, diabetes, hyperlipidemia, medication non compliance who presented with chest pain episode with indeterminate range trop elevation and which was relieved with 1 SL NTG.   Principal Problem:   Acute combined systolic and diastolic congestive heart failure Active Problems:   Diabetes   Hyperlipidemia   Arteriosclerotic cardiovascular disease (ASCVD)   Atrial fibrillation   Chest pain   Troponin I above reference range   Ischemic chest pain   Chest pain, with elevated troponin  0.13 Long standing history of stable angina with several attempts at revascularization, with known occluded SVG grafts, somewhat positive stress test in 2014 with elevated troponin on this admission. This is the second visit in the last 2 months.  - consider cardiac catheterization, check troponin, hbA1c, lipid panel, echocardiogram (last >1 year) - treat with IV heparin, stopped afib anticoagulation, will need to wait for >1 day, continue ranexa and could uptitrate to 1 g BID. Continue high dose statin and beta blocker - he took pradaxa yesterday, so if cath is planned, should wait for 48 hours.   Acute on chronic systolic heart failure Mildly volume overloaded, Elevated BNP, CXR demonstrated congestion, has Killip class II symptoms - will treat with IV diuresis x 2 w/lasix 40 mg IV BID, continue ACEi, BB, statin and aspirin - case management consult  for help with management of medications at home  Chronic atrial fibrillation Rate controlled, CHA2DS2VASc  = 4 (age >37, hypertension, CHF, vascular disease, diabetes) - switched to IV heparin from oral AC - continue beta blocker  Hyperlipidemia, mixed Lipid panel ordered Continue home dose crestor  Diabetes mellitus with peripheral arterial disease, nephropathy  SSI, finger sticks No oral hypoglycemics HbA1c  Poor Health literacy There is a concern for medication non compliance. This is the second visit of the patient to the hosptital in the last 2 months. He does not know his medication regimen. He has not been taking ranexa even though it was recommended on his last visit. Case management consult was placed for help with the medications.    Signed, Joellyn Rued, MD MS 08/09/2015, 5:03 AM

## 2015-08-09 NOTE — Progress Notes (Signed)
ANTICOAGULATION CONSULT NOTE - Initial Consult  Pharmacy Consult for dabigatran Indication: atrial fibrillation  No Known Allergies  Patient Measurements: Height:  (175.3 cm) Weight: 173 lb (78.472 kg) IBW/kg (Calculated) : 70.7  Vital Signs: Temp: 98.5 F (36.9 C) (08/18 0529) Temp Source: Oral (08/18 0529) BP: 126/60 mmHg (08/18 0529) Pulse Rate: 51 (08/18 0529)  Labs:  Recent Labs  08/08/15 1836 08/09/15 0606 08/09/15 0730  HGB 13.7 14.0  --   HCT 41.0 42.6  --   PLT 166 156  --   LABPROT 18.1*  --  16.3*  INR 1.49  --  1.30  HEPARINUNFRC  --   --  0.22*  CREATININE 1.35* 1.33*  --   TROPONINI 0.13*  --   --     Estimated Creatinine Clearance: 53.2 mL/min (by C-G formula based on Cr of 1.33).   Medical History: Past Medical History  Diagnosis Date  . Coronary atherosclerosis of native coronary artery     Ant. MI in 4/95; PTCA of 90% prox & distal LAD unsuccessful-->  Urgent CABG-4/95 TO Cx; 50% RCA; ant. HK; EF of 50-55%; 08/2008: 3-V disease plus a  95% LIMA stenosis, treated with DES; occlusion of SVG to CX; RCA SVG stenosis--> PCI with DES  2010: in-stent restenosis in the LIMA-->cutting balloon; EF of 35-40%;  07/2010: TO of SVG to     RCA-medical therapy advised; EF of 30-35% by echo in 12/2010  . Atrial fibrillation 1995    1995, 2009; bradycardia with beta blocker therapy  . Tobacco abuse     40-50 pack years; currently one pack per day  . Peripheral vascular disease 1995    Abdominal aortic obstruction 1995-->vascular surgery  . Hyperlipidemia   . Type 2 diabetes mellitus   . DDD (degenerative disc disease)     Discectomy and fusion at L4 and L5  . Alcohol use 1999    Excessive alcohol use- quit in 1999  . Chronic systolic heart failure     LVEF 25-30% 2011  . Non-compliance    Assessment: 69 yo male being transferred from [facility] for CP  PMH: CAD s/p CABG, CM LVEF 40%, PVD, EtOH abuse, HTN, DM, HLD, AFib on dabigatran  AC: on dabigatran PTA  for a fib. Was transferred on heparin.  CV: hx of CAD, CM, AFib now with ACS. Trop 0.13. Possible cath Fri. VSS on asa81, carvedilol, digoxin, furosemide, isosorbide, lisinopril, ranolizine, rosuvastatin  Neuro: poor health literacy  Renal: Cr 1.33. K 5.4  Heme: H&H 14/42.6, Plt 156  Goal of Therapy:  Monitor platelets by anticoagulation protocol: Yes   Plan:  D/C Heparin Restart pradaxa 150 mg BID Monitor for s/sx of bleeding, renal function  Isaac Bliss, PharmD, [facility] Clinical Pharmacist Pager [phone] 08/09/2015 8:39 AM

## 2015-08-09 NOTE — Discharge Instructions (Signed)

## 2015-08-10 ENCOUNTER — Telehealth: Payer: Self-pay | Admitting: *Deleted

## 2015-08-10 LAB — BASIC METABOLIC PANEL
Anion gap: 9 (ref 5–15)
BUN: 20 mg/dL (ref 6–20)
CHLORIDE: 103 mmol/L (ref 101–111)
CO2: 22 mmol/L (ref 22–32)
Calcium: 8.4 mg/dL — ABNORMAL LOW (ref 8.9–10.3)
Creatinine, Ser: 1.41 mg/dL — ABNORMAL HIGH (ref 0.61–1.24)
GFR calc Af Amer: 58 mL/min — ABNORMAL LOW (ref >60)
GFR, EST NON AFRICAN AMERICAN: 50 mL/min — AB (ref >60)
GLUCOSE: 139 mg/dL — AB (ref 65–99)
POTASSIUM: 4.2 mmol/L (ref 3.5–5.1)
Sodium: 134 mmol/L — ABNORMAL LOW (ref 135–145)

## 2015-08-10 LAB — GLUCOSE, CAPILLARY
GLUCOSE-CAPILLARY: 188 mg/dL — AB (ref 65–99)
Glucose-Capillary: 137 mg/dL — ABNORMAL HIGH (ref 65–99)

## 2015-08-10 LAB — HEMOGLOBIN A1C
Hgb A1c MFr Bld: 7.8 % — ABNORMAL HIGH (ref 4.8–5.6)
Mean Plasma Glucose: 177 mg/dL

## 2015-08-10 MED ORDER — NITROGLYCERIN 0.4 MG SL SUBL: 0.4000 mg | SUBLINGUAL_TABLET | SUBLINGUAL | Status: AC | PRN

## 2015-08-10 MED ORDER — RANOLAZINE ER 1000 MG PO TB12: 1000.0000 mg | ORAL_TABLET | Freq: Two times a day (BID) | ORAL | Status: DC

## 2015-08-10 MED ORDER — CLOPIDOGREL BISULFATE 75 MG PO TABS: 75.0000 mg | ORAL_TABLET | Freq: Every day | ORAL | Status: DC

## 2015-08-10 MED ORDER — CARVEDILOL 3.125 MG PO TABS: 3.1250 mg | ORAL_TABLET | Freq: Two times a day (BID) | ORAL | Status: DC

## 2015-08-10 NOTE — Progress Notes (Signed)
Patient Profile: 69 y.o.Caucasian male with prior known history of CAD s/p CABG with known occluded SVG to RCA and SVG to OM (med mx) w/ISR of LIMA to LAD managed with cutting balloon in 2010, chronic systolic ischemic cardiomyopathy LVEF 40% (2015), peripheral vascular disease, tobacco abuse, alcohol abuse, hypertenision, diabetes, hyperlipidemia and medication non compliance, admitted for chest pain.    Subjective: Denies any recurrent CP overnight. No dyspnea.   Objective: Vital signs in last 24 hours: Temp:  [98 F (36.7 C)-100 F (37.8 C)] 98 F (36.7 C) (08/19 0452) Pulse Rate:  [78-83] 83 (08/18 2142) Resp:  [16-18] 16 (08/19 0452) BP: (91-111)/(43-61) 91/61 mmHg (08/19 0452) SpO2:  [98 %-100 %] 99 % (08/19 0452) Weight:  [168 lb 12.8 oz (76.567 kg)] 168 lb 12.8 oz (76.567 kg) (08/19 0452) Last BM Date: 08/08/15  Intake/Output from previous day: 08/18 0701 - 08/19 0700 In: 720 [P.O.:720] Out: 1850 [Urine:1850] Intake/Output this shift:    Medications Current Facility-Administered Medications  Medication Dose Route Frequency Provider Last Rate Last Dose  . albuterol (PROVENTIL) (2.5 MG/3ML) 0.083% nebulizer solution 3 mL  3 mL Inhalation Q6H PRN Joellyn Rued, MD      . carvedilol (COREG) tablet 3.125 mg  3.125 mg Oral BID WC Joellyn Rued, MD   3.125 mg at 08/09/15 1709  . clopidogrel (PLAVIX) tablet 75 mg  75 mg Oral Daily Leone Brand, NP   75 mg at 08/09/15 1709  . dabigatran (PRADAXA) capsule 150 mg  150 mg Oral Q12H Bertram Millard, RPH   150 mg at 08/09/15 2258  . digoxin (LANOXIN) tablet 125 mcg  125 mcg Oral Daily Joellyn Rued, MD   125 mcg at 08/09/15 0905  . furosemide (LASIX) tablet 40 mg  40 mg Oral Daily Leone Brand, NP   0 mg at 08/09/15 1633  . insulin aspart (novoLOG) injection 0-5 Units  0-5 Units Subcutaneous QHS Joellyn Rued, MD   2 Units at 08/09/15 2258  . insulin aspart (novoLOG) injection 0-9 Units  0-9 Units Subcutaneous TID WC  Joellyn Rued, MD   2 Units at 08/09/15 1710  . insulin detemir (LEVEMIR) injection 30 Units  30 Units Subcutaneous q morning - 10a Joellyn Rued, MD   30 Units at 08/09/15 0906  . isosorbide mononitrate (IMDUR) 24 hr tablet 30 mg  30 mg Oral Daily Joellyn Rued, MD   30 mg at 08/09/15 0905  . lisinopril (PRINIVIL,ZESTRIL) tablet 5 mg  5 mg Oral Daily Joellyn Rued, MD   5 mg at 08/09/15 0905  . nitroGLYCERIN (NITROSTAT) SL tablet 0.4 mg  0.4 mg Sublingual Q5 min PRN Melene Plan, DO   0.4 mg at 08/08/15 2002  . oxyCODONE (Oxy IR/ROXICODONE) immediate release tablet 5 mg  5 mg Oral Q4H PRN Joellyn Rued, MD      . ranolazine (RANEXA) 12 hr tablet 1,000 mg  1,000 mg Oral BID Wendall Stade, MD   1,000 mg at 08/09/15 2258  . rosuvastatin (CRESTOR) tablet 40 mg  40 mg Oral Daily Joellyn Rued, MD   40 mg at 08/09/15 0905  . sodium chloride 0.9 % injection 3 mL  3 mL Intravenous Q12H Joellyn Rued, MD   3 mL at 08/09/15 0912  . sorbitol 70 % solution 30 mL  30 mL Oral Daily PRN Joellyn Rued, MD      . zolpidem (AMBIEN) tablet 5 mg  5 mg Oral QHS PRN Joellyn Rued, MD        PE: General appearance: alert, cooperative and no distress Neck: no carotid bruit and no JVD Lungs: clear to auscultation bilaterally Heart: irregularly irregular rhythm, no rub and no murmur, regular rate Extremities: no LEE Pulses: 2+ and symmetric Skin: warm and dry Neurologic: Grossly normal  Lab Results:   Recent Labs  08/08/15 1836 08/09/15 0606  WBC 9.6 8.7  HGB 13.7 14.0  HCT 41.0 42.6  PLT 166 156   BMET  Recent Labs  08/09/15 0606 08/09/15 0945 08/10/15 0431  NA 138 136 134*  K 5.4* 4.4 4.2  CL 111 106 103  CO2 21* 23 22  GLUCOSE 168* 202* 139*  BUN 19 15 20   CREATININE 1.33* 1.29* 1.41*  CALCIUM 8.4* 8.6* 8.4*   PT/INR  Recent Labs  08/08/15 1836 08/09/15 0730  LABPROT 18.1* 16.3*  INR 1.49 1.30    Assessment/Plan  Principal Problem:   Acute combined systolic  and diastolic congestive heart failure Active Problems:   Diabetes   Hyperlipidemia   Arteriosclerotic cardiovascular disease (ASCVD)   Atrial fibrillation   Chest pain   Troponin I above reference range   Ischemic chest pain   1. CAD: h/o CABG. Most recent LHC in 11/2013 showed native RCA has bridging collaterals to distal RCA and OM1. LIMA was patent primarily to septals that supply left to right collaterals and LM only really supplies IM. No real options for revascularization. 2D echo yesterday actually shows improved EF now at 40-45% (previsouly 35-40%). Plan is to continue medical therapy. Now on Plavix, Coreg, Imdur, Ranexa (increaed to 1,000 mg BID), lisinopril and digoxin. No recurrent resting chest pain. Ambulate with cardiac rehab today to assess for exertional symptoms. BP and HR both stable.    2. Atrial Fibrillation: rate is well controlled. Coreg and digoxin for rate control. Pradaxa for a/c.  3. HLD: continue Crestor.  4. HTN: BP well controlled on Coreg, lisinopril and Imdur.   5. DM: Hgb A1c 7.8. On insulin as an OP. Continue management per PCP.   6. Systolic CHF: EF improved,  now at 40-45% (previsouly 35-40%). Volume status is stable. Continue BB and ACE-I therapy. Continue daily PO Lasix, low sodium diet and daily weights.    LOS: 2 days    Brittainy M. Delmer Islam 08/10/2015 7:45 AM   Doing well no angina D/C home with higher dose of Ranexxa.  Outpatient f/u with Dr [name] in [place] Exam with poor memory. Clear lungs and no signficant cardiac murmur Continue pradaxa for afib rate Well controlled  Charlton Haws

## 2015-08-10 NOTE — Progress Notes (Signed)
CARDIAC REHAB PHASE I   PRE:  Rate/Rhythm: 76 afib  BP:  Supine:   Sitting: 110/85 machine, 90/60 manual  Standing:    SaO2: 100%RA  MODE:  Ambulation: 100 ft   POST:  Rate/Rhythm: 77-82  BP:  Supine:   Sitting: 97/61  Standing:    SaO2: 100%RA 0955-1055 Pt walked 100 ft on RA with his cane and asst x 1. Wobbly. Stated he did not feel right. Denied CP or dizziness. While I was in room, lunch order taken and pt not on diabetic diet. He was offered sweet tea. Asked NT to let pt's RN know diet needs to be changed from heart healthy. Gave pt CHF booklet and asked if he weighed self every day and he said no. Discussed with pt why it is important to weigh daily and notify MD if he sees swelling or has increased SOB. He will need re enforcement. Pt stated he has never tried to quit smoking and not interested now. Did not want fake cigarette. Left tobacco cessation handout. Pt seems tired and not interested in ed at this time. Will need follow up. Would recommend PT eval as wobbly but he states does not fall at home.    Luetta Nutting, RN BSN  08/10/2015 10:59 AM

## 2015-08-10 NOTE — Care Management Note (Signed)
Case Management Note  Patient Details  Name: [name] MRN: [id number] Date of Birth: 1946-05-13  Subjective/Objective: Late Entry: Pt is aware of cost of Ranexa of $152.22.                   Action/Plan: CM did make pt aware that he can contact his insurance provider to see if he in the coverage gap. Pt is aware to call the company for further assistance. CM unable to assist due to insurance coverage. No further needs from CM at this time.    Gala Lewandowsky, RN 08/10/2015, 4:17 PM

## 2015-08-10 NOTE — Progress Notes (Signed)
Physician Discharge Summary  Patient ID: [name] MRN: [id number] DOB/AGE: 1946/05/18 69 y.o.   Primary Cardiologist: Dr. [name]  Admit date: 08/08/2015 Discharge date: 08/10/2015  Admission Diagnoses: Chest Pain  Discharge Diagnoses:  Principal Problem:   Acute combined systolic and diastolic congestive heart failure Active Problems:   Diabetes   Hyperlipidemia   Arteriosclerotic cardiovascular disease (ASCVD)   Atrial fibrillation   Chest pain   Troponin I above reference range   Ischemic chest pain   Discharged Condition: stable  Hospital Course: This is a 69 y.o.Caucasian male with a  known history of CAD s/p CABG with known occluded SVG to RCA and SVG to OM (med mx) w/ISR of LIMA to LAD managed with cutting balloon in 2010, chronic systolic ischemic cardiomyopathy LVEF 40% (2015), peripheral vascular disease, tobacco abuse, alcohol abuse, hypertenision, diabetes, hyperlipidemia and h/o medication non compliance.  He initially presented to [facility] on 08/09/15 with complaints of chest pain similar to the pain he had when he had his MI. His pain was relieved in the [facility] ED with SL NTG. However, due to an elevated troponin, he was transferred to [facility] for further care. Troponin was mildly elevated but with a flat trend (0.09-->0.07-->0.07). He was placed on IV heparin. 2D echo was obtained which actually showed improvement in LV systolic function compared to prior study in 2015. EF had improved from 35-40% to 40-45%. His previous LHC from 2014 was reviewed. This showed the native RCA having bridging collaterals to the distal RCA and OM1. The LIMA was patent primarily to septals that supplied left to right collaterals and the left main only really supplied the IM. It was felt that he had no real options for revascularization. Therefore, continued medical therapy was elected. His ASA was discontinued and he was placed on Plavix. He was continued on BB and nitrate therapy. His  Ranexa was also increased to 1,000 mg BID. IV heparin was discontinued and he was transitioned back to Pradaxa for his atrial fibrillation. He tolerated the medication adjustments well. He had no recurrent chest pain after upward titration of Ranexa. He was last seen and examined by Dr. Eden Emms who determined he was stable for discharge home. Post hospital f/u has been arranged in [place], in 2 weeks, on 08/24/15.   Consults: None  Significant Diagnostic Studies:  Cardiac Panel (last 3 results)  Recent Labs  08/09/15 0730 08/09/15 1342 08/09/15 2000  TROPONINI 0.09* 0.07* 0.07*    2D echo 08/09/15 Study Conclusions  - Left ventricle: The cavity size was normal. Wall thickness was normal. Systolic function was mildly to moderately reduced. The estimated ejection fraction was in the range of 40% to 45%. There is akinesis of the apicalanteroseptal and apical myocardium. The study is not technically sufficient to allow evaluation of LV diastolic function. - Mitral valve: Calcified annulus. - Left atrium: The atrium was mildly dilated.  Impressions:  - EF is improved when compared to prior echocardiogram.   Treatments: See Hospital Course  Discharge Exam: Blood pressure 100/50, pulse 63, temperature 98 F (36.7 C), temperature source Oral, resp. rate 16, height 5\' 9"  (1.753 m), weight 168 lb 12.8 oz (76.567 kg), SpO2 99 %.   Disposition: 01-Home or Self Care      Discharge Instructions    Diet - low sodium heart healthy    Complete by:  As directed      Increase activity slowly    Complete by:  As directed  Medication List    STOP taking these medications        aspirin 325 MG EC tablet     aspirin EC 81 MG tablet      TAKE these medications        albuterol 108 (90 BASE) MCG/ACT inhaler  Commonly known as:  PROVENTIL HFA;VENTOLIN HFA  Inhale 2 puffs into the lungs every 6 (six) hours as needed for wheezing or shortness of breath.      carvedilol 3.125 MG tablet  Commonly known as:  COREG  Take 1 tablet (3.125 mg total) by mouth 2 (two) times daily with a meal.     clopidogrel 75 MG tablet  Commonly known as:  PLAVIX  Take 1 tablet (75 mg total) by mouth daily.     digoxin 0.125 MG tablet  Commonly known as:  LANOXIN  Take 125 mcg by mouth daily.     furosemide 40 MG tablet  Commonly known as:  LASIX  Take 1 tablet (40 mg total) by mouth every other day.     insulin detemir 100 UNIT/ML injection  Commonly known as:  LEVEMIR  Inject 30 Units into the skin every morning.     isosorbide mononitrate 30 MG 24 hr tablet  Commonly known as:  IMDUR  Take 1 tablet (30 mg total) by mouth daily.     lisinopril 5 MG tablet  Commonly known as:  PRINIVIL,ZESTRIL  Take 1 tablet (5 mg total) by mouth daily.     nitroGLYCERIN 0.4 MG SL tablet  Commonly known as:  NITROSTAT  Place 1 tablet (0.4 mg total) under the tongue every 5 (five) minutes as needed.     potassium chloride SA 20 MEQ tablet  Commonly known as:  K-DUR,KLOR-CON  TAKE ONE TABLET TWICE DAILY     PRADAXA 150 MG Caps capsule  Generic drug:  dabigatran  Take 150 mg by mouth 2 (two) times daily.     ranolazine 1000 MG SR tablet  Commonly known as:  RANEXA  Take 1 tablet (1,000 mg total) by mouth 2 (two) times daily.     rosuvastatin 40 MG tablet  Commonly known as:  CRESTOR  Take 40 mg by mouth daily.       Follow-up Information    Follow up with [name], NP On 08/24/2015.   Specialties:  Nurse Practitioner, Radiology, Cardiology   Why:  1:50 pm    Contact information:   [address] [place] [address] [phone]       TIME SPENT ON DISCHARGE, INCLUDING PHYSICIAN TIME: > 30 MINUTES   Signed: SIMMONS, BRITTAINY 08/10/2015, 11:52 AM

## 2015-08-10 NOTE — Telephone Encounter (Signed)
Received a call from Tammy Pharm D at [facility]  1) Patient discharged on Plavix and Pradaxa and Tammy concerned about patient being on both Reviewed D/C summary and advised prescribing both is correct. 2) Tammy concerned with Carvedilol dose being decreased from 25 mg twice a day to 3.125 mg twice a day and that being such a decreased dose. Assured Tammy this was on the D/C summary   Advised Tammy that would forward to [place] S PA to confirm Carvedilol dose 3.125 mg twice a day correct.

## 2015-08-10 NOTE — Progress Notes (Signed)
Inpatient Diabetes Program Recommendations  AACE/ADA: New Consensus Statement on Inpatient Glycemic Control (2013)  Target Ranges:  Prepandial:   less than 140 mg/dL      Peak postprandial:   less than 180 mg/dL (1-2 hours)      Critically ill patients:  140 - 180 mg/dL   Results for EMANUAL, LAMOUNTAIN (MRN [id number]) as of 08/10/2015 10:29  Ref. Range 08/09/2015 08:58 08/09/2015 11:33 08/09/2015 16:30 08/09/2015 21:40 08/10/2015 07:44  Glucose-Capillary Latest Ref Range: 65-99 mg/dL 409 (H) 811 (H) 914 (H) 242 (H) 137 (H)   Diabetes history: DM 2 Outpatient Diabetes medications: Levemir 30 units Current orders for Inpatient glycemic control: Levemir 30 units, Novolog Sensitive + HS scale  Inpatient Diabetes Program Recommendations Correction (SSI): Consider increasing Novolog to Moderate Correction. Glucose increases around meal times.  Thanks,  Christena Deem RN, MSN, [facility] Inpatient Diabetes Coordinator Team Pager [phone] (8am-5pm)

## 2015-08-15 ENCOUNTER — Encounter (HOSPITAL_COMMUNITY): Payer: Self-pay | Admitting: Emergency Medicine

## 2015-08-15 ENCOUNTER — Emergency Department (HOSPITAL_COMMUNITY): Payer: Medicare Other

## 2015-08-15 ENCOUNTER — Inpatient Hospital Stay (HOSPITAL_COMMUNITY)
Admission: EM | Admit: 2015-08-15 | Discharge: 2015-08-17 | DRG: 313 | Disposition: A | Discharge status: Home Health | Payer: Medicare Other | Attending: Family Medicine | Admitting: Family Medicine

## 2015-08-15 DIAGNOSIS — F17210 Nicotine dependence, cigarettes, uncomplicated: Secondary | ICD-10-CM | POA: Diagnosis present

## 2015-08-15 DIAGNOSIS — I25119 Atherosclerotic heart disease of native coronary artery with unspecified angina pectoris: Secondary | ICD-10-CM | POA: Diagnosis not present

## 2015-08-15 DIAGNOSIS — I482 Chronic atrial fibrillation: Secondary | ICD-10-CM | POA: Diagnosis present

## 2015-08-15 DIAGNOSIS — I252 Old myocardial infarction: Secondary | ICD-10-CM

## 2015-08-15 DIAGNOSIS — I5022 Chronic systolic (congestive) heart failure: Secondary | ICD-10-CM | POA: Diagnosis present

## 2015-08-15 DIAGNOSIS — Z9114 Patient's other noncompliance with medication regimen: Secondary | ICD-10-CM | POA: Diagnosis present

## 2015-08-15 DIAGNOSIS — Z7902 Long term (current) use of antithrombotics/antiplatelets: Secondary | ICD-10-CM

## 2015-08-15 DIAGNOSIS — Z8249 Family history of ischemic heart disease and other diseases of the circulatory system: Secondary | ICD-10-CM

## 2015-08-15 DIAGNOSIS — Z794 Long term (current) use of insulin: Secondary | ICD-10-CM

## 2015-08-15 DIAGNOSIS — N183 Chronic kidney disease, stage 3 (moderate): Secondary | ICD-10-CM | POA: Diagnosis not present

## 2015-08-15 DIAGNOSIS — I255 Ischemic cardiomyopathy: Secondary | ICD-10-CM | POA: Diagnosis present

## 2015-08-15 DIAGNOSIS — E119 Type 2 diabetes mellitus without complications: Secondary | ICD-10-CM | POA: Diagnosis present

## 2015-08-15 DIAGNOSIS — I10 Essential (primary) hypertension: Secondary | ICD-10-CM | POA: Diagnosis present

## 2015-08-15 DIAGNOSIS — R079 Chest pain, unspecified: Secondary | ICD-10-CM | POA: Diagnosis present

## 2015-08-15 DIAGNOSIS — I208 Other forms of angina pectoris: Secondary | ICD-10-CM

## 2015-08-15 DIAGNOSIS — Z9861 Coronary angioplasty status: Secondary | ICD-10-CM | POA: Diagnosis not present

## 2015-08-15 DIAGNOSIS — I959 Hypotension, unspecified: Secondary | ICD-10-CM | POA: Diagnosis present

## 2015-08-15 DIAGNOSIS — Z7901 Long term (current) use of anticoagulants: Secondary | ICD-10-CM | POA: Diagnosis not present

## 2015-08-15 DIAGNOSIS — N19 Unspecified kidney failure: Secondary | ICD-10-CM | POA: Diagnosis present

## 2015-08-15 DIAGNOSIS — I4891 Unspecified atrial fibrillation: Secondary | ICD-10-CM | POA: Diagnosis present

## 2015-08-15 DIAGNOSIS — E785 Hyperlipidemia, unspecified: Secondary | ICD-10-CM | POA: Diagnosis present

## 2015-08-15 DIAGNOSIS — Z951 Presence of aortocoronary bypass graft: Secondary | ICD-10-CM | POA: Diagnosis not present

## 2015-08-15 DIAGNOSIS — I200 Unstable angina: Secondary | ICD-10-CM | POA: Diagnosis not present

## 2015-08-15 HISTORY — DX: Alcohol abuse, in remission: F10.11

## 2015-08-15 HISTORY — DX: Ischemic cardiomyopathy: I25.5

## 2015-08-15 LAB — BASIC METABOLIC PANEL
ANION GAP: 9 (ref 5–15)
BUN: 24 mg/dL — ABNORMAL HIGH (ref 6–20)
CHLORIDE: 102 mmol/L (ref 101–111)
CO2: 23 mmol/L (ref 22–32)
Calcium: 8.7 mg/dL — ABNORMAL LOW (ref 8.9–10.3)
Creatinine, Ser: 2.03 mg/dL — ABNORMAL HIGH (ref 0.61–1.24)
GFR calc non Af Amer: 32 mL/min — ABNORMAL LOW (ref >60)
GFR, EST AFRICAN AMERICAN: 37 mL/min — AB (ref >60)
GLUCOSE: 239 mg/dL — AB (ref 65–99)
POTASSIUM: 4.7 mmol/L (ref 3.5–5.1)
Sodium: 134 mmol/L — ABNORMAL LOW (ref 135–145)

## 2015-08-15 LAB — TROPONIN I
Troponin I: <0.03 ng/mL (ref <0.031)
Troponin I: <0.03 ng/mL (ref <0.031)

## 2015-08-15 LAB — CBC
HEMATOCRIT: 43.3 % (ref 39.0–52.0)
HEMOGLOBIN: 14.6 g/dL (ref 13.0–17.0)
MCH: 31.9 pg (ref 26.0–34.0)
MCHC: 33.7 g/dL (ref 30.0–36.0)
MCV: 94.5 fL (ref 78.0–100.0)
Platelets: 196 10*3/uL (ref 150–400)
RBC: 4.58 MIL/uL (ref 4.22–5.81)
RDW: 13 % (ref 11.5–15.5)
WBC: 9.6 10*3/uL (ref 4.0–10.5)

## 2015-08-15 LAB — MRSA PCR SCREENING: MRSA BY PCR: NEGATIVE

## 2015-08-15 LAB — PROTIME-INR
INR: 1.81 — ABNORMAL HIGH (ref 0.00–1.49)
Prothrombin Time: 21 seconds — ABNORMAL HIGH (ref 11.6–15.2)

## 2015-08-15 MED ORDER — ONDANSETRON HCL 4 MG/2ML IJ SOLN: 4.0000 mg | Freq: Four times a day (QID) | INTRAMUSCULAR | Status: DC | PRN

## 2015-08-15 MED ORDER — DIGOXIN 125 MCG PO TABS: 125.0000 ug | ORAL_TABLET | Freq: Every day | ORAL | Status: DC

## 2015-08-15 MED ORDER — ONDANSETRON HCL 4 MG/2ML IJ SOLN
4.0000 mg | Freq: Once | INTRAMUSCULAR | Status: DC
  Filled 2015-08-15: qty 2

## 2015-08-15 MED ORDER — SODIUM CHLORIDE 0.9 % IJ SOLN
3.0000 mL | Freq: Two times a day (BID) | INTRAMUSCULAR | Status: DC
  Administered 2015-08-17: 3 mL via INTRAVENOUS

## 2015-08-15 MED ORDER — ALBUTEROL SULFATE (2.5 MG/3ML) 0.083% IN NEBU: 3.0000 mL | INHALATION_SOLUTION | Freq: Four times a day (QID) | RESPIRATORY_TRACT | Status: DC | PRN

## 2015-08-15 MED ORDER — NITROGLYCERIN IN D5W 200-5 MCG/ML-% IV SOLN
5.0000 ug/min | Freq: Once | INTRAVENOUS | Status: AC
  Administered 2015-08-15: 5 ug/min via INTRAVENOUS
  Filled 2015-08-15: qty 250

## 2015-08-15 MED ORDER — CLOPIDOGREL BISULFATE 75 MG PO TABS: 75.0000 mg | ORAL_TABLET | Freq: Every day | ORAL | Status: DC

## 2015-08-15 MED ORDER — INSULIN DETEMIR 100 UNIT/ML ~~LOC~~ SOLN
30.0000 [IU] | Freq: Every morning | SUBCUTANEOUS | Status: DC
  Administered 2015-08-16 – 2015-08-17 (×2): 30 [IU] via SUBCUTANEOUS
  Filled 2015-08-15 (×5): qty 0.3

## 2015-08-15 MED ORDER — ONDANSETRON HCL 4 MG PO TABS: 4.0000 mg | ORAL_TABLET | Freq: Four times a day (QID) | ORAL | Status: DC | PRN

## 2015-08-15 MED ORDER — INSULIN ASPART 100 UNIT/ML ~~LOC~~ SOLN
0.0000 [IU] | Freq: Every day | SUBCUTANEOUS | Status: DC
  Administered 2015-08-16: 2 [IU] via SUBCUTANEOUS

## 2015-08-15 MED ORDER — DABIGATRAN ETEXILATE MESYLATE 150 MG PO CAPS
150.0000 mg | ORAL_CAPSULE | Freq: Two times a day (BID) | ORAL | Status: DC
  Administered 2015-08-15 – 2015-08-17 (×4): 150 mg via ORAL
  Filled 2015-08-15 (×10): qty 1

## 2015-08-15 MED ORDER — NITROGLYCERIN 0.4 MG SL SUBL: 0.4000 mg | SUBLINGUAL_TABLET | SUBLINGUAL | Status: DC | PRN

## 2015-08-15 MED ORDER — NITROGLYCERIN IN D5W 200-5 MCG/ML-% IV SOLN: 5.0000 ug/min | INTRAVENOUS | Status: DC

## 2015-08-15 MED ORDER — INSULIN ASPART 100 UNIT/ML ~~LOC~~ SOLN
0.0000 [IU] | Freq: Three times a day (TID) | SUBCUTANEOUS | Status: DC
  Administered 2015-08-16: 3 [IU] via SUBCUTANEOUS
  Administered 2015-08-16: 2 [IU] via SUBCUTANEOUS
  Administered 2015-08-16: 3 [IU] via SUBCUTANEOUS
  Administered 2015-08-17: 2 [IU] via SUBCUTANEOUS

## 2015-08-15 MED ORDER — ROSUVASTATIN CALCIUM 20 MG PO TABS
40.0000 mg | ORAL_TABLET | Freq: Every day | ORAL | Status: DC
  Administered 2015-08-15 – 2015-08-16 (×2): 40 mg via ORAL
  Filled 2015-08-15: qty 1
  Filled 2015-08-15: qty 2
  Filled 2015-08-15: qty 1

## 2015-08-15 MED ORDER — CARVEDILOL 3.125 MG PO TABS
3.1250 mg | ORAL_TABLET | Freq: Two times a day (BID) | ORAL | Status: DC
  Administered 2015-08-17: 3.125 mg via ORAL
  Filled 2015-08-15 (×2): qty 1

## 2015-08-15 MED ORDER — FUROSEMIDE 40 MG PO TABS: 40.0000 mg | ORAL_TABLET | ORAL | Status: DC

## 2015-08-15 MED ORDER — ALBUTEROL SULFATE HFA 108 (90 BASE) MCG/ACT IN AERS
2.0000 | INHALATION_SPRAY | Freq: Four times a day (QID) | RESPIRATORY_TRACT | Status: DC | PRN
  Filled 2015-08-15: qty 6.7

## 2015-08-15 MED ORDER — LISINOPRIL 5 MG PO TABS
5.0000 mg | ORAL_TABLET | Freq: Every day | ORAL | Status: DC
  Filled 2015-08-15: qty 1

## 2015-08-15 MED ORDER — RANOLAZINE ER 500 MG PO TB12
ORAL_TABLET | ORAL | Status: AC
  Filled 2015-08-15: qty 2

## 2015-08-15 MED ORDER — POTASSIUM CHLORIDE CRYS ER 20 MEQ PO TBCR
20.0000 meq | EXTENDED_RELEASE_TABLET | Freq: Two times a day (BID) | ORAL | Status: DC
  Administered 2015-08-15 – 2015-08-17 (×4): 20 meq via ORAL
  Filled 2015-08-15 (×4): qty 1

## 2015-08-15 MED ORDER — CLOPIDOGREL BISULFATE 75 MG PO TABS
75.0000 mg | ORAL_TABLET | Freq: Every day | ORAL | Status: DC
  Administered 2015-08-16 – 2015-08-17 (×2): 75 mg via ORAL
  Filled 2015-08-15 (×2): qty 1

## 2015-08-15 MED ORDER — RANOLAZINE ER 500 MG PO TB12
1000.0000 mg | ORAL_TABLET | Freq: Two times a day (BID) | ORAL | Status: DC
  Administered 2015-08-15 – 2015-08-17 (×4): 1000 mg via ORAL
  Filled 2015-08-15 (×6): qty 2

## 2015-08-15 MED ORDER — MORPHINE SULFATE (PF) 4 MG/ML IV SOLN: 4.0000 mg | INTRAVENOUS | Status: DC | PRN

## 2015-08-15 MED ORDER — FUROSEMIDE 40 MG PO TABS
40.0000 mg | ORAL_TABLET | ORAL | Status: DC
  Administered 2015-08-16: 40 mg via ORAL
  Filled 2015-08-15 (×2): qty 1

## 2015-08-15 MED ORDER — SODIUM CHLORIDE 0.9 % IV SOLN
INTRAVENOUS | Status: DC
  Administered 2015-08-15: 23:00:00 via INTRAVENOUS
  Administered 2015-08-16: 75 mL/h via INTRAVENOUS
  Administered 2015-08-16: 22:00:00 via INTRAVENOUS

## 2015-08-15 MED ORDER — DABIGATRAN ETEXILATE MESYLATE 150 MG PO CAPS
ORAL_CAPSULE | ORAL | Status: AC
  Filled 2015-08-15: qty 1

## 2015-08-15 NOTE — ED Notes (Signed)
Pt reports chest pain that started x1 hour ago. Pt denies any changes with movement or deep breath. nad noted.

## 2015-08-15 NOTE — H&P (Signed)
[facility] History and Physical  [name] ZOX:[id number] DOB: 1946-07-05 DOA: 08/15/2015  Referring physician: ER PCP: [name], MD   Chief Complaint: Chest pain  HPI: [name] is a 69 y.o. male  This is a 69 year old man who has a history of coronary artery disease who now presents with chest pain once again approximately 2 hours ago. He presented himself to the emergency room and in view of his history he was started on nitroglycerin drip. This has completely taken away his chest pain. He was admitted to [facility] just a few days ago with cardiac sounding chest pain. He also had elevation of troponin. Eventually he was treated medically. He is known to have ischemic systolic cart and myopathy with ejection fraction of only 40%. He is also known to peripheral neurovascular disease. He continued to smoke cigarettes and has a history of alcohol abuse, hypertension and diabetes. He also has a history of hyperlipidemia. He also has a history of noncompliance. In the emergency room he is on nitroglycerin drip as mentioned above as well as chronic anticoagulation in view of his atrial fibrillation. The case has been discussed with cardiology on-call this evening and they have elected to keep the patient here at this hospital and he can be transferred to [facility] if he gets worse. He appears to be stable at the present time.   Review of Systems:  Apart from symptoms above, all systems negative.  Past Medical History  Diagnosis Date  . Coronary atherosclerosis of native coronary artery     Ant. MI in 4/95; PTCA of 90% prox & distal LAD unsuccessful-->  Urgent CABG-4/95 TO Cx; 50% RCA; ant. HK; EF of 50-55%; 08/2008: 3-V disease plus a  95% LIMA stenosis, treated with DES; occlusion of SVG to CX; RCA SVG stenosis--> PCI with DES  2010: in-stent restenosis in the LIMA-->cutting balloon; EF of 35-40%;  07/2010: TO of SVG to     RCA-medical therapy advised;  EF of 30-35% by echo in 12/2010  . Atrial fibrillation 1995    1995, 2009; bradycardia with beta blocker therapy  . Tobacco abuse     40-50 pack years; currently one pack per day  . Peripheral vascular disease 1995    Abdominal aortic obstruction 1995-->vascular surgery  . Hyperlipidemia   . Type 2 diabetes mellitus   . DDD (degenerative disc disease)     Discectomy and fusion at L4 and L5  . Alcohol use 1999    Excessive alcohol use- quit in 1999  . Chronic systolic heart failure     LVEF 25-30% 2011  . Non-compliance    Past Surgical History  Procedure Laterality Date  . Cholecystectomy  1995  . Lumbar fusion      +Discectomy;x2; L4 and L5  . Aorto-femoral bypass graft  1995  . Left heart catheterization with coronary angiogram N/A 12/14/2013    Procedure: LEFT HEART CATHETERIZATION WITH CORONARY ANGIOGRAM;  Surgeon: Micheline Chapman, MD;  Location: [facility] [place] CATH LAB;  Service: Cardiovascular;  Laterality: N/A;  . Cardiac catheterization    . Back surgery     Social History:  reports that he has been smoking Cigarettes.  He has a 40 pack-year smoking history. He uses smokeless tobacco. He reports that he does not drink alcohol or use illicit drugs.  No Known Allergies  Family History  Problem Relation Age of Onset  . CAD Father     Prior to Admission medications  Medication Sig Start Date End Date Taking? Authorizing Provider  albuterol (PROVENTIL HFA;VENTOLIN HFA) 108 (90 BASE) MCG/ACT inhaler Inhale 2 puffs into the lungs every 6 (six) hours as needed for wheezing or shortness of breath.    Historical Provider, MD  carvedilol (COREG) 3.125 MG tablet Take 1 tablet (3.125 mg total) by mouth 2 (two) times daily with a meal. 08/10/15   Brittainy Sherlynn Carbon, PA-C  clopidogrel (PLAVIX) 75 MG tablet Take 1 tablet (75 mg total) by mouth daily. 08/10/15   Brittainy Sherlynn Carbon, PA-C  dabigatran (PRADAXA) 150 MG CAPS capsule Take 150 mg by mouth 2 (two) times daily.    Historical Provider,  MD  digoxin (LANOXIN) 0.125 MG tablet Take 125 mcg by mouth daily.      Historical Provider, MD  furosemide (LASIX) 40 MG tablet Take 1 tablet (40 mg total) by mouth every other day. 11/21/14   Antoine Poche, MD  insulin detemir (LEVEMIR) 100 UNIT/ML injection Inject 30 Units into the skin every morning.     Historical Provider, MD  isosorbide mononitrate (IMDUR) 30 MG 24 hr tablet Take 1 tablet (30 mg total) by mouth daily. 09/08/13   Butch Penny, MD  lisinopril (PRINIVIL,ZESTRIL) 5 MG tablet Take 1 tablet (5 mg total) by mouth daily. 02/28/14   Antoine Poche, MD  nitroGLYCERIN (NITROSTAT) 0.4 MG SL tablet Place 1 tablet (0.4 mg total) under the tongue every 5 (five) minutes as needed. 08/10/15   Brittainy Sherlynn Carbon, PA-C  potassium chloride SA (K-DUR,KLOR-CON) 20 MEQ tablet TAKE ONE TABLET TWICE DAILY 08/08/15   Antoine Poche, MD  ranolazine (RANEXA) 1000 MG SR tablet Take 1 tablet (1,000 mg total) by mouth 2 (two) times daily. 08/10/15   Brittainy Sherlynn Carbon, PA-C  rosuvastatin (CRESTOR) 40 MG tablet Take 40 mg by mouth daily.    Historical Provider, MD   Physical Exam: Filed Vitals:   08/15/15 1735 08/15/15 1941  BP: 88/56 114/61  Pulse: 72 70  Temp: 97.9 F (36.6 C)   TempSrc: Oral   Resp: 18 17  Height: 5\' 9"  (1.753 m)   Weight: 77.111 kg (170 lb)   SpO2: 100% 98%    Wt Readings from Last 3 Encounters:  08/15/15 77.111 kg (170 lb)  08/10/15 76.567 kg (168 lb 12.8 oz)  06/17/15 77.111 kg (170 lb)    General:  Appears calm and comfortable. He does not appear to have chest pain at the present time. Eyes: PERRL, normal lids, irises & conjunctiva ENT: grossly normal hearing, lips & tongue Neck: no LAD, masses or thyromegaly Cardiovascular: Irregular rhythm, consistent with atrial fibrillation. Telemetry: Atrial fibrillation. Respiratory: CTA bilaterally, no w/r/r. Normal respiratory effort. Abdomen: soft, ntnd Skin: no rash or induration seen on limited  exam Musculoskeletal: grossly normal tone BUE/BLE Psychiatric: grossly normal mood and affect, speech fluent and appropriate Neurologic: grossly non-focal.          Labs on Admission:  Basic Metabolic Panel:  Recent Labs Lab 08/09/15 0606 08/09/15 0945 08/10/15 0431 08/15/15 1751  NA 138 136 134* 134*  K 5.4* 4.4 4.2 4.7  CL 111 106 103 102  CO2 21* 23 22 23   GLUCOSE 168* 202* 139* 239*  BUN 19 15 20  24*  CREATININE 1.33* 1.29* 1.41* 2.03*  CALCIUM 8.4* 8.6* 8.4* 8.7*   Liver Function Tests:  Recent Labs Lab 08/09/15 0606  AST 30  ALT 20  ALKPHOS 55  BILITOT 0.9  PROT 6.4*  ALBUMIN 3.6  No results for input(s): LIPASE, AMYLASE in the last 168 hours. No results for input(s): AMMONIA in the last 168 hours. CBC:  Recent Labs Lab 08/09/15 0606 08/15/15 1751  WBC 8.7 9.6  HGB 14.0 14.6  HCT 42.6 43.3  MCV 96.6 94.5  PLT 156 196   Cardiac Enzymes:  Recent Labs Lab 08/09/15 0730 08/09/15 1342 08/09/15 2000 08/15/15 1751  TROPONINI 0.09* 0.07* 0.07* <0.03    BNP (last 3 results)  Recent Labs  06/10/15 0616 08/08/15 1836  BNP 174.0* 238.0*    ProBNP (last 3 results)  Recent Labs  08/22/14 1937 09/02/14 0030  PROBNP 1437.0* 1831.0*    CBG:  Recent Labs Lab 08/09/15 1133 08/09/15 1630 08/09/15 2140 08/10/15 0744 08/10/15 1156  GLUCAP 321* 168* 242* 137* 188*    Radiological Exams on Admission: Dg Chest Port 1 View  08/15/2015   CLINICAL DATA:  Chest pain and started 1 hour ago.  EXAM: PORTABLE CHEST - 1 VIEW  COMPARISON:  08/08/2015  FINDINGS: There is no focal parenchymal opacity. There is no pleural effusion or pneumothorax. The heart and mediastinal contours are unremarkable. There is evidence of prior CABG.  The osseous structures are unremarkable.  IMPRESSION: No active disease.   Electronically Signed   By: Elige Ko   On: 08/15/2015 18:17    EKG: Independently reviewed. Atrial fibrillation without any acute ST-T wave  changes.  Assessment/Plan   1. Chest pain. The pain may well be cardiac and has certainly improved with nitroglycerin drip. He is already on chronic anticoagulations therapy in view of his atrial fibrillation. Cycle cardiac enzymes. Cardiology consultation in the morning unless he gets worse tonight. 2. Atrial fibrillation. Rate is controlled. 3. Diabetes. Continue with home medications and sliding scale of insulin. 4. Worsening renal failure. Gentle IV fluids. Monitor renal function closely.  Further recommendations will depend on patient's hospital progress.  Code Status: Full code.   DVT Prophylaxis: Pradaxa  Family Communication: I discussed the plan with the patient at the bedside.   Disposition Plan: Home when medically stable.  Time spent: 45 minutes.  Wilson Singer [facility] Pager [phone].

## 2015-08-16 ENCOUNTER — Encounter (HOSPITAL_COMMUNITY): Payer: Self-pay | Admitting: Cardiology

## 2015-08-16 ENCOUNTER — Inpatient Hospital Stay (HOSPITAL_COMMUNITY): Payer: Medicare Other

## 2015-08-16 DIAGNOSIS — I200 Unstable angina: Secondary | ICD-10-CM

## 2015-08-16 DIAGNOSIS — I482 Chronic atrial fibrillation: Secondary | ICD-10-CM

## 2015-08-16 DIAGNOSIS — I25119 Atherosclerotic heart disease of native coronary artery with unspecified angina pectoris: Secondary | ICD-10-CM

## 2015-08-16 DIAGNOSIS — N183 Chronic kidney disease, stage 3 (moderate): Secondary | ICD-10-CM

## 2015-08-16 DIAGNOSIS — Z720 Tobacco use: Secondary | ICD-10-CM

## 2015-08-16 LAB — COMPREHENSIVE METABOLIC PANEL WITH GFR
ALT: 18 U/L (ref 17–63)
AST: 20 U/L (ref 15–41)
Albumin: 3.9 g/dL (ref 3.5–5.0)
Alkaline Phosphatase: 49 U/L (ref 38–126)
Anion gap: 8 (ref 5–15)
BUN: 23 mg/dL — ABNORMAL HIGH (ref 6–20)
CO2: 22 mmol/L (ref 22–32)
Calcium: 8.5 mg/dL — ABNORMAL LOW (ref 8.9–10.3)
Chloride: 103 mmol/L (ref 101–111)
Creatinine, Ser: 1.54 mg/dL — ABNORMAL HIGH (ref 0.61–1.24)
GFR calc Af Amer: 52 mL/min — ABNORMAL LOW
GFR calc non Af Amer: 45 mL/min — ABNORMAL LOW
Glucose, Bld: 292 mg/dL — ABNORMAL HIGH (ref 65–99)
Potassium: 4.5 mmol/L (ref 3.5–5.1)
Sodium: 133 mmol/L — ABNORMAL LOW (ref 135–145)
Total Bilirubin: 0.3 mg/dL (ref 0.3–1.2)
Total Protein: 6.9 g/dL (ref 6.5–8.1)

## 2015-08-16 LAB — GLUCOSE, CAPILLARY
GLUCOSE-CAPILLARY: 221 mg/dL — AB (ref 65–99)
GLUCOSE-CAPILLARY: 192 mg/dL — AB (ref 65–99)
GLUCOSE-CAPILLARY: 233 mg/dL — AB (ref 65–99)
Glucose-Capillary: 227 mg/dL — ABNORMAL HIGH (ref 65–99)
Glucose-Capillary: 243 mg/dL — ABNORMAL HIGH (ref 65–99)

## 2015-08-16 LAB — CBC
HEMATOCRIT: 42 % (ref 39.0–52.0)
HEMOGLOBIN: 14.2 g/dL (ref 13.0–17.0)
MCH: 31.8 pg (ref 26.0–34.0)
MCHC: 33.8 g/dL (ref 30.0–36.0)
MCV: 94.2 fL (ref 78.0–100.0)
Platelets: 157 10*3/uL (ref 150–400)
RBC: 4.46 MIL/uL (ref 4.22–5.81)
RDW: 13 % (ref 11.5–15.5)
WBC: 9.4 10*3/uL (ref 4.0–10.5)

## 2015-08-16 LAB — TROPONIN I: Troponin I: <0.03 ng/mL

## 2015-08-16 MED ORDER — LISINOPRIL 5 MG PO TABS
2.5000 mg | ORAL_TABLET | Freq: Every day | ORAL | Status: DC
  Administered 2015-08-16 – 2015-08-17 (×2): 2.5 mg via ORAL
  Filled 2015-08-16 (×2): qty 1

## 2015-08-16 MED ORDER — DIGOXIN 125 MCG PO TABS
125.0000 ug | ORAL_TABLET | ORAL | Status: DC
  Administered 2015-08-17: 125 ug via ORAL
  Filled 2015-08-16: qty 1

## 2015-08-16 MED ORDER — ISOSORBIDE MONONITRATE ER 60 MG PO TB24
30.0000 mg | ORAL_TABLET | Freq: Every day | ORAL | Status: DC
  Administered 2015-08-16: 30 mg via ORAL
  Filled 2015-08-16: qty 1

## 2015-08-16 NOTE — Progress Notes (Signed)
Appreciate cardiology note no further chest discomfort troponins are negative on IV nitroglycerin question of compliance with insulin will check a hemoglobin A1c today transferred to telemetry observe symptoms CAM DAUPHIN ZOX:[id number] DOB: October 03, 1946 DOA: 08/15/2015 PCP: [name], MD             Physical Exam: Blood pressure 107/39, pulse 31, temperature 98.2 F (36.8 C), temperature source Oral, resp. rate 11, height  (1.753 m), weight 167 lb 8.8 oz (76 kg), SpO2 99 %. lungs clear to A&P prolonged respiratory phase scattered rhonchi no rales heart regular rhythm heart irregular regular no S3 no heave thrills rubs abdomen soft nontender bowel sounds normoactive  Investigations:  Recent Results (from the past 240 hour(s))  MRSA PCR Screening     Status: None   Collection Time: 08/15/15 10:16 PM  Result Value Ref Range Status   MRSA by PCR NEGATIVE NEGATIVE Final    Comment:        The GeneXpert MRSA Assay (FDA approved for NASAL specimens only), is one component of a comprehensive MRSA colonization surveillance program. It is not intended to diagnose MRSA infection nor to guide or monitor treatment for MRSA infections.      Basic Metabolic Panel:  Recent Labs  81/19/14 1751 08/16/15 0316  NA 134* 133*  K 4.7 4.5  CL 102 103  CO2 23 22  GLUCOSE 239* 292*  BUN 24* 23*  CREATININE 2.03* 1.54*  CALCIUM 8.7* 8.5*   Liver Function Tests:  Recent Labs  08/16/15 0316  AST 20  ALT 18  ALKPHOS 49  BILITOT 0.3  PROT 6.9  ALBUMIN 3.9     CBC:  Recent Labs  08/15/15 1751 08/16/15 0316  WBC 9.6 9.4  HGB 14.6 14.2  HCT 43.3 42.0  MCV 94.5 94.2  PLT 196 157    Dg Chest Port 1 View  08/15/2015   CLINICAL DATA:  Chest pain and started 1 hour ago.  EXAM: PORTABLE CHEST - 1 VIEW  COMPARISON:  08/08/2015  FINDINGS: There is no focal parenchymal opacity. There is no pleural effusion or pneumothorax. The heart and mediastinal contours are  unremarkable. There is evidence of prior CABG.  The osseous structures are unremarkable.  IMPRESSION: No active disease.   Electronically Signed   By: Elige Ko   On: 08/15/2015 18:17      Medications:   Impression:  Active Problems:   Diabetes   Atrial fibrillation   Chest pain   Chest pain, rule out acute myocardial infarction     Plan: Titrate by mouth medicines and observe symptoms consider early discharge as per cardiology. We'll get hemoglobin A1c  Consultants: Cardiology   Procedures   Antibiotics:                   Code Status:   Family Communication:    Disposition Plan see plan above Time spent: 30 minutes   LOS: 1 day   Davien Malone M   08/16/2015, 12:02 PM

## 2015-08-16 NOTE — Care Management Note (Signed)
Case Management Note  Patient Details  Name: CURRY SEEFELDT MRN: [id number] Date of Birth: 1946-05-28  Expected Discharge Date:                  Expected Discharge Plan:  Home/Self Care  In-House Referral:  NA  Discharge planning Services  CM Consult  Post Acute Care Choice:  NA Choice offered to:  NA  DME Arranged:    DME Agency:     HH Arranged:    HH Agency:     Status of Service:  Completed, signed off  Medicare Important Message Given:    Date Medicare IM Given:    Medicare IM give by:    Date Additional Medicare IM Given:    Additional Medicare Important Message give by:     If discussed at Long Length of Stay Meetings, dates discussed:    Additional Comments: Pt admitted with CP. Pt from home, lives with wife and is independent with ADL's. Pt plans to discharge home with self care. No CM needs.  Malcolm Metro, RN 08/16/2015, 11:53 AM

## 2015-08-16 NOTE — Progress Notes (Signed)
Report called to B.Bowers,RN. Patient transferred to 320 in stable condition via wheelchair with RN.

## 2015-08-16 NOTE — Consult Note (Signed)
CARDIOLOGY CONSULT NOTE   Patient ID: [name] MRN: [id number] DOB/AGE: [birth year] 69 y.o.  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  He was was recently admitted to [facility] after evaluation for chest pain and minor troponin I elevations. He was treated medically after review of cath from 2014. His Ranexa was increased, ASA was discontinued and he was placed on Plavix and Pradaxa was restarted. On discharge he had no recurrence of chest pain with up titration of Ranexa to 1000 mg BID.  He states he was sitting on his front porch yesterday evening when he felt pressure in the middle of his chest. It lasted about an hour, he became concerned and came to ER. He is vague and seemed defensive about taking his medications. He admits that he "smokes all the time." He denies associated dyspnea, diaphoresis, syncope, NV associated with the pressure.   He returned to the ER at [facility] with complaints of recurrent chest pain improved with NTG gtt. On arrival he was found to be hypotensive with BP of 88/56, HR 72, O2 sat 100%. Troponin neg X 1 with subsequent troponin negative X 3. Glucose of 239, Na 124, Creatinine of 2.03. PT 21.0, INR 1.81. EKG demonstrating atrial fib with evidence of anterior/lateral and inferior infarct. He was admitted to rule out cardiac etiology of chest pain. He was treated with IV fluids and creatinine is improved this am.     No Known Allergies  Medications Scheduled Medications: . carvedilol  3.125 mg Oral BID WC  . clopidogrel  75 mg Oral Daily  . dabigatran  150 mg Oral BID  . [START ON 08/17/2015] digoxin  125 mcg Oral QODAY  . furosemide  40 mg Oral QODAY  . insulin aspart  0-5 Units Subcutaneous QHS  . insulin aspart  0-9 Units Subcutaneous TID WC  . insulin detemir  30 Units Subcutaneous q morning - 10a  . lisinopril  2.5 mg Oral Daily  . ondansetron (ZOFRAN) IV  4 mg Intravenous Once  . potassium chloride SA  20 mEq Oral BID  . ranolazine  1,000 mg Oral BID  . rosuvastatin  40 mg Oral q1800  . sodium chloride  3 mL Intravenous Q12H    Infusions: . sodium chloride 75 mL/hr at 08/15/15 2240  . nitroGLYCERIN 5 mcg/min (08/15/15 2245)    PRN Medications: albuterol, morphine injection, nitroGLYCERIN, ondansetron **OR** ondansetron (ZOFRAN) IV   Past Medical History  Diagnosis Date  . Coronary atherosclerosis of native coronary artery     Ant. MI in 4/95; PTCA of 90% prox & distal LAD unsuccessful-->  Urgent CABG-4/95 TO Cx; 50% RCA; ant. HK; EF of 50-55%; 08/2008: 3-V disease plus a  95% LIMA stenosis, treated with DES; occlusion of SVG to CX; RCA SVG stenosis--> PCI with DES  2010: in-stent restenosis in the LIMA-->cutting balloon; EF of 35-40%;  07/2010: TO of SVG to RCA-medical therapy advised  . Atrial fibrillation     1995, 2009; bradycardia with beta blocker therapy  . Peripheral vascular disease 1995  Abdominal aortic obstruction 1995-->vascular surgery  . Hyperlipidemia   . Type 2 diabetes mellitus   . DDD (degenerative disc disease)     Discectomy and fusion at L4 and L5  . History of alcohol abuse     Quit in 1999  . Chronic systolic heart failure   . Non-compliance   . Ischemic cardiomyopathy     LVEF 40-45% August 2016    Past Surgical History  Procedure Laterality Date  . Cholecystectomy  1995  . Lumbar fusion      +Discectomy;x2; L4 and L5  . Aorto-femoral bypass  graft  1995  . Left heart catheterization with coronary angiogram N/A 12/14/2013    Procedure: LEFT HEART CATHETERIZATION WITH CORONARY ANGIOGRAM;  Surgeon: Micheline Chapman, MD;  Location: [facility] CATH LAB;  Service: Cardiovascular;  Laterality: N/A;  . Back surgery      Family History  Problem Relation Age of Onset  . CAD Father     Social History [name] reports that he has been smoking Cigarettes.  He has a 20 pack-year smoking history. He uses smokeless tobacco. [name] reports that he does not drink alcohol.  Review of Systems Complete review of systems are found to be negative unless outlined in H&P above.  Physical Examination Blood pressure 107/39, pulse 31, temperature 98.2 F (36.8 C), temperature source Oral, resp. rate 11, height 5\' 9"  (1.753 m), weight 167 lb 8.8 oz (76 kg), SpO2 99 %.  Intake/Output Summary (Last 24 hours) at 08/16/15 0957 Last data filed at 08/16/15 0045  Gross per 24 hour  Intake  11.08 ml  Output    425 ml  Net -413.92 ml    Telemetry: Atrial fib with slow ventricular response in the 50's.   GEN: Somewhat disheveled appearing in no distress. HEENT: Conjunctiva and lids normal, oropharynx clear. Neck: Supple, no elevated JVP or carotid bruits, no thyromegaly. Lungs: Decreased breath sounds without rales, nonlabored breathing at rest. Cardiac: Irregular rate and rhythm, bradycardic, no S3 or significant systolic murmur, no pericardial rub. Abdomen: Soft, nontender, bowel sounds present, no guarding or rebound. Extremities: No pitting edema, distal pulses 2+. Skin: Warm and dry. Musculoskeletal: No kyphosis Neuropsychiatric: Alert and oriented x3, affect grossly appropriate. Easily anxious with questions about his symptoms.   Prior Cardiac Testing/Procedures  1. 2D echo 08/09/15 - Left ventricle: The cavity size was normal. Wall thickness was normal. Systolic function was mildly to moderately reduced. The estimated ejection fraction  was in the range of 40% to 45%. There is akinesis of the apicalanteroseptal and apical myocardium. The study is not technically sufficient to allow evaluation of LV diastolic function. - Mitral valve: Calcified annulus. - Left atrium: The atrium was mildly dilated. Impressions:   EF is improved when compared to prior echocardiogram.  Cardiac Cath 12/14/2013 Assessment: 1. Severe 3-VAD CAD 2. SVG x 2 (RCA and OM) chronically occluded 3. Patent LIMA to LAD 4. Severe LV dysfunction EF 20-25% with elevated LVEDP  Plan/Discussion: Coronary anatomy is stable since prior cath in 2011. No clear culprit for NSTEMI or target for revascularization. Elevated filling pressures. Can go home later today or tomorrow if pain under control. _________________________________________________________________________________________________________  Lab Results  Basic Metabolic Panel:  Recent Labs Lab 08/10/15 0431 08/15/15 1751 08/16/15 0316  NA 134* 134* 133*  K 4.2 4.7 4.5  CL 103 102 103  CO2 22 23 22   GLUCOSE 139* 239* 292*  BUN 20 24* 23*  CREATININE 1.41* 2.03* 1.54*  CALCIUM 8.4*  8.7* 8.5*    Liver Function Tests:  Recent Labs Lab 08/16/15 0316  AST 20  ALT 18  ALKPHOS 49  BILITOT 0.3  PROT 6.9  ALBUMIN 3.9    CBC:  Recent Labs Lab 08/15/15 1751 08/16/15 0316  WBC 9.6 9.4  HGB 14.6 14.2  HCT 43.3 42.0  MCV 94.5 94.2  PLT 196 157    Cardiac Enzymes:  Recent Labs Lab 08/09/15 2000 08/15/15 1751 08/15/15 2106 08/16/15 0316 08/16/15 0859  TROPONINI 0.07* <0.03 <0.03 <0.03 <0.03    Radiology: Dg Chest Port 1 View  08/15/2015   CLINICAL DATA:  Chest pain and started 1 hour ago.  EXAM: PORTABLE CHEST - 1 VIEW  COMPARISON:  08/08/2015  FINDINGS: There is no focal parenchymal opacity. There is no pleural effusion or pneumothorax. The heart and mediastinal contours are unremarkable. There is evidence of prior CABG.  The osseous structures are unremarkable.   IMPRESSION: No active disease.   Electronically Signed   By: Elige Ko   On: 08/15/2015 18:17    ECG: Atrial fib with evidence of infero/antero/lateral infarction. Left axis deviation. Rate of 74 bpm.   Impression and Recommendations  1. Recurrent angina with known CAD: Troponin's are negative X 3. EKG is overall nonspecific without acute ST segment changes, baseline atrial fibrillation with evidence of prior anterior and possibly inferior infarct. He is pain free since admission. He is vague on medication compliance but also states that he took his medications when he started having chest pain last evening. He was hypotensive on admission and remains somewhat low now. I will decrease lisinopril to 2.5 mg daily. Creatinine was elevated on admission and is improved with IV hydration. Can restart at lower dose once he is improved. Continue coreg at 3.125 mg BID. Will discuss with Dr. Diona Browner concerning medication adjustments. Can move out of unit and be more active to see how well he does with activity and recurrence of pain.  Consider adding nitrates. Wait for BP to come back up.   2. Multivessel CAD: Known occluded SVG to RCA and SVG to OM w/ISR of LIMA to LAD managed with cutting balloon in 2010, chronic systolic ischemic cardiomyopathy LVEF 40-45% (2015).   3. Atrial Fib: He is on Pradaxa per discharge summary.  He is bradycardic currently on digoxin with plans to decrease dig to QOD.   4. Hypotension: Improved with IV fluid hydration. Will decrease his lisinopril to 2.5 mg daily.   Bettey Mare. Lawrence NP [facility]  08/16/2015, 9:57 AM Co-Sign MD   Attending note:  Patient seen and examined. Reviewed records and discussed the case with Ms. Lawrence NP, modifications also made to the above note. [name] presents with recurrent angina symptoms, recently evaluated at [facility]. He has multivessel CAD status post previous PCI and CABG. Cardiac catheterization in December 2014 showed  occluded SVG to RCA, occluded SVG to OM, patent LIMA to LAD with 30% stenosis at the proximal edge of the previously placed stent site, and diffuse native CAD with left to right collaterals. He has been managed medically since that time. Treatment is limited by history of medication noncompliance, also ongoing tobacco abuse.  I reviewed his recent hospital stay and discharge summary regarding medication adjustments. He states that he has been taking his medicines as directed since discharge. He did not try nitroglycerin when his symptoms occurred at home. On examination now he is comfortable, no active chest pain, systolic blood pressure 110, atrial fibrillation by monitor  with heart rate in the 50s. Somewhat disheveled, in no distress. Lungs exhibit decreased breath sounds without rales, cardiac exam with irregularly irregular rhythm and no gallop, no leg edema. Lab work shows creatinine 1.5, normal troponin I levels. ECG shows no acute ST segment changes. Chest x-ray reports evidence of previous CABG, no acute findings.  Plan at this time is to reduce lisinopril to 2.5 mg daily, change digoxin to 0.125 mg every other day, and place him back on Imdur 30 mg daily which he had taken in the past. Keep an eye on heart rate and blood pressure in case additional adjustments need to be made. Plan to optimize medications as best as possible. Smoking cessation also discussed, although he has not been able to quit. If this strategy is not effective, he could eventually be considered for a relook heart catheterization to see if there are any revascularization options available - LIMA to LAD would be the most approachable area if there has been any substantial restenosis. Otherwise his options look to be limited based on prior report.  Jonelle Sidle, M.D., F.A.C.C.

## 2015-08-17 DIAGNOSIS — I255 Ischemic cardiomyopathy: Secondary | ICD-10-CM

## 2015-08-17 LAB — BASIC METABOLIC PANEL
Anion gap: 7 (ref 5–15)
BUN: 19 mg/dL (ref 6–20)
CHLORIDE: 103 mmol/L (ref 101–111)
CO2: 26 mmol/L (ref 22–32)
CREATININE: 1.32 mg/dL — AB (ref 0.61–1.24)
Calcium: 8.9 mg/dL (ref 8.9–10.3)
GFR calc Af Amer: >60 mL/min (ref >60)
GFR, EST NON AFRICAN AMERICAN: 54 mL/min — AB (ref >60)
GLUCOSE: 181 mg/dL — AB (ref 65–99)
Potassium: 4.5 mmol/L (ref 3.5–5.1)
SODIUM: 136 mmol/L (ref 135–145)

## 2015-08-17 LAB — HEMOGLOBIN A1C
HEMOGLOBIN A1C: 7.7 % — AB (ref 4.8–5.6)
MEAN PLASMA GLUCOSE: 174 mg/dL

## 2015-08-17 LAB — GLUCOSE, CAPILLARY
Glucose-Capillary: 175 mg/dL — ABNORMAL HIGH (ref 65–99)
Glucose-Capillary: 245 mg/dL — ABNORMAL HIGH (ref 65–99)

## 2015-08-17 NOTE — Progress Notes (Signed)
Primary cardiologist: Dr. Dina Rich  Seen for followup: Angina, CAD  Subjective:    Feels better today, no chest pain or breathlessness stirring around in the room.  Objective:   Temp:  [97.8 F (36.6 C)-98.4 F (36.9 C)] 98.4 F (36.9 C) (08/26 0519) Pulse Rate:  [34-86] 64 (08/26 0853) Resp:  [13-24] 18 (08/26 0519) BP: (100-136)/(31-92) 132/61 mmHg (08/26 0519) SpO2:  [99 %-100 %] 100 % (08/26 0519) Last BM Date: 08/15/15  Filed Weights   08/15/15 1735 08/15/15 2235 08/16/15 0510  Weight: 170 lb (77.111 kg) 167 lb 15.9 oz (76.2 kg) 167 lb 8.8 oz (76 kg)    Intake/Output Summary (Last 24 hours) at 08/17/15 0913 Last data filed at 08/17/15 0700  Gross per 24 hour  Intake   1965 ml  Output   2775 ml  Net   -810 ml    Telemetry: Atrial fibrillation.  Exam:  General: Appears comfortable.  Lungs: Decreased breath sounds, nonlabored.  Cardiac: Irregularly irregular, no gallop.  Extremities: No pitting.  Lab Results:  Basic Metabolic Panel:  Recent Labs Lab 08/15/15 1751 08/16/15 0316 08/17/15 0609  NA 134* 133* 136  K 4.7 4.5 4.5  CL 102 103 103  CO2 GLUCOSE 239* 292* 181*  BUN 24* 23* 19  CREATININE 2.03* 1.54* 1.32*  CALCIUM 8.7* 8.5* 8.9    Liver Function Tests:  Recent Labs Lab 08/16/15 0316  AST 20  ALT 18  ALKPHOS 49  BILITOT 0.3  PROT 6.9  ALBUMIN 3.9    CBC:  Recent Labs Lab 08/15/15 1751 08/16/15 0316  WBC 9.6 9.4  HGB 14.6 14.2  HCT 43.3 42.0  MCV 94.5 94.2  PLT 196 157    Cardiac Enzymes:  Recent Labs Lab 08/15/15 2106 08/16/15 0316 08/16/15 0859  TROPONINI <0.03 <0.03 <0.03    BNP:  Recent Labs  08/22/14 1937 09/02/14 0030  PROBNP 1437.0* 1831.0*    ECG: Atrial fibrillation with old anterior infarct pattern, left anterior fascicular block versus old inferior infarct pattern, nonspecific ST changes.   Medications:   Scheduled Medications: . carvedilol  3.125 mg Oral BID WC  .  clopidogrel  75 mg Oral Daily  . dabigatran  150 mg Oral BID  . digoxin  125 mcg Oral QODAY  . furosemide  40 mg Oral QODAY  . insulin aspart  0-5 Units Subcutaneous QHS  . insulin aspart  0-9 Units Subcutaneous TID WC  . insulin detemir  30 Units Subcutaneous q morning - 10a  . isosorbide mononitrate  30 mg Oral QHS  . lisinopril  2.5 mg Oral Daily  . ondansetron (ZOFRAN) IV  4 mg Intravenous Once  . potassium chloride SA  20 mEq Oral BID  . ranolazine  1,000 mg Oral BID  . rosuvastatin  40 mg Oral q1800  . sodium chloride  3 mL Intravenous Q12H    Infusions: . sodium chloride 75 mL/hr at 08/16/15 2201    PRN Medications: albuterol, morphine injection, nitroGLYCERIN, ondansetron **OR** ondansetron (ZOFRAN) IV   Assessment:   1. Unstable angina, symptoms improved with medication adjustments. Troponin I levels are normal. ECG without acute ST segment changes.  2. Multivessel CAD status post PCI and CABG with known graft disease and limited revascularization options. He is being managed medically.  3. Ischemic cardiomyopathy, LVEF 40-45% by recent echocardiogram.  4. Chronic atrial fibrillation, rate controlled and on Pradaxa.   Plan/Discussion:    No further cardiac workup is  planned at this time. Patient stable for discharge from a cardiac perspective. Needs to continue medications as currently written and keep follow-up with Dr. [name] on September 2.   Jonelle Sidle, M.D., F.A.C.C.

## 2015-08-17 NOTE — Progress Notes (Signed)
Inpatient Diabetes Program Recommendations  AACE/ADA: New Consensus Statement on Inpatient Glycemic Control (2013)  Target Ranges:  Prepandial:   less than 140 mg/dL      Peak postprandial:   less than 180 mg/dL (1-2 hours)      Critically ill patients:  140 - 180 mg/dL   Results for [name], [name] (MRN [id number]) as of 08/17/2015 08:39  Ref. Range 08/16/2015 07:26 08/16/2015 11:27 08/16/2015 16:27 08/16/2015 20:27 08/17/2015 07:52  Glucose-Capillary Latest Ref Range: 65-99 mg/dL 409 (H) 811 (H) 914 (H) 243 (H) 175 (H)    Diabetes history: DM2 Outpatient Diabetes medications: Levemir 30 units QAM Current orders for Inpatient glycemic control: Levemir 30 units QAM, Novolog 0-9 units TID with meals, Novolog 0-5 units HS  Inpatient Diabetes Program Recommendations Insulin - Basal: Please consider increasing Levemir to 32 units QAM. Correction (SSI): Please consider increasing Novolog correction to moderate scale. Insulin - Meal Coverage: Please consider ordering Novolog 5 units TID with meals for meal coverage.  Thanks, [name], RN, MSN, CCRN, CDE Diabetes Coordinator Inpatient Diabetes Program [phone] (Team Pager from 8am to 5pm) [phone] ([facility] office) [phone] [facility] office) [phone] [facility] office)

## 2015-08-17 NOTE — ED Provider Notes (Signed)
CSN: [id number]     Arrival date & time 08/15/15  1730 History   First MD Initiated Contact with Patient 08/15/15 1746     Chief Complaint  Patient presents with  . Chest Pain     HPI  Patient presents for evaluation of chest pain. Symptoms started at home one hour ago while at rest. Patient has history of prior MI, PTCA, and CABG. Most recent cath shows vascular anatomy not amenable to any additional interventions. Has been recommended to undergo maximum medical therapy. Medications include Plavix, Pradaxa, Lanoxin, Imdur, and Ranexa. Cranial most recent admission had a minimal elevation of his troponin. His Ranexa was increased.  Past Medical History  Diagnosis Date  . Coronary atherosclerosis of native coronary artery     Ant. MI in 4/95; PTCA of 90% prox & distal LAD unsuccessful-->  Urgent CABG-4/95 TO Cx; 50% RCA; ant. HK; EF of 50-55%; 08/2008: 3-V disease plus a  95% LIMA stenosis, treated with DES; occlusion of SVG to CX; RCA SVG stenosis--> PCI with DES  2010: in-stent restenosis in the LIMA-->cutting balloon; EF of 35-40%;  07/2010: TO of SVG to RCA-medical therapy advised  . Atrial fibrillation     1995, 2009; bradycardia with beta blocker therapy  . Peripheral vascular disease 1995    Abdominal aortic obstruction 1995-->vascular surgery  . Hyperlipidemia   . Type 2 diabetes mellitus   . DDD (degenerative disc disease)     Discectomy and fusion at L4 and L5  . History of alcohol abuse     Quit in 1999  . Chronic systolic heart failure   . Non-compliance   . Ischemic cardiomyopathy     LVEF 40-45% August 2016   Past Surgical History  Procedure Laterality Date  . Cholecystectomy  1995  . Lumbar fusion      +Discectomy;x2; L4 and L5  . Aorto-femoral bypass graft  1995  . Left heart catheterization with coronary angiogram N/A 12/14/2013    Procedure: LEFT HEART CATHETERIZATION WITH CORONARY ANGIOGRAM;  Surgeon: Micheline Chapman, MD;  Location: [facility] CATH LAB;  Service:  Cardiovascular;  Laterality: N/A;  . Back surgery     Family History  Problem Relation Age of Onset  . CAD Father    Social History  Substance Use Topics  . Smoking status: Current Every Day Smoker -- 0.50 packs/day for 40 years    Types: Cigarettes  . Smokeless tobacco: Current User     Comment: refused  . Alcohol Use: No     Comment: former    Review of Systems  Constitutional: Negative for fever, chills, diaphoresis, appetite change and fatigue.  HENT: Negative for mouth sores, sore throat and trouble swallowing.   Eyes: Negative for visual disturbance.  Respiratory: Positive for chest tightness and shortness of breath. Negative for cough and wheezing.   Cardiovascular: Positive for chest pain.  Gastrointestinal: Negative for nausea, vomiting, abdominal pain, diarrhea and abdominal distention.  Endocrine: Negative for polydipsia, polyphagia and polyuria.  Genitourinary: Negative for dysuria, frequency and hematuria.  Musculoskeletal: Negative for gait problem.  Skin: Negative for color change, pallor and rash.  Neurological: Negative for dizziness, syncope, light-headedness and headaches.  Hematological: Does not bruise/bleed easily.  Psychiatric/Behavioral: Negative for behavioral problems and confusion.      Allergies  Review of patient's allergies indicates no known allergies.  Home Medications   Prior to Admission medications   Medication Sig Start Date End Date Taking? Authorizing Provider  albuterol (PROVENTIL HFA;VENTOLIN HFA) 108 (90  BASE) MCG/ACT inhaler Inhale 2 puffs into the lungs every 6 (six) hours as needed for wheezing or shortness of breath.   Yes Historical Provider, MD  carvedilol (COREG) 3.125 MG tablet Take 1 tablet (3.125 mg total) by mouth 2 (two) times daily with a meal. 08/10/15  Yes Brittainy Sherlynn Carbon, PA-C  clopidogrel (PLAVIX) 75 MG tablet Take 1 tablet (75 mg total) by mouth daily. 08/10/15  Yes Brittainy Sherlynn Carbon, PA-C  dabigatran  (PRADAXA) 150 MG CAPS capsule Take 150 mg by mouth 2 (two) times daily.   Yes Historical Provider, MD  digoxin (LANOXIN) 0.125 MG tablet Take 125 mcg by mouth daily.     Yes Historical Provider, MD  furosemide (LASIX) 40 MG tablet Take 1 tablet (40 mg total) by mouth every other day. 11/21/14  Yes Antoine Poche, MD  insulin detemir (LEVEMIR) 100 UNIT/ML injection Inject 30 Units into the skin every morning.    Yes Historical Provider, MD  isosorbide mononitrate (IMDUR) 30 MG 24 hr tablet Take 1 tablet (30 mg total) by mouth daily. 09/08/13  Yes Angus [name], MD  lisinopril (PRINIVIL,ZESTRIL) 5 MG tablet Take 1 tablet (5 mg total) by mouth daily. 02/28/14  Yes Antoine Poche, MD  nitroGLYCERIN (NITROSTAT) 0.4 MG SL tablet Place 1 tablet (0.4 mg total) under the tongue every 5 (five) minutes as needed. 08/10/15  Yes Brittainy Sherlynn Carbon, PA-C  potassium chloride SA (K-DUR,KLOR-CON) 20 MEQ tablet TAKE ONE TABLET TWICE DAILY 08/08/15  Yes Antoine Poche, MD  ranolazine (RANEXA) 1000 MG SR tablet Take 1 tablet (1,000 mg total) by mouth 2 (two) times daily. 08/10/15  Yes Brittainy Sherlynn Carbon, PA-C  rosuvastatin (CRESTOR) 40 MG tablet Take 40 mg by mouth daily.   Yes Historical Provider, MD   BP 132/61 mmHg  Pulse 54  Temp(Src) 98.4 F (36.9 C) (Oral)  Resp 18  Ht 5\' 9"  (1.753 m)  Wt 167 lb 8.8 oz (76 kg)  BMI 24.73 kg/m2  SpO2 100% Physical Exam  Constitutional: He is oriented to person, place, and time. He appears well-developed and well-nourished. No distress.  HENT:  Head: Normocephalic.  Eyes: Conjunctivae are normal. Pupils are equal, round, and reactive to light. No scleral icterus.  Neck: Normal range of motion. Neck supple. No thyromegaly present.  Cardiovascular: Normal rate and regular rhythm.  Exam reveals no gallop and no friction rub.   No murmur heard. Pulmonary/Chest: Effort normal and breath sounds normal. No respiratory distress. He has no wheezes. He has no rales.   Abdominal: Soft. Bowel sounds are normal. He exhibits no distension. There is no tenderness. There is no rebound.  Musculoskeletal: Normal range of motion.  Neurological: He is alert and oriented to person, place, and time.  Skin: Skin is warm and dry. No rash noted.  Psychiatric: He has a normal mood and affect. His behavior is normal.    ED Course  Procedures (including critical care time) Labs Review Labs Reviewed  BASIC METABOLIC PANEL - Abnormal; Notable for the following:    Sodium 134 (*)    Glucose, Bld 239 (*)    BUN 24 (*)    Creatinine, Ser 2.03 (*)    Calcium 8.7 (*)    GFR calc non Af Amer 32 (*)    GFR calc Af Amer 37 (*)    All other components within normal limits  PROTIME-INR - Abnormal; Notable for the following:    Prothrombin Time 21.0 (*)    INR 1.81 (*)  All other components within normal limits  COMPREHENSIVE METABOLIC PANEL - Abnormal; Notable for the following:    Sodium 133 (*)    Glucose, Bld 292 (*)    BUN 23 (*)    Creatinine, Ser 1.54 (*)    Calcium 8.5 (*)    GFR calc non Af Amer 45 (*)    GFR calc Af Amer 52 (*)    All other components within normal limits  GLUCOSE, CAPILLARY - Abnormal; Notable for the following:    Glucose-Capillary 192 (*)    All other components within normal limits  GLUCOSE, CAPILLARY - Abnormal; Notable for the following:    Glucose-Capillary 233 (*)    All other components within normal limits  BASIC METABOLIC PANEL - Abnormal; Notable for the following:    Glucose, Bld 181 (*)    Creatinine, Ser 1.32 (*)    GFR calc non Af Amer 54 (*)    All other components within normal limits  GLUCOSE, CAPILLARY - Abnormal; Notable for the following:    Glucose-Capillary 227 (*)    All other components within normal limits  GLUCOSE, CAPILLARY - Abnormal; Notable for the following:    Glucose-Capillary 221 (*)    All other components within normal limits  GLUCOSE, CAPILLARY - Abnormal; Notable for the following:     Glucose-Capillary 243 (*)    All other components within normal limits  MRSA PCR SCREENING  CBC  TROPONIN I  TROPONIN I  TROPONIN I  CBC  TROPONIN I  HEMOGLOBIN A1C  HEMOGLOBIN A1C    Imaging Review Dg Chest Port 1 View  08/15/2015   CLINICAL DATA:  Chest pain and started 1 hour ago.  EXAM: PORTABLE CHEST - 1 VIEW  COMPARISON:  08/08/2015  FINDINGS: There is no focal parenchymal opacity. There is no pleural effusion or pneumothorax. The heart and mediastinal contours are unremarkable. There is evidence of prior CABG.  The osseous structures are unremarkable.  IMPRESSION: No active disease.   Electronically Signed   By: Elige Ko   On: 08/15/2015 18:17   I have personally reviewed and evaluated these images and lab results as part of my medical decision-making.   EKG Interpretation   Date/Time:  Wednesday August 15 2015 17:35:13 EDT Ventricular Rate:  70 PR Interval:    QRS Duration: 70 QT Interval:  360 QTC Calculation: 388 R Axis:   -38 Text Interpretation:  Atrial fibrillation Left axis deviation Low voltage  QRS Inferior infarct , age undetermined Cannot rule out Anteroseptal  infarct , age undetermined Abnormal ECG Confirmed by Fayrene Fearing  MD, [place]  [phone]) on 08/15/2015 5:46:31 PM      MDM   Final diagnoses:  Angina at rest    Placed on  a nitroglycerin drip. Becomes pain-free. EKG without acute changes. No elevation of troponins. I discussed the case with Dr. Bo Merino of cardiology. He felt comfortable the patient staying at [facility] and being seen in consultation by cardiology. Discussed with Hospitalist. Patient will be admitted.    Rolland Porter, MD 08/17/15 [phone]

## 2015-08-17 NOTE — Discharge Summary (Signed)
Physician Discharge Summary  [name] WUJ:[id number] DOB: [birth year] DOA: 08/15/2015  PCP: [name], MD  Admit date: 08/15/2015 Discharge date: 08/17/2015   Recommendations for Outpatient Follow-up:  The patient is instructed to follow-up my office within one week's time to assess glycemic control electrolytes hemodynamic control as well as any chest pain which may or may not be ischemic E is to continue all previous medicines including insulin Discharge Diagnoses:  Active Problems:   Diabetes   Atrial fibrillation   Chest pain   Chest pain, rule out acute myocardial infarction   Discharge Condition: Good and improving  Filed Weights   08/15/15 1735 08/15/15 2235 08/16/15 0510  Weight: 170 lb (77.111 kg) 167 lb 15.9 oz (76.2 kg) 167 lb 8.8 oz (76 kg)    History of present illness:  The patient has some cognitive difficulties is admitted with chest pain who was ruled out for myocardial infarction put on IV nitroglycerin drip first course and hospital serial troponins were negative E seen in consultation by cardiology felt was admitted for similar nondescript pain week ago and cleared for discharge his echo reveals ejection fraction 45% with ischemic cardiopathy said previous CABG and PCI interventions he had his medicines continued no further episode of chest pain and cardiology cleared him to go home he'll follow-up my office within one week's time and told to return to ER for chest pain and sitting  Hospital Course:  See history of present illness  Procedures:    Consultations:  Heart etiology  Discharge Instructions     Medication List    ASK your doctor about these medications        albuterol 108 (90 BASE) MCG/ACT inhaler  Commonly known as:  PROVENTIL HFA;VENTOLIN HFA  Inhale 2 puffs into the lungs every 6 (six) hours as needed for wheezing or shortness of breath.     carvedilol 3.125 MG tablet  Commonly known as:  COREG  Take 1 tablet  (3.125 mg total) by mouth 2 (two) times daily with a meal.     clopidogrel 75 MG tablet  Commonly known as:  PLAVIX  Take 1 tablet (75 mg total) by mouth daily.     digoxin 0.125 MG tablet  Commonly known as:  LANOXIN  Take 125 mcg by mouth daily.     furosemide 40 MG tablet  Commonly known as:  LASIX  Take 1 tablet (40 mg total) by mouth every other day.     insulin detemir 100 UNIT/ML injection  Commonly known as:  LEVEMIR  Inject 30 Units into the skin every morning.     isosorbide mononitrate 30 MG 24 hr tablet  Commonly known as:  IMDUR  Take 1 tablet (30 mg total) by mouth daily.     lisinopril 5 MG tablet  Commonly known as:  PRINIVIL,ZESTRIL  Take 1 tablet (5 mg total) by mouth daily.     nitroGLYCERIN 0.4 MG SL tablet  Commonly known as:  NITROSTAT  Place 1 tablet (0.4 mg total) under the tongue every 5 (five) minutes as needed.     potassium chloride SA 20 MEQ tablet  Commonly known as:  K-DUR,KLOR-CON  TAKE ONE TABLET TWICE DAILY     PRADAXA 150 MG Caps capsule  Generic drug:  dabigatran  Take 150 mg by mouth 2 (two) times daily.     ranolazine 1000 MG SR tablet  Commonly known as:  RANEXA  Take 1 tablet (1,000 mg total) by mouth 2 (  two) times daily.     rosuvastatin 40 MG tablet  Commonly known as:  CRESTOR  Take 40 mg by mouth daily.       No Known Allergies    The results of significant diagnostics from this hospitalization (including imaging, microbiology, ancillary and laboratory) are listed below for reference.    Significant Diagnostic Studies: Dg Chest 2 View  08/08/2015   CLINICAL DATA:  Acute onset of generalized chest pain and pressure. Shortness of breath. Initial encounter.  EXAM: CHEST  2 VIEW  COMPARISON:  Chest radiograph performed 06/21/2015  FINDINGS: The lungs are well-aerated. Mild vascular congestion is noted. There is no evidence of focal opacification, pleural effusion or pneumothorax.  The heart is borderline enlarged. The  patient is status post median sternotomy. No acute osseous abnormalities are seen.  IMPRESSION: Mild vascular congestion and borderline cardiomegaly. Lungs remain grossly clear.   Electronically Signed   By: Roanna Raider [name].D.   On: 08/08/2015 21:01   Dg Chest Port 1 View  08/15/2015   CLINICAL DATA:  Chest pain and started 1 hour ago.  EXAM: PORTABLE CHEST - 1 VIEW  COMPARISON:  08/08/2015  FINDINGS: There is no focal parenchymal opacity. There is no pleural effusion or pneumothorax. The heart and mediastinal contours are unremarkable. There is evidence of prior CABG.  The osseous structures are unremarkable.  IMPRESSION: No active disease.   Electronically Signed   By: Elige Ko   On: 08/15/2015 18:17    Microbiology: Recent Results (from the past 240 hour(s))  MRSA PCR Screening     Status: None   Collection Time: 08/15/15 10:16 PM  Result Value Ref Range Status   MRSA by PCR NEGATIVE NEGATIVE Final    Comment:        The GeneXpert MRSA Assay (FDA approved for NASAL specimens only), is one component of a comprehensive MRSA colonization surveillance program. It is not intended to diagnose MRSA infection nor to guide or monitor treatment for MRSA infections.      Labs: Basic Metabolic Panel:  Recent Labs Lab 08/15/15 1751 08/16/15 0316 08/17/15 0609  NA 134* 133* 136  K 4.7 4.5 4.5  CL 102 103 103  CO2 23 22 26   GLUCOSE 239* 292* 181*  BUN 24* 23* 19  CREATININE 2.03* 1.54* 1.32*  CALCIUM 8.7* 8.5* 8.9   Liver Function Tests:  Recent Labs Lab 08/16/15 0316  AST 20  ALT 18  ALKPHOS 49  BILITOT 0.3  PROT 6.9  ALBUMIN 3.9   No results for input(s): LIPASE, AMYLASE in the last 168 hours. No results for input(s): AMMONIA in the last 168 hours. CBC:  Recent Labs Lab 08/15/15 1751 08/16/15 0316  WBC 9.6 9.4  HGB 14.6 14.2  HCT 43.3 42.0  MCV 94.5 94.2  PLT 196 157   Cardiac Enzymes:  Recent Labs Lab 08/15/15 1751 08/15/15 2106 08/16/15 0316  08/16/15 0859  TROPONINI <0.03 <0.03 <0.03 <0.03   BNP: BNP (last 3 results)  Recent Labs  06/10/15 0616 08/08/15 1836  BNP 174.0* 238.0*    ProBNP (last 3 results)  Recent Labs  08/22/14 1937 09/02/14 0030  PROBNP 1437.0* 1831.0*    CBG:  Recent Labs Lab 08/16/15 1127 08/16/15 1627 08/16/15 2027 08/17/15 0752 08/17/15 1108  GLUCAP 233* 227* 243* 175* 245*       Signed:  Maragret Vanacker [name]  [facility] Pager: [phone] 08/17/2015, 11:22 AM

## 2015-08-17 NOTE — Progress Notes (Signed)
1220 d/c instructions & paperwork given to patient. Patient has attempted to call his wife several times to have her pick him up without success of reaching her at this time. Number listed on patient's chart for Clarance Bollard (wife) called & message left to call patient in his room. IV catheter removed from patient's RIGHT forearm, catheter intact, no s/s of infection noted, patient tolerated well w/no c/o pain or discomfort noted. Tele box and wiring removed from patient and central tele notified and made aware of patient's d/c home. Patient in his room awaiting response/ride from wife at this time.

## 2015-08-17 NOTE — Care Management Note (Signed)
Case Management Note  Patient Details  Name: [name] MRN: [id number] Date of Birth: 1946-08-29  Subjective/Objective:                    Action/Plan:   Expected Discharge Date:                  Expected Discharge Plan:  Home/Self Care  In-House Referral:  NA  Discharge planning Services  CM Consult  Post Acute Care Choice:  NA Choice offered to:  NA  DME Arranged:    DME Agency:     HH Arranged:    HH Agency:     Status of Service:  Completed, signed off  Medicare Important Message Given:  Yes-second notification given Date Medicare IM Given:    Medicare IM give by:    Date Additional Medicare IM Given:    Additional Medicare Important Message give by:     If discussed at Long Length of Stay Meetings, dates discussed:    Additional Comments: Anticipate discharge within 24 hours. Pt refuses any HH services. Pt stated that he has all his medications and glucometer for home use.  Arlyss Queen [place], RN 08/17/2015, 10:12 AM

## 2015-08-17 NOTE — Care Management Important Message (Signed)
Important Message  Patient Details  Name: [name] MRN: [id number] Date of Birth: 22-Feb-1946   Medicare Important Message Given:  Yes-second notification given    Cheryl Flash, RN 08/17/2015, 10:12 AM

## 2015-08-18 LAB — HEMOGLOBIN A1C
HEMOGLOBIN A1C: 7.8 % — AB (ref 4.8–5.6)
Hgb A1c MFr Bld: 8 % — ABNORMAL HIGH (ref 4.8–5.6)
MEAN PLASMA GLUCOSE: 177 mg/dL
MEAN PLASMA GLUCOSE: 183 mg/dL

## 2015-08-24 ENCOUNTER — Encounter: Payer: Medicare Other | Admitting: Adult Health

## 2015-08-24 NOTE — Progress Notes (Signed)
Cardiology Office Note   Date:  08/24/2015   ID:  [name], DOB [birth year], MRN [id number]  PCP:  [name], MD  Cardiologist: [place]/  [name], NP   No chief complaint on file.  ERROR  NO SHOW

## 2015-08-29 ENCOUNTER — Encounter (HOSPITAL_COMMUNITY): Payer: Self-pay | Admitting: Emergency Medicine

## 2015-08-29 ENCOUNTER — Emergency Department (HOSPITAL_COMMUNITY)
Admission: EM | Admit: 2015-08-29 | Discharge: 2015-08-29 | Disposition: A | Discharge status: Home / Self Care | Payer: Medicare Other | Attending: Emergency Medicine | Admitting: Emergency Medicine

## 2015-08-29 DIAGNOSIS — I5022 Chronic systolic (congestive) heart failure: Secondary | ICD-10-CM | POA: Diagnosis not present

## 2015-08-29 DIAGNOSIS — Z720 Tobacco use: Secondary | ICD-10-CM | POA: Insufficient documentation

## 2015-08-29 DIAGNOSIS — R079 Chest pain, unspecified: Secondary | ICD-10-CM | POA: Diagnosis present

## 2015-08-29 DIAGNOSIS — Z79899 Other long term (current) drug therapy: Secondary | ICD-10-CM | POA: Insufficient documentation

## 2015-08-29 DIAGNOSIS — Z9119 Patient's noncompliance with other medical treatment and regimen: Secondary | ICD-10-CM | POA: Diagnosis not present

## 2015-08-29 DIAGNOSIS — Z8739 Personal history of other diseases of the musculoskeletal system and connective tissue: Secondary | ICD-10-CM | POA: Diagnosis not present

## 2015-08-29 DIAGNOSIS — I4891 Unspecified atrial fibrillation: Secondary | ICD-10-CM | POA: Diagnosis not present

## 2015-08-29 DIAGNOSIS — Z7902 Long term (current) use of antithrombotics/antiplatelets: Secondary | ICD-10-CM | POA: Diagnosis not present

## 2015-08-29 DIAGNOSIS — E119 Type 2 diabetes mellitus without complications: Secondary | ICD-10-CM | POA: Diagnosis not present

## 2015-08-29 DIAGNOSIS — I25119 Atherosclerotic heart disease of native coronary artery with unspecified angina pectoris: Secondary | ICD-10-CM | POA: Diagnosis not present

## 2015-08-29 DIAGNOSIS — Z7901 Long term (current) use of anticoagulants: Secondary | ICD-10-CM | POA: Insufficient documentation

## 2015-08-29 DIAGNOSIS — Z9889 Other specified postprocedural states: Secondary | ICD-10-CM | POA: Diagnosis not present

## 2015-08-29 DIAGNOSIS — Z951 Presence of aortocoronary bypass graft: Secondary | ICD-10-CM | POA: Insufficient documentation

## 2015-08-29 DIAGNOSIS — E785 Hyperlipidemia, unspecified: Secondary | ICD-10-CM | POA: Insufficient documentation

## 2015-08-29 DIAGNOSIS — Z794 Long term (current) use of insulin: Secondary | ICD-10-CM | POA: Diagnosis not present

## 2015-08-29 DIAGNOSIS — I208 Other forms of angina pectoris: Secondary | ICD-10-CM

## 2015-08-29 LAB — I-STAT CHEM 8, ED
BUN: 24 mg/dL — ABNORMAL HIGH (ref 6–20)
CALCIUM ION: 1.17 mmol/L (ref 1.13–1.30)
CHLORIDE: 105 mmol/L (ref 101–111)
CREATININE: 1.4 mg/dL — AB (ref 0.61–1.24)
GLUCOSE: 75 mg/dL (ref 65–99)
HCT: 40 % (ref 39.0–52.0)
Hemoglobin: 13.6 g/dL (ref 13.0–17.0)
Potassium: 4.7 mmol/L (ref 3.5–5.1)
Sodium: 137 mmol/L (ref 135–145)
TCO2: 20 mmol/L (ref 0–100)

## 2015-08-29 LAB — I-STAT TROPONIN, ED: TROPONIN I, POC: 0.01 ng/mL (ref 0.00–0.08)

## 2015-08-29 MED ORDER — BISMUTH SUBSALICYLATE 262 MG PO CHEW
524.0000 mg | CHEWABLE_TABLET | ORAL | Status: DC | PRN
  Filled 2015-08-29: qty 2

## 2015-08-29 MED ORDER — TRAMADOL HCL 50 MG PO TABS
50.0000 mg | ORAL_TABLET | Freq: Four times a day (QID) | ORAL | Status: DC | PRN
Start: 2015-08-29 — End: 2015-12-12

## 2015-08-29 MED ORDER — SODIUM CHLORIDE 0.9 % IV SOLN
INTRAVENOUS | Status: DC
  Administered 2015-08-29: 18:00:00 via INTRAVENOUS

## 2015-08-29 MED ORDER — TRAMADOL HCL 50 MG PO TABS
50.0000 mg | ORAL_TABLET | Freq: Once | ORAL | Status: AC
  Administered 2015-08-29: 50 mg via ORAL
  Filled 2015-08-29: qty 1

## 2015-08-29 MED ORDER — SODIUM CHLORIDE 0.9 % IV BOLUS (SEPSIS)
500.0000 mL | Freq: Once | INTRAVENOUS | Status: AC
  Administered 2015-08-29: 500 mL via INTRAVENOUS

## 2015-08-29 NOTE — ED Notes (Signed)
BP reported to dr. Effie Shy.

## 2015-08-29 NOTE — ED Notes (Signed)
Per EMS: Pt reports sudden onset cp 40 minutes ago, wife gave 3 nitros with success.  Pt has no pain at this time.  Pt reported pressure when it did hurt.   95 cbg, lungs clear, hx a-fib, 90/60,

## 2015-08-29 NOTE — ED Notes (Signed)
Pt given meal tray.

## 2015-08-29 NOTE — Discharge Instructions (Signed)
Angina Pectoris  Angina pectoris, often just called angina, is extreme discomfort in your chest, neck, or arm caused by a lack of blood in the middle and thickest layer of your heart wall (myocardium). It may feel like tightness or heavy pressure. It may feel like a crushing or squeezing pain. Some people say it feels like gas or indigestion. It may go down your shoulders, back, and arms. Some people may have symptoms other than pain. These symptoms include fatigue, shortness of breath, cold sweats, or nausea. There are four different types of angina:  · Stable angina--Stable angina usually occurs in episodes of predictable frequency and duration. It usually is brought on by physical activity, emotional stress, or excitement. These are all times when the myocardium needs more oxygen. Stable angina usually lasts a few minutes and often is relieved by taking a medicine that can be taken under your tongue (sublingually). The medicine is called nitroglycerin. Stable angina is caused by a buildup of plaque inside the arteries, which restricts blood flow to the heart muscle (atherosclerosis).  · Unstable angina--Unstable angina can occur even when your body experiences little or no physical exertion. It can occur during sleep. It can also occur at rest. It can suddenly increase in severity or frequency. It might not be relieved by sublingual nitroglycerin. It can last up to 30 minutes. The most common cause of unstable angina is a blood clot that has developed on the top of plaque buildup inside a coronary artery. It can lead to a heart attack if the blood clot completely blocks the artery.  · Microvascular angina--This type of angina is caused by a disorder of tiny blood vessels called arterioles. Microvascular angina is more common in women. The pain may be more severe and last longer than other types of angina pectoris.  · Prinzmetal or variant angina--This type of angina pectoris usually occurs when your body  experiences little or no physical exertion. It especially occurs in the early morning hours. It is caused by a spasm of your coronary artery.  HOME CARE INSTRUCTIONS   · Only take over-the-counter and prescription medicines as directed by your health care provider.  · Stay active or increase your exercise as directed by your health care provider.  · Limit strenuous activity as directed by your health care provider.  · Limit heavy lifting as directed by your health care provider.  · Maintain a healthy weight.  · Learn about and eat heart-healthy foods.  · Do not use any tobacco products including cigarettes, chewing tobacco or electronic cigarettes.  SEEK IMMEDIATE MEDICAL CARE IF:   You experience the following symptoms:  · Chest, neck, deep shoulder, or arm pain or discomfort that lasts more than a few minutes.  · Chest, neck, deep shoulder, or arm pain or discomfort that goes away and comes back, repeatedly.  · Heavy sweating with discomfort, without a noticeable cause.  · Shortness of breath or difficulty breathing.  · Angina that does not get better after a few minutes of rest or after taking sublingual nitroglycerin.  These can all be symptoms of a heart attack, which is a medical emergency! Get medical help at once. Call your local emergency service (911 in [place] immediately. Do not  drive yourself to the hospital and do not  wait to for your symptoms to go away.  MAKE SURE YOU:  · Understand these instructions.  · Will watch your condition.  · Will get help right away if you are not   doing well or get worse.  Document Released: 12/08/2005 Document Revised: 12/13/2013 Document Reviewed: 04/11/2014  [employer]® Patient Information ©2015 [employer]. This information is not intended to replace advice given to you by your health care provider. Make sure you discuss any questions you have with your health care provider.

## 2015-08-29 NOTE — ED Provider Notes (Signed)
CSN: [id number]     Arrival date & time 08/29/15  1709 History   First MD Initiated Contact with Patient 08/29/15 1720     Chief Complaint  Patient presents with  . Chest Pain     (Consider location/radiation/quality/duration/timing/severity/associated sxs/prior Treatment) HPI   [name] is a 69 y.o. male who presents for evaluation of an episode of chest pain, which is typical of his usual recurrent chronic angina. Initially the pain was 5-6/10, it is now 2/10 and he describes this level of pain as "usual". For the pain. He was treated with nitroglycerin, 3, by his wife. Is not clear if this nitroglycerin helped his pain or not. He denies diaphoresis or dizziness. He did have some shortness of breath while he was having chest pain. He continues to smoke cigarettes, even though he knows that he should stop. Last time he had chest pain was when he was evaluated here for pain on 08/15/2015. At that time had a comprehensive workup including CT angiogram of the chest. He was admitted to the hospital. He was also seen in the ED on 08/08/2015, and admitted. On this visit, his medications were changed, stopping aspirin starting Pradaxa and Plavix and his Ranexa was titrated up. On the more recent hospitalization, his lisinopril and digoxin, were both decreased, because of persistent hypotension. There were no other interventions on either hospitalization. He is taking his usual medications. There are no other known modifying factors.   Past Medical History  Diagnosis Date  . Coronary atherosclerosis of native coronary artery     Ant. MI in 4/95; PTCA of 90% prox & distal LAD unsuccessful-->  Urgent CABG-4/95 TO Cx; 50% RCA; ant. HK; EF of 50-55%; 08/2008: 3-V disease plus a  95% LIMA stenosis, treated with DES; occlusion of SVG to CX; RCA SVG stenosis--> PCI with DES  2010: in-stent restenosis in the LIMA-->cutting balloon; EF of 35-40%;  07/2010: TO of SVG to RCA-medical therapy advised  . Atrial  fibrillation     1995, 2009; bradycardia with beta blocker therapy  . Peripheral vascular disease 1995    Abdominal aortic obstruction 1995-->vascular surgery  . Hyperlipidemia   . Type 2 diabetes mellitus   . DDD (degenerative disc disease)     Discectomy and fusion at L4 and L5  . History of alcohol abuse     Quit in 1999  . Chronic systolic heart failure   . Non-compliance   . Ischemic cardiomyopathy     LVEF 40-45% August 2016   Past Surgical History  Procedure Laterality Date  . Cholecystectomy  1995  . Lumbar fusion      +Discectomy;x2; L4 and L5  . Aorto-femoral bypass graft  1995  . Left heart catheterization with coronary angiogram N/A 12/14/2013    Procedure: LEFT HEART CATHETERIZATION WITH CORONARY ANGIOGRAM;  Surgeon: Micheline Chapman, MD;  Location: [facility] CATH LAB;  Service: Cardiovascular;  Laterality: N/A;  . Back surgery     Family History  Problem Relation Age of Onset  . CAD Father    Social History  Substance Use Topics  . Smoking status: Current Every Day Smoker -- 0.50 packs/day for 40 years    Types: Cigarettes  . Smokeless tobacco: Current User     Comment: refused  . Alcohol Use: No     Comment: former    Review of Systems  All other systems reviewed and are negative.     Allergies  Review of patient's allergies indicates no known  allergies.  Home Medications   Prior to Admission medications   Medication Sig Start Date End Date Taking? Authorizing Provider  albuterol (PROVENTIL HFA;VENTOLIN HFA) 108 (90 BASE) MCG/ACT inhaler Inhale 2 puffs into the lungs every 6 (six) hours as needed for wheezing or shortness of breath.   Yes Historical Provider, MD  carvedilol (COREG) 3.125 MG tablet Take 1 tablet (3.125 mg total) by mouth 2 (two) times daily with a meal. 08/10/15  Yes Brittainy Sherlynn Carbon, PA-C  clopidogrel (PLAVIX) 75 MG tablet Take 1 tablet (75 mg total) by mouth daily. 08/10/15  Yes Brittainy Sherlynn Carbon, PA-C  dabigatran (PRADAXA) 150 MG  CAPS capsule Take 150 mg by mouth 2 (two) times daily.   Yes Historical Provider, MD  digoxin (LANOXIN) 0.125 MG tablet Take 125 mcg by mouth daily.     Yes Historical Provider, MD  furosemide (LASIX) 40 MG tablet Take 1 tablet (40 mg total) by mouth every other day. 11/21/14  Yes Antoine Poche, MD  insulin aspart (NOVOLOG FLEXPEN) 100 UNIT/ML FlexPen Inject 20 Units into the skin 3 (three) times daily with meals.   Yes Historical Provider, MD  insulin detemir (LEVEMIR) 100 UNIT/ML injection Inject 40 Units into the skin daily.    Yes Historical Provider, MD  isosorbide mononitrate (IMDUR) 30 MG 24 hr tablet Take 1 tablet (30 mg total) by mouth daily. 09/08/13  Yes Angus [name], MD  lisinopril (PRINIVIL,ZESTRIL) 5 MG tablet Take 1 tablet (5 mg total) by mouth daily. 02/28/14  Yes Antoine Poche, MD  nitroGLYCERIN (NITROSTAT) 0.4 MG SL tablet Place 1 tablet (0.4 mg total) under the tongue every 5 (five) minutes as needed. Patient taking differently: Place 0.4 mg under the tongue every 5 (five) minutes as needed for chest pain.  08/10/15  Yes Brittainy Sherlynn Carbon, PA-C  potassium chloride SA (K-DUR,KLOR-CON) 20 MEQ tablet TAKE ONE TABLET TWICE DAILY 08/08/15  Yes Antoine Poche, MD  ranolazine (RANEXA) 1000 MG SR tablet Take 1 tablet (1,000 mg total) by mouth 2 (two) times daily. 08/10/15  Yes Brittainy Sherlynn Carbon, PA-C  rosuvastatin (CRESTOR) 40 MG tablet Take 40 mg by mouth daily.   Yes Historical Provider, MD  traMADol (ULTRAM) 50 MG tablet Take 1 tablet (50 mg total) by mouth every 6 (six) hours as needed. 08/29/15   Mancel Bale, MD   BP 106/69 mmHg  Pulse 85  Temp(Src) 97.4 F (36.3 C) (Oral)  Resp 13  Ht  (1.753 m)  Wt 170 lb (77.111 kg)  BMI 25.09 kg/m2  SpO2 100% Physical Exam  Constitutional: He is oriented to person, place, and time. He appears well-developed. No distress.  Appears older than stated age. Very poor hygiene, with dirt caked on skin of trunk.  HENT:  Head:  Normocephalic and atraumatic.  Right Ear: External ear normal.  Left Ear: External ear normal.  Eyes: Conjunctivae and EOM are normal. Pupils are equal, round, and reactive to light.  Neck: Normal range of motion and phonation normal. Neck supple.  Cardiovascular: Normal rate, regular rhythm and normal heart sounds.   Pulmonary/Chest: Effort normal and breath sounds normal. He exhibits no bony tenderness.  Abdominal: Soft. There is no tenderness.  Musculoskeletal: Normal range of motion.  Neurological: He is alert and oriented to person, place, and time. No cranial nerve deficit or sensory deficit. He exhibits normal muscle tone. Coordination normal.  Skin: Skin is warm, dry and intact.  Psychiatric: He has a normal mood and affect. His  behavior is normal.  Nursing note and vitals reviewed.   ED Course  Procedures (including critical care time) Initial evaluation consistent with chronic angina. Will assess for occult MI with a screening troponin, and discharge if reassuring. Patient has a chronically low level elevated troponin.  Medications  0.9 %  sodium chloride infusion ( Intravenous Stopped 08/29/15 1835)  bismuth subsalicylate (PEPTO BISMOL) chewable tablet 524 mg (not administered)  sodium chloride 0.9 % bolus 500 mL (0 mLs Intravenous Stopped 08/29/15 1835)  traMADol (ULTRAM) tablet 50 mg (50 mg Oral Given 08/29/15 1740)    Patient Vitals for the past 24 hrs:  BP Temp Temp src Pulse Resp SpO2 Height Weight  08/29/15 1900 106/69 mmHg - - 85 13 100 % - -  08/29/15 1837 - - - - 13 - - -  08/29/15 1836 (!) 103/47 mmHg - - 76 - 98 % - -  08/29/15 1815 - - - (!) 56 - 99 % - -  08/29/15 1800 119/76 mmHg - - - - - - -  08/29/15 1745 - - - 70 15 - - -  08/29/15 1730 100/61 mmHg - - - - 99 % - -  08/29/15 1715 97/63 mmHg - - - - - - -  08/29/15 1713 97/63 mmHg 97.4 F (36.3 C) Oral (!) 53 26 99 % 5\' 9"  (1.753 m) 170 lb (77.111 kg)    5:50 PM Reevaluation with update and discussion.  After initial assessment and treatment, an updated evaluation reveals he is eating heartily. Ezekial Arns L   19:05- he remains comfortable, is sleeping, but arouses easily. No worsening of chest pain. His blood pressure has trended over 100 systolic   Labs Review Labs Reviewed  I-STAT CHEM 8, ED - Abnormal; Notable for the following:    BUN 24 (*)    Creatinine, Ser 1.40 (*)    All other components within normal limits  I-STAT TROPOININ, ED    Imaging Review No results found. I have personally reviewed and evaluated these images and lab results as part of my medical decision-making.   EKG Interpretation   Date/Time:  Wednesday August 29 2015 17:17:09 EDT Ventricular Rate:  51 PR Interval:    QRS Duration: 84 QT Interval:  453 QTC Calculation: 417 R Axis:   -16 Text Interpretation:  Atrial flutter Anterior infarct, old Borderline  repolarization abnormality since last tracing no significant change  Confirmed by Effie Shy  MD, Cynthea Zachman [phone]) on 08/29/2015 6:14:28 PM      MDM   Final diagnoses:  Stable angina    Evaluation consistent with chronic stable angina. Doubt ACS, PE, pneumonia, or metabolic instability. Transient hypotension, related to use of nitroglycerin. Will add narcotic pain reliever. Patient continues to smoke. He also has very poor hygiene.  Nursing Notes Reviewed/ Care Coordinated Applicable Imaging Reviewed Interpretation of Laboratory Data incorporated into ED treatment  The patient appears reasonably screened and/or stabilized for discharge and I doubt any other medical condition or other [facility] requiring further screening, evaluation, or treatment in the ED at this time prior to discharge.  Plan: Home Medications- Tramadol; Home Treatments- rest, stop smoking; return here if the recommended treatment, does not improve the symptoms; Recommended follow up- PCP prn     Mancel Bale, MD 08/29/15 [phone]

## 2015-09-02 ENCOUNTER — Emergency Department (HOSPITAL_COMMUNITY): Payer: Medicare Other

## 2015-09-02 ENCOUNTER — Encounter (HOSPITAL_COMMUNITY): Payer: Self-pay | Admitting: *Deleted

## 2015-09-02 ENCOUNTER — Emergency Department (HOSPITAL_COMMUNITY)
Admission: EM | Admit: 2015-09-02 | Discharge: 2015-09-03 | Disposition: A | Discharge status: Home / Self Care | Payer: Medicare Other | Attending: Emergency Medicine | Admitting: Emergency Medicine

## 2015-09-02 DIAGNOSIS — I5022 Chronic systolic (congestive) heart failure: Secondary | ICD-10-CM | POA: Insufficient documentation

## 2015-09-02 DIAGNOSIS — E785 Hyperlipidemia, unspecified: Secondary | ICD-10-CM | POA: Diagnosis not present

## 2015-09-02 DIAGNOSIS — Z720 Tobacco use: Secondary | ICD-10-CM | POA: Insufficient documentation

## 2015-09-02 DIAGNOSIS — Z9119 Patient's noncompliance with other medical treatment and regimen: Secondary | ICD-10-CM | POA: Diagnosis not present

## 2015-09-02 DIAGNOSIS — I4891 Unspecified atrial fibrillation: Secondary | ICD-10-CM | POA: Diagnosis not present

## 2015-09-02 DIAGNOSIS — Z7902 Long term (current) use of antithrombotics/antiplatelets: Secondary | ICD-10-CM | POA: Insufficient documentation

## 2015-09-02 DIAGNOSIS — Z79899 Other long term (current) drug therapy: Secondary | ICD-10-CM | POA: Diagnosis not present

## 2015-09-02 DIAGNOSIS — Z7901 Long term (current) use of anticoagulants: Secondary | ICD-10-CM | POA: Diagnosis not present

## 2015-09-02 DIAGNOSIS — R079 Chest pain, unspecified: Secondary | ICD-10-CM | POA: Diagnosis present

## 2015-09-02 DIAGNOSIS — E119 Type 2 diabetes mellitus without complications: Secondary | ICD-10-CM | POA: Diagnosis not present

## 2015-09-02 DIAGNOSIS — Z951 Presence of aortocoronary bypass graft: Secondary | ICD-10-CM | POA: Insufficient documentation

## 2015-09-02 DIAGNOSIS — I208 Other forms of angina pectoris: Secondary | ICD-10-CM

## 2015-09-02 DIAGNOSIS — Z9889 Other specified postprocedural states: Secondary | ICD-10-CM | POA: Insufficient documentation

## 2015-09-02 DIAGNOSIS — I25119 Atherosclerotic heart disease of native coronary artery with unspecified angina pectoris: Secondary | ICD-10-CM | POA: Insufficient documentation

## 2015-09-02 DIAGNOSIS — Z794 Long term (current) use of insulin: Secondary | ICD-10-CM | POA: Diagnosis not present

## 2015-09-02 LAB — CBC WITH DIFFERENTIAL/PLATELET
Basophils Absolute: 0.1 10*3/uL (ref 0.0–0.1)
Basophils Relative: 1 % (ref 0–1)
Eosinophils Absolute: 0.2 10*3/uL (ref 0.0–0.7)
Eosinophils Relative: 2 % (ref 0–5)
HEMATOCRIT: 42.3 % (ref 39.0–52.0)
HEMOGLOBIN: 14.3 g/dL (ref 13.0–17.0)
LYMPHS ABS: 2.6 10*3/uL (ref 0.7–4.0)
Lymphocytes Relative: 26 % (ref 12–46)
MCH: 31.5 pg (ref 26.0–34.0)
MCHC: 33.8 g/dL (ref 30.0–36.0)
MCV: 93.2 fL (ref 78.0–100.0)
MONOS PCT: 8 % (ref 3–12)
Monocytes Absolute: 0.8 10*3/uL (ref 0.1–1.0)
NEUTROS ABS: 6.1 10*3/uL (ref 1.7–7.7)
NEUTROS PCT: 63 % (ref 43–77)
Platelets: 164 10*3/uL (ref 150–400)
RBC: 4.54 MIL/uL (ref 4.22–5.81)
RDW: 13.1 % (ref 11.5–15.5)
WBC: 9.8 10*3/uL (ref 4.0–10.5)

## 2015-09-02 NOTE — ED Notes (Signed)
Pt c/o mid center chest pain described as a dullness that started about a hour ago, pain is associated with sob, denies any radiation

## 2015-09-02 NOTE — ED Provider Notes (Signed)
CSN: [id number]   Arrival date & time 09/02/15 2316  History  This chart was scribed for Shon Baton, MD by Bethel Born, ED Scribe. This patient was seen in room APA08/APA08 and the patient's care was started at 11:50 PM.  Chief Complaint  Patient presents with  . Chest Pain    HPI The history is provided by the patient. No language interpreter was used.   [name] is a 69 y.o. male with PMHx of ischemic cardiomyopathy with LVEF of 40-45% in August of 2016, chronic systolic heart failure, a-fib, CAD s/p PCI and CABG , DM, HLD, and peripheral vascular disease who presents to the Emergency Department complaining of chest pain with sudden onset 1 hour ago while watching television. The pain is described as sharp and typical of his usual chest pain. Pt rates the pain 6+/10 in severity at present and 8/10 in severity at home. NTG provided some relief PTA.  Associated symptoms include SOB. Pt denies sweating. He has not taken aspirin today. Tobacco+.   Of note, patient had 2 recent admissions at the end of August with full workup including CT angiogram of the chest. He has not had any further cardiac intervention. He has been advised multiple times to quit smoking.  He is on Pradaxa and Plavix.  Past Medical History  Diagnosis Date  . Coronary atherosclerosis of native coronary artery     Ant. MI in 4/95; PTCA of 90% prox & distal LAD unsuccessful-->  Urgent CABG-4/95 TO Cx; 50% RCA; ant. HK; EF of 50-55%; 08/2008: 3-V disease plus a  95% LIMA stenosis, treated with DES; occlusion of SVG to CX; RCA SVG stenosis--> PCI with DES  2010: in-stent restenosis in the LIMA-->cutting balloon; EF of 35-40%;  07/2010: TO of SVG to RCA-medical therapy advised  . Atrial fibrillation     1995, 2009; bradycardia with beta blocker therapy  . Peripheral vascular disease 1995    Abdominal aortic obstruction 1995-->vascular surgery  . Hyperlipidemia   . Type 2 diabetes mellitus   . DDD (degenerative  disc disease)     Discectomy and fusion at L4 and L5  . History of alcohol abuse     Quit in 1999  . Chronic systolic heart failure   . Non-compliance   . Ischemic cardiomyopathy     LVEF 40-45% August 2016    Past Surgical History  Procedure Laterality Date  . Cholecystectomy  1995  . Lumbar fusion      +Discectomy;x2; L4 and L5  . Aorto-femoral bypass graft  1995  . Left heart catheterization with coronary angiogram N/A 12/14/2013    Procedure: LEFT HEART CATHETERIZATION WITH CORONARY ANGIOGRAM;  Surgeon: Micheline Chapman, MD;  Location: [facility] CATH LAB;  Service: Cardiovascular;  Laterality: N/A;  . Back surgery      Family History  Problem Relation Age of Onset  . CAD Father     Social History  Substance Use Topics  . Smoking status: Current Every Day Smoker -- 0.50 packs/day for 40 years    Types: Cigarettes  . Smokeless tobacco: Current User     Comment: refused  . Alcohol Use: No     Comment: former     Review of Systems  Constitutional: Negative.  Negative for fever.  Respiratory: Positive for chest tightness and shortness of breath. Negative for cough.   Cardiovascular: Positive for chest pain. Negative for leg swelling.  Gastrointestinal: Negative.  Negative for vomiting, abdominal pain and diarrhea.  Genitourinary:  Negative.  Negative for dysuria.  Musculoskeletal: Negative for back pain.  Neurological: Negative for headaches.  All other systems reviewed and are negative.  Home Medications   Prior to Admission medications   Medication Sig Start Date End Date Taking? Authorizing Provider  albuterol (PROVENTIL HFA;VENTOLIN HFA) 108 (90 BASE) MCG/ACT inhaler Inhale 2 puffs into the lungs every 6 (six) hours as needed for wheezing or shortness of breath.    Historical Provider, MD  carvedilol (COREG) 3.125 MG tablet Take 1 tablet (3.125 mg total) by mouth 2 (two) times daily with a meal. 08/10/15   Brittainy Sherlynn Carbon, PA-C  clopidogrel (PLAVIX) 75 MG tablet Take 1  tablet (75 mg total) by mouth daily. 08/10/15   Brittainy Sherlynn Carbon, PA-C  dabigatran (PRADAXA) 150 MG CAPS capsule Take 150 mg by mouth 2 (two) times daily.    Historical Provider, MD  digoxin (LANOXIN) 0.125 MG tablet Take 125 mcg by mouth daily.      Historical Provider, MD  furosemide (LASIX) 40 MG tablet Take 1 tablet (40 mg total) by mouth every other day. 11/21/14   Antoine Poche, MD  insulin aspart (NOVOLOG FLEXPEN) 100 UNIT/ML FlexPen Inject 20 Units into the skin 3 (three) times daily with meals.    Historical Provider, MD  insulin detemir (LEVEMIR) 100 UNIT/ML injection Inject 40 Units into the skin daily.     Historical Provider, MD  isosorbide mononitrate (IMDUR) 30 MG 24 hr tablet Take 1 tablet (30 mg total) by mouth daily. 09/08/13   Butch Penny, MD  lisinopril (PRINIVIL,ZESTRIL) 5 MG tablet Take 1 tablet (5 mg total) by mouth daily. 02/28/14   Antoine Poche, MD  nitroGLYCERIN (NITROSTAT) 0.4 MG SL tablet Place 1 tablet (0.4 mg total) under the tongue every 5 (five) minutes as needed. Patient taking differently: Place 0.4 mg under the tongue every 5 (five) minutes as needed for chest pain.  08/10/15   Brittainy Sherlynn Carbon, PA-C  potassium chloride SA (K-DUR,KLOR-CON) 20 MEQ tablet TAKE ONE TABLET TWICE DAILY 08/08/15   Antoine Poche, MD  ranolazine (RANEXA) 1000 MG SR tablet Take 1 tablet (1,000 mg total) by mouth 2 (two) times daily. 08/10/15   Brittainy Sherlynn Carbon, PA-C  rosuvastatin (CRESTOR) 40 MG tablet Take 40 mg by mouth daily.    Historical Provider, MD  traMADol (ULTRAM) 50 MG tablet Take 1 tablet (50 mg total) by mouth every 6 (six) hours as needed. 08/29/15   Mancel Bale, MD    Allergies  Review of patient's allergies indicates no known allergies.  Triage Vitals: BP 123/78 mmHg  Pulse 50  Resp 16  Ht 5\' 9"  (1.753 m)  Wt 170 lb (77.111 kg)  BMI 25.09 kg/m2  SpO2 100%  Physical Exam  Constitutional: He is oriented to person, place, and time.  Poor hygiene,  smells of smoke  HENT:  Head: Normocephalic and atraumatic.  Eyes: Pupils are equal, round, and reactive to light.  Cardiovascular: Normal rate, regular rhythm and normal heart sounds.   No murmur heard. Pulmonary/Chest: Effort normal. No respiratory distress. He has no wheezes.  Coarse breath sounds with fair air movement Well healed midline sternotomy scar  Abdominal: Soft. Bowel sounds are normal. There is no tenderness. There is no rebound.  Musculoskeletal: He exhibits no edema.  Neurological: He is alert and oriented to person, place, and time.  Skin: Skin is warm and dry.  Psychiatric: He has a normal mood and affect.  Nursing note and vitals reviewed.  ED Course  Procedures   DIAGNOSTIC STUDIES: Oxygen Saturation is 100% on RA, normal by my interpretation.    COORDINATION OF CARE: 11:53 PM Discussed treatment plan which includes CXR, EKG, and lab work with pt at bedside and pt agreed to plan.  Labs Reviewed  BASIC METABOLIC PANEL - Abnormal; Notable for the following:    Glucose, Bld 209 (*)    Creatinine, Ser 1.35 (*)    GFR calc non Af Amer 52 (*)    All other components within normal limits  DIGOXIN LEVEL - Abnormal; Notable for the following:    Digoxin Level 0.6 (*)    All other components within normal limits  CBC WITH DIFFERENTIAL/PLATELET  BRAIN NATRIURETIC PEPTIDE  TROPONIN I  TROPONIN I    Imaging Review Dg Chest Portable 1 View  09/03/2015   CLINICAL DATA:  69 year old male with chest pain  EXAM: PORTABLE CHEST - 1 VIEW  COMPARISON:  Radiograph dated 08/15/2015  FINDINGS: Single-view of the chest demonstrate emphysematous changes. No focal consolidation, pleural effusion, or pneumothorax. Stable cardiomegaly. Median sternotomy wires and CABG clips.  IMPRESSION: No acute cardiopulmonary process.  No interval change.   Electronically Signed   By: Elgie Collard M.D.   On: 09/03/2015 01:49    EKG Interpretation  Date/Time:  Sunday September 02 2015  23:27:03 EDT Ventricular Rate:  73 PR Interval:    QRS Duration: 79 QT Interval:  454 QTC Calculation: 500 R Axis:   -43 Text Interpretation:  Atrial fibrillation Abnormal inferior Q waves Probable anterolateral infarct, age indeterm Prolonged QT interval New T wave inversions laterally Confirmed by Marijke Guadiana  MD, [place] Masaki ([id number]) on 09/02/2015 11:35:25 PM    MDM   Final diagnoses:  Stable angina   Patient presents with chest pain. Chest pain similar to prior stable angina. Patient continues to smoke. Nontoxic. Initial EKG shows nonspecific T-wave inversions laterally. This is new from prior but was seen on EKG from August 18. Patient has had several recent admission with medication changes and has had full workup including CT MG a. Patient was given morphine. Blood pressure does not tolerate nitroglycerin.  Workup reassuring. Digoxin level low at 0.6. Troponin 2 negative. Given recent multiple admissions and serial enzymes that are negative, feel patient can be discharged home with close cardiology follow-up. I again discussed with patient the importance of discontinuing smoking. On recheck prior to discharge, patient reports that he feels better and is not having any ongoing pain.  After history, exam, and medical workup I feel the patient has been appropriately medically screened and is safe for discharge home. Pertinent diagnoses were discussed with the patient. Patient was given return precautions.  I personally performed the services described in this documentation, which was scribed in my presence. The recorded information has been reviewed and is accurate.    Shon Baton, MD 09/03/15 [phone]

## 2015-09-03 DIAGNOSIS — I25119 Atherosclerotic heart disease of native coronary artery with unspecified angina pectoris: Secondary | ICD-10-CM | POA: Diagnosis not present

## 2015-09-03 LAB — BASIC METABOLIC PANEL
ANION GAP: 7 (ref 5–15)
BUN: 17 mg/dL (ref 6–20)
CHLORIDE: 104 mmol/L (ref 101–111)
CO2: 24 mmol/L (ref 22–32)
CREATININE: 1.35 mg/dL — AB (ref 0.61–1.24)
Calcium: 9.1 mg/dL (ref 8.9–10.3)
GFR calc non Af Amer: 52 mL/min — ABNORMAL LOW (ref >60)
Glucose, Bld: 209 mg/dL — ABNORMAL HIGH (ref 65–99)
Potassium: 4.4 mmol/L (ref 3.5–5.1)
Sodium: 135 mmol/L (ref 135–145)

## 2015-09-03 LAB — TROPONIN I

## 2015-09-03 LAB — BRAIN NATRIURETIC PEPTIDE: B Natriuretic Peptide: 83 pg/mL (ref 0.0–100.0)

## 2015-09-03 LAB — DIGOXIN LEVEL: DIGOXIN LVL: 0.6 ng/mL — AB (ref 0.8–2.0)

## 2015-09-03 MED ORDER — NITROGLYCERIN 2 % TD OINT
1.0000 [in_us] | TOPICAL_OINTMENT | Freq: Once | TRANSDERMAL | Status: AC
  Administered 2015-09-03: 1 [in_us] via TOPICAL
  Filled 2015-09-03: qty 1

## 2015-09-03 MED ORDER — MORPHINE SULFATE (PF) 4 MG/ML IV SOLN
2.0000 mg | Freq: Once | INTRAVENOUS | Status: AC
  Administered 2015-09-03: 2 mg via INTRAVENOUS
  Filled 2015-09-03: qty 1

## 2015-09-03 NOTE — Discharge Instructions (Signed)
You were seen for chest pain. Your chest pain is consistent with your stable angina and your workup is reassuring.  It is very important that you quit smoking. You need follow-up with your cardiologist as soon as possible.  Angina Pectoris Angina pectoris, often just called angina, is extreme discomfort in your chest, neck, or arm caused by a lack of blood in the middle and thickest layer of your heart wall (myocardium). It may feel like tightness or heavy pressure. It may feel like a crushing or squeezing pain. Some people say it feels like gas or indigestion. It may go down your shoulders, back, and arms. Some people may have symptoms other than pain. These symptoms include fatigue, shortness of breath, cold sweats, or nausea. There are four different types of angina:  Stable angina--Stable angina usually occurs in episodes of predictable frequency and duration. It usually is brought on by physical activity, emotional stress, or excitement. These are all times when the myocardium needs more oxygen. Stable angina usually lasts a few minutes and often is relieved by taking a medicine that can be taken under your tongue (sublingually). The medicine is called nitroglycerin. Stable angina is caused by a buildup of plaque inside the arteries, which restricts blood flow to the heart muscle (atherosclerosis).  Unstable angina--Unstable angina can occur even when your body experiences little or no physical exertion. It can occur during sleep. It can also occur at rest. It can suddenly increase in severity or frequency. It might not be relieved by sublingual nitroglycerin. It can last up to 30 minutes. The most common cause of unstable angina is a blood clot that has developed on the top of plaque buildup inside a coronary artery. It can lead to a heart attack if the blood clot completely blocks the artery.  Microvascular angina--This type of angina is caused by a disorder of tiny blood vessels called arterioles.  Microvascular angina is more common in women. The pain may be more severe and last longer than other types of angina pectoris.  Prinzmetal or variant angina--This type of angina pectoris usually occurs when your body experiences little or no physical exertion. It especially occurs in the early morning hours. It is caused by a spasm of your coronary artery. HOME CARE INSTRUCTIONS   Only take over-the-counter and prescription medicines as directed by your health care provider.  Stay active or increase your exercise as directed by your health care provider.  Limit strenuous activity as directed by your health care provider.  Limit heavy lifting as directed by your health care provider.  Maintain a healthy weight.  Learn about and eat heart-healthy foods.  Do not use any tobacco products including cigarettes, chewing tobacco or electronic cigarettes. SEEK IMMEDIATE MEDICAL CARE IF:  You experience the following symptoms:  Chest, neck, deep shoulder, or arm pain or discomfort that lasts more than a few minutes.  Chest, neck, deep shoulder, or arm pain or discomfort that goes away and comes back, repeatedly.  Heavy sweating with discomfort, without a noticeable cause.  Shortness of breath or difficulty breathing.  Angina that does not get better after a few minutes of rest or after taking sublingual nitroglycerin. These can all be symptoms of a heart attack, which is a medical emergency! Get medical help at once. Call your local emergency service (911 in [place] immediately. Do not  drive yourself to the hospital and do not  wait to for your symptoms to go away. MAKE SURE YOU:  Understand these instructions.  Will watch your condition.  Will get help right away if you are not doing well or get worse. Document Released: 12/08/2005 Document Revised: 12/13/2013 Document Reviewed: 04/11/2014 [facility] Patient Information 2015 [place], [place]. This information is not intended to replace  advice given to you by your health care provider. Make sure you discuss any questions you have with your health care provider.

## 2015-09-03 NOTE — ED Notes (Signed)
Removed nitropaste at 0150 due to low BP. Dr. Wilkie Aye aware and I was given verbal order to remove nitro paster.

## 2015-09-17 ENCOUNTER — Other Ambulatory Visit: Payer: Self-pay | Admitting: Cardiology

## 2015-09-26 ENCOUNTER — Emergency Department (HOSPITAL_COMMUNITY)
Admission: EM | Admit: 2015-09-26 | Discharge: 2015-09-26 | Disposition: A | Discharge status: Home / Self Care | Payer: Medicare Other | Attending: Emergency Medicine | Admitting: Emergency Medicine

## 2015-09-26 ENCOUNTER — Encounter (HOSPITAL_COMMUNITY): Payer: Self-pay | Admitting: Cardiology

## 2015-09-26 ENCOUNTER — Emergency Department (HOSPITAL_COMMUNITY): Payer: Medicare Other

## 2015-09-26 DIAGNOSIS — Z9119 Patient's noncompliance with other medical treatment and regimen: Secondary | ICD-10-CM | POA: Diagnosis not present

## 2015-09-26 DIAGNOSIS — E785 Hyperlipidemia, unspecified: Secondary | ICD-10-CM | POA: Diagnosis not present

## 2015-09-26 DIAGNOSIS — E875 Hyperkalemia: Secondary | ICD-10-CM | POA: Diagnosis not present

## 2015-09-26 DIAGNOSIS — Z720 Tobacco use: Secondary | ICD-10-CM | POA: Diagnosis not present

## 2015-09-26 DIAGNOSIS — I5022 Chronic systolic (congestive) heart failure: Secondary | ICD-10-CM | POA: Insufficient documentation

## 2015-09-26 DIAGNOSIS — Z79899 Other long term (current) drug therapy: Secondary | ICD-10-CM | POA: Diagnosis not present

## 2015-09-26 DIAGNOSIS — Z7902 Long term (current) use of antithrombotics/antiplatelets: Secondary | ICD-10-CM | POA: Diagnosis not present

## 2015-09-26 DIAGNOSIS — I252 Old myocardial infarction: Secondary | ICD-10-CM | POA: Diagnosis not present

## 2015-09-26 DIAGNOSIS — I4891 Unspecified atrial fibrillation: Secondary | ICD-10-CM | POA: Diagnosis not present

## 2015-09-26 DIAGNOSIS — R079 Chest pain, unspecified: Secondary | ICD-10-CM

## 2015-09-26 DIAGNOSIS — I2510 Atherosclerotic heart disease of native coronary artery without angina pectoris: Secondary | ICD-10-CM | POA: Insufficient documentation

## 2015-09-26 DIAGNOSIS — E119 Type 2 diabetes mellitus without complications: Secondary | ICD-10-CM | POA: Diagnosis not present

## 2015-09-26 DIAGNOSIS — Z7901 Long term (current) use of anticoagulants: Secondary | ICD-10-CM | POA: Diagnosis not present

## 2015-09-26 DIAGNOSIS — Z951 Presence of aortocoronary bypass graft: Secondary | ICD-10-CM | POA: Diagnosis not present

## 2015-09-26 DIAGNOSIS — Z794 Long term (current) use of insulin: Secondary | ICD-10-CM | POA: Diagnosis not present

## 2015-09-26 DIAGNOSIS — Z9889 Other specified postprocedural states: Secondary | ICD-10-CM | POA: Diagnosis not present

## 2015-09-26 DIAGNOSIS — Z8739 Personal history of other diseases of the musculoskeletal system and connective tissue: Secondary | ICD-10-CM | POA: Insufficient documentation

## 2015-09-26 LAB — BASIC METABOLIC PANEL
ANION GAP: 4 — AB (ref 5–15)
BUN: 26 mg/dL — ABNORMAL HIGH (ref 6–20)
CALCIUM: 8.6 mg/dL — AB (ref 8.9–10.3)
CO2: 28 mmol/L (ref 22–32)
CREATININE: 1.52 mg/dL — AB (ref 0.61–1.24)
Chloride: 103 mmol/L (ref 101–111)
GFR, EST AFRICAN AMERICAN: 53 mL/min — AB (ref >60)
GFR, EST NON AFRICAN AMERICAN: 45 mL/min — AB (ref >60)
Glucose, Bld: 141 mg/dL — ABNORMAL HIGH (ref 65–99)
Potassium: 5.6 mmol/L — ABNORMAL HIGH (ref 3.5–5.1)
Sodium: 135 mmol/L (ref 135–145)

## 2015-09-26 LAB — CBC WITH DIFFERENTIAL/PLATELET
BASOS ABS: 0.1 10*3/uL (ref 0.0–0.1)
BASOS PCT: 1 %
EOS ABS: 0.2 10*3/uL (ref 0.0–0.7)
EOS PCT: 2 %
HCT: 39.2 % (ref 39.0–52.0)
Hemoglobin: 13.2 g/dL (ref 13.0–17.0)
Lymphocytes Relative: 25 %
Lymphs Abs: 2.5 10*3/uL (ref 0.7–4.0)
MCH: 32 pg (ref 26.0–34.0)
MCHC: 33.7 g/dL (ref 30.0–36.0)
MCV: 94.9 fL (ref 78.0–100.0)
MONO ABS: 0.7 10*3/uL (ref 0.1–1.0)
MONOS PCT: 7 %
Neutro Abs: 6.7 10*3/uL (ref 1.7–7.7)
Neutrophils Relative %: 65 %
PLATELETS: 201 10*3/uL (ref 150–400)
RBC: 4.13 MIL/uL — ABNORMAL LOW (ref 4.22–5.81)
RDW: 13.3 % (ref 11.5–15.5)
WBC: 10.1 10*3/uL (ref 4.0–10.5)

## 2015-09-26 LAB — TROPONIN I: Troponin I: <0.03 ng/mL (ref <0.031)

## 2015-09-26 MED ORDER — NITROGLYCERIN 0.4 MG SL SUBL
0.4000 mg | SUBLINGUAL_TABLET | Freq: Once | SUBLINGUAL | Status: AC
  Administered 2015-09-26: 0.4 mg via SUBLINGUAL
  Filled 2015-09-26: qty 1

## 2015-09-26 MED ORDER — NITROGLYCERIN 0.4 MG SL SUBL
0.4000 mg | SUBLINGUAL_TABLET | Freq: Once | SUBLINGUAL | Status: AC
  Administered 2015-09-26: 0.4 mg via SUBLINGUAL

## 2015-09-26 NOTE — ED Notes (Signed)
Sudden onset in chest about an hour ago.  Pain worse with deep breath.  EKG normal with EMS.  101/51.  Heart rate 85.  CBG 182.  EMS gave 4 baby aspirin

## 2015-09-26 NOTE — ED Notes (Signed)
Pt rates pain as a 3-4 on pain scale, requesting something to eat,

## 2015-09-26 NOTE — ED Notes (Signed)
Lab at bedside for repeat troponin,

## 2015-09-26 NOTE — ED Notes (Signed)
Pt resting, no distress noted,  

## 2015-09-26 NOTE — Discharge Instructions (Signed)
Heart tests were normal times two.  Potassium was slightly elevated.  Follow-up your primary care doctor or cardiologist. Usually your nitroglycerin as needed

## 2015-09-26 NOTE — ED Notes (Signed)
Pt updated on plan of care, repositioned for comfort, continues to deny any pain,

## 2015-09-26 NOTE — ED Notes (Signed)
Dr Cook at bedside

## 2015-09-26 NOTE — ED Notes (Signed)
Pt updated on plan of care,  

## 2015-09-26 NOTE — ED Provider Notes (Signed)
CSN: [id number]     Arrival date & time 09/26/15  1802 History   First MD Initiated Contact with Patient 09/26/15 1813     Chief Complaint  Patient presents with  . Chest Pain     (Consider location/radiation/quality/duration/timing/severity/associated sxs/prior Treatment) HPI..... Anterior chest pain approximately 1 hour prior to visit without radiation. Negative diaphoresis, nausea, dyspnea. Patient took one nitroglycerin which helped some. He has an extensive heart history including CABG and stents in the past. Severity is mild to moderate.  Past Medical History  Diagnosis Date  . Coronary atherosclerosis of native coronary artery     Ant. MI in 4/95; PTCA of 90% prox & distal LAD unsuccessful-->  Urgent CABG-4/95 TO Cx; 50% RCA; ant. HK; EF of 50-55%; 08/2008: 3-V disease plus a  95% LIMA stenosis, treated with DES; occlusion of SVG to CX; RCA SVG stenosis--> PCI with DES  2010: in-stent restenosis in the LIMA-->cutting balloon; EF of 35-40%;  07/2010: TO of SVG to RCA-medical therapy advised  . Atrial fibrillation (HCC)     1995, 2009; bradycardia with beta blocker therapy  . Peripheral vascular disease (HCC) 1995    Abdominal aortic obstruction 1995-->vascular surgery  . Hyperlipidemia   . Type 2 diabetes mellitus (HCC)   . DDD (degenerative disc disease)     Discectomy and fusion at L4 and L5  . History of alcohol abuse     Quit in 1999  . Chronic systolic heart failure (HCC)   . Non-compliance   . Ischemic cardiomyopathy     LVEF 40-45% August 2016   Past Surgical History  Procedure Laterality Date  . Cholecystectomy  1995  . Lumbar fusion      +Discectomy;x2; L4 and L5  . Aorto-femoral bypass graft  1995  . Left heart catheterization with coronary angiogram N/A 12/14/2013    Procedure: LEFT HEART CATHETERIZATION WITH CORONARY ANGIOGRAM;  Surgeon: Micheline Chapman, MD;  Location: [facility] CATH LAB;  Service: Cardiovascular;  Laterality: N/A;  . Back surgery     Family History   Problem Relation Age of Onset  . CAD Father    Social History  Substance Use Topics  . Smoking status: Current Every Day Smoker -- 0.50 packs/day for 40 years    Types: Cigarettes  . Smokeless tobacco: Current User     Comment: refused  . Alcohol Use: No     Comment: former    Review of Systems  All other systems reviewed and are negative.     Allergies  Review of patient's allergies indicates no known allergies.  Home Medications   Prior to Admission medications   Medication Sig Start Date End Date Taking? Authorizing Provider  albuterol (PROVENTIL HFA;VENTOLIN HFA) 108 (90 BASE) MCG/ACT inhaler Inhale 2 puffs into the lungs every 6 (six) hours as needed for wheezing or shortness of breath.   Yes Historical Provider, MD  carvedilol (COREG) 3.125 MG tablet Take 1 tablet (3.125 mg total) by mouth 2 (two) times daily with a meal. 08/10/15  Yes Brittainy Sherlynn Carbon, PA-C  clopidogrel (PLAVIX) 75 MG tablet Take 1 tablet (75 mg total) by mouth daily. 08/10/15  Yes Brittainy Sherlynn Carbon, PA-C  dabigatran (PRADAXA) 150 MG CAPS capsule Take 150 mg by mouth 2 (two) times daily.   Yes Historical Provider, MD  digoxin (LANOXIN) 0.125 MG tablet Take 125 mcg by mouth daily.     Yes Historical Provider, MD  furosemide (LASIX) 40 MG tablet Take 1 tablet (40 mg total) by  mouth every other day. 11/21/14  Yes Antoine Poche, MD  insulin aspart (NOVOLOG FLEXPEN) 100 UNIT/ML FlexPen Inject 20 Units into the skin 3 (three) times daily with meals.   Yes Historical Provider, MD  insulin detemir (LEVEMIR) 100 UNIT/ML injection Inject 40 Units into the skin daily.    Yes Historical Provider, MD  isosorbide mononitrate (IMDUR) 30 MG 24 hr tablet Take 1 tablet (30 mg total) by mouth daily. 09/08/13  Yes Angus [name], MD  lisinopril (PRINIVIL,ZESTRIL) 5 MG tablet Take 1 tablet (5 mg total) by mouth daily. 02/28/14  Yes Antoine Poche, MD  nitroGLYCERIN (NITROSTAT) 0.4 MG SL tablet Place 1 tablet (0.4 mg  total) under the tongue every 5 (five) minutes as needed. Patient taking differently: Place 0.4 mg under the tongue every 5 (five) minutes as needed for chest pain.  08/10/15  Yes Brittainy Sherlynn Carbon, PA-C  potassium chloride SA (K-DUR,KLOR-CON) 20 MEQ tablet TAKE ONE TABLET TWICE DAILY 08/08/15  Yes Antoine Poche, MD  ranolazine (RANEXA) 1000 MG SR tablet Take 1 tablet (1,000 mg total) by mouth 2 (two) times daily. 08/10/15  Yes Brittainy Sherlynn Carbon, PA-C  rosuvastatin (CRESTOR) 40 MG tablet Take 40 mg by mouth daily.   Yes Historical Provider, MD  traMADol (ULTRAM) 50 MG tablet Take 1 tablet (50 mg total) by mouth every 6 (six) hours as needed. Patient not taking: Reported on 09/26/2015 08/29/15   Mancel Bale, MD   BP 97/73 mmHg  Pulse 60  Temp(Src) 97.6 F (36.4 C) (Oral)  Resp 22  Ht  (1.753 m)  Wt 170 lb (77.111 kg)  BMI 25.09 kg/m2  SpO2 97% Physical Exam  Constitutional: He is oriented to person, place, and time. He appears well-developed and well-nourished.  HENT:  Head: Normocephalic and atraumatic.  Eyes: Conjunctivae and EOM are normal. Pupils are equal, round, and reactive to light.  Neck: Normal range of motion. Neck supple.  Cardiovascular: Normal rate and regular rhythm.   Pulmonary/Chest: Effort normal and breath sounds normal.  Abdominal: Soft. Bowel sounds are normal.  Musculoskeletal: Normal range of motion.  Neurological: He is alert and oriented to person, place, and time.  Skin: Skin is warm and dry.  Psychiatric: He has a normal mood and affect. His behavior is normal.  Nursing note and vitals reviewed.   ED Course  Procedures (including critical care time) Labs Review Labs Reviewed  CBC WITH DIFFERENTIAL/PLATELET - Abnormal; Notable for the following:    RBC 4.13 (*)    All other components within normal limits  BASIC METABOLIC PANEL - Abnormal; Notable for the following:    Potassium 5.6 (*)    Glucose, Bld 141 (*)    BUN 26 (*)    Creatinine,  Ser 1.52 (*)    Calcium 8.6 (*)    GFR calc non Af Amer 45 (*)    GFR calc Af Amer 53 (*)    Anion gap 4 (*)    All other components within normal limits  TROPONIN I  TROPONIN I    Imaging Review Dg Chest Port 1 View  09/26/2015   CLINICAL DATA:  69 year old male with central chest pain and mild shortness of breath for the past 2 hours  EXAM: PORTABLE CHEST 1 VIEW  COMPARISON:  Prior chest x-ray 09/02/2015  FINDINGS: Stable cardiomegaly. Pulmonary vascular congestion slightly increased compared to prior. No overt edema. Patient is status post median sternotomy with evidence of prior multivessel CABG including LIMA bypass. No  focal consolidation, pleural effusion or pneumothorax. No acute osseous abnormality.  IMPRESSION: 1. Stable cardiomegaly with slightly increased pulmonary vascular congestion but no overt pulmonary edema.   Electronically Signed   By: Malachy Moan M.D.   On: 09/26/2015 19:15   I have personally reviewed and evaluated these images and lab results as part of my medical decision-making.   EKG Interpretation   Date/Time:  Wednesday September 26 2015 18:11:23 EDT Ventricular Rate:  67 PR Interval:    QRS Duration: 90 QT Interval:  442 QTC Calculation: 467 R Axis:   -90 Text Interpretation:  Atrial fibrillation Inferior infarct, old Abnormal  lateral Q waves Anterior infarct, old Confirmed by Carvin Almas  MD, Adlene Adduci ([id number])  on 09/26/2015 6:54:53 PM Also confirmed by Adriana Simas  MD, Treyton Slimp ([id number])  on  09/26/2015 7:33:43 PM      MDM   Final diagnoses:  Chest pain, unspecified chest pain type  Hyperkalemia    Patient appears in no acute distress. Atrial fibrillation is not new. First troponin negative. Discussed with patient and Dr. Rubin Payor.  Most likely discharge if second troponin negative.    Donnetta Hutching, MD 09/26/15 [phone]

## 2015-10-18 ENCOUNTER — Other Ambulatory Visit: Payer: Self-pay | Admitting: Cardiology

## 2015-10-18 NOTE — Telephone Encounter (Signed)
Should this still be 3.125mg  bid? Please advise. Thanks, MI

## 2015-10-23 NOTE — Telephone Encounter (Signed)
1)  Home # has fast busy signal.    2)  Contact person Oncologist(Patsy Dolinsky) - Left message to return call.

## 2015-11-21 ENCOUNTER — Ambulatory Visit (INDEPENDENT_AMBULATORY_CARE_PROVIDER_SITE_OTHER): Payer: Medicare Other | Admitting: Cardiology

## 2015-11-21 ENCOUNTER — Other Ambulatory Visit: Payer: Self-pay | Admitting: Cardiology

## 2015-11-21 ENCOUNTER — Encounter: Payer: Self-pay | Admitting: Cardiology

## 2015-11-21 VITALS — BP 108/60 | HR 71 | Ht 69.0 in | Wt 161.0 lb

## 2015-11-21 DIAGNOSIS — I2510 Atherosclerotic heart disease of native coronary artery without angina pectoris: Secondary | ICD-10-CM | POA: Diagnosis not present

## 2015-11-21 DIAGNOSIS — I5022 Chronic systolic (congestive) heart failure: Secondary | ICD-10-CM | POA: Diagnosis not present

## 2015-11-21 DIAGNOSIS — I4891 Unspecified atrial fibrillation: Secondary | ICD-10-CM

## 2015-11-21 DIAGNOSIS — I200 Unstable angina: Secondary | ICD-10-CM | POA: Diagnosis not present

## 2015-11-21 NOTE — Patient Instructions (Signed)
Medication Instructions:  Your physician recommends that you continue on your current medications as directed. Please refer to the Current Medication list given to you today.   Labwork:[facility]  Testing/Procedures: none  Follow-Up: Your physician recommends that you schedule a follow-up appointment in: 3 months with Joni ReiningKathryn Lawrence ,NP  Any Other Special Instructions Will Be Listed Below (If Applicable).     If you need a refill on your cardiac medications before your next appointment, please call your pharmacy.

## 2015-11-21 NOTE — Progress Notes (Signed)
Patient ID: PHILBERT OCALLAGHAN, male   DOB: Sep 07, 1946, 69 y.o.   MRN: [id number]     Clinical Summary Mr. Brander is a 69 y.o.male seen today for follow up of the following medical problems.   1. Chronic systolic HF - LVEF 35-40% by echo 04/2014 - echo 07/2015 LVEF 40-45%  - denies any SOB or DOE, no LE edema  2. CAD - last cath 11/2013 with severe 3 vessel CAD, SVG x2 chronically occluded, and patent LIMA-LAD. Overall stable unchanged coronary anatomy. Previous caths describe anatomy not amenable to revascularization.  - intermittent chest pain at times that is overall stable and responds to NG   3. Afib - denies any palpitations - compliant with meds, he is on pradaxa for stroke prevention  4. Hyperlipidemia - compliant with statin   Past Medical History  Diagnosis Date  . Coronary atherosclerosis of native coronary artery     Ant. MI in 4/95; PTCA of 90% prox & distal LAD unsuccessful-->  Urgent CABG-4/95 TO Cx; 50% RCA; ant. HK; EF of 50-55%; 08/2008: 3-V disease plus a  95% LIMA stenosis, treated with DES; occlusion of SVG to CX; RCA SVG stenosis--> PCI with DES  2010: in-stent restenosis in the LIMA-->cutting balloon; EF of 35-40%;  07/2010: TO of SVG to RCA-medical therapy advised  . Atrial fibrillation (HCC)     1995, 2009; bradycardia with beta blocker therapy  . Peripheral vascular disease (HCC) 1995    Abdominal aortic obstruction 1995-->vascular surgery  . Hyperlipidemia   . Type 2 diabetes mellitus (HCC)   . DDD (degenerative disc disease)     Discectomy and fusion at L4 and L5  . History of alcohol abuse     Quit in 1999  . Chronic systolic heart failure (HCC)   . Non-compliance   . Ischemic cardiomyopathy     LVEF 40-45% August 2016     No Known Allergies   Current Outpatient Prescriptions  Medication Sig Dispense Refill  . albuterol (PROVENTIL HFA;VENTOLIN HFA) 108 (90 BASE) MCG/ACT inhaler Inhale 2 puffs into the lungs every 6 (six) hours as needed for  wheezing or shortness of breath.    . carvedilol (COREG) 3.125 MG tablet Take 1 tablet (3.125 mg total) by mouth 2 (two) times daily with a meal. 60 tablet 5  . carvedilol (COREG) 3.125 MG tablet Take 1 tablet (3.125 mg total) by mouth 2 (two) times daily. 60 tablet 1  . clopidogrel (PLAVIX) 75 MG tablet Take 1 tablet (75 mg total) by mouth daily. 30 tablet 5  . dabigatran (PRADAXA) 150 MG CAPS capsule Take 150 mg by mouth 2 (two) times daily.    . digoxin (LANOXIN) 0.125 MG tablet Take 125 mcg by mouth daily.      . furosemide (LASIX) 40 MG tablet Take 1 tablet (40 mg total) by mouth every other day. 30 tablet 6  . insulin aspart (NOVOLOG FLEXPEN) 100 UNIT/ML FlexPen Inject 20 Units into the skin 3 (three) times daily with meals.    . insulin detemir (LEVEMIR) 100 UNIT/ML injection Inject 40 Units into the skin daily.     . isosorbide mononitrate (IMDUR) 30 MG 24 hr tablet Take 1 tablet (30 mg total) by mouth daily. 30 tablet 5  . lisinopril (PRINIVIL,ZESTRIL) 5 MG tablet Take 1 tablet (5 mg total) by mouth daily. 90 tablet 3  . nitroGLYCERIN (NITROSTAT) 0.4 MG SL tablet Place 1 tablet (0.4 mg total) under the tongue every 5 (five) minutes as needed. (Patient  taking differently: Place 0.4 mg under the tongue every 5 (five) minutes as needed for chest pain. ) 25 tablet 2  . potassium chloride SA (K-DUR,KLOR-CON) 20 MEQ tablet Take 1 tablet (20 mEq total) by mouth 2 (two) times daily. 60 tablet 0  . ranolazine (RANEXA) 1000 MG SR tablet Take 1 tablet (1,000 mg total) by mouth 2 (two) times daily. 60 tablet 5  . rosuvastatin (CRESTOR) 40 MG tablet Take 40 mg by mouth daily.    . traMADol (ULTRAM) 50 MG tablet Take 1 tablet (50 mg total) by mouth every 6 (six) hours as needed. (Patient not taking: Reported on 09/26/2015) 15 tablet 0   No current facility-administered medications for this visit.     Past Surgical History  Procedure Laterality Date  . Cholecystectomy  1995  . Lumbar fusion       +Discectomy;x2; L4 and L5  . Aorto-femoral bypass graft  1995  . Left heart catheterization with coronary angiogram N/A 12/14/2013    Procedure: LEFT HEART CATHETERIZATION WITH CORONARY ANGIOGRAM;  Surgeon: Micheline Chapman, MD;  Location: [facility] CATH LAB;  Service: Cardiovascular;  Laterality: N/A;  . Back surgery       No Known Allergies    Family History  Problem Relation Age of Onset  . CAD Father      Social History Mr. Gautier reports that he has been smoking Cigarettes.  He has a 20 pack-year smoking history. He uses smokeless tobacco. Mr. Pasquarello reports that he does not drink alcohol.   Review of Systems CONSTITUTIONAL: No weight loss, fever, chills, weakness or fatigue.  HEENT: Eyes: No visual loss, blurred vision, double vision or yellow sclerae.No hearing loss, sneezing, congestion, runny nose or sore throat.  SKIN: No rash or itching.  CARDIOVASCULAR: perHPI RESPIRATORY: No shortness of breath, cough or sputum.  GASTROINTESTINAL: No anorexia, nausea, vomiting or diarrhea. No abdominal pain or blood.  GENITOURINARY: No burning on urination, no polyuria NEUROLOGICAL: No headache, dizziness, syncope, paralysis, ataxia, numbness or tingling in the extremities. No change in bowel or bladder control.  MUSCULOSKELETAL: No muscle, back pain, joint pain or stiffness.  LYMPHATICS: No enlarged nodes. No history of splenectomy.  PSYCHIATRIC: No history of depression or anxiety.  ENDOCRINOLOGIC: No reports of sweating, cold or heat intolerance. No polyuria or polydipsia.  Marland Kitchen   Physical Examination Filed Vitals:   11/21/15 1525  BP: 108/60  Pulse: 71   Filed Vitals:   11/21/15 1525  Height:  (1.753 m)  Weight: 161 lb (73.029 kg)    Gen: resting comfortably, no acute distress HEENT: no scleral icterus, pupils equal round and reactive, no palptable cervical adenopathy,  CV: RRR, no m/r/g, no jvd Resp: Clear to auscultation bilaterally GI: abdomen is soft, non-tender,  non-distended, normal bowel sounds, no hepatosplenomegaly MSK: extremities are warm, no edema.  Skin: warm, no rash Neuro:  no focal deficits Psych: appropriate affect   Diagnostic Studies 04/2014 echo Study Conclusions  - Study data: Technically difficult study. - Left ventricle: The cavity size was normal. Wall thickness was increased increased in a pattern of mild to moderate LVH. Systolic function was moderately to severely reduced. The estimated ejection fraction was in the range of 35% to 40%. Compared to prior study 09/07/13 LVEF has improved. - Aortic valve: Mildly calcified annulus. Trileaflet; mildly thickened leaflets. Valve area: 1.65cm^2(VTI). Valve area: 1.76cm^2 (Vmax). - Mitral valve: Mildly calcified annulus. Mildly thickened leaflets . Mild regurgitation. - Left atrium: The atrium was severely dilated. -  Right ventricle: The cavity size was mildly dilated. - Right atrium: The atrium was severely dilated.    Assessment and Plan   1. Chronic systolic HF -appears euvolemic, continue current meds  2. CAD - significant CAD from last cath with no targets for revasc, stable intermittent chest pain - continue current meds  3. Afib - no current symptoms, continue current meds  4. Hyperlipidemia - continue statin   Antoine PocheJonathan F. Branch, M.D.

## 2015-12-12 ENCOUNTER — Inpatient Hospital Stay (HOSPITAL_COMMUNITY)
Admission: EM | Admit: 2015-12-12 | Discharge: 2015-12-15 | DRG: 638 | Disposition: A | Discharge status: Home / Self Care | Payer: Medicare Other | Attending: Family Medicine | Admitting: Family Medicine

## 2015-12-12 ENCOUNTER — Emergency Department (HOSPITAL_COMMUNITY): Payer: Medicare Other

## 2015-12-12 ENCOUNTER — Encounter (HOSPITAL_COMMUNITY): Payer: Self-pay | Admitting: Emergency Medicine

## 2015-12-12 DIAGNOSIS — I2510 Atherosclerotic heart disease of native coronary artery without angina pectoris: Secondary | ICD-10-CM | POA: Diagnosis present

## 2015-12-12 DIAGNOSIS — T68XXXA Hypothermia, initial encounter: Secondary | ICD-10-CM | POA: Diagnosis present

## 2015-12-12 DIAGNOSIS — J449 Chronic obstructive pulmonary disease, unspecified: Secondary | ICD-10-CM | POA: Diagnosis present

## 2015-12-12 DIAGNOSIS — I959 Hypotension, unspecified: Secondary | ICD-10-CM | POA: Diagnosis present

## 2015-12-12 DIAGNOSIS — E0810 Diabetes mellitus due to underlying condition with ketoacidosis without coma: Secondary | ICD-10-CM

## 2015-12-12 DIAGNOSIS — I5042 Chronic combined systolic (congestive) and diastolic (congestive) heart failure: Secondary | ICD-10-CM | POA: Diagnosis present

## 2015-12-12 DIAGNOSIS — E11649 Type 2 diabetes mellitus with hypoglycemia without coma: Secondary | ICD-10-CM | POA: Diagnosis not present

## 2015-12-12 DIAGNOSIS — Z8249 Family history of ischemic heart disease and other diseases of the circulatory system: Secondary | ICD-10-CM

## 2015-12-12 DIAGNOSIS — Z9049 Acquired absence of other specified parts of digestive tract: Secondary | ICD-10-CM

## 2015-12-12 DIAGNOSIS — I952 Hypotension due to drugs: Secondary | ICD-10-CM

## 2015-12-12 DIAGNOSIS — I4891 Unspecified atrial fibrillation: Secondary | ICD-10-CM | POA: Diagnosis present

## 2015-12-12 DIAGNOSIS — E785 Hyperlipidemia, unspecified: Secondary | ICD-10-CM | POA: Diagnosis present

## 2015-12-12 DIAGNOSIS — F17200 Nicotine dependence, unspecified, uncomplicated: Secondary | ICD-10-CM

## 2015-12-12 DIAGNOSIS — Z9119 Patient's noncompliance with other medical treatment and regimen: Secondary | ICD-10-CM

## 2015-12-12 DIAGNOSIS — Z794 Long term (current) use of insulin: Secondary | ICD-10-CM

## 2015-12-12 DIAGNOSIS — I482 Chronic atrial fibrillation: Secondary | ICD-10-CM | POA: Diagnosis present

## 2015-12-12 DIAGNOSIS — F17210 Nicotine dependence, cigarettes, uncomplicated: Secondary | ICD-10-CM | POA: Diagnosis present

## 2015-12-12 DIAGNOSIS — R6510 Systemic inflammatory response syndrome (SIRS) of non-infectious origin without acute organ dysfunction: Secondary | ICD-10-CM | POA: Diagnosis not present

## 2015-12-12 DIAGNOSIS — N179 Acute kidney failure, unspecified: Secondary | ICD-10-CM | POA: Diagnosis present

## 2015-12-12 DIAGNOSIS — Z7901 Long term (current) use of anticoagulants: Secondary | ICD-10-CM

## 2015-12-12 DIAGNOSIS — E162 Hypoglycemia, unspecified: Secondary | ICD-10-CM | POA: Diagnosis present

## 2015-12-12 DIAGNOSIS — I110 Hypertensive heart disease with heart failure: Secondary | ICD-10-CM | POA: Diagnosis present

## 2015-12-12 DIAGNOSIS — Z951 Presence of aortocoronary bypass graft: Secondary | ICD-10-CM | POA: Diagnosis not present

## 2015-12-12 DIAGNOSIS — E1151 Type 2 diabetes mellitus with diabetic peripheral angiopathy without gangrene: Secondary | ICD-10-CM | POA: Diagnosis present

## 2015-12-12 DIAGNOSIS — E119 Type 2 diabetes mellitus without complications: Secondary | ICD-10-CM

## 2015-12-12 DIAGNOSIS — I9589 Other hypotension: Secondary | ICD-10-CM

## 2015-12-12 DIAGNOSIS — Z91199 Patient's noncompliance with other medical treatment and regimen due to unspecified reason: Secondary | ICD-10-CM

## 2015-12-12 DIAGNOSIS — I480 Paroxysmal atrial fibrillation: Secondary | ICD-10-CM

## 2015-12-12 DIAGNOSIS — E1169 Type 2 diabetes mellitus with other specified complication: Secondary | ICD-10-CM

## 2015-12-12 DIAGNOSIS — I5041 Acute combined systolic (congestive) and diastolic (congestive) heart failure: Secondary | ICD-10-CM

## 2015-12-12 DIAGNOSIS — I739 Peripheral vascular disease, unspecified: Secondary | ICD-10-CM | POA: Diagnosis present

## 2015-12-12 LAB — PROTIME-INR
INR: 2.11 — AB (ref 0.00–1.49)
Prothrombin Time: 23.5 seconds — ABNORMAL HIGH (ref 11.6–15.2)

## 2015-12-12 LAB — RAPID URINE DRUG SCREEN, HOSP PERFORMED
Amphetamines: NOT DETECTED
BARBITURATES: NOT DETECTED
Benzodiazepines: NOT DETECTED
COCAINE: NOT DETECTED
Opiates: NOT DETECTED
Tetrahydrocannabinol: NOT DETECTED

## 2015-12-12 LAB — COMPREHENSIVE METABOLIC PANEL
ALBUMIN: 3.9 g/dL (ref 3.5–5.0)
ALK PHOS: 69 U/L (ref 38–126)
ALT: 22 U/L (ref 17–63)
ANION GAP: 6 (ref 5–15)
AST: 46 U/L — AB (ref 15–41)
BILIRUBIN TOTAL: 0.6 mg/dL (ref 0.3–1.2)
BUN: 32 mg/dL — AB (ref 6–20)
CO2: 22 mmol/L (ref 22–32)
Calcium: 8.6 mg/dL — ABNORMAL LOW (ref 8.9–10.3)
Chloride: 106 mmol/L (ref 101–111)
Creatinine, Ser: 1.3 mg/dL — ABNORMAL HIGH (ref 0.61–1.24)
GFR calc Af Amer: >60 mL/min (ref >60)
GFR calc non Af Amer: 55 mL/min — ABNORMAL LOW (ref >60)
GLUCOSE: 50 mg/dL — AB (ref 65–99)
POTASSIUM: 4.8 mmol/L (ref 3.5–5.1)
SODIUM: 134 mmol/L — AB (ref 135–145)
TOTAL PROTEIN: 7.2 g/dL (ref 6.5–8.1)

## 2015-12-12 LAB — CBC WITH DIFFERENTIAL/PLATELET
BASOS ABS: 0 10*3/uL (ref 0.0–0.1)
BASOS PCT: 0 %
EOS ABS: 0.1 10*3/uL (ref 0.0–0.7)
EOS PCT: 0 %
HEMATOCRIT: 43.7 % (ref 39.0–52.0)
Hemoglobin: 14.7 g/dL (ref 13.0–17.0)
Lymphocytes Relative: 9 %
Lymphs Abs: 1.3 10*3/uL (ref 0.7–4.0)
MCH: 32.2 pg (ref 26.0–34.0)
MCHC: 33.6 g/dL (ref 30.0–36.0)
MCV: 95.6 fL (ref 78.0–100.0)
MONO ABS: 0.6 10*3/uL (ref 0.1–1.0)
MONOS PCT: 4 %
Neutro Abs: 11.7 10*3/uL — ABNORMAL HIGH (ref 1.7–7.7)
Neutrophils Relative %: 87 %
PLATELETS: 227 10*3/uL (ref 150–400)
RBC: 4.57 MIL/uL (ref 4.22–5.81)
RDW: 13.7 % (ref 11.5–15.5)
WBC: 13.5 10*3/uL — ABNORMAL HIGH (ref 4.0–10.5)

## 2015-12-12 LAB — URINE MICROSCOPIC-ADD ON

## 2015-12-12 LAB — CBG MONITORING, ED
GLUCOSE-CAPILLARY: 83 mg/dL (ref 65–99)
GLUCOSE-CAPILLARY: 125 mg/dL — AB (ref 65–99)
GLUCOSE-CAPILLARY: 42 mg/dL — AB (ref 65–99)
GLUCOSE-CAPILLARY: 128 mg/dL — AB (ref 65–99)

## 2015-12-12 LAB — TROPONIN I

## 2015-12-12 LAB — URINALYSIS, ROUTINE W REFLEX MICROSCOPIC
Bilirubin Urine: NEGATIVE
GLUCOSE, UA: 100 mg/dL — AB
Hgb urine dipstick: NEGATIVE
Ketones, ur: NEGATIVE mg/dL
LEUKOCYTES UA: NEGATIVE
NITRITE: NEGATIVE
PH: 5 (ref 5.0–8.0)
Specific Gravity, Urine: >1.03 — ABNORMAL HIGH (ref 1.005–1.030)

## 2015-12-12 LAB — DIGOXIN LEVEL: DIGOXIN LVL: 0.8 ng/mL (ref 0.8–2.0)

## 2015-12-12 LAB — I-STAT CG4 LACTIC ACID, ED: LACTIC ACID, VENOUS: 1.31 mmol/L (ref 0.5–2.0)

## 2015-12-12 LAB — TSH: TSH: 1.021 u[IU]/mL (ref 0.350–4.500)

## 2015-12-12 LAB — APTT: APTT: 70 s — AB (ref 24–37)

## 2015-12-12 LAB — ETHANOL: Alcohol, Ethyl (B): <5 mg/dL (ref <5)

## 2015-12-12 LAB — LACTIC ACID, PLASMA: LACTIC ACID, VENOUS: 1.3 mmol/L (ref 0.5–2.0)

## 2015-12-12 LAB — PROCALCITONIN

## 2015-12-12 MED ORDER — DEXTROSE-NACL 5-0.45 % IV SOLN
INTRAVENOUS | Status: DC
  Administered 2015-12-12: 19:00:00 via INTRAVENOUS
  Administered 2015-12-13: 1 mL via INTRAVENOUS
  Administered 2015-12-13 – 2015-12-14 (×2): via INTRAVENOUS
  Administered 2015-12-14: 1 mL via INTRAVENOUS

## 2015-12-12 MED ORDER — DEXTROSE 50 % IV SOLN
50.0000 mL | Freq: Once | INTRAVENOUS | Status: AC
  Administered 2015-12-12: 50 mL via INTRAVENOUS

## 2015-12-12 MED ORDER — SODIUM CHLORIDE 0.9 % IV BOLUS (SEPSIS)
500.0000 mL | INTRAVENOUS | Status: AC
  Administered 2015-12-12: 500 mL via INTRAVENOUS

## 2015-12-12 MED ORDER — PIPERACILLIN-TAZOBACTAM 3.375 G IVPB 30 MIN
3.3750 g | Freq: Once | INTRAVENOUS | Status: AC
  Administered 2015-12-12: 3.375 g via INTRAVENOUS
  Filled 2015-12-12: qty 50

## 2015-12-12 MED ORDER — VANCOMYCIN HCL IN DEXTROSE 1-5 GM/200ML-% IV SOLN
1000.0000 mg | Freq: Once | INTRAVENOUS | Status: AC
  Administered 2015-12-12: 1000 mg via INTRAVENOUS
  Filled 2015-12-12: qty 200

## 2015-12-12 MED ORDER — NICOTINE 14 MG/24HR TD PT24
14.0000 mg | MEDICATED_PATCH | Freq: Every day | TRANSDERMAL | Status: DC
  Administered 2015-12-13 – 2015-12-15 (×3): 14 mg via TRANSDERMAL
  Filled 2015-12-12 (×3): qty 1

## 2015-12-12 MED ORDER — SODIUM CHLORIDE 0.9 % IV BOLUS (SEPSIS)
1000.0000 mL | INTRAVENOUS | Status: AC
  Administered 2015-12-12 – 2015-12-13 (×2): 1000 mL via INTRAVENOUS

## 2015-12-12 MED ORDER — DEXTROSE 50 % IV SOLN
INTRAVENOUS | Status: AC
  Administered 2015-12-12: 50 mL via INTRAVENOUS
  Filled 2015-12-12: qty 50

## 2015-12-12 NOTE — ED Notes (Signed)
Bair hugger removed since patient's temperature was up and the patient was kicking it off of him.

## 2015-12-12 NOTE — ED Notes (Signed)
Wife's telephone [phone]

## 2015-12-12 NOTE — ED Provider Notes (Signed)
CSN: [id number]     Arrival date & time 12/12/15  1749 History   First MD Initiated Contact with Patient 12/12/15 1759     Chief Complaint  Patient presents with  . Loss of Consciousness     Low 5 caveat due to confusion The history is provided by the patient and the EMS personnel.   patient brought in for unresponsiveness. Initial CBG was reportedly 60 but then found to be 37. D50 by EMS. Patient is still somewhat confused but much more awake. States that he did not eat today. States he is not sure what else happened. Not complaining of pain. He smells of urine and appears as if his diaper is not been change in a while. Patient cannot totally what happened. Denies chest pain. Denies fever.  Past Medical History  Diagnosis Date  . Coronary atherosclerosis of native coronary artery     Ant. MI in 4/95; PTCA of 90% prox & distal LAD unsuccessful-->  Urgent CABG-4/95 TO Cx; 50% RCA; ant. HK; EF of 50-55%; 08/2008: 3-V disease plus a  95% LIMA stenosis, treated with DES; occlusion of SVG to CX; RCA SVG stenosis--> PCI with DES  2010: in-stent restenosis in the LIMA-->cutting balloon; EF of 35-40%;  07/2010: TO of SVG to RCA-medical therapy advised  . Atrial fibrillation (HCC)     1995, 2009; bradycardia with beta blocker therapy  . Peripheral vascular disease (HCC) 1995    Abdominal aortic obstruction 1995-->vascular surgery  . Hyperlipidemia   . Type 2 diabetes mellitus (HCC)   . DDD (degenerative disc disease)     Discectomy and fusion at L4 and L5  . History of alcohol abuse     Quit in 1999  . Chronic systolic heart failure (HCC)   . Non-compliance   . Ischemic cardiomyopathy     LVEF 40-45% August 2016   Past Surgical History  Procedure Laterality Date  . Cholecystectomy  1995  . Lumbar fusion      +Discectomy;x2; L4 and L5  . Aorto-femoral bypass graft  1995  . Left heart catheterization with coronary angiogram N/A 12/14/2013    Procedure: LEFT HEART CATHETERIZATION WITH  CORONARY ANGIOGRAM;  Surgeon: Micheline Chapman, MD;  Location: [facility] CATH LAB;  Service: Cardiovascular;  Laterality: N/A;  . Back surgery     Family History  Problem Relation Age of Onset  . CAD Father    Social History  Substance Use Topics  . Smoking status: Current Every Day Smoker -- 0.50 packs/day for 40 years    Types: Cigarettes  . Smokeless tobacco: Current User     Comment: refused  . Alcohol Use: No     Comment: former    Review of Systems  Unable to perform ROS: Mental status change  Respiratory: Negative for chest tightness.   Musculoskeletal: Negative for back pain.      Allergies  Review of patient's allergies indicates no known allergies.  Home Medications   Prior to Admission medications   Medication Sig Start Date End Date Taking? Authorizing Provider  albuterol (PROVENTIL HFA;VENTOLIN HFA) 108 (90 BASE) MCG/ACT inhaler Inhale 2 puffs into the lungs every 6 (six) hours as needed for wheezing or shortness of breath.    Historical Provider, MD  carvedilol (COREG) 3.125 MG tablet Take 1 tablet (3.125 mg total) by mouth 2 (two) times daily with a meal. 08/10/15   Brittainy Sherlynn Carbon, PA-C  carvedilol (COREG) 3.125 MG tablet Take 1 tablet (3.125 mg total) by mouth  2 (two) times daily. 11/06/15   Antoine Poche, MD  clopidogrel (PLAVIX) 75 MG tablet Take 1 tablet (75 mg total) by mouth daily. 08/10/15   Brittainy Sherlynn Carbon, PA-C  dabigatran (PRADAXA) 150 MG CAPS capsule Take 150 mg by mouth 2 (two) times daily.    Historical Provider, MD  digoxin (LANOXIN) 0.125 MG tablet Take 125 mcg by mouth daily.      Historical Provider, MD  furosemide (LASIX) 40 MG tablet Take 1 tablet (40 mg total) by mouth every other day. 11/21/14   Antoine Poche, MD  insulin aspart (NOVOLOG FLEXPEN) 100 UNIT/ML FlexPen Inject 20 Units into the skin 3 (three) times daily with meals.    Historical Provider, MD  insulin detemir (LEVEMIR) 100 UNIT/ML injection Inject 40 Units into the skin  daily.     Historical Provider, MD  isosorbide mononitrate (IMDUR) 30 MG 24 hr tablet Take 1 tablet (30 mg total) by mouth daily. 09/08/13   Butch Penny, MD  lisinopril (PRINIVIL,ZESTRIL) 5 MG tablet Take 1 tablet (5 mg total) by mouth daily. 02/28/14   Antoine Poche, MD  nitroGLYCERIN (NITROSTAT) 0.4 MG SL tablet Place 1 tablet (0.4 mg total) under the tongue every 5 (five) minutes as needed. Patient taking differently: Place 0.4 mg under the tongue every 5 (five) minutes as needed for chest pain.  08/10/15   Brittainy Sherlynn Carbon, PA-C  potassium chloride SA (K-DUR,KLOR-CON) 20 MEQ tablet TAKE ONE TABLET BY MOUTH TWICE A DAY 11/21/15   Antoine Poche, MD  ranolazine (RANEXA) 1000 MG SR tablet Take 1 tablet (1,000 mg total) by mouth 2 (two) times daily. 08/10/15   Brittainy Sherlynn Carbon, PA-C  rosuvastatin (CRESTOR) 40 MG tablet Take 40 mg by mouth daily.    Historical Provider, MD  traMADol (ULTRAM) 50 MG tablet Take 1 tablet (50 mg total) by mouth every 6 (six) hours as needed. 08/29/15   Mancel Bale, MD   BP 111/60 mmHg  Pulse 67  Temp(Src) 94.1 F (34.5 C) (Oral)  Resp 16  Ht 5\' 9"  (1.753 m)  Wt 161 lb (73.029 kg)  BMI 23.76 kg/m2  SpO2 100% Physical Exam  Constitutional: He appears well-developed.  HENT:  Head: Atraumatic.  Cardiovascular: Normal rate.   Pulmonary/Chest: Effort normal.  Abdominal: Soft. There is no tenderness.  Musculoskeletal: He exhibits no edema.  Neurological:  Patient is somewhat confused. Able to answer commands but unable to tell me much what happened.  Skin:  Patient feels cool    ED Course  Procedures (including critical care time) Labs Review Labs Reviewed  CBC WITH DIFFERENTIAL/PLATELET - Abnormal; Notable for the following:    WBC 13.5 (*)    Neutro Abs 11.7 (*)    All other components within normal limits  COMPREHENSIVE METABOLIC PANEL - Abnormal; Notable for the following:    Sodium 134 (*)    Glucose, Bld 50 (*)    BUN 32 (*)     Creatinine, Ser 1.30 (*)    Calcium 8.6 (*)    AST 46 (*)    GFR calc non Af Amer 55 (*)    All other components within normal limits  URINALYSIS, ROUTINE W REFLEX MICROSCOPIC (NOT AT [facility]) - Abnormal; Notable for the following:    Specific Gravity, Urine >1.030 (*)    Glucose, UA 100 (*)    Protein, ur TRACE (*)    All other components within normal limits  URINE MICROSCOPIC-ADD ON - Abnormal; Notable for the  following:    Squamous Epithelial / LPF 6-30 (*)    Bacteria, UA FEW (*)    Casts HYALINE CASTS (*)    All other components within normal limits  CBG MONITORING, ED - Abnormal; Notable for the following:    Glucose-Capillary 42 (*)    All other components within normal limits  CBG MONITORING, ED - Abnormal; Notable for the following:    Glucose-Capillary 125 (*)    All other components within normal limits  CBG MONITORING, ED - Abnormal; Notable for the following:    Glucose-Capillary 128 (*)    All other components within normal limits  CULTURE, BLOOD (ROUTINE X 2)  CULTURE, BLOOD (ROUTINE X 2)  ETHANOL  URINE RAPID DRUG SCREEN, HOSP PERFORMED  TSH  TROPONIN I  DIGOXIN LEVEL  CBG MONITORING, ED  I-STAT CG4 LACTIC ACID, ED    Imaging Review Ct Head Wo Contrast  12/12/2015  CLINICAL DATA:  69 year old male with altered mental status EXAM: CT HEAD WITHOUT CONTRAST TECHNIQUE: Contiguous axial images were obtained from the base of the skull through the vertex without intravenous contrast. COMPARISON:  Head CT dated 03/18/2014 FINDINGS: There is dilatation of the ventricles out of proportion with the sulci which may represent central volume loss versus normal pressure hydrocephalus. Clinical correlation is recommended. Periventricular and deep white matter hypodensities represent chronic microvascular ischemic changes. Small focal left frontal old infarct and encephalomalacia. There is no intracranial hemorrhage. No mass effect or midline shift identified. The visualized  paranasal sinuses and mastoid air cells are well aerated. The calvarium is intact. IMPRESSION: No acute intracranial hemorrhage. Age-related atrophy and chronic microvascular ischemic disease. If symptoms persist and there are no contraindications, MRI may provide better evaluation if clinically indicated. Electronically Signed   By: Elgie CollardArash  Radparvar M.D.   On: 12/12/2015 21:02   Dg Chest Portable 1 View  12/12/2015  CLINICAL DATA:  Unresponsive hypoglycemic. EXAM: PORTABLE CHEST 1 VIEW COMPARISON:  09/26/2015 FINDINGS: There is no focal parenchymal opacity. There is no pleural effusion or pneumothorax. The heart and mediastinal contours are unremarkable. There is evidence of prior CABG. The osseous structures are unremarkable. IMPRESSION: No active disease. Electronically Signed   By: Elige KoHetal  Patel   On: 12/12/2015 20:02   I have personally reviewed and evaluated these images and lab results as part of my medical decision-making.   EKG Interpretation   Date/Time:  Wednesday December 12 2015 17:55:49 EST Ventricular Rate:  62 PR Interval:    QRS Duration: 136 QT Interval:  472 QTC Calculation: 479 R Axis:   -76 Text Interpretation:  Atrial fibrillation Ventricular premature complex  Nonspecific IVCD with LAD Anterior infarct, old Nonspecific T  abnormalities, inferior leads baseline artifact make for difficult  interpretation. Confirmed by Rubin PayorPICKERING  MD, Harrold DonathNATHAN [phone] on 12/12/2015  7:59:15 PM      MDM   Final diagnoses:  Hypoglycemia  Hypothermia, initial encounter    Patient altered mental status. Hypoglycemia and hypothermia. Has A. fib but history of same. CBG drop despite IV D50. Started on D5 drip. Admit to stepdown bed.  CRITICAL CARE Performed by: Billee CashingPICKERING,Sheila Ocasio R. Total critical care time: 30 minutes Critical care time was exclusive of separately billable procedures and treating other patients. Critical care was necessary to treat or prevent imminent or  life-threatening deterioration. Critical care was time spent personally by me on the following activities: development of treatment plan with patient and/or surrogate as well as nursing, discussions with consultants, evaluation of patient's response to  treatment, examination of patient, obtaining history from patient or surrogate, ordering and performing treatments and interventions, ordering and review of laboratory studies, ordering and review of radiographic studies, pulse oximetry and re-evaluation of patient's condition.      Benjiman Core, MD 12/12/15 2129

## 2015-12-12 NOTE — Progress Notes (Signed)
ANTIBIOTIC CONSULT NOTE-Preliminary  Pharmacy Consult for Vancomycin and Zosyn Indication: rule out sepsis  No Known Allergies  Patient Measurements: Height: 5\' 9"  (175.3 cm) Weight: 161 lb (73.029 kg) IBW/kg (Calculated) : 70.7  Vital Signs: Temp: 97.8 F (36.6 C) (12/21 2140) Temp Source: Oral (12/21 2140) BP: 92/49 mmHg (12/21 2130) Pulse Rate: 61 (12/21 2130)  Labs:  Recent Labs  12/12/15 1854  WBC 13.5*  HGB 14.7  PLT 227  CREATININE 1.30*    Estimated Creatinine Clearance: 54.4 mL/min (by C-G formula based on Cr of 1.3).  No results for input(s): VANCOTROUGH, VANCOPEAK, VANCORANDOM, GENTTROUGH, GENTPEAK, GENTRANDOM, TOBRATROUGH, TOBRAPEAK, TOBRARND, AMIKACINPEAK, AMIKACINTROU, AMIKACIN in the last 72 hours.   Microbiology: Recent Results (from the past 720 hour(s))  Culture, blood (routine x 2)     Status: None (Preliminary result)   Collection Time: 12/12/15  6:54 PM  Result Value Ref Range Status   Specimen Description BLOOD RIGHT ARM  Final   Special Requests BOTTLES DRAWN AEROBIC AND ANAEROBIC 3CC EACH  Final   Culture PENDING  Incomplete   Report Status PENDING  Incomplete  Culture, blood (routine x 2)     Status: None (Preliminary result)   Collection Time: 12/12/15  6:58 PM  Result Value Ref Range Status   Specimen Description BLOOD RIGHT HAND  Final   Special Requests   Final    BOTTLES DRAWN AEROBIC AND ANAEROBIC AEB 4CC ANA 3CC   Culture PENDING  Incomplete   Report Status PENDING  Incomplete    Medical History: Past Medical History  Diagnosis Date  . Coronary atherosclerosis of native coronary artery     Ant. MI in 4/95; PTCA of 90% prox & distal LAD unsuccessful-->  Urgent CABG-4/95 TO Cx; 50% RCA; ant. HK; EF of 50-55%; 08/2008: 3-V disease plus a  95% LIMA stenosis, treated with DES; occlusion of SVG to CX; RCA SVG stenosis--> PCI with DES  2010: in-stent restenosis in the LIMA-->cutting balloon; EF of 35-40%;  07/2010: TO of SVG to  RCA-medical therapy advised  . Atrial fibrillation (HCC)     1995, 2009; bradycardia with beta blocker therapy  . Peripheral vascular disease (HCC) 1995    Abdominal aortic obstruction 1995-->vascular surgery  . Hyperlipidemia   . Type 2 diabetes mellitus (HCC)   . DDD (degenerative disc disease)     Discectomy and fusion at L4 and L5  . History of alcohol abuse     Quit in 1999  . Chronic systolic heart failure (HCC)   . Non-compliance   . Ischemic cardiomyopathy     LVEF 40-45% August 2016   Anti-infectives    Start     Dose/Rate Route Frequency Ordered Stop   12/12/15 2200  piperacillin-tazobactam (ZOSYN) IVPB 3.375 g     3.375 g 100 mL/hr over 30 Minutes Intravenous  Once 12/12/15 2145     12/12/15 2200  vancomycin (VANCOCIN) IVPB 1000 mg/200 mL premix     1,000 mg 200 mL/hr over 60 Minutes Intravenous  Once 12/12/15 2145       Assessment: 69yo male, asked to initiate Vancomycin and Zosyn for suspected sepsis.  Estimated Creatinine Clearance: 54.4 mL/min (by C-G formula based on Cr of 1.3).  Goal of Therapy:  Eradicate infection.  Plan:  Preliminary review of pertinent patient information completed.  Protocol will be initiated with a one-time dose(s) of Vancomycin 1000mg  and Zosyn 3.375gm.  Jeani HawkingAnnie Penn clinical pharmacist will complete review during morning rounds to assess patient and finalize  treatment regimen.  Valrie Hart A, RPH 12/12/2015,10:00 PM

## 2015-12-12 NOTE — ED Notes (Signed)
Meal given to patient at this time.  

## 2015-12-12 NOTE — ED Notes (Signed)
Pt fed graham crackers and orange juice.

## 2015-12-12 NOTE — ED Notes (Signed)
Warm blankets applied to pt per Dr. Rubin PayorPickering.

## 2015-12-12 NOTE — ED Notes (Signed)
Dr. Rubin PayorPickering notified of cbg [phone]

## 2015-12-12 NOTE — ED Notes (Signed)
Per EMS: Pt unresponsive, fire dept checked cbg:60.  EMS checked pt at truck and had cbg 37, pt given amp d50 and currently is at 100 cbg.  Pt in a-fib.   1158/57, 55hr

## 2015-12-12 NOTE — H&P (Addendum)
[facility] Admission History and Physical       [name] ZOX:[id number] DOB: 11/08/1946 DOA: 12/12/2015  Referring physician: EDP PCP: [name], MD  Specialists:   Chief Complaint: Unresponsive  HPI: [name] is a 69 y.o. male with a history of Combined Systolic and Diastolic CHF, CAD, HTN, DM2, Hyperlipidemia and PVD who was brought to the ED emergently after being found unresponsive in his home by his wife.  The EMS arrived and found his blood glucose level at 60 and then it dropped to 37, he was administered IV Dextrose.  He was also found to have hypothermia with a temperature of 94.6.   In the ED, he was placed on the Warming Blanket, and his glucose level decreased again to f42, and then he was placed on IV Fluids with D5.   A Sepsis workup was also initiated.     Review of Systems: Unable to Obtain from Patient ( He does not remember)  Past Medical History  Diagnosis Date  . Coronary atherosclerosis of native coronary artery     Ant. MI in 4/95; PTCA of 90% prox & distal LAD unsuccessful-->  Urgent CABG-4/95 TO Cx; 50% RCA; ant. HK; EF of 50-55%; 08/2008: 3-V disease plus a  95% LIMA stenosis, treated with DES; occlusion of SVG to CX; RCA SVG stenosis--> PCI with DES  2010: in-stent restenosis in the LIMA-->cutting balloon; EF of 35-40%;  07/2010: TO of SVG to RCA-medical therapy advised  . Atrial fibrillation (HCC)     1995, 2009; bradycardia with beta blocker therapy  . Peripheral vascular disease (HCC) 1995    Abdominal aortic obstruction 1995-->vascular surgery  . Hyperlipidemia   . Type 2 diabetes mellitus (HCC)   . DDD (degenerative disc disease)     Discectomy and fusion at L4 and L5  . History of alcohol abuse     Quit in 1999  . Chronic systolic heart failure (HCC)   . Non-compliance   . Ischemic cardiomyopathy     LVEF 40-45% August 2016     Past Surgical History  Procedure Laterality Date  . Cholecystectomy  1995  . Lumbar  fusion      +Discectomy;x2; L4 and L5  . Aorto-femoral bypass graft  1995  . Left heart catheterization with coronary angiogram N/A 12/14/2013    Procedure: LEFT HEART CATHETERIZATION WITH CORONARY ANGIOGRAM;  Surgeon: Micheline Chapman, MD;  Location: [facility] CATH LAB;  Service: Cardiovascular;  Laterality: N/A;  . Back surgery        Prior to Admission medications   Medication Sig Start Date End Date Taking? Authorizing Provider  albuterol (PROVENTIL HFA;VENTOLIN HFA) 108 (90 BASE) MCG/ACT inhaler Inhale 2 puffs into the lungs every 6 (six) hours as needed for wheezing or shortness of breath.    Historical Provider, MD  carvedilol (COREG) 3.125 MG tablet Take 1 tablet (3.125 mg total) by mouth 2 (two) times daily with a meal. 08/10/15   Brittainy M Simmons, PA-C  carvedilol (COREG) 3.125 MG tablet Take 1 tablet (3.125 mg total) by mouth 2 (two) times daily. 11/06/15   Antoine Poche, MD  clopidogrel (PLAVIX) 75 MG tablet Take 1 tablet (75 mg total) by mouth daily. 08/10/15   Brittainy Sherlynn Carbon, PA-C  dabigatran (PRADAXA) 150 MG CAPS capsule Take 150 mg by mouth 2 (two) times daily.    Historical Provider, MD  digoxin (LANOXIN) 0.125 MG tablet Take 125 mcg by mouth daily.      Historical  Provider, MD  furosemide (LASIX) 40 MG tablet Take 1 tablet (40 mg total) by mouth every other day. 11/21/14   Antoine Poche, MD  insulin aspart (NOVOLOG FLEXPEN) 100 UNIT/ML FlexPen Inject 20 Units into the skin 3 (three) times daily with meals.    Historical Provider, MD  insulin detemir (LEVEMIR) 100 UNIT/ML injection Inject 40 Units into the skin daily.     Historical Provider, MD  isosorbide mononitrate (IMDUR) 30 MG 24 hr tablet Take 1 tablet (30 mg total) by mouth daily. 09/08/13   Butch Penny, MD  lisinopril (PRINIVIL,ZESTRIL) 5 MG tablet Take 1 tablet (5 mg total) by mouth daily. 02/28/14   Antoine Poche, MD  nitroGLYCERIN (NITROSTAT) 0.4 MG SL tablet Place 1 tablet (0.4 mg total) under the tongue  every 5 (five) minutes as needed. Patient taking differently: Place 0.4 mg under the tongue every 5 (five) minutes as needed for chest pain.  08/10/15   Brittainy Sherlynn Carbon, PA-C  potassium chloride SA (K-DUR,KLOR-CON) 20 MEQ tablet TAKE ONE TABLET BY MOUTH TWICE A DAY 11/21/15   Antoine Poche, MD  ranolazine (RANEXA) 1000 MG SR tablet Take 1 tablet (1,000 mg total) by mouth 2 (two) times daily. 08/10/15   Brittainy Sherlynn Carbon, PA-C  rosuvastatin (CRESTOR) 40 MG tablet Take 40 mg by mouth daily.    Historical Provider, MD  traMADol (ULTRAM) 50 MG tablet Take 1 tablet (50 mg total) by mouth every 6 (six) hours as needed. 08/29/15   Mancel Bale, MD     No Known Allergies    Social History:  reports that he has been smoking Cigarettes.  He has a 20 pack-year smoking history. He uses smokeless tobacco. He reports that he does not drink alcohol or use illicit drugs.     Family History  Problem Relation Age of Onset  . CAD Father        Physical Exam:  GEN:    Pleasant Thin Disheveled Elderly 69 y.o. Caucasian male examined and in no acute distress; cooperative with exam Filed Vitals:   12/12/15 2030 12/12/15 2100 12/12/15 2130 12/12/15 2140  BP: 90/56 111/60 92/49   Pulse: 63 67 61   Temp:    97.8 F (36.6 C)  TempSrc:    Oral  Resp: Height:      Weight:      SpO2: 99% 100% 97%    Blood pressure 92/49, pulse 61, temperature 97.8 F (36.6 C), temperature source Oral, resp. rate 18, height  (1.753 m), weight 73.029 kg (161 lb), SpO2 97 %. PSYCH: He is alert and oriented x4; does not appear anxious does not appear depressed; affect is normal HEENT: Normocephalic and Atraumatic, Mucous membranes pink; PERRLA; EOM intact; Fundi:  Benign;  No scleral icterus, Nares: Patent, Oropharynx: Clear, Poor Dentition,    Neck:  FROM, No Cervical Lymphadenopathy nor Thyromegaly or Carotid Bruit; No JVD; Breasts:: Not examined CHEST WALL: No tenderness CHEST: Normal respiration,  clear to auscultation bilaterally HEART: Irregular rate and rhythm; no murmurs rubs or gallops BACK: No kyphosis or scoliosis; No CVA tenderness ABDOMEN: Positive Bowel Sounds, Scaphoid, Soft Non-Tender, No Rebound or Guarding; No Masses, No Organomegaly. Rectal Exam: Not done EXTREMITIES: No Cyanosis, Clubbing, or Edema; No Ulcerations. Genitalia: not examined PULSES: 2+ and symmetric SKIN: Normal hydration no rash or ulceration CNS:  Alert and Oriented x 4, No Focal Deficits Vascular: pulses palpable throughout    Labs on Admission:  Basic Metabolic  Panel:  Recent Labs Lab 12/12/15 1854  NA 134*  K 4.8  CL 106  CO2 22  GLUCOSE 50*  BUN 32*  CREATININE 1.30*  CALCIUM 8.6*   Liver Function Tests:  Recent Labs Lab 12/12/15 1854  AST 46*  ALT 22  ALKPHOS 69  BILITOT 0.6  PROT 7.2  ALBUMIN 3.9   No results for input(s): LIPASE, AMYLASE in the last 168 hours. No results for input(s): AMMONIA in the last 168 hours. CBC:  Recent Labs Lab 12/12/15 1854  WBC 13.5*  NEUTROABS 11.7*  HGB 14.7  HCT 43.7  MCV 95.6  PLT 227   Cardiac Enzymes:  Recent Labs Lab 12/12/15 1854  TROPONINI <0.03    BNP (last 3 results)  Recent Labs  06/10/15 0616 08/08/15 1836 09/02/15 2334  BNP 174.0* 238.0* 83.0    ProBNP (last 3 results) No results for input(s): PROBNP in the last 8760 hours.  CBG:  Recent Labs Lab 12/12/15 1756 12/12/15 1840 12/12/15 1911 12/12/15 2110  GLUCAP 83 42* 125* 128*    Radiological Exams on Admission: Ct Head Wo Contrast  12/12/2015  CLINICAL DATA:  69 year old male with altered mental status EXAM: CT HEAD WITHOUT CONTRAST TECHNIQUE: Contiguous axial images were obtained from the base of the skull through the vertex without intravenous contrast. COMPARISON:  Head CT dated 03/18/2014 FINDINGS: There is dilatation of the ventricles out of proportion with the sulci which may represent central volume loss versus normal pressure  hydrocephalus. Clinical correlation is recommended. Periventricular and deep white matter hypodensities represent chronic microvascular ischemic changes. Small focal left frontal old infarct and encephalomalacia. There is no intracranial hemorrhage. No mass effect or midline shift identified. The visualized paranasal sinuses and mastoid air cells are well aerated. The calvarium is intact. IMPRESSION: No acute intracranial hemorrhage. Age-related atrophy and chronic microvascular ischemic disease. If symptoms persist and there are no contraindications, MRI may provide better evaluation if clinically indicated. Electronically Signed   By: Elgie Collard M.D.   On: 12/12/2015 21:02   Dg Chest Portable 1 View  12/12/2015  CLINICAL DATA:  Unresponsive hypoglycemic. EXAM: PORTABLE CHEST 1 VIEW COMPARISON:  09/26/2015 FINDINGS: There is no focal parenchymal opacity. There is no pleural effusion or pneumothorax. The heart and mediastinal contours are unremarkable. There is evidence of prior CABG. The osseous structures are unremarkable. IMPRESSION: No active disease. Electronically Signed   By: Elige Ko   On: 12/12/2015 20:02     EKG: Independently reviewed. Atrial fibrillation rate = 62        Assessment/Plan:      69 y.o. male with  Active Problems:   1.     Hypoglycemia   Placed on D5NSS IVFs   Hold Insulin  And Diabetic Rx   Monitor Glucose Levels hourly x 12     2.     SIRS (systemic inflammatory response syndrome) (HCC)   Sepsis Workup initiated   Empiric IV Vancomycin and Zosyn       3.     Hypotension   IVFs     4.     Hypothermia- due to #1, and #2   Warming Blanket until Normalized     5.     Chronic anticoagulation   Continue Pradaxa Rx     6.     Atrial fibrillation (HCC)   On Pradaxa Rx and Carvedilol    Carvedilol on Hold due to Hypotension     7.     Chronic combined  systolic and diastolic CHF, NYHA class 3 (HCC)   Monitor I/Os   Resume Lasix Rx when  Euvolemia     8      Diabetes (HCC)   On Levemir Insulin and TID meal coverage at home    further adjustment when glucose normalizes, and PO intake assessed     9.     Hyperlipidemia   On Creator Rx  10.     PERIPHERAL VASCULAR DISEASE   Continue Plavix   11.     TOBACCO ABUSE   Nicotine Patch daily    12.      DVT Prophylaxis   On Pradaxa Rx       13.    AKI-  IVFs  Monitor BUN/Cr    Code Status:     FULL CODE  Family Communication:   No Family Present    Disposition Plan:    Inpatient Status        Time spent:   1270 Minutes      Ron ParkerJENKINS,Coleston Dirosa C [facility] Pager [phone]   If 7AM -7PM Please Contact the Day Rounding Team MD for [facility]  If 7PM-7AM, Please Contact Night-Floor Coverage  [email or web address] Password [email or web address] 12/12/2015, 9:46 PM     ADDENDUM:   Patient was seen and examined on 12/12/2015

## 2015-12-12 NOTE — ED Notes (Signed)
Pt incontinence addressed, diaper changed, cleaned with soap.  Pt also placed on bair hugger.  Pt alert and oriented.

## 2015-12-12 NOTE — ED Notes (Signed)
Pt ordered a meal tray.

## 2015-12-13 LAB — CBC
HEMATOCRIT: 36.4 % — AB (ref 39.0–52.0)
HEMOGLOBIN: 12.1 g/dL — AB (ref 13.0–17.0)
MCH: 31.8 pg (ref 26.0–34.0)
MCHC: 33.2 g/dL (ref 30.0–36.0)
MCV: 95.8 fL (ref 78.0–100.0)
Platelets: 197 10*3/uL (ref 150–400)
RBC: 3.8 MIL/uL — AB (ref 4.22–5.81)
RDW: 13.7 % (ref 11.5–15.5)
WBC: 11 10*3/uL — AB (ref 4.0–10.5)

## 2015-12-13 LAB — BASIC METABOLIC PANEL
Anion gap: 8 (ref 5–15)
BUN: 25 mg/dL — ABNORMAL HIGH (ref 6–20)
CHLORIDE: 106 mmol/L (ref 101–111)
CO2: 19 mmol/L — ABNORMAL LOW (ref 22–32)
Calcium: 7.8 mg/dL — ABNORMAL LOW (ref 8.9–10.3)
Creatinine, Ser: 1.31 mg/dL — ABNORMAL HIGH (ref 0.61–1.24)
GFR calc non Af Amer: 54 mL/min — ABNORMAL LOW (ref >60)
Glucose, Bld: 162 mg/dL — ABNORMAL HIGH (ref 65–99)
POTASSIUM: 4.8 mmol/L (ref 3.5–5.1)
SODIUM: 133 mmol/L — AB (ref 135–145)

## 2015-12-13 LAB — GLUCOSE, CAPILLARY
GLUCOSE-CAPILLARY: 165 mg/dL — AB (ref 65–99)
GLUCOSE-CAPILLARY: 130 mg/dL — AB (ref 65–99)
Glucose-Capillary: 191 mg/dL — ABNORMAL HIGH (ref 65–99)
Glucose-Capillary: 153 mg/dL — ABNORMAL HIGH (ref 65–99)
Glucose-Capillary: 156 mg/dL — ABNORMAL HIGH (ref 65–99)
Glucose-Capillary: 124 mg/dL — ABNORMAL HIGH (ref 65–99)
Glucose-Capillary: 144 mg/dL — ABNORMAL HIGH (ref 65–99)

## 2015-12-13 LAB — MRSA PCR SCREENING: MRSA BY PCR: NEGATIVE

## 2015-12-13 LAB — CBG MONITORING, ED
Glucose-Capillary: 163 mg/dL — ABNORMAL HIGH (ref 65–99)
Glucose-Capillary: 171 mg/dL — ABNORMAL HIGH (ref 65–99)

## 2015-12-13 LAB — LACTIC ACID, PLASMA: LACTIC ACID, VENOUS: 1.6 mmol/L (ref 0.5–2.0)

## 2015-12-13 MED ORDER — VANCOMYCIN HCL IN DEXTROSE 1-5 GM/200ML-% IV SOLN
1000.0000 mg | Freq: Two times a day (BID) | INTRAVENOUS | Status: DC
  Administered 2015-12-13 – 2015-12-15 (×5): 1000 mg via INTRAVENOUS
  Filled 2015-12-13 (×3): qty 200

## 2015-12-13 MED ORDER — PIPERACILLIN-TAZOBACTAM 3.375 G IVPB
3.3750 g | Freq: Three times a day (TID) | INTRAVENOUS | Status: DC
Start: 2015-12-13 — End: 2015-12-15
  Administered 2015-12-13 – 2015-12-15 (×7): 3.375 g via INTRAVENOUS
  Filled 2015-12-13 (×5): qty 50

## 2015-12-13 MED ORDER — OXYCODONE HCL 5 MG PO TABS: 5.0000 mg | ORAL_TABLET | ORAL | Status: DC | PRN

## 2015-12-13 MED ORDER — DIGOXIN 125 MCG PO TABS
125.0000 ug | ORAL_TABLET | Freq: Every day | ORAL | Status: DC
Start: 2015-12-13 — End: 2015-12-15
  Administered 2015-12-13 – 2015-12-15 (×3): 125 ug via ORAL
  Filled 2015-12-13 (×3): qty 1

## 2015-12-13 MED ORDER — ACETAMINOPHEN 325 MG PO TABS: 650.0000 mg | ORAL_TABLET | Freq: Four times a day (QID) | ORAL | Status: DC | PRN

## 2015-12-13 MED ORDER — DEXTROSE-NACL 5-0.45 % IV SOLN: INTRAVENOUS | Status: DC

## 2015-12-13 MED ORDER — ACETAMINOPHEN 650 MG RE SUPP
650.0000 mg | Freq: Four times a day (QID) | RECTAL | Status: DC | PRN
Start: 2015-12-13 — End: 2015-12-15

## 2015-12-13 MED ORDER — ROSUVASTATIN CALCIUM 20 MG PO TABS
40.0000 mg | ORAL_TABLET | Freq: Every day | ORAL | Status: DC
  Administered 2015-12-13 – 2015-12-15 (×3): 40 mg via ORAL
  Filled 2015-12-13 (×3): qty 2

## 2015-12-13 MED ORDER — HYDROMORPHONE HCL 1 MG/ML IJ SOLN: 0.5000 mg | INTRAMUSCULAR | Status: DC | PRN

## 2015-12-13 MED ORDER — ALUM & MAG HYDROXIDE-SIMETH 200-200-20 MG/5ML PO SUSP: 30.0000 mL | Freq: Four times a day (QID) | ORAL | Status: DC | PRN

## 2015-12-13 MED ORDER — ONDANSETRON HCL 4 MG PO TABS: 4.0000 mg | ORAL_TABLET | Freq: Four times a day (QID) | ORAL | Status: DC | PRN

## 2015-12-13 MED ORDER — DEXTROSE-NACL 5-0.9 % IV SOLN: INTRAVENOUS | Status: DC

## 2015-12-13 MED ORDER — CLOPIDOGREL BISULFATE 75 MG PO TABS
75.0000 mg | ORAL_TABLET | Freq: Every day | ORAL | Status: DC
  Administered 2015-12-13 – 2015-12-15 (×3): 75 mg via ORAL
  Filled 2015-12-13 (×3): qty 1

## 2015-12-13 MED ORDER — ONDANSETRON HCL 4 MG/2ML IJ SOLN: 4.0000 mg | Freq: Four times a day (QID) | INTRAMUSCULAR | Status: DC | PRN

## 2015-12-13 MED ORDER — RANOLAZINE ER 500 MG PO TB12
1000.0000 mg | ORAL_TABLET | Freq: Two times a day (BID) | ORAL | Status: DC
  Administered 2015-12-13 – 2015-12-15 (×5): 1000 mg via ORAL
  Filled 2015-12-13 (×7): qty 2

## 2015-12-13 MED ORDER — INSULIN ASPART 100 UNIT/ML ~~LOC~~ SOLN
0.0000 [IU] | SUBCUTANEOUS | Status: DC
  Administered 2015-12-13: 1 [IU] via SUBCUTANEOUS
  Administered 2015-12-13: 2 [IU] via SUBCUTANEOUS
  Administered 2015-12-13 (×2): 1 [IU] via SUBCUTANEOUS
  Administered 2015-12-14: 3 [IU] via SUBCUTANEOUS
  Administered 2015-12-14: 5 [IU] via SUBCUTANEOUS
  Administered 2015-12-14: 3 [IU] via SUBCUTANEOUS
  Administered 2015-12-14: 2 [IU] via SUBCUTANEOUS
  Administered 2015-12-15: 3 [IU] via SUBCUTANEOUS
  Administered 2015-12-15: 1 [IU] via SUBCUTANEOUS

## 2015-12-13 MED ORDER — DABIGATRAN ETEXILATE MESYLATE 75 MG PO CAPS
150.0000 mg | ORAL_CAPSULE | Freq: Two times a day (BID) | ORAL | Status: DC
  Administered 2015-12-13 – 2015-12-15 (×5): 150 mg via ORAL
  Filled 2015-12-13 (×2): qty 1
  Filled 2015-12-13: qty 2
  Filled 2015-12-13: qty 1
  Filled 2015-12-13 (×2): qty 2

## 2015-12-13 NOTE — Progress Notes (Signed)
ANTIBIOTIC CONSULT NOTE-follow up  Pharmacy Consult for Vancomycin and Zosyn Indication: rule out sepsis  No Known Allergies  Patient Measurements: Height:  (175.3 cm) Weight: 156 lb 8.4 oz (71 kg) IBW/kg (Calculated) : 70.7  Vital Signs: Temp: 97.5 F (36.4 C) (12/22 0400) Temp Source: Oral (12/22 0400) BP: 129/66 mmHg (12/22 0600) Pulse Rate: 74 (12/22 0600)  Labs:  Recent Labs  12/12/15 1854 12/13/15 0437  WBC 13.5* 11.0*  HGB 14.7 12.1*  PLT 227 197  CREATININE 1.30* 1.31*    Estimated Creatinine Clearance: 54 mL/min (by C-G formula based on Cr of 1.31).  No results for input(s): VANCOTROUGH, VANCOPEAK, VANCORANDOM, GENTTROUGH, GENTPEAK, GENTRANDOM, TOBRATROUGH, TOBRAPEAK, TOBRARND, AMIKACINPEAK, AMIKACINTROU, AMIKACIN in the last 72 hours.   Microbiology: Recent Results (from the past 720 hour(s))  Culture, blood (routine x 2)     Status: None (Preliminary result)   Collection Time: 12/12/15  6:54 PM  Result Value Ref Range Status   Specimen Description BLOOD RIGHT ARM  Final   Special Requests BOTTLES DRAWN AEROBIC AND ANAEROBIC 3CC EACH  Final   Culture PENDING  Incomplete   Report Status PENDING  Incomplete  Culture, blood (routine x 2)     Status: None (Preliminary result)   Collection Time: 12/12/15  6:58 PM  Result Value Ref Range Status   Specimen Description BLOOD RIGHT HAND  Final   Special Requests   Final    BOTTLES DRAWN AEROBIC AND ANAEROBIC AEB 4CC ANA 3CC   Culture PENDING  Incomplete   Report Status PENDING  Incomplete  MRSA PCR Screening     Status: None   Collection Time: 12/13/15  3:00 AM  Result Value Ref Range Status   MRSA by PCR NEGATIVE NEGATIVE Final    Comment:        The GeneXpert MRSA Assay (FDA approved for NASAL specimens only), is one component of a comprehensive MRSA colonization surveillance program. It is not intended to diagnose MRSA infection nor to guide or monitor treatment for MRSA infections.      Medical History: Past Medical History  Diagnosis Date  . Coronary atherosclerosis of native coronary artery     Ant. MI in 4/95; PTCA of 90% prox & distal LAD unsuccessful-->  Urgent CABG-4/95 TO Cx; 50% RCA; ant. HK; EF of 50-55%; 08/2008: 3-V disease plus a  95% LIMA stenosis, treated with DES; occlusion of SVG to CX; RCA SVG stenosis--> PCI with DES  2010: in-stent restenosis in the LIMA-->cutting balloon; EF of 35-40%;  07/2010: TO of SVG to RCA-medical therapy advised  . Atrial fibrillation (HCC)     1995, 2009; bradycardia with beta blocker therapy  . Peripheral vascular disease (HCC) 1995    Abdominal aortic obstruction 1995-->vascular surgery  . Hyperlipidemia   . Type 2 diabetes mellitus (HCC)   . DDD (degenerative disc disease)     Discectomy and fusion at L4 and L5  . History of alcohol abuse     Quit in 1999  . Chronic systolic heart failure (HCC)   . Non-compliance   . Ischemic cardiomyopathy     LVEF 40-45% August 2016   Anti-infectives    Start     Dose/Rate Route Frequency Ordered Stop   12/13/15 0600  piperacillin-tazobactam (ZOSYN) IVPB 3.375 g     3.375 g 12.5 mL/hr over 240 Minutes Intravenous Every 8 hours 12/13/15 0332     12/13/15 0600  vancomycin (VANCOCIN) IVPB 1000 mg/200 mL premix     1,000  mg 200 mL/hr over 60 Minutes Intravenous Every 12 hours 12/13/15 0332     12/12/15 2200  piperacillin-tazobactam (ZOSYN) IVPB 3.375 g     3.375 g 100 mL/hr over 30 Minutes Intravenous  Once 12/12/15 2145 12/12/15 2236   12/12/15 2200  vancomycin (VANCOCIN) IVPB 1000 mg/200 mL premix     1,000 mg 200 mL/hr over 60 Minutes Intravenous  Once 12/12/15 2145 12/13/15 0011    Vancomycin 12/1 >> Zosyn 12/21 >>  Assessment: 69yo male, asked to initiate Vancomycin and Zosyn for suspected sepsis.  Estimated Creatinine Clearance: 54 mL/min (by C-G formula based on Cr of 1.31).  Since pt was not given a loading dose of Vancomycin will start second dose a bit early.  Goal  of Therapy:  Vancomycin trough level 15-20 mcg/ml Eradicate infection.  Plan: Zosyn 3.375gm IV q8h, EID Vancomycin 1000mg  IV q12hrs Check trough level at steady state Monitor labs, renal fxn and cultures / progress Deescalate ABX when improved / appropriate  Wayland DenisHall, Uchenna Seufert A, RPH 12/13/2015,8:04 AM

## 2015-12-13 NOTE — Clinical Social Work Note (Signed)
Clinical Social Work Assessment  Patient Details  Name: [name] MRN: [id number] Date of Birth: 10/09/1946  Date of referral:  12/13/15               Reason for consult:   (Per referral "severe poor hygiene, hypoglycemic, extreme hunger")                Permission sought to share information with:    Permission granted to share information::     Name::        Agency::     Relationship::     Contact Information:     Housing/Transportation Living arrangements for the past 2 months:  Single Family Home Source of Information:  Patient Patient Interpreter Needed:  None Criminal Activity/Legal Involvement Pertinent to Current Situation/Hospitalization:  No - Comment as needed Significant Relationships:  Spouse Lives with:  Spouse Do you feel safe going back to the place where you live?  Yes Need for family participation in patient care:  No (Coment)  Care giving concerns:  No concerns expressed by patient.   Social Worker assessment / plan: CSW met with patient who advised that he resides with his wife. He stated that there were no issues with basic necessities such as food, clothing shelter.  He stated that all of his needs are met. Patient stated that he uses a walker for ambulation and completes ADLs independently.  Patient did not have CSW needs. CSW signing off .    Employment status:  Retired Insurance information:  Medicare PT Recommendations:  Not assessed at this time Information / Referral to community resources:     Patient/Family's Response to care:  Patient had no needs and did not understand why CSW referral was made.  Patient/Family's Understanding of and Emotional Response to Diagnosis, Current Treatment, and Prognosis:  Patient has understanding.   Emotional Assessment Appearance:  Appears stated age Attitude/Demeanor/Rapport:   (Cooperative) Affect (typically observed):  Calm Orientation:  Oriented to Self, Oriented to Place, Oriented to  Time, Oriented  to Situation Alcohol / Substance use:  Not Applicable Psych involvement (Current and /or in the community):  No (Comment)  Discharge Needs  Concerns to be addressed:  No discharge needs identified Readmission within the last 30 days:  No Current discharge risk:  None Barriers to Discharge:  No Barriers Identified   ,  D, LCSW 12/13/2015, 12:20 PM [phone] 

## 2015-12-13 NOTE — Progress Notes (Signed)
Arrived to room from ICU via wheelchair to Room 329, IV Zosyn infusing.  Assisted X 2 to bed, patient pulled condom cath off in ICU, urinal given.  On Enteric precautions for loose stools at home, no bowel movement since admission.

## 2015-12-13 NOTE — Progress Notes (Signed)
Patient had unresponsive episode at home associated with recurrent hypoglycemia and insulin dependent diabetic this most likely was the cause sepsis workup initiated empirically placed on thank and Zosyn patient is in chronic A. fib ventricular response of about 60-70 bpm EF 40-45% in August 1916. Patient is a poor candidate for anticoagulation frequent falls Cala Bradfordugene F Blatchley ZOX:[id number] DOB: Mar 15, 1946 DOA: 12/12/2015 PCP: Isabella StallingNDIEGO,Colette Dicamillo M, MD             Physical Exam: Blood pressure 129/66, pulse 74, temperature 97 F (36.1 C), temperature source Oral, resp. rate 19, height 5\' 9"  (1.753 m), weight 156 lb 8.4 oz (71 kg), SpO2 99 %. lungs clear to A&P prolonged expiratory phase diminished breath sounds in the bases no rales no wheezes discernible heart irregular regular no S3 no heave thrills rubs abdomen soft nontender bowel sounds normoactive   Investigations:  Recent Results (from the past 240 hour(s))  Culture, blood (routine x 2)     Status: None (Preliminary result)   Collection Time: 12/12/15  6:54 PM  Result Value Ref Range Status   Specimen Description BLOOD RIGHT ARM  Final   Special Requests BOTTLES DRAWN AEROBIC AND ANAEROBIC 3CC EACH  Final   Culture PENDING  Incomplete   Report Status PENDING  Incomplete  Culture, blood (routine x 2)     Status: None (Preliminary result)   Collection Time: 12/12/15  6:58 PM  Result Value Ref Range Status   Specimen Description BLOOD RIGHT HAND  Final   Special Requests   Final    BOTTLES DRAWN AEROBIC AND ANAEROBIC AEB 4CC ANA 3CC   Culture PENDING  Incomplete   Report Status PENDING  Incomplete  MRSA PCR Screening     Status: None   Collection Time: 12/13/15  3:00 AM  Result Value Ref Range Status   MRSA by PCR NEGATIVE NEGATIVE Final    Comment:        The GeneXpert MRSA Assay (FDA approved for NASAL specimens only), is one component of a comprehensive MRSA colonization surveillance program. It is not intended to  diagnose MRSA infection nor to guide or monitor treatment for MRSA infections.      Basic Metabolic Panel:  Recent Labs  81/19/1412/21/16 1854 12/13/15 0437  NA 134* 133*  K 4.8 4.8  CL 106 106  CO2 22 19*  GLUCOSE 50* 162*  BUN 32* 25*  CREATININE 1.30* 1.31*  CALCIUM 8.6* 7.8*   Liver Function Tests:  Recent Labs  12/12/15 1854  AST 46*  ALT 22  ALKPHOS 69  BILITOT 0.6  PROT 7.2  ALBUMIN 3.9     CBC:  Recent Labs  12/12/15 1854 12/13/15 0437  WBC 13.5* 11.0*  NEUTROABS 11.7*  --   HGB 14.7 12.1*  HCT 43.7 36.4*  MCV 95.6 95.8  PLT 227 197    Ct Head Wo Contrast  12/12/2015  CLINICAL DATA:  69 year old male with altered mental status EXAM: CT HEAD WITHOUT CONTRAST TECHNIQUE: Contiguous axial images were obtained from the base of the skull through the vertex without intravenous contrast. COMPARISON:  Head CT dated 03/18/2014 FINDINGS: There is dilatation of the ventricles out of proportion with the sulci which may represent central volume loss versus normal pressure hydrocephalus. Clinical correlation is recommended. Periventricular and deep white matter hypodensities represent chronic microvascular ischemic changes. Small focal left frontal old infarct and encephalomalacia. There is no intracranial hemorrhage. No mass effect or midline shift identified. The visualized paranasal sinuses and mastoid air cells are  well aerated. The calvarium is intact. IMPRESSION: No acute intracranial hemorrhage. Age-related atrophy and chronic microvascular ischemic disease. If symptoms persist and there are no contraindications, MRI may provide better evaluation if clinically indicated. Electronically Signed   By: Elgie Collard M.D.   On: 12/12/2015 21:02   Dg Chest Portable 1 View  12/12/2015  CLINICAL DATA:  Unresponsive hypoglycemic. EXAM: PORTABLE CHEST 1 VIEW COMPARISON:  09/26/2015 FINDINGS: There is no focal parenchymal opacity. There is no pleural effusion or pneumothorax.  The heart and mediastinal contours are unremarkable. There is evidence of prior CABG. The osseous structures are unremarkable. IMPRESSION: No active disease. Electronically Signed   By: Elige Ko   On: 12/12/2015 20:02      Medications:   Impression: Hypoglycemia Unresponsive episode  Active Problems:   Diabetes (HCC)   Hyperlipidemia   TOBACCO ABUSE   PERIPHERAL VASCULAR DISEASE   Atrial fibrillation (HCC)   Chronic anticoagulation   Chronic combined systolic and diastolic CHF, NYHA class 3 (HCC)   Hypoglycemia   SIRS (systemic inflammatory response syndrome) (HCC)   Hypothermia   Hypotension     Plan: Monitor glycemic control with before meals and at bedtime glucoses patient recently had insulin dosages increased most likely accountable for the above admitting symptoms however sepsis and dysrhythmias and bradycardia are still in the differential  Consultants:    Procedures   Antibiotics: Vancomycin and Zosyn                  Code Status: Full  Family Communication:  Spoke with patient  Disposition Plan see plan above  Time spent:   LOS: 1 day   Malaysha Arlen M   12/13/2015, 7:01 AM

## 2015-12-13 NOTE — Care Management Note (Signed)
Case Management Note  Patient Details  Name: [name] MRN: [id number] Date of Birth: Jul 14, 1946  Subjective/Objective:                  Pt is from home, lives with his wife and states he is ind with ADL's. Pt uses a cane and has a walker. Pt has had no HH services in the past but is currently agreeable to having an nurse follow with him at DC. [facility]   Action/Plan: [facility]. Alroy BailiffLinda Lothian, [facility], made aware and will obtain pt info from chart. Will cont to follow and update [facility] of DC status.   Expected Discharge Date:    12/15/2015              Expected Discharge Plan:  Home w Home Health Services  In-House Referral:  Clinical Social Work  Discharge planning Services  CM Consult  Post Acute Care Choice:  Home Health Choice offered to:  Patient  DME Arranged:    DME Agency:     HH Arranged:  RN HH Agency:  [facility]  Status of Service:  In process, will continue to follow  Medicare Important Message Given:    Date Medicare IM Given:    Medicare IM give by:    Date Additional Medicare IM Given:    Additional Medicare Important Message give by:     If discussed at Long Length of Stay Meetings, dates discussed:    Additional Comments:  Malcolm MetroChildress, Ajani Schnieders Demske, RN 12/13/2015, 11:20 AM

## 2015-12-13 NOTE — Progress Notes (Signed)
Report called to K. Woods,RN. Patient transferred to 329 in stable condition with NT.

## 2015-12-14 LAB — GLUCOSE, CAPILLARY
GLUCOSE-CAPILLARY: 169 mg/dL — AB (ref 65–99)
GLUCOSE-CAPILLARY: 205 mg/dL — AB (ref 65–99)
Glucose-Capillary: 80 mg/dL (ref 65–99)
Glucose-Capillary: 292 mg/dL — ABNORMAL HIGH (ref 65–99)
Glucose-Capillary: 207 mg/dL — ABNORMAL HIGH (ref 65–99)

## 2015-12-14 LAB — C DIFFICILE QUICK SCREEN W PCR REFLEX
C Diff antigen: NEGATIVE
C Diff interpretation: NEGATIVE
C Diff toxin: NEGATIVE

## 2015-12-14 LAB — CBC WITH DIFFERENTIAL/PLATELET
BASOS ABS: 0.1 10*3/uL (ref 0.0–0.1)
Basophils Relative: 0 %
Eosinophils Absolute: 0.1 10*3/uL (ref 0.0–0.7)
Eosinophils Relative: 1 %
HEMATOCRIT: 37.6 % — AB (ref 39.0–52.0)
HEMOGLOBIN: 13 g/dL (ref 13.0–17.0)
LYMPHS PCT: 12 %
Lymphs Abs: 1.4 10*3/uL (ref 0.7–4.0)
MCH: 32.5 pg (ref 26.0–34.0)
MCHC: 34.6 g/dL (ref 30.0–36.0)
MCV: 94 fL (ref 78.0–100.0)
MONO ABS: 1.3 10*3/uL — AB (ref 0.1–1.0)
MONOS PCT: 11 %
NEUTROS ABS: 8.5 10*3/uL — AB (ref 1.7–7.7)
NEUTROS PCT: 76 %
Platelets: 197 10*3/uL (ref 150–400)
RBC: 4 MIL/uL — ABNORMAL LOW (ref 4.22–5.81)
RDW: 13.3 % (ref 11.5–15.5)
WBC: 11.4 10*3/uL — ABNORMAL HIGH (ref 4.0–10.5)

## 2015-12-14 LAB — BASIC METABOLIC PANEL
Anion gap: 8 (ref 5–15)
BUN: 15 mg/dL (ref 6–20)
CHLORIDE: 102 mmol/L (ref 101–111)
CO2: 22 mmol/L (ref 22–32)
CREATININE: 1.12 mg/dL (ref 0.61–1.24)
Calcium: 8.4 mg/dL — ABNORMAL LOW (ref 8.9–10.3)
GFR calc Af Amer: >60 mL/min (ref >60)
GLUCOSE: 105 mg/dL — AB (ref 65–99)
Potassium: 4.6 mmol/L (ref 3.5–5.1)
Sodium: 132 mmol/L — ABNORMAL LOW (ref 135–145)

## 2015-12-14 NOTE — Progress Notes (Signed)
C-Diff negative, taken off enteric precautions.

## 2015-12-14 NOTE — Care Management Important Message (Signed)
Important Message  Patient Details  Name: [name] MRN: [id number] Date of Birth: 1946/04/24   Medicare Important Message Given:  Yes    Cheryl FlashBlackwell, Aziya Arena Crowder, RN 12/14/2015, 12:06 PM

## 2015-12-14 NOTE — Progress Notes (Signed)
Patient continues to be normal glycemic with just sliding scale sensitive coverage a.m. glucose was 80 today seems to require less insulin than previous currently on IV Vanco and Zosyn empirically no evidence of septicemia awaiting lactic acid and CBC this a.m. [name] ZOX:[id number] DOB: 1946-12-09 DOA: 12/12/2015 PCP: Isabella StallingNDIEGO,[name], MD             Physical Exam: Blood pressure 113/60, pulse 75, temperature 97.5 F (36.4 C), temperature source Oral, resp. rate 20, height 5\' 9"  (1.753 m), weight 156 lb 8.4 oz (71 kg), SpO2 100 %. lungs show prolonged expiratory phase scattered rhonchi no rales no wheezes heart irregular rhythm no S3- no heaves thrills rubs   Investigations:  Recent Results (from the past 240 hour(s))  Culture, blood (routine x 2)     Status: None (Preliminary result)   Collection Time: 12/12/15  6:54 PM  Result Value Ref Range Status   Specimen Description BLOOD RIGHT ARM  Final   Special Requests BOTTLES DRAWN AEROBIC AND ANAEROBIC 3CC EACH  Final   Culture NO GROWTH 2 DAYS  Final   Report Status PENDING  Incomplete  Culture, blood (routine x 2)     Status: None (Preliminary result)   Collection Time: 12/12/15  6:58 PM  Result Value Ref Range Status   Specimen Description BLOOD RIGHT HAND  Final   Special Requests   Final    BOTTLES DRAWN AEROBIC AND ANAEROBIC AEB=4CC ANA=3CC   Culture NO GROWTH 2 DAYS  Final   Report Status PENDING  Incomplete  MRSA PCR Screening     Status: None   Collection Time: 12/13/15  3:00 AM  Result Value Ref Range Status   MRSA by PCR NEGATIVE NEGATIVE Final    Comment:        The GeneXpert MRSA Assay (FDA approved for NASAL specimens only), is one component of a comprehensive MRSA colonization surveillance program. It is not intended to diagnose MRSA infection nor to guide or monitor treatment for MRSA infections.      Basic Metabolic Panel:  Recent Labs  81/19/1412/22/16 0437 12/14/15 0555  NA 133* 132*   K 4.8 4.6  CL 106 102  CO2 19* 22  GLUCOSE 162* 105*  BUN 25* 15  CREATININE 1.31* 1.12  CALCIUM 7.8* 8.4*   Liver Function Tests:  Recent Labs  12/12/15 1854  AST 46*  ALT 22  ALKPHOS 69  BILITOT 0.6  PROT 7.2  ALBUMIN 3.9     CBC:  Recent Labs  12/12/15 1854 12/13/15 0437  WBC 13.5* 11.0*  NEUTROABS 11.7*  --   HGB 14.7 12.1*  HCT 43.7 36.4*  MCV 95.6 95.8  PLT 227 197    Ct Head Wo Contrast  12/12/2015  CLINICAL DATA:  69 year old male with altered mental status EXAM: CT HEAD WITHOUT CONTRAST TECHNIQUE: Contiguous axial images were obtained from the base of the skull through the vertex without intravenous contrast. COMPARISON:  Head CT dated 03/18/2014 FINDINGS: There is dilatation of the ventricles out of proportion with the sulci which may represent central volume loss versus normal pressure hydrocephalus. Clinical correlation is recommended. Periventricular and deep white matter hypodensities represent chronic microvascular ischemic changes. Small focal left frontal old infarct and encephalomalacia. There is no intracranial hemorrhage. No mass effect or midline shift identified. The visualized paranasal sinuses and mastoid air cells are well aerated. The calvarium is intact. IMPRESSION: No acute intracranial hemorrhage. Age-related atrophy and chronic microvascular ischemic disease. If symptoms persist and  there are no contraindications, MRI may provide better evaluation if clinically indicated. Electronically Signed   By: Elgie Collard M.D.   On: 12/12/2015 21:02   Dg Chest Portable 1 View  12/12/2015  CLINICAL DATA:  Unresponsive hypoglycemic. EXAM: PORTABLE CHEST 1 VIEW COMPARISON:  09/26/2015 FINDINGS: There is no focal parenchymal opacity. There is no pleural effusion or pneumothorax. The heart and mediastinal contours are unremarkable. There is evidence of prior CABG. The osseous structures are unremarkable. IMPRESSION: No active disease. Electronically  Signed   By: Elige Ko   On: 12/12/2015 20:02      Medications:   Impression: Hypoglycemia  Active Problems:   Diabetes (HCC)   Hyperlipidemia   TOBACCO ABUSE   PERIPHERAL VASCULAR DISEASE   Atrial fibrillation (HCC)   Chronic anticoagulation   Chronic combined systolic and diastolic CHF, NYHA class 3 (HCC)   Hypoglycemia   SIRS (systemic inflammatory response syndrome) (HCC)   Hypothermia   Hypotension     Plan: Continue to monitor glucoses every 4 hours we'll determine general insulin needs based on another 24 hours of observation  Consultants:    Procedures   Antibiotics:                   Code Status:  Family Communication:    Disposition Plan see plan above  Time spent: 30 minutes   LOS: 2 days   Jerzie Bieri M   12/14/2015, 6:55 AM

## 2015-12-15 LAB — BASIC METABOLIC PANEL
Anion gap: 7 (ref 5–15)
BUN: 12 mg/dL (ref 6–20)
CHLORIDE: 105 mmol/L (ref 101–111)
CO2: 22 mmol/L (ref 22–32)
CREATININE: 1.12 mg/dL (ref 0.61–1.24)
Calcium: 8.4 mg/dL — ABNORMAL LOW (ref 8.9–10.3)
GFR calc non Af Amer: >60 mL/min (ref >60)
Glucose, Bld: 152 mg/dL — ABNORMAL HIGH (ref 65–99)
POTASSIUM: 4.1 mmol/L (ref 3.5–5.1)
Sodium: 134 mmol/L — ABNORMAL LOW (ref 135–145)

## 2015-12-15 LAB — GLUCOSE, CAPILLARY
GLUCOSE-CAPILLARY: 115 mg/dL — AB (ref 65–99)
GLUCOSE-CAPILLARY: 145 mg/dL — AB (ref 65–99)
GLUCOSE-CAPILLARY: 67 mg/dL (ref 65–99)
Glucose-Capillary: 100 mg/dL — ABNORMAL HIGH (ref 65–99)
Glucose-Capillary: 223 mg/dL — ABNORMAL HIGH (ref 65–99)

## 2015-12-15 LAB — VANCOMYCIN, TROUGH: Vancomycin Tr: 21 ug/mL — ABNORMAL HIGH (ref 10.0–20.0)

## 2015-12-15 MED ORDER — INSULIN ASPART 100 UNIT/ML FLEXPEN: 6.0000 [IU] | PEN_INJECTOR | Freq: Two times a day (BID) | SUBCUTANEOUS | Status: AC

## 2015-12-15 MED ORDER — VANCOMYCIN HCL IN DEXTROSE 750-5 MG/150ML-% IV SOLN
750.0000 mg | Freq: Two times a day (BID) | INTRAVENOUS | Status: DC
  Filled 2015-12-15 (×4): qty 150

## 2015-12-15 MED ORDER — INSULIN DETEMIR 100 UNIT/ML ~~LOC~~ SOLN: 20.0000 [IU] | Freq: Every day | SUBCUTANEOUS | Status: DC

## 2015-12-15 NOTE — Progress Notes (Signed)
Wife arrived to room with patient's clothes and shoes.  Discharge instructions reviewed with patient and his wife, questions answered, understanding verbalized, patient signed forms.  Tech to room to dress patient for discharge home.  Assisted to wheelchair for discharge home in care of family.  [facility] called and informed of patient's discharge.  Stable at discharge.

## 2015-12-15 NOTE — Discharge Summary (Signed)
Physician Discharge Summary  [name] [id number] DOB: October 19, 1946 DOA: 12/12/2015  PCP: Isabella StallingNDIEGO,Nicki Gracy M, MD  Admit date: 12/12/2015 Discharge date: 12/15/2015   Recommendations for Outpatient Follow-up:  Patient is advised to follow-up in my office within one week to assess glycemic control his insulin has been down regulated to Levemir 10 units subcutaneous at bedtime as well as NovoLog flex pen 6 units before meals before breakfast and supper daily Discharge Diagnoses:  Active Problems:   Diabetes (HCC)   Hyperlipidemia   TOBACCO ABUSE   PERIPHERAL VASCULAR DISEASE   Atrial fibrillation (HCC)   Chronic anticoagulation   Chronic combined systolic and diastolic CHF, NYHA class 3 (HCC)   Hypoglycemia   SIRS (systemic inflammatory response syndrome) (HCC)   Hypothermia   Hypotension   Discharge Condition: Good  Filed Weights   12/12/15 1750 12/13/15 0252  Weight: 161 lb (73.029 kg) 156 lb 8.4 oz (71 kg)    History of present illness:  Patient was known insulin-dependent diabetic COPD ischemic cardiomyopathy chronic atrial fibrillation question-history of noncompliance early was admitted with several unresponsive episodes found to be hypoglycemic his insulin was recently increased in office after obtaining a hemoglobin A1c of 11.2 he had recurrent hypoglycemia first 24 hours in hospital his insulin was held just on sliding scale and he subsequently had normal glycemic control for the next 48 hours cultures were negative chest x-ray was negative urinalysis was within normal limits he was empirically placed on thank and Zosyn considering sepsis on admission however there is no focus of infection this was discontinued he was hemodynamically stable in hospital and continue all his previous medicines again with the down regulation of Levemir to 20 units at bedtime NovoLog FlexPen to 6 units before breakfast and 6 units before supper daily  Hospital Course:  See history  of present illness  Procedures:    Consultations:    Discharge Instructions  Discharge Instructions    Discharge instructions    Complete by:  As directed      Discharge patient    Complete by:  As directed             Medication List    STOP taking these medications        isosorbide mononitrate 30 MG 24 hr tablet  Commonly known as:  IMDUR      TAKE these medications        albuterol 108 (90 BASE) MCG/ACT inhaler  Commonly known as:  PROVENTIL HFA;VENTOLIN HFA  Inhale 2 puffs into the lungs every 6 (six) hours as needed for wheezing or shortness of breath.     carvedilol 3.125 MG tablet  Commonly known as:  COREG  Take 1 tablet (3.125 mg total) by mouth 2 (two) times daily.     clopidogrel 75 MG tablet  Commonly known as:  PLAVIX  Take 1 tablet (75 mg total) by mouth daily.     digoxin 0.125 MG tablet  Commonly known as:  LANOXIN  Take 125 mcg by mouth daily.     furosemide 40 MG tablet  Commonly known as:  LASIX  Take 1 tablet (40 mg total) by mouth every other day.     insulin aspart 100 UNIT/ML FlexPen  Commonly known as:  NOVOLOG FLEXPEN  Inject 6 Units into the skin 2 (two) times daily with a meal. 8am, 12pm, and 4pm     insulin detemir 100 UNIT/ML injection  Commonly known as:  LEVEMIR  Inject 0.2 mLs (  20 Units total) into the skin daily.     lisinopril 5 MG tablet  Commonly known as:  PRINIVIL,ZESTRIL  Take 1 tablet (5 mg total) by mouth daily.     nitroGLYCERIN 0.4 MG SL tablet  Commonly known as:  NITROSTAT  Place 1 tablet (0.4 mg total) under the tongue every 5 (five) minutes as needed.     Oxycodone HCl 10 MG Tabs  Take 10 mg by mouth 4 (four) times daily as needed (for pain).     potassium chloride SA 20 MEQ tablet  Commonly known as:  K-DUR,KLOR-CON  TAKE ONE TABLET BY MOUTH TWICE A DAY     PRADAXA 150 MG Caps capsule  Generic drug:  dabigatran  Take 150 mg by mouth 2 (two) times daily.     ranolazine 1000 MG SR tablet    Commonly known as:  RANEXA  Take 1 tablet (1,000 mg total) by mouth 2 (two) times daily.     rosuvastatin 40 MG tablet  Commonly known as:  CRESTOR  Take 40 mg by mouth daily.       No Known Allergies     Follow-up Information    Follow up with [facility].   Contact information:   [address] [place] [address] [phone]        The results of significant diagnostics from this hospitalization (including imaging, microbiology, ancillary and laboratory) are listed below for reference.    Significant Diagnostic Studies: Ct Head Wo Contrast  12/12/2015  CLINICAL DATA:  69 year old male with altered mental status EXAM: CT HEAD WITHOUT CONTRAST TECHNIQUE: Contiguous axial images were obtained from the base of the skull through the vertex without intravenous contrast. COMPARISON:  Head CT dated 03/18/2014 FINDINGS: There is dilatation of the ventricles out of proportion with the sulci which may represent central volume loss versus normal pressure hydrocephalus. Clinical correlation is recommended. Periventricular and deep white matter hypodensities represent chronic microvascular ischemic changes. Small focal left frontal old infarct and encephalomalacia. There is no intracranial hemorrhage. No mass effect or midline shift identified. The visualized paranasal sinuses and mastoid air cells are well aerated. The calvarium is intact. IMPRESSION: No acute intracranial hemorrhage. Age-related atrophy and chronic microvascular ischemic disease. If symptoms persist and there are no contraindications, MRI may provide better evaluation if clinically indicated. Electronically Signed   By: Elgie Collard M.D.   On: 12/12/2015 21:02   Dg Chest Portable 1 View  12/12/2015  CLINICAL DATA:  Unresponsive hypoglycemic. EXAM: PORTABLE CHEST 1 VIEW COMPARISON:  09/26/2015 FINDINGS: There is no focal parenchymal opacity. There is no pleural effusion or pneumothorax. The  heart and mediastinal contours are unremarkable. There is evidence of prior CABG. The osseous structures are unremarkable. IMPRESSION: No active disease. Electronically Signed   By: Elige Ko   On: 12/12/2015 20:02    Microbiology: Recent Results (from the past 240 hour(s))  Culture, blood (routine x 2)     Status: None (Preliminary result)   Collection Time: 12/12/15  6:54 PM  Result Value Ref Range Status   Specimen Description BLOOD RIGHT ARM  Final   Special Requests BOTTLES DRAWN AEROBIC AND ANAEROBIC 3CC EACH  Final   Culture NO GROWTH 3 DAYS  Final   Report Status PENDING  Incomplete  Culture, blood (routine x 2)     Status: None (Preliminary result)   Collection Time: 12/12/15  6:58 PM  Result Value Ref Range Status   Specimen Description BLOOD RIGHT  HAND  Final   Special Requests   Final    BOTTLES DRAWN AEROBIC AND ANAEROBIC AEB=4CC ANA=3CC   Culture NO GROWTH 3 DAYS  Final   Report Status PENDING  Incomplete  MRSA PCR Screening     Status: None   Collection Time: 12/13/15  3:00 AM  Result Value Ref Range Status   MRSA by PCR NEGATIVE NEGATIVE Final    Comment:        The GeneXpert MRSA Assay (FDA approved for NASAL specimens only), is one component of a comprehensive MRSA colonization surveillance program. It is not intended to diagnose MRSA infection nor to guide or monitor treatment for MRSA infections.   C difficile quick scan w PCR reflex     Status: None   Collection Time: 12/14/15 11:15 AM  Result Value Ref Range Status   C Diff antigen NEGATIVE NEGATIVE Final   C Diff toxin NEGATIVE NEGATIVE Final   C Diff interpretation Negative for toxigenic C. difficile  Final     Labs: Basic Metabolic Panel:  Recent Labs Lab 12/12/15 1854 12/13/15 0437 12/14/15 0555 12/15/15 0636  NA 134* 133* 132* 134*  K 4.8 4.8 4.6 4.1  CL 106 106 102 105  CO2 22 19* 22 22  GLUCOSE 50* 162* 105* 152*  BUN 32* 25* 15 12  CREATININE 1.30* 1.31* 1.12 1.12  CALCIUM  8.6* 7.8* 8.4* 8.4*   Liver Function Tests:  Recent Labs Lab 12/12/15 1854  AST 46*  ALT 22  ALKPHOS 69  BILITOT 0.6  PROT 7.2  ALBUMIN 3.9   No results for input(s): LIPASE, AMYLASE in the last 168 hours. No results for input(s): AMMONIA in the last 168 hours. CBC:  Recent Labs Lab 12/12/15 1854 12/13/15 0437 12/14/15 0748  WBC 13.5* 11.0* 11.4*  NEUTROABS 11.7*  --  8.5*  HGB 14.7 12.1* 13.0  HCT 43.7 36.4* 37.6*  MCV 95.6 95.8 94.0  PLT 227 197 197   Cardiac Enzymes:  Recent Labs Lab 12/12/15 1854  TROPONINI <0.03   BNP: BNP (last 3 results)  Recent Labs  06/10/15 0616 08/08/15 1836 09/02/15 2334  BNP 174.0* 238.0* 83.0    ProBNP (last 3 results) No results for input(s): PROBNP in the last 8760 hours.  CBG:  Recent Labs Lab 12/14/15 2005 12/15/15 0003 12/15/15 0057 12/15/15 0403 12/15/15 0752  GLUCAP 205* 67 100* 115* 145*       Signed:  Venesa Semidey M  [facility] Pager: [phone] 12/15/2015, 11:32 AM

## 2015-12-15 NOTE — Progress Notes (Signed)
ANTIBIOTIC CONSULT NOTE-follow up  Pharmacy Consult for Vancomycin and Zosyn Indication: rule out sepsis  No Known Allergies  Patient Measurements: Height:  (175.3 cm) Weight: 156 lb 8.4 oz (71 kg) IBW/kg (Calculated) : 70.7  Vital Signs: Temp: 97.9 F (36.6 C) (12/24 0403) Temp Source: Oral (12/24 0403) BP: 124/44 mmHg (12/24 0403) Pulse Rate: 72 (12/24 0403)  Labs:  Recent Labs  12/12/15 1854 12/13/15 0437 12/14/15 0555 12/14/15 0748 12/15/15 0636  WBC 13.5* 11.0*  --  11.4*  --   HGB 14.7 12.1*  --  13.0  --   PLT 227 197  --  197  --   CREATININE 1.30* 1.31* 1.12  --  1.12    Estimated Creatinine Clearance: 63.1 mL/min (by C-G formula based on Cr of 1.12).   Recent Labs  12/15/15 0636  VANCOTROUGH 21*     Microbiology: Recent Results (from the past 720 hour(s))  Culture, blood (routine x 2)     Status: None (Preliminary result)   Collection Time: 12/12/15  6:54 PM  Result Value Ref Range Status   Specimen Description BLOOD RIGHT ARM  Final   Special Requests BOTTLES DRAWN AEROBIC AND ANAEROBIC 3CC EACH  Final   Culture NO GROWTH 3 DAYS  Final   Report Status PENDING  Incomplete  Culture, blood (routine x 2)     Status: None (Preliminary result)   Collection Time: 12/12/15  6:58 PM  Result Value Ref Range Status   Specimen Description BLOOD RIGHT HAND  Final   Special Requests   Final    BOTTLES DRAWN AEROBIC AND ANAEROBIC AEB=4CC ANA=3CC   Culture NO GROWTH 3 DAYS  Final   Report Status PENDING  Incomplete  MRSA PCR Screening     Status: None   Collection Time: 12/13/15  3:00 AM  Result Value Ref Range Status   MRSA by PCR NEGATIVE NEGATIVE Final    Comment:        The GeneXpert MRSA Assay (FDA approved for NASAL specimens only), is one component of a comprehensive MRSA colonization surveillance program. It is not intended to diagnose MRSA infection nor to guide or monitor treatment for MRSA infections.   C difficile quick scan w PCR  reflex     Status: None   Collection Time: 12/14/15 11:15 AM  Result Value Ref Range Status   C Diff antigen NEGATIVE NEGATIVE Final   C Diff toxin NEGATIVE NEGATIVE Final   C Diff interpretation Negative for toxigenic C. difficile  Final    Medical History: Past Medical History  Diagnosis Date  . Coronary atherosclerosis of native coronary artery     Ant. MI in 4/95; PTCA of 90% prox & distal LAD unsuccessful-->  Urgent CABG-4/95 TO Cx; 50% RCA; ant. HK; EF of 50-55%; 08/2008: 3-V disease plus a  95% LIMA stenosis, treated with DES; occlusion of SVG to CX; RCA SVG stenosis--> PCI with DES  2010: in-stent restenosis in the LIMA-->cutting balloon; EF of 35-40%;  07/2010: TO of SVG to RCA-medical therapy advised  . Atrial fibrillation (HCC)     1995, 2009; bradycardia with beta blocker therapy  . Peripheral vascular disease (HCC) 1995    Abdominal aortic obstruction 1995-->vascular surgery  . Hyperlipidemia   . Type 2 diabetes mellitus (HCC)   . DDD (degenerative disc disease)     Discectomy and fusion at L4 and L5  . History of alcohol abuse     Quit in 1999  . Chronic systolic heart  failure (HCC)   . Non-compliance   . Ischemic cardiomyopathy     LVEF 40-45% August 2016   Anti-infectives    Start     Dose/Rate Route Frequency Ordered Stop   12/16/15 0000  vancomycin (VANCOCIN) IVPB 750 mg/150 ml premix     750 mg 150 mL/hr over 60 Minutes Intravenous Every 12 hours 12/15/15 0902     12/13/15 0600  piperacillin-tazobactam (ZOSYN) IVPB 3.375 g     3.375 g 12.5 mL/hr over 240 Minutes Intravenous Every 8 hours 12/13/15 0332     12/13/15 0600  vancomycin (VANCOCIN) IVPB 1000 mg/200 mL premix  Status:  Discontinued     1,000 mg 200 mL/hr over 60 Minutes Intravenous Every 12 hours 12/13/15 0332 12/15/15 0902   12/12/15 2200  piperacillin-tazobactam (ZOSYN) IVPB 3.375 g     3.375 g 100 mL/hr over 30 Minutes Intravenous  Once 12/12/15 2145 12/12/15 2236   12/12/15 2200  vancomycin  (VANCOCIN) IVPB 1000 mg/200 mL premix     1,000 mg 200 mL/hr over 60 Minutes Intravenous  Once 12/12/15 2145 12/13/15 0011    Vancomycin 12/1 >> Zosyn 12/21 >>  Assessment: 69yo male, asked to initiate Vancomycin and Zosyn for suspected sepsis.  Estimated Creatinine Clearance: 63.1 mL/min (by C-G formula based on Cr of 1.12).  Vancomycin trough slightly above goal this AM  Goal of Therapy:  Vancomycin trough level 15-20 mcg/ml Eradicate infection.  Plan: Continue Zosyn 3.375gm IV q8h, EID Change Vancomycin to 750 mg IV every 12 hours Check trough level at steady state Monitor labs, renal fxn and cultures / progress Deescalate ABX when improved / appropriate  Josephine Igoittman, Adelayde Minney Bennett, Str[facility] Gi Endoscopy CenterRPH 12/15/2015,9:02 AM

## 2015-12-15 NOTE — Progress Notes (Signed)
Central telemetry notified of discharge, telemetry removed.  Attempting to call family for transportation.  IV access removed for discharge.

## 2015-12-15 NOTE — Progress Notes (Signed)
Wife returned phone call, stated she would have to arrange transportation to pick up patient.  Will review discharge instructions with patient and wife when she arrives.

## 2015-12-17 LAB — CULTURE, BLOOD (ROUTINE X 2)
CULTURE: NO GROWTH
CULTURE: NO GROWTH

## 2015-12-22 ENCOUNTER — Other Ambulatory Visit: Payer: Self-pay | Admitting: Cardiology

## 2016-02-19 ENCOUNTER — Other Ambulatory Visit: Payer: Self-pay | Admitting: Cardiology

## 2016-02-20 ENCOUNTER — Ambulatory Visit (INDEPENDENT_AMBULATORY_CARE_PROVIDER_SITE_OTHER): Payer: Medicare Other | Admitting: Cardiology

## 2016-02-20 ENCOUNTER — Encounter: Payer: Self-pay | Admitting: Cardiology

## 2016-02-20 VITALS — BP 118/64 | HR 93 | Ht 69.0 in | Wt 150.0 lb

## 2016-02-20 DIAGNOSIS — I4891 Unspecified atrial fibrillation: Secondary | ICD-10-CM | POA: Diagnosis not present

## 2016-02-20 DIAGNOSIS — E785 Hyperlipidemia, unspecified: Secondary | ICD-10-CM

## 2016-02-20 DIAGNOSIS — I5022 Chronic systolic (congestive) heart failure: Secondary | ICD-10-CM | POA: Diagnosis not present

## 2016-02-20 DIAGNOSIS — I2510 Atherosclerotic heart disease of native coronary artery without angina pectoris: Secondary | ICD-10-CM

## 2016-02-20 NOTE — Progress Notes (Signed)
Patient ID: SHADRACH BARTUNEK, male   DOB: 1946/05/06, 70 y.o.   MRN: [id number]     Clinical Summary Mr. Shipper is a 70 y.o.male seen today for follow up of the following medical problems.  1. Chronic systolic HF - LVEF 35-40% by echo 04/2014 - echo 07/2015 LVEF 40-45%   - denies any SOB or DOE. No recent LE edema - compliant with meds. Denies any lightheadness or dizzines, he has previouly had troubles with orthostatic dizziness and falls.   2. CAD -cath 11/2013 with severe 3 vessel CAD, SVG x2 chronically occluded, and patent LIMA-LAD. Overall stable unchanged coronary anatomy. Previous caths describe anatomy not amenable to revascularization.  - denies any chest pain since last visit.    3. Afib - denies any palpitations. No bleeding problems on pradaxa  4. Hyperlipidemia - compliant with statin Past Medical History  Diagnosis Date  . Coronary atherosclerosis of native coronary artery     Ant. MI in 4/95; PTCA of 90% prox & distal LAD unsuccessful-->  Urgent CABG-4/95 TO Cx; 50% RCA; ant. HK; EF of 50-55%; 08/2008: 3-V disease plus a  95% LIMA stenosis, treated with DES; occlusion of SVG to CX; RCA SVG stenosis--> PCI with DES  2010: in-stent restenosis in the LIMA-->cutting balloon; EF of 35-40%;  07/2010: TO of SVG to RCA-medical therapy advised  . Atrial fibrillation (HCC)     1995, 2009; bradycardia with beta blocker therapy  . Peripheral vascular disease (HCC) 1995    Abdominal aortic obstruction 1995-->vascular surgery  . Hyperlipidemia   . Type 2 diabetes mellitus (HCC)   . DDD (degenerative disc disease)     Discectomy and fusion at L4 and L5  . History of alcohol abuse     Quit in 1999  . Chronic systolic heart failure (HCC)   . Non-compliance   . Ischemic cardiomyopathy     LVEF 40-45% August 2016     No Known Allergies   Current Outpatient Prescriptions  Medication Sig Dispense Refill  . albuterol (PROVENTIL HFA;VENTOLIN HFA) 108 (90 BASE) MCG/ACT inhaler  Inhale 2 puffs into the lungs every 6 (six) hours as needed for wheezing or shortness of breath.    . carvedilol (COREG) 3.125 MG tablet Take 1 tablet (3.125 mg total) by mouth 2 (two) times daily. 60 tablet 1  . clopidogrel (PLAVIX) 75 MG tablet TAKE ONE (1) TABLET EACH DAY 30 tablet 11  . dabigatran (PRADAXA) 150 MG CAPS capsule Take 150 mg by mouth 2 (two) times daily.    . digoxin (LANOXIN) 0.125 MG tablet Take 125 mcg by mouth daily.      . furosemide (LASIX) 40 MG tablet Take 1 tablet (40 mg total) by mouth every other day. 30 tablet 6  . furosemide (LASIX) 40 MG tablet TAKE ONE TABLET EVERY OTHER DAY 30 tablet 3  . insulin aspart (NOVOLOG FLEXPEN) 100 UNIT/ML FlexPen Inject 6 Units into the skin 2 (two) times daily with a meal. 8am, 12pm, and 4pm 15 mL 11  . insulin detemir (LEVEMIR) 100 UNIT/ML injection Inject 0.2 mLs (20 Units total) into the skin daily. 10 mL 11  . lisinopril (PRINIVIL,ZESTRIL) 5 MG tablet Take 1 tablet (5 mg total) by mouth daily. 90 tablet 3  . nitroGLYCERIN (NITROSTAT) 0.4 MG SL tablet Place 1 tablet (0.4 mg total) under the tongue every 5 (five) minutes as needed. (Patient taking differently: Place 0.4 mg under the tongue every 5 (five) minutes as needed for chest pain. ) 25  tablet 2  . Oxycodone HCl 10 MG TABS Take 10 mg by mouth 4 (four) times daily as needed (for pain).    . potassium chloride SA (K-DUR,KLOR-CON) 20 MEQ tablet TAKE ONE TABLET BY MOUTH TWICE A DAY 60 tablet 3  . ranolazine (RANEXA) 1000 MG SR tablet Take 1 tablet (1,000 mg total) by mouth 2 (two) times daily. 60 tablet 5  . rosuvastatin (CRESTOR) 40 MG tablet Take 40 mg by mouth daily.     No current facility-administered medications for this visit.     Past Surgical History  Procedure Laterality Date  . Cholecystectomy  1995  . Lumbar fusion      +Discectomy;x2; L4 and L5  . Aorto-femoral bypass graft  1995  . Left heart catheterization with coronary angiogram N/A 12/14/2013    Procedure:  LEFT HEART CATHETERIZATION WITH CORONARY ANGIOGRAM;  Surgeon: Micheline Chapman, MD;  Location: [facility] CATH LAB;  Service: Cardiovascular;  Laterality: N/A;  . Back surgery       No Known Allergies    Family History  Problem Relation Age of Onset  . CAD Father      Social History Mr. Strength reports that he has been smoking Cigarettes.  He has a 20 pack-year smoking history. He uses smokeless tobacco. Mr. Hayashida reports that he does not drink alcohol.   Review of Systems CONSTITUTIONAL: No weight loss, fever, chills, weakness or fatigue.  HEENT: Eyes: No visual loss, blurred vision, double vision or yellow sclerae.No hearing loss, sneezing, congestion, runny nose or sore throat.  SKIN: No rash or itching.  CARDIOVASCULAR: per HPI RESPIRATORY: No shortness of breath, cough or sputum.  GASTROINTESTINAL: No anorexia, nausea, vomiting or diarrhea. No abdominal pain or blood.  GENITOURINARY: No burning on urination, no polyuria NEUROLOGICAL: No headache, dizziness, syncope, paralysis, ataxia, numbness or tingling in the extremities. No change in bowel or bladder control.  MUSCULOSKELETAL: No muscle, back pain, joint pain or stiffness.  LYMPHATICS: No enlarged nodes. No history of splenectomy.  PSYCHIATRIC: No history of depression or anxiety.  ENDOCRINOLOGIC: No reports of sweating, cold or heat intolerance. No polyuria or polydipsia.  Marland Kitchen   Physical Examination Filed Vitals:   02/20/16 1419  BP: 118/64  Pulse: 93   Filed Vitals:   02/20/16 1419  Height:  (1.753 m)  Weight: 150 lb (68.04 kg)    Gen: resting comfortably, no acute distress HEENT: no scleral icterus, pupils equal round and reactive, no palptable cervical adenopathy,  CV: RRR, no m/r/g, no jvd Resp: Clear to auscultation bilaterally GI: abdomen is soft, non-tender, non-distended, normal bowel sounds, no hepatosplenomegaly MSK: extremities are warm, no edema.  Skin: warm, no rash Neuro:  no focal  deficits Psych: appropriate affect   Diagnostic Studies  04/2014 echo Study Conclusions  - Study data: Technically difficult study. - Left ventricle: The cavity size was normal. Wall thickness was increased increased in a pattern of mild to moderate LVH. Systolic function was moderately to severely reduced. The estimated ejection fraction was in the range of 35% to 40%. Compared to prior study 09/07/13 LVEF has improved. - Aortic valve: Mildly calcified annulus. Trileaflet; mildly thickened leaflets. Valve area: 1.65cm^2(VTI). Valve area: 1.76cm^2 (Vmax). - Mitral valve: Mildly calcified annulus. Mildly thickened leaflets . Mild regurgitation. - Left atrium: The atrium was severely dilated. - Right ventricle: The cavity size was mildly dilated. - Right atrium: The atrium was severely dilated.    Assessment and Plan   1. Chronic systolic HF -appears  euvolemic in clinic, no current symptoms - continue current meds. Limited titration due to soft bp's and previous orthostatic symptoms including syncope.   2. CAD - significant CAD from last cath with no targets for revasc - no recent chest pain, continue current meds  3. Afib - no current symptoms, we will continue current meds  4. Hyperlipidemia - continue statin, request labs from pcp   F/u 6 months  Antoine Poche, M.D.

## 2016-02-20 NOTE — Patient Instructions (Signed)
Your physician wants you to follow-up in: 6 Months with Dr. Branch. You will receive a reminder letter in the mail two months in advance. If you don't receive a letter, please call our office to schedule the follow-up appointment.  Your physician recommends that you continue on your current medications as directed. Please refer to the Current Medication list given to you today.  If you need a refill on your cardiac medications before your next appointment, please call your pharmacy.  Thank you for choosing  [facility]!   

## 2016-04-18 ENCOUNTER — Inpatient Hospital Stay (HOSPITAL_COMMUNITY): Payer: Medicare Other

## 2016-04-18 ENCOUNTER — Encounter (HOSPITAL_COMMUNITY): Payer: Self-pay | Admitting: Emergency Medicine

## 2016-04-18 ENCOUNTER — Emergency Department (HOSPITAL_COMMUNITY): Payer: Medicare Other

## 2016-04-18 ENCOUNTER — Inpatient Hospital Stay (HOSPITAL_COMMUNITY)
Admission: EM | Admit: 2016-04-18 | Discharge: 2016-04-23 | DRG: 637 | Disposition: A | Discharge status: Skilled Nursing Facility | Payer: Medicare Other | Attending: Family Medicine | Admitting: Family Medicine

## 2016-04-18 DIAGNOSIS — Z8249 Family history of ischemic heart disease and other diseases of the circulatory system: Secondary | ICD-10-CM

## 2016-04-18 DIAGNOSIS — L03032 Cellulitis of left toe: Secondary | ICD-10-CM

## 2016-04-18 DIAGNOSIS — Z01810 Encounter for preprocedural cardiovascular examination: Secondary | ICD-10-CM | POA: Diagnosis not present

## 2016-04-18 DIAGNOSIS — I2510 Atherosclerotic heart disease of native coronary artery without angina pectoris: Secondary | ICD-10-CM | POA: Diagnosis present

## 2016-04-18 DIAGNOSIS — E119 Type 2 diabetes mellitus without complications: Secondary | ICD-10-CM

## 2016-04-18 DIAGNOSIS — R319 Hematuria, unspecified: Secondary | ICD-10-CM | POA: Diagnosis present

## 2016-04-18 DIAGNOSIS — Z7901 Long term (current) use of anticoagulants: Secondary | ICD-10-CM | POA: Diagnosis not present

## 2016-04-18 DIAGNOSIS — E0821 Diabetes mellitus due to underlying condition with diabetic nephropathy: Secondary | ICD-10-CM

## 2016-04-18 DIAGNOSIS — F17210 Nicotine dependence, cigarettes, uncomplicated: Secondary | ICD-10-CM | POA: Diagnosis present

## 2016-04-18 DIAGNOSIS — L8990 Pressure ulcer of unspecified site, unspecified stage: Secondary | ICD-10-CM | POA: Insufficient documentation

## 2016-04-18 DIAGNOSIS — Z794 Long term (current) use of insulin: Secondary | ICD-10-CM | POA: Diagnosis not present

## 2016-04-18 DIAGNOSIS — I255 Ischemic cardiomyopathy: Secondary | ICD-10-CM | POA: Diagnosis present

## 2016-04-18 DIAGNOSIS — M869 Osteomyelitis, unspecified: Secondary | ICD-10-CM | POA: Diagnosis present

## 2016-04-18 DIAGNOSIS — Z951 Presence of aortocoronary bypass graft: Secondary | ICD-10-CM

## 2016-04-18 DIAGNOSIS — Z955 Presence of coronary angioplasty implant and graft: Secondary | ICD-10-CM | POA: Diagnosis not present

## 2016-04-18 DIAGNOSIS — L8993 Pressure ulcer of unspecified site, stage 3: Secondary | ICD-10-CM | POA: Diagnosis present

## 2016-04-18 DIAGNOSIS — E785 Hyperlipidemia, unspecified: Secondary | ICD-10-CM | POA: Diagnosis present

## 2016-04-18 DIAGNOSIS — E11621 Type 2 diabetes mellitus with foot ulcer: Secondary | ICD-10-CM | POA: Diagnosis present

## 2016-04-18 DIAGNOSIS — M8610 Other acute osteomyelitis, unspecified site: Secondary | ICD-10-CM

## 2016-04-18 DIAGNOSIS — I5042 Chronic combined systolic (congestive) and diastolic (congestive) heart failure: Secondary | ICD-10-CM | POA: Diagnosis present

## 2016-04-18 DIAGNOSIS — Z01818 Encounter for other preprocedural examination: Secondary | ICD-10-CM

## 2016-04-18 DIAGNOSIS — R52 Pain, unspecified: Secondary | ICD-10-CM

## 2016-04-18 DIAGNOSIS — I4891 Unspecified atrial fibrillation: Secondary | ICD-10-CM | POA: Diagnosis present

## 2016-04-18 DIAGNOSIS — I739 Peripheral vascular disease, unspecified: Secondary | ICD-10-CM | POA: Diagnosis present

## 2016-04-18 DIAGNOSIS — L03116 Cellulitis of left lower limb: Secondary | ICD-10-CM | POA: Diagnosis present

## 2016-04-18 DIAGNOSIS — M86272 Subacute osteomyelitis, left ankle and foot: Secondary | ICD-10-CM | POA: Diagnosis not present

## 2016-04-18 DIAGNOSIS — Z01811 Encounter for preprocedural respiratory examination: Secondary | ICD-10-CM

## 2016-04-18 DIAGNOSIS — M86172 Other acute osteomyelitis, left ankle and foot: Secondary | ICD-10-CM

## 2016-04-18 DIAGNOSIS — E11622 Type 2 diabetes mellitus with other skin ulcer: Secondary | ICD-10-CM | POA: Diagnosis present

## 2016-04-18 DIAGNOSIS — I5023 Acute on chronic systolic (congestive) heart failure: Secondary | ICD-10-CM

## 2016-04-18 DIAGNOSIS — E1169 Type 2 diabetes mellitus with other specified complication: Secondary | ICD-10-CM | POA: Diagnosis present

## 2016-04-18 DIAGNOSIS — N179 Acute kidney failure, unspecified: Secondary | ICD-10-CM | POA: Diagnosis present

## 2016-04-18 DIAGNOSIS — L0390 Cellulitis, unspecified: Secondary | ICD-10-CM | POA: Diagnosis present

## 2016-04-18 DIAGNOSIS — E860 Dehydration: Secondary | ICD-10-CM | POA: Diagnosis present

## 2016-04-18 DIAGNOSIS — F0390 Unspecified dementia without behavioral disturbance: Secondary | ICD-10-CM | POA: Diagnosis present

## 2016-04-18 DIAGNOSIS — I482 Chronic atrial fibrillation: Secondary | ICD-10-CM | POA: Diagnosis present

## 2016-04-18 DIAGNOSIS — L97329 Non-pressure chronic ulcer of left ankle with unspecified severity: Secondary | ICD-10-CM | POA: Diagnosis present

## 2016-04-18 DIAGNOSIS — Z9049 Acquired absence of other specified parts of digestive tract: Secondary | ICD-10-CM | POA: Diagnosis not present

## 2016-04-18 DIAGNOSIS — R4182 Altered mental status, unspecified: Secondary | ICD-10-CM | POA: Diagnosis present

## 2016-04-18 DIAGNOSIS — Z95828 Presence of other vascular implants and grafts: Secondary | ICD-10-CM

## 2016-04-18 DIAGNOSIS — I25119 Atherosclerotic heart disease of native coronary artery with unspecified angina pectoris: Secondary | ICD-10-CM | POA: Diagnosis not present

## 2016-04-18 DIAGNOSIS — I480 Paroxysmal atrial fibrillation: Secondary | ICD-10-CM

## 2016-04-18 LAB — HEPATIC FUNCTION PANEL
ALT: 29 U/L (ref 17–63)
AST: 93 U/L — AB (ref 15–41)
Albumin: 3.9 g/dL (ref 3.5–5.0)
Alkaline Phosphatase: 84 U/L (ref 38–126)
BILIRUBIN DIRECT: 0.1 mg/dL (ref 0.1–0.5)
Indirect Bilirubin: 0.6 mg/dL (ref 0.3–0.9)
Total Bilirubin: 0.7 mg/dL (ref 0.3–1.2)
Total Protein: 8.3 g/dL — ABNORMAL HIGH (ref 6.5–8.1)

## 2016-04-18 LAB — URINE MICROSCOPIC-ADD ON
SQUAMOUS EPITHELIAL / LPF: NONE SEEN
WBC UA: NONE SEEN WBC/hpf (ref 0–5)

## 2016-04-18 LAB — CBC WITH DIFFERENTIAL/PLATELET
BASOS ABS: 0 10*3/uL (ref 0.0–0.1)
Basophils Relative: 0 %
Eosinophils Absolute: 0 10*3/uL (ref 0.0–0.7)
Eosinophils Relative: 0 %
HCT: 45.5 % (ref 39.0–52.0)
HEMOGLOBIN: 15.6 g/dL (ref 13.0–17.0)
LYMPHS ABS: 1.3 10*3/uL (ref 0.7–4.0)
LYMPHS PCT: 7 %
MCH: 31.5 pg (ref 26.0–34.0)
MCHC: 34.3 g/dL (ref 30.0–36.0)
MCV: 91.9 fL (ref 78.0–100.0)
Monocytes Absolute: 1.6 10*3/uL — ABNORMAL HIGH (ref 0.1–1.0)
Monocytes Relative: 9 %
NEUTROS ABS: 15.8 10*3/uL — AB (ref 1.7–7.7)
NEUTROS PCT: 84 %
PLATELETS: 211 10*3/uL (ref 150–400)
RBC: 4.95 MIL/uL (ref 4.22–5.81)
RDW: 13.5 % (ref 11.5–15.5)
WBC: 18.7 10*3/uL — AB (ref 4.0–10.5)

## 2016-04-18 LAB — BASIC METABOLIC PANEL
ANION GAP: 11 (ref 5–15)
BUN: 28 mg/dL — ABNORMAL HIGH (ref 6–20)
CHLORIDE: 104 mmol/L (ref 101–111)
CO2: 21 mmol/L — AB (ref 22–32)
Calcium: 9.8 mg/dL (ref 8.9–10.3)
Creatinine, Ser: 1.58 mg/dL — ABNORMAL HIGH (ref 0.61–1.24)
GFR calc Af Amer: 50 mL/min — ABNORMAL LOW (ref >60)
GFR, EST NON AFRICAN AMERICAN: 43 mL/min — AB (ref >60)
GLUCOSE: 115 mg/dL — AB (ref 65–99)
POTASSIUM: 5 mmol/L (ref 3.5–5.1)
Sodium: 136 mmol/L (ref 135–145)

## 2016-04-18 LAB — URINALYSIS, ROUTINE W REFLEX MICROSCOPIC
Bilirubin Urine: NEGATIVE
GLUCOSE, UA: NEGATIVE mg/dL
Ketones, ur: NEGATIVE mg/dL
LEUKOCYTES UA: NEGATIVE
Nitrite: NEGATIVE
PROTEIN: 100 mg/dL — AB
pH: 5.5 (ref 5.0–8.0)

## 2016-04-18 LAB — DIGOXIN LEVEL: Digoxin Level: 1.4 ng/mL (ref 0.8–2.0)

## 2016-04-18 LAB — BRAIN NATRIURETIC PEPTIDE: B Natriuretic Peptide: 233 pg/mL — ABNORMAL HIGH (ref 0.0–100.0)

## 2016-04-18 MED ORDER — DIGOXIN 125 MCG PO TABS
125.0000 ug | ORAL_TABLET | Freq: Every day | ORAL | Status: DC
  Administered 2016-04-19 – 2016-04-23 (×5): 125 ug via ORAL
  Filled 2016-04-18 (×5): qty 1

## 2016-04-18 MED ORDER — ROSUVASTATIN CALCIUM 20 MG PO TABS
40.0000 mg | ORAL_TABLET | Freq: Every day | ORAL | Status: DC
  Administered 2016-04-19 – 2016-04-23 (×5): 40 mg via ORAL
  Filled 2016-04-18: qty 2
  Filled 2016-04-18: qty 4
  Filled 2016-04-18 (×7): qty 2

## 2016-04-18 MED ORDER — DABIGATRAN ETEXILATE MESYLATE 150 MG PO CAPS
150.0000 mg | ORAL_CAPSULE | Freq: Two times a day (BID) | ORAL | Status: DC
  Administered 2016-04-18 – 2016-04-21 (×6): 150 mg via ORAL
  Filled 2016-04-18 (×10): qty 1

## 2016-04-18 MED ORDER — CLOPIDOGREL BISULFATE 75 MG PO TABS
75.0000 mg | ORAL_TABLET | Freq: Every day | ORAL | Status: DC
  Administered 2016-04-19 – 2016-04-21 (×3): 75 mg via ORAL
  Filled 2016-04-18 (×3): qty 1

## 2016-04-18 MED ORDER — VANCOMYCIN HCL IN DEXTROSE 1-5 GM/200ML-% IV SOLN
1000.0000 mg | Freq: Once | INTRAVENOUS | Status: AC
  Administered 2016-04-18: 1000 mg via INTRAVENOUS
  Filled 2016-04-18: qty 200

## 2016-04-18 MED ORDER — SODIUM CHLORIDE 0.9 % IV BOLUS (SEPSIS)
1000.0000 mL | Freq: Once | INTRAVENOUS | Status: AC
  Administered 2016-04-18: 1000 mL via INTRAVENOUS

## 2016-04-18 MED ORDER — ALBUTEROL SULFATE (2.5 MG/3ML) 0.083% IN NEBU: 2.5000 mg | INHALATION_SOLUTION | Freq: Four times a day (QID) | RESPIRATORY_TRACT | Status: DC | PRN

## 2016-04-18 MED ORDER — VANCOMYCIN HCL IN DEXTROSE 750-5 MG/150ML-% IV SOLN
750.0000 mg | Freq: Two times a day (BID) | INTRAVENOUS | Status: DC
  Filled 2016-04-18 (×6): qty 150

## 2016-04-18 MED ORDER — INSULIN ASPART 100 UNIT/ML ~~LOC~~ SOLN
0.0000 [IU] | Freq: Three times a day (TID) | SUBCUTANEOUS | Status: DC
Start: 2016-04-18 — End: 2016-04-23
  Administered 2016-04-19 (×2): 5 [IU] via SUBCUTANEOUS
  Administered 2016-04-19: 8 [IU] via SUBCUTANEOUS
  Administered 2016-04-20: 5 [IU] via SUBCUTANEOUS
  Administered 2016-04-20 (×2): 3 [IU] via SUBCUTANEOUS
  Administered 2016-04-21: 2 [IU] via SUBCUTANEOUS
  Administered 2016-04-21: 3 [IU] via SUBCUTANEOUS
  Administered 2016-04-22: 5 [IU] via SUBCUTANEOUS
  Administered 2016-04-22: 8 [IU] via SUBCUTANEOUS
  Administered 2016-04-23: 5 [IU] via SUBCUTANEOUS
  Administered 2016-04-23: 3 [IU] via SUBCUTANEOUS
  Administered 2016-04-23: 8 [IU] via SUBCUTANEOUS

## 2016-04-18 MED ORDER — VANCOMYCIN HCL 500 MG IV SOLR
500.0000 mg | Freq: Two times a day (BID) | INTRAVENOUS | Status: DC
  Administered 2016-04-19 – 2016-04-21 (×5): 500 mg via INTRAVENOUS
  Filled 2016-04-18 (×6): qty 500

## 2016-04-18 MED ORDER — CEFTRIAXONE SODIUM 1 G IJ SOLR: 1.0000 g | Freq: Once | INTRAMUSCULAR | Status: DC

## 2016-04-18 MED ORDER — CARVEDILOL 3.125 MG PO TABS
3.1250 mg | ORAL_TABLET | Freq: Two times a day (BID) | ORAL | Status: DC
  Administered 2016-04-19 – 2016-04-23 (×8): 3.125 mg via ORAL
  Filled 2016-04-18 (×9): qty 1

## 2016-04-18 MED ORDER — RANOLAZINE ER 500 MG PO TB12
1000.0000 mg | ORAL_TABLET | Freq: Two times a day (BID) | ORAL | Status: DC
  Administered 2016-04-19 – 2016-04-23 (×9): 1000 mg via ORAL
  Filled 2016-04-18 (×9): qty 2

## 2016-04-18 MED ORDER — ALBUTEROL SULFATE HFA 108 (90 BASE) MCG/ACT IN AERS
2.0000 | INHALATION_SPRAY | Freq: Four times a day (QID) | RESPIRATORY_TRACT | Status: DC | PRN
  Filled 2016-04-18: qty 6.7

## 2016-04-18 MED ORDER — SODIUM CHLORIDE 0.9 % IV BOLUS (SEPSIS): 1000.0000 mL | Freq: Once | INTRAVENOUS | Status: DC

## 2016-04-18 NOTE — H&P (Signed)
History and Physical    [name]:[id number] DOB: 1946/08/01 DOA: 04/18/2016  Referring MD/NP/PA: Agapito Games PCP: [name], MD  Outpatient Specialists: Patient coming from: Home  Chief Complaint: Mental Status Change  HPI: [name] is a 70 y.o. male with medical history significant of reportedly increased confusion at home and not following commands. Pt subsequently presented to the ED for further work up.  ED Course: In the ED, pt noted to have increased WBC of over 18k with LLE swelling. Xray of the LLE demonstrated soft tissue ulceration and osteomyelitis of underlying distal fibula at the lateral malleolus. Vanc was ordered in the ED. Hospitalist consulted for consideration for admission.  Review of Systems:  Review of Systems  Unable to perform ROS: dementia     Past Medical History  Diagnosis Date  . Coronary atherosclerosis of native coronary artery     Ant. MI in 4/95; PTCA of 90% prox & distal LAD unsuccessful-->  Urgent CABG-4/95 TO Cx; 50% RCA; ant. HK; EF of 50-55%; 08/2008: 3-V disease plus a  95% LIMA stenosis, treated with DES; occlusion of SVG to CX; RCA SVG stenosis--> PCI with DES  2010: in-stent restenosis in the LIMA-->cutting balloon; EF of 35-40%;  07/2010: TO of SVG to RCA-medical therapy advised  . Atrial fibrillation (HCC)     1995, 2009; bradycardia with beta blocker therapy  . Peripheral vascular disease (HCC) 1995    Abdominal aortic obstruction 1995-->vascular surgery  . Hyperlipidemia   . Type 2 diabetes mellitus (HCC)   . DDD (degenerative disc disease)     Discectomy and fusion at L4 and L5  . History of alcohol abuse     Quit in 1999  . Chronic systolic heart failure (HCC)   . Non-compliance   . Ischemic cardiomyopathy     LVEF 40-45% August 2016    Past Surgical History  Procedure Laterality Date  . Cholecystectomy  1995  . Lumbar fusion      +Discectomy;x2; L4 and L5  . Aorto-femoral bypass graft  1995  . Left  heart catheterization with coronary angiogram N/A 12/14/2013    Procedure: LEFT HEART CATHETERIZATION WITH CORONARY ANGIOGRAM;  Surgeon: Micheline Chapman, MD;  Location: [facility] CATH LAB;  Service: Cardiovascular;  Laterality: N/A;  . Back surgery       reports that he has been smoking Cigarettes.  He has a 20 pack-year smoking history. He uses smokeless tobacco. He reports that he does not drink alcohol or use illicit drugs.  No Known Allergies  Family History  Problem Relation Age of Onset  . CAD Father    Unacceptable: Noncontributory, unremarkable, or negative. Acceptable: Family history reviewed and not pertinent (If you reviewed it)  Prior to Admission medications   Medication Sig Start Date End Date Taking? Authorizing Provider  albuterol (PROVENTIL HFA;VENTOLIN HFA) 108 (90 BASE) MCG/ACT inhaler Inhale 2 puffs into the lungs every 6 (six) hours as needed for wheezing or shortness of breath.    Historical Provider, MD  carvedilol (COREG) 3.125 MG tablet Take 1 tablet (3.125 mg total) by mouth 2 (two) times daily. 11/06/15   Antoine Poche, MD  clopidogrel (PLAVIX) 75 MG tablet TAKE ONE (1) TABLET EACH DAY 02/20/16   Antoine Poche, MD  dabigatran (PRADAXA) 150 MG CAPS capsule Take 150 mg by mouth 2 (two) times daily.    Historical Provider, MD  digoxin (LANOXIN) 0.125 MG tablet Take 125 mcg by mouth daily.  Historical Provider, MD  furosemide (LASIX) 40 MG tablet Take 1 tablet (40 mg total) by mouth every other day. 11/21/14   Antoine Poche, MD  furosemide (LASIX) 40 MG tablet TAKE ONE TABLET EVERY OTHER DAY 12/25/15   Antoine Poche, MD  insulin aspart (NOVOLOG FLEXPEN) 100 UNIT/ML FlexPen Inject 6 Units into the skin 2 (two) times daily with a meal. 8am, 12pm, and 4pm 12/15/15   Oval Linsey, MD  insulin detemir (LEVEMIR) 100 UNIT/ML injection Inject 0.2 mLs (20 Units total) into the skin daily. 12/15/15   Oval Linsey, MD  lisinopril (PRINIVIL,ZESTRIL) 5 MG tablet  Take 1 tablet (5 mg total) by mouth daily. 02/28/14   Antoine Poche, MD  nitroGLYCERIN (NITROSTAT) 0.4 MG SL tablet Place 1 tablet (0.4 mg total) under the tongue every 5 (five) minutes as needed. Patient taking differently: Place 0.4 mg under the tongue every 5 (five) minutes as needed for chest pain.  08/10/15   Brittainy Sherlynn Carbon, PA-C  Oxycodone HCl 10 MG TABS Take 10 mg by mouth 4 (four) times daily as needed (for pain).    Historical Provider, MD  potassium chloride SA (K-DUR,KLOR-CON) 20 MEQ tablet TAKE ONE TABLET BY MOUTH TWICE A DAY 11/21/15   Antoine Poche, MD  ranolazine (RANEXA) 1000 MG SR tablet Take 1 tablet (1,000 mg total) by mouth 2 (two) times daily. 08/10/15   Brittainy Sherlynn Carbon, PA-C  rosuvastatin (CRESTOR) 40 MG tablet Take 40 mg by mouth daily.    Historical Provider, MD    Physical Exam: Filed Vitals:   04/18/16 1200 04/18/16 1230 04/18/16 1300 04/18/16 1330  BP: 110/67 92/50 100/60 118/70  Pulse: 81  78 87  Temp:      Resp: 22 17 27 20   Height:      Weight:      SpO2: 97%  98% 98%      Constitutional: NAD, calm, comfortable Filed Vitals:   04/18/16 1200 04/18/16 1230 04/18/16 1300 04/18/16 1330  BP: 110/67 92/50 100/60 118/70  Pulse: 81  78 87  Temp:      Resp: 22 17 27 20   Height:      Weight:      SpO2: 97%  98% 98%   Eyes: PERRL, lids and conjunctivae normal ENMT: Mucous membranes are moist. Posterior pharynx clear of any exudate or lesions.Normal dentition.  Neck: normal, supple, no masses, no thyromegaly Respiratory: clear to auscultation bilaterally, no wheezing, no crackles. Normal respiratory effort. No accessory muscle use.  Cardiovascular: Regular rate and rhythm, no murmurs / rubs / gallops. No extremity edema. 2+ pedal pulses. No carotid bruits.  Abdomen: no tenderness, no masses palpated. No hepatosplenomegaly. Bowel sounds positive.  Musculoskeletal: no clubbing / cyanosis. Good ROM, no contractures. Normal muscle tone.  Skin:  Normal skin turgor, open wound over lateral malleolus, not actively draining Neurologic: CN 2-12 grossly intact. Sensation intact, strength 5/5 in all 4.  Psychiatric: Alert, Normal mood.    Labs on Admission: I have personally reviewed following labs and imaging studies  CBC:  Recent Labs Lab 04/18/16 1122  WBC 18.7*  NEUTROABS 15.8*  HGB 15.6  HCT 45.5  MCV 91.9  PLT 211   Basic Metabolic Panel:  Recent Labs Lab 04/18/16 1122  NA 136  K 5.0  CL 104  CO2 21*  GLUCOSE 115*  BUN 28*  CREATININE 1.58*  CALCIUM 9.8   GFR: Estimated Creatinine Clearance: 42.4 mL/min (by C-G formula based on Cr of 1.58).  Liver Function Tests:  Recent Labs Lab 04/18/16 1122  AST 93*  ALT 29  ALKPHOS 84  BILITOT 0.7  PROT 8.3*  ALBUMIN 3.9   No results for input(s): LIPASE, AMYLASE in the last 168 hours. No results for input(s): AMMONIA in the last 168 hours. Coagulation Profile: No results for input(s): INR, PROTIME in the last 168 hours. Cardiac Enzymes: No results for input(s): CKTOTAL, CKMB, CKMBINDEX, TROPONINI in the last 168 hours. BNP (last 3 results) No results for input(s): PROBNP in the last 8760 hours. HbA1C: No results for input(s): HGBA1C in the last 72 hours. CBG: No results for input(s): GLUCAP in the last 168 hours. Lipid Profile: No results for input(s): CHOL, HDL, LDLCALC, TRIG, CHOLHDL, LDLDIRECT in the last 72 hours. Thyroid Function Tests: No results for input(s): TSH, T4TOTAL, FREET4, T3FREE, THYROIDAB in the last 72 hours. Anemia Panel: No results for input(s): VITAMINB12, FOLATE, FERRITIN, TIBC, IRON, RETICCTPCT in the last 72 hours. Urine analysis:    Component Value Date/Time   COLORURINE YELLOW 04/18/2016 1127   APPEARANCEUR HAZY* 04/18/2016 1127   LABSPEC >1.030* 04/18/2016 1127   PHURINE 5.5 04/18/2016 1127   GLUCOSEU NEGATIVE 04/18/2016 1127   HGBUR LARGE* 04/18/2016 1127   BILIRUBINUR NEGATIVE 04/18/2016 1127   KETONESUR NEGATIVE  04/18/2016 1127   PROTEINUR 100* 04/18/2016 1127   UROBILINOGEN 0.2 05/03/2014 2132   NITRITE NEGATIVE 04/18/2016 1127   LEUKOCYTESUR NEGATIVE 04/18/2016 1127   Sepsis Labs: (procalcitonin:4,lacticidven:4) )No results found for this or any previous visit (from the past 240 hour(s)).   Radiological Exams on Admission: Dg Chest 2 View  04/18/2016  CLINICAL DATA:  Weakness. EXAM: CHEST  2 VIEW COMPARISON:  12/12/2015 and 08/08/2015 FINDINGS: Overall heart size and pulmonary vascularity are normal and the lungs are clear of infiltrates. Slight chronic accentuation of the interstitial markings. CABG. No effusions. No acute bone abnormality. IMPRESSION: No active cardiopulmonary disease. Electronically Signed   By: Francene Boyers M.D.   On: 04/18/2016 12:59   Dg Ankle Complete Left  04/18/2016  CLINICAL DATA:  Ulceration of the lateral aspect of the left ankle. EXAM: LEFT ANKLE COMPLETE - 3+ VIEW COMPARISON:  Radiographs dated 11/30/2004 FINDINGS: There is a 2 cm soft tissue ulceration overlying the lateral malleolus. There is bone destruction of the superficial cortex of the lateral malleolus consistent with osteomyelitis. There is a small ankle effusion. There is suggestion of partial collapse of the navicular. This could represent early Charcot change. IMPRESSION: Soft tissue ulceration and osteomyelitis of underlying distal fibula at the lateral malleolus. Small ankle effusion. 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 Electronically Signed   By: Francene Boyers M.D.   On: 04/18/2016 13:03   Ct Head Wo Contrast  04/18/2016  CLINICAL DATA:  Altered mental status.  Weakness. EXAM: CT HEAD WITHOUT CONTRAST TECHNIQUE: Contiguous axial images were obtained from the base of the skull through the vertex without intravenous contrast. COMPARISON:  12/12/2015 FINDINGS: No mass lesion. No midline shift. No acute hemorrhage or hematoma. No extra-axial fluid collections. No evidence of acute  infarction. There is diffuse cerebral cortical and cerebellar atrophy. There is ventricular dilatation out proportion to the degree of atrophy suggesting communicating hydrocephalus. Extensive periventricular white matter lucency as well as a old lacunar infarcts in the basal ganglia. IMPRESSION: No significant change since the prior study. Diffuse atrophy. Ventricular dilatation suggesting the possibility of communicating hydrocephalus. Chronic small vessel disease including old lacunar infarcts. Electronically Signed   By: Francene Boyers M.D.  On: 04/18/2016 13:06    Assessment/Plan Principal Problem:   Osteomyelitis of ankle (HCC) Active Problems:   Diabetes (HCC)   Hyperlipidemia   Atrial fibrillation (HCC)   Chronic combined systolic and diastolic CHF, NYHA class 3 (HCC)   AKI (acute kidney injury) (HCC)    Cellulitis with Osteomyelitis -LLE over outer ankle with open wound -Family reports draining of pus from lesion -Xray of ankle with findings suggestive of osteomyelitis -MRI L ankle ordered through ED, pending -Given purulence, will continue Vancomycin for the time being for MRSA coverage. Will have to monitor renal function very carefully -Admit to med-surg  HLD -Cont statin per home regimen  Afib -Currently rate controlled -Cont pradaxa as tolerated  DM2 -Family reports very limited PO intake -Will continue pt on 1/2 usual dose of lantus (10 units) until pt tolerates PO better -Cont on SSI coverage  Chronic systolic/diastolic CHF -Clinically euvolemic to mildly dehydrated -Cont gentle IVF -Hold diuretics for the time being  AKI -Suspect secondary to mild dehydration secondary to decreased PO intake -Gentle IVF as tolerated  DVT prophylaxis: On Pradaxa Code Status: Full Family Communication: Multiple family members in room Disposition Plan: Uncertain at this time Consults called:  Admission status: Inpt, med-surg   CHIU, Scheryl MartenSTEPHEN K MD [facility] Pager [phone]  If 7PM-7AM, please contact night-coverage [email or web address] Password [email or web address]  04/18/2016, 2:26 PM

## 2016-04-18 NOTE — Progress Notes (Addendum)
Pharmacy Antibiotic Note  [name] is a 70 y.o. male admitted on 04/18/2016 with cellulitis, r/o osteomyelitis.  Pharmacy has been consulted for vancomycin dosing.Vancomycin 1gm ordered in ED  Plan: Vancomycin 500 mg IV q12 hours  Height: 5\' 10"  (177.8 cm) Weight: 150 lb (68.04 kg) IBW/kg (Calculated) : 73  Temp (24hrs), Avg:98.2 F (36.8 C), Min:98.2 F (36.8 C), Max:98.2 F (36.8 C)   Recent Labs Lab 04/18/16 1122  WBC 18.7*  CREATININE 1.58*    Estimated Creatinine Clearance: 42.4 mL/min (by C-G formula based on Cr of 1.58).    No Known Allergies  Antimicrobials this admission: vanc 4/28 >>    Thank you for allowing pharmacy to be a part of this patient's care.  Woodfin GanjaSeay, Menelik Mcfarren Poteet 04/18/2016 2:51 PM

## 2016-04-18 NOTE — ED Notes (Signed)
Gardiner Coinsatsy Duerksen (wife) [phone]

## 2016-04-18 NOTE — ED Notes (Addendum)
Pt bathed upon arrival. Cleansed of urine and feces. Denies any complaints. When asked why he has been refusing care pt states "I didn't know I had".

## 2016-04-18 NOTE — ED Notes (Signed)
EMS states pt's wife called for him refusing care.  States pt will not allow turning, cleaning, or any general care.  No family present.

## 2016-04-18 NOTE — ED Provider Notes (Signed)
CSN: [id number]     Arrival date & time 04/18/16  [id number] History  By signing my name below, I, Iona BeardChristian Pulliam, attest that this documentation has been prepared under the direction and in the presence of Bethann BerkshireJoseph Shontez Sermon, MD.   Electronically Signed: Iona Beardhristian Pulliam, ED Scribe. 04/18/2016. 11:22 AM   Chief Complaint  Patient presents with  . Illness    Patient is a 70 y.o. male presenting with altered mental status. The history is provided by a caregiver (Wife states patient is more confused than normal. Patient has a history of dementia). The history is limited by the condition of the patient.  Altered Mental Status Presenting symptoms: combativeness   Severity:  Mild Most recent episode:  More than 2 days ago Episode history:  Multiple Timing:  Constant Progression:  Waxing and waning Chronicity:  Recurrent Context: dementia   Context: not alcohol use   Level 5 Caveat: Altered Mental Status HPI Comments: HPI limited due to mental status of patient.   Past Medical History  Diagnosis Date  . Coronary atherosclerosis of native coronary artery     Ant. MI in 4/95; PTCA of 90% prox & distal LAD unsuccessful-->  Urgent CABG-4/95 TO Cx; 50% RCA; ant. HK; EF of 50-55%; 08/2008: 3-V disease plus a  95% LIMA stenosis, treated with DES; occlusion of SVG to CX; RCA SVG stenosis--> PCI with DES  2010: in-stent restenosis in the LIMA-->cutting balloon; EF of 35-40%;  07/2010: TO of SVG to RCA-medical therapy advised  . Atrial fibrillation (HCC)     1995, 2009; bradycardia with beta blocker therapy  . Peripheral vascular disease (HCC) 1995    Abdominal aortic obstruction 1995-->vascular surgery  . Hyperlipidemia   . Type 2 diabetes mellitus (HCC)   . DDD (degenerative disc disease)     Discectomy and fusion at L4 and L5  . History of alcohol abuse     Quit in 1999  . Chronic systolic heart failure (HCC)   . Non-compliance   . Ischemic cardiomyopathy     LVEF 40-45% August 2016   Past  Surgical History  Procedure Laterality Date  . Cholecystectomy  1995  . Lumbar fusion      +Discectomy;x2; L4 and L5  . Aorto-femoral bypass graft  1995  . Left heart catheterization with coronary angiogram N/A 12/14/2013    Procedure: LEFT HEART CATHETERIZATION WITH CORONARY ANGIOGRAM;  Surgeon: Micheline ChapmanMichael D Cooper, MD;  [facility];  Service: Cardiovascular;  Laterality: N/A;  . Back surgery     Family History  Problem Relation Age of Onset  . CAD Father    Social History  Substance Use Topics  . Smoking status: Current Every Day Smoker -- 0.50 packs/day for 40 years    Types: Cigarettes  . Smokeless tobacco: Current User     Comment: refused  . Alcohol Use: No     Comment: former    Review of Systems  Unable to perform ROS: Mental status change     Allergies  Review of patient's allergies indicates no known allergies.  Home Medications   Prior to Admission medications   Medication Sig Start Date End Date Taking? Authorizing Provider  albuterol (PROVENTIL HFA;VENTOLIN HFA) 108 (90 BASE) MCG/ACT inhaler Inhale 2 puffs into the lungs every 6 (six) hours as needed for wheezing or shortness of breath.    Historical Provider, MD  carvedilol (COREG) 3.125 MG tablet Take 1 tablet (3.125 mg total) by mouth 2 (two) times daily. 11/06/15  Antoine Poche, MD  clopidogrel (PLAVIX) 75 MG tablet TAKE ONE (1) TABLET EACH DAY 02/20/16   Antoine Poche, MD  dabigatran (PRADAXA) 150 MG CAPS capsule Take 150 mg by mouth 2 (two) times daily.    Historical Provider, MD  digoxin (LANOXIN) 0.125 MG tablet Take 125 mcg by mouth daily.      Historical Provider, MD  furosemide (LASIX) 40 MG tablet Take 1 tablet (40 mg total) by mouth every other day. 11/21/14   Antoine Poche, MD  furosemide (LASIX) 40 MG tablet TAKE ONE TABLET EVERY OTHER DAY 12/25/15   Antoine Poche, MD  insulin aspart (NOVOLOG FLEXPEN) 100 UNIT/ML FlexPen Inject 6 Units into the skin 2 (two) times daily with a  meal. 8am, 12pm, and 4pm 12/15/15   Oval Linsey, MD  insulin detemir (LEVEMIR) 100 UNIT/ML injection Inject 0.2 mLs (20 Units total) into the skin daily. 12/15/15   Oval Linsey, MD  lisinopril (PRINIVIL,ZESTRIL) 5 MG tablet Take 1 tablet (5 mg total) by mouth daily. 02/28/14   Antoine Poche, MD  nitroGLYCERIN (NITROSTAT) 0.4 MG SL tablet Place 1 tablet (0.4 mg total) under the tongue every 5 (five) minutes as needed. Patient taking differently: Place 0.4 mg under the tongue every 5 (five) minutes as needed for chest pain.  08/10/15   Brittainy Sherlynn Carbon, PA-C  Oxycodone HCl 10 MG TABS Take 10 mg by mouth 4 (four) times daily as needed (for pain).    Historical Provider, MD  potassium chloride SA (K-DUR,KLOR-CON) 20 MEQ tablet TAKE ONE TABLET BY MOUTH TWICE A DAY 11/21/15   Antoine Poche, MD  ranolazine (RANEXA) 1000 MG SR tablet Take 1 tablet (1,000 mg total) by mouth 2 (two) times daily. 08/10/15   Brittainy Sherlynn Carbon, PA-C  rosuvastatin (CRESTOR) 40 MG tablet Take 40 mg by mouth daily.    Historical Provider, MD   BP 106/59 mmHg  Pulse 84  Temp(Src) 98.2 F (36.8 C)  Resp 21  Ht  (1.778 m)  Wt 150 lb (68.04 kg)  BMI 21.52 kg/m2  SpO2 99% Physical Exam  Constitutional: He appears well-developed.  HENT:  Head: Normocephalic.  Mouth/Throat: Mucous membranes are dry.  Eyes: Conjunctivae and EOM are normal. No scleral icterus.  Neck: Neck supple. No thyromegaly present.  Cardiovascular: Normal rate and regular rhythm.  Exam reveals no gallop and no friction rub.   No murmur heard. Pulmonary/Chest: No stridor. He has no wheezes. He has no rales. He exhibits no tenderness.  Abdominal: He exhibits no distension. There is no tenderness. There is no rebound.  Genitourinary:  Scrotal erythema and rash.   Musculoskeletal: Normal range of motion. He exhibits no edema.  Grade 1 bedsore to left hip.  Grade 2 bedsore to left lateral ankle that appears infected.    Lymphadenopathy:    He has no cervical adenopathy.  Neurological: He is alert. He exhibits normal muscle tone. Coordination normal.  Alert. Only oriented to person.   Skin: No rash noted. No erythema.  Psychiatric: He has a normal mood and affect. His behavior is normal.    ED Course  Procedures (including critical care time) DIAGNOSTIC STUDIES: Oxygen Saturation is 99% on RA, normal by my interpretation.    COORDINATION OF CARE: 11:23 AM-Discussed treatment plan which includes CXR, DG ankle complete left, CT head without contrast, CBC with differential, BMP, and urinalysis with pt at bedside and pt agreed to plan.    Labs Review Labs Reviewed  CBC WITH DIFFERENTIAL/PLATELET  BASIC METABOLIC PANEL  URINALYSIS, ROUTINE W REFLEX MICROSCOPIC (NOT AT [facility])    Imaging Review No results found. I have personally reviewed and evaluated these images and lab results as part of my medical decision-making.   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  I personally performed the history, physical, and medical decision making and all procedures in the evaluation of this patient.Bethann Berkshire, MD 04/18/16 [id number]

## 2016-04-18 NOTE — ED Notes (Signed)
Attempted to call report at this time, RN unable to take report

## 2016-04-19 DIAGNOSIS — L03116 Cellulitis of left lower limb: Secondary | ICD-10-CM

## 2016-04-19 DIAGNOSIS — M86272 Subacute osteomyelitis, left ankle and foot: Secondary | ICD-10-CM

## 2016-04-19 DIAGNOSIS — L8990 Pressure ulcer of unspecified site, unspecified stage: Secondary | ICD-10-CM | POA: Insufficient documentation

## 2016-04-19 LAB — COMPREHENSIVE METABOLIC PANEL
ALK PHOS: 81 U/L (ref 38–126)
ALT: 37 U/L (ref 17–63)
AST: 89 U/L — ABNORMAL HIGH (ref 15–41)
Albumin: 3.3 g/dL — ABNORMAL LOW (ref 3.5–5.0)
Anion gap: 11 (ref 5–15)
BILIRUBIN TOTAL: 0.8 mg/dL (ref 0.3–1.2)
BUN: 25 mg/dL — ABNORMAL HIGH (ref 6–20)
CALCIUM: 9 mg/dL (ref 8.9–10.3)
CO2: 18 mmol/L — AB (ref 22–32)
Chloride: 105 mmol/L (ref 101–111)
Creatinine, Ser: 1.22 mg/dL (ref 0.61–1.24)
GFR calc non Af Amer: 59 mL/min — ABNORMAL LOW (ref >60)
Glucose, Bld: 243 mg/dL — ABNORMAL HIGH (ref 65–99)
Potassium: 4.6 mmol/L (ref 3.5–5.1)
SODIUM: 134 mmol/L — AB (ref 135–145)
Total Protein: 7.4 g/dL (ref 6.5–8.1)

## 2016-04-19 LAB — GLUCOSE, CAPILLARY
GLUCOSE-CAPILLARY: 233 mg/dL — AB (ref 65–99)
Glucose-Capillary: 261 mg/dL — ABNORMAL HIGH (ref 65–99)
Glucose-Capillary: 221 mg/dL — ABNORMAL HIGH (ref 65–99)
Glucose-Capillary: 248 mg/dL — ABNORMAL HIGH (ref 65–99)

## 2016-04-19 LAB — CBC
HCT: 41.9 % (ref 39.0–52.0)
Hemoglobin: 14.2 g/dL (ref 13.0–17.0)
MCH: 30.9 pg (ref 26.0–34.0)
MCHC: 33.9 g/dL (ref 30.0–36.0)
MCV: 91.1 fL (ref 78.0–100.0)
PLATELETS: 222 10*3/uL (ref 150–400)
RBC: 4.6 MIL/uL (ref 4.22–5.81)
RDW: 13.4 % (ref 11.5–15.5)
WBC: 20.6 10*3/uL — ABNORMAL HIGH (ref 4.0–10.5)

## 2016-04-19 MED ORDER — ISOSORBIDE MONONITRATE ER 60 MG PO TB24
30.0000 mg | ORAL_TABLET | Freq: Every day | ORAL | Status: DC
  Administered 2016-04-19 – 2016-04-23 (×5): 30 mg via ORAL
  Filled 2016-04-19 (×5): qty 1

## 2016-04-19 MED ORDER — LISINOPRIL 5 MG PO TABS
5.0000 mg | ORAL_TABLET | Freq: Every day | ORAL | Status: DC
  Administered 2016-04-19 – 2016-04-23 (×4): 5 mg via ORAL
  Filled 2016-04-19 (×5): qty 1

## 2016-04-19 MED ORDER — FUROSEMIDE 40 MG PO TABS
40.0000 mg | ORAL_TABLET | ORAL | Status: DC
  Administered 2016-04-19 – 2016-04-23 (×3): 40 mg via ORAL
  Filled 2016-04-19 (×4): qty 1

## 2016-04-19 MED ORDER — INSULIN DETEMIR 100 UNIT/ML ~~LOC~~ SOLN
20.0000 [IU] | Freq: Every day | SUBCUTANEOUS | Status: DC
  Administered 2016-04-19 – 2016-04-23 (×5): 20 [IU] via SUBCUTANEOUS
  Filled 2016-04-19 (×6): qty 0.2

## 2016-04-19 NOTE — Consult Note (Signed)
Patient ID: [name], male   DOB: 07/07/1946, 70 y.o.   MRN: [id number]  Consult requested Dr. 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 Illness   Chief orthopedic complaint is diabetic ulcer over the lateral malleolus of the left ankle with pain HPI [name] is a 70 y.o. male.  The patient was admitted for confusion.  Poor historian  He has left ankle pain of several days' duration moderate in severity quality dull aching sharp at times modified by pressure over the lateral malleolus  He has a diabetic foot ulcer proximally 2 cm x 2 cm length and width there is shallow ulceration but it is up against the fibula. I do not see any bone exposed  Review of Systems (at least 2) Review of Systems 1. Confusion 2. No chills or fever reported   Past Medical History  Diagnosis Date  . Coronary atherosclerosis of native coronary artery     Ant. MI in 4/95; PTCA of 90% prox & distal LAD unsuccessful-->  Urgent CABG-4/95 TO Cx; 50% RCA; ant. HK; EF of 50-55%; 08/2008: 3-V disease plus a  95% LIMA stenosis, treated with DES; occlusion of SVG to CX; RCA SVG stenosis--> PCI with DES  2010: in-stent restenosis in the LIMA-->cutting balloon; EF of 35-40%;  07/2010: TO of SVG to RCA-medical therapy advised  . Atrial fibrillation (HCC)     1995, 2009; bradycardia with beta blocker therapy  . Peripheral vascular disease (HCC) 1995    Abdominal aortic obstruction 1995-->vascular surgery  . Hyperlipidemia   . Type 2 diabetes mellitus (HCC)   . DDD (degenerative disc disease)     Discectomy and fusion at L4 and L5  . History of alcohol abuse     Quit in 1999  . Chronic systolic heart failure (HCC)   . Non-compliance   . Ischemic cardiomyopathy     LVEF 40-45% August 2016     Past Surgical History  Procedure Laterality Date  . Cholecystectomy  1995  . Lumbar fusion      +Discectomy;x2; L4 and L5   . Aorto-femoral bypass graft  1995  . Left heart catheterization with coronary angiogram N/A 12/14/2013    Procedure: LEFT HEART CATHETERIZATION WITH CORONARY ANGIOGRAM;  Surgeon: Micheline Chapman, MD;  Location: [facility] CATH LAB;  Service: Cardiovascular;  Laterality: N/A;  . Back surgery       Social History  Social History  Substance Use Topics  . Smoking status: Current Every Day Smoker -- 0.50 packs/day for 40 years    Types: Cigarettes  . Smokeless tobacco: Current User     Comment: refused  . Alcohol Use: No     Comment: former    No Known Allergies  Current Facility-Administered Medications  Medication Dose Route Frequency Provider Last Rate Last Dose  . albuterol (PROVENTIL) (2.5 MG/3ML) 0.083% nebulizer solution 2.5 mg  2.5 mg Nebulization Q6H PRN Oval Linsey, MD      . carvedilol (COREG) tablet 3.125 mg  3.125 mg Oral BID Jerald Kief, MD   3.125 mg at 04/19/16 [id number]  . clopidogrel (PLAVIX) tablet 75 mg  75 mg Oral Daily Jerald Kief, MD   75 mg at 04/19/16 8469  . dabigatran (PRADAXA) capsule 150 mg  150 mg Oral BID Jerald Kief, MD   150 mg at 04/19/16 6295  . digoxin (LANOXIN) tablet 125 mcg  125 mcg Oral  Daily Jerald KiefStephen K Chiu, MD   125 mcg at 04/19/16 [id number]  . furosemide (LASIX) tablet 40 mg  40 mg Oral Q48H Carylon Perchesoy Fagan, MD      . insulin aspart (novoLOG) injection 0-15 Units  0-15 Units Subcutaneous TID WC Jerald KiefStephen K Chiu, MD   5 Units at 04/19/16 [phone]  . insulin detemir (LEVEMIR) injection 20 Units  20 Units Subcutaneous Daily Carylon Perchesoy Fagan, MD      . isosorbide mononitrate (IMDUR) 24 hr tablet 30 mg  30 mg Oral Daily Carylon Perchesoy Fagan, MD      . lisinopril (PRINIVIL,ZESTRIL) tablet 5 mg  5 mg Oral Daily Carylon Perchesoy Fagan, MD      . ranolazine (RANEXA) 12 hr tablet 1,000 mg  1,000 mg Oral BID Jerald KiefStephen K Chiu, MD   1,000 mg at 04/19/16 [id number]  . rosuvastatin (CRESTOR) tablet 40 mg  40 mg Oral Daily Jerald KiefStephen K Chiu, MD   40 mg at 04/19/16 [id number]  . sodium chloride 0.9 % bolus 1,000 mL  1,000  mL Intravenous Once Bethann BerkshireJoseph Zammit, MD      . vancomycin (VANCOCIN) 500 mg in sodium chloride 0.9 % 100 mL IVPB  500 mg Intravenous Q12H Oval Linseyichard Dondiego, MD   500 mg at 04/19/16 0604      Physical Exam-Detailed Physical Exam  Blood pressure 138/87, pulse 97, temperature 97.8 F (36.6 C), temperature source Oral, resp. rate 20, height 5\' 9"  (1.753 m), weight 152 lb 1.6 oz (68.992 kg), SpO2 99 %. Gen. appearance general grooming hygiene are modest at best appearance frail Cardiovascular exam the pulses are 2+ with no peripheral edema  The ambulatory status is ambulatory with limping  Extremity examined right ankle   Inspection Range of motion assessment full range of motion normal  assessment stability test reveal no instability or laxity Muscle strength and muscle tone are normal with no atrophy or tremors Skin  intact no laceration  Sensation to touch is normal The patient is oriented to person place and time The patient's mood and affect show no depression or anxiety or agitation   left ankle over the lateral malleolus 2 x 2 ulceration superficial, no bone exposed surrounding erythema foot is plantigrade the ankle is reduced   MEDICAL DECISION MAKING (minimum/low)  Data Reviewed  I have personally reviewed the imaging studies and the report and my interprulceration noted in the soft tissues mild bone destruction of the lateral malleolus   Assessment  He has severe heart disease, is on 2 anticoagulants. He is a poor surgical candidate. He will need a cardiology consult and then management of this anticoagulant issue prior to surgery. I think he 'd  be a better candidate for general anesthesia than stopping his anticoagulation and proceeding with spinal anesthesia     Plan    Cardiology consult Continue IV antibiotics Reassess the situation after cardiology consultation       [name] CanadaStanley Bernardette Waldron 04/19/2016, 10:51 AM   Vickki HearingStanley E Gratia Disla MD

## 2016-04-19 NOTE — Progress Notes (Signed)
Subjective: This patient is a 70 year old male who was admitted yesterday by the hospitalist with left lateral ankle osteomyelitis. He has noted pain and swelling over the past week.  Objective: Vital signs in last 24 hours: Filed Vitals:   04/18/16 1534 04/18/16 1813 04/18/16 2147 04/19/16 0448  BP: 118/70 103/76 116/59 138/87  Pulse: 100 85 66 97  Temp:  98.2 F (36.8 C) 98.4 F (36.9 C) 97.8 F (36.6 C)  TempSrc:  Oral Oral Oral  Resp: Height:   (1.753 m)    Weight:  152 lb 1.6 oz (68.992 kg)    SpO2: 100% 100% 100% 99%   Weight change:   Intake/Output Summary (Last 24 hours) at 04/19/16 1002 Last data filed at 04/19/16 0445  Gross per 24 hour  Intake      0 ml  Output     20 ml  Net    -20 ml    Physical Exam: Afebrile. Alert. Lungs clear. Heart irregularly irregular. Abdomen soft and nontender. Extremities reveal late right lateral ankle ulcer with purulent drainage present. There is significant surrounding erythema.  Lab Results:    Results for orders placed or performed during the hospital encounter of 04/18/16 (from the past 24 hour(s))  CBC with Differential     Status: Abnormal   Collection Time: 04/18/16 11:22 AM  Result Value Ref Range   WBC 18.7 (H) 4.0 - 10.5 K/uL   RBC 4.95 4.22 - 5.81 MIL/uL   Hemoglobin 15.6 13.0 - 17.0 g/dL   HCT 16.1 09.6 - 04.5 %   MCV 91.9 78.0 - 100.0 fL   MCH 31.5 26.0 - 34.0 pg   MCHC 34.3 30.0 - 36.0 g/dL   RDW 40.9 81.1 - 91.4 %   Platelets 211 150 - 400 K/uL   Neutrophils Relative % 84 %   Neutro Abs 15.8 (H) 1.7 - 7.7 K/uL   Lymphocytes Relative 7 %   Lymphs Abs 1.3 0.7 - 4.0 K/uL   Monocytes Relative 9 %   Monocytes Absolute 1.6 (H) 0.1 - 1.0 K/uL   Eosinophils Relative 0 %   Eosinophils Absolute 0.0 0.0 - 0.7 K/uL   Basophils Relative 0 %   Basophils Absolute 0.0 0.0 - 0.1 K/uL  Basic metabolic panel     Status: Abnormal   Collection Time: 04/18/16 11:22 AM  Result Value Ref Range   Sodium  136 135 - 145 mmol/L   Potassium 5.0 3.5 - 5.1 mmol/L   Chloride 104 101 - 111 mmol/L   CO2 21 (L) 22 - 32 mmol/L   Glucose, Bld 115 (H) 65 - 99 mg/dL   BUN 28 (H) 6 - 20 mg/dL   Creatinine, Ser 7.82 (H) 0.61 - 1.24 mg/dL   Calcium 9.8 8.9 - 95.6 mg/dL   GFR calc non Af Amer 43 (L) >60 mL/min   GFR calc Af Amer 50 (L) >60 mL/min   Anion gap 11 5 - 15  Hepatic function panel     Status: Abnormal   Collection Time: 04/18/16 11:22 AM  Result Value Ref Range   Total Protein 8.3 (H) 6.5 - 8.1 g/dL   Albumin 3.9 3.5 - 5.0 g/dL   AST 93 (H) 15 - 41 U/L   ALT 29 17 - 63 U/L   Alkaline Phosphatase 84 38 - 126 U/L   Total Bilirubin 0.7 0.3 - 1.2 mg/dL   Bilirubin, Direct 0.1 0.1 - 0.5 mg/dL   Indirect  Bilirubin 0.6 0.3 - 0.9 mg/dL  Digoxin level     Status: None   Collection Time: 04/18/16 11:22 AM  Result Value Ref Range   Digoxin Level 1.4 0.8 - 2.0 ng/mL  Urinalysis, Routine w reflex microscopic (not at [facility])     Status: Abnormal   Collection Time: 04/18/16 11:27 AM  Result Value Ref Range   Color, Urine YELLOW YELLOW   APPearance HAZY (A) CLEAR   Specific Gravity, Urine >1.030 (H) 1.005 - 1.030   pH 5.5 5.0 - 8.0   Glucose, UA NEGATIVE NEGATIVE mg/dL   Hgb urine dipstick LARGE (A) NEGATIVE   Bilirubin Urine NEGATIVE NEGATIVE   Ketones, ur NEGATIVE NEGATIVE mg/dL   Protein, ur 161 (A) NEGATIVE mg/dL   Nitrite NEGATIVE NEGATIVE   Leukocytes, UA NEGATIVE NEGATIVE  Urine microscopic-add on     Status: Abnormal   Collection Time: 04/18/16 11:27 AM  Result Value Ref Range   Squamous Epithelial / LPF NONE SEEN NONE SEEN   WBC, UA NONE SEEN 0 - 5 WBC/hpf   RBC / HPF TOO NUMEROUS TO COUNT 0 - 5 RBC/hpf   Bacteria, UA MANY (A) NONE SEEN  Brain natriuretic peptide     Status: Abnormal   Collection Time: 04/18/16 11:28 AM  Result Value Ref Range   B Natriuretic Peptide 233.0 (H) 0.0 - 100.0 pg/mL  Culture, blood (routine x 2)     Status: None (Preliminary result)   Collection Time:  04/18/16  3:30 PM  Result Value Ref Range   Specimen Description BLOOD RIGHT HAND    Special Requests BOTTLES DRAWN AEROBIC AND ANAEROBIC 6CC EACH    Culture NO GROWTH < 24 HOURS    Report Status PENDING   Culture, blood (routine x 2)     Status: None (Preliminary result)   Collection Time: 04/18/16  3:38 PM  Result Value Ref Range   Specimen Description LEFT ANTECUBITAL    Special Requests BOTTLES DRAWN AEROBIC AND ANAEROBIC 6CC EACH    Culture NO GROWTH < 24 HOURS    Report Status PENDING   Comprehensive metabolic panel     Status: Abnormal   Collection Time: 04/19/16  6:13 AM  Result Value Ref Range   Sodium 134 (L) 135 - 145 mmol/L   Potassium 4.6 3.5 - 5.1 mmol/L   Chloride 105 101 - 111 mmol/L   CO2 18 (L) 22 - 32 mmol/L   Glucose, Bld 243 (H) 65 - 99 mg/dL   BUN 25 (H) 6 - 20 mg/dL   Creatinine, Ser 0.96 0.61 - 1.24 mg/dL   Calcium 9.0 8.9 - 04.5 mg/dL   Total Protein 7.4 6.5 - 8.1 g/dL   Albumin 3.3 (L) 3.5 - 5.0 g/dL   AST 89 (H) 15 - 41 U/L   ALT 37 17 - 63 U/L   Alkaline Phosphatase 81 38 - 126 U/L   Total Bilirubin 0.8 0.3 - 1.2 mg/dL   GFR calc non Af Amer 59 (L) >60 mL/min   GFR calc Af Amer >60 >60 mL/min   Anion gap 11 5 - 15  CBC     Status: Abnormal   Collection Time: 04/19/16  6:13 AM  Result Value Ref Range   WBC 20.6 (H) 4.0 - 10.5 K/uL   RBC 4.60 4.22 - 5.81 MIL/uL   Hemoglobin 14.2 13.0 - 17.0 g/dL   HCT 40.9 81.1 - 91.4 %   MCV 91.1 78.0 - 100.0 fL   MCH 30.9 26.0 -  34.0 pg   MCHC 33.9 30.0 - 36.0 g/dL   RDW 16.113.4 09.611.5 - 04.515.5 %   Platelets 222 150 - 400 K/uL  Glucose, capillary     Status: Abnormal   Collection Time: 04/19/16  7:52 AM  Result Value Ref Range   Glucose-Capillary 233 (H) 65 - 99 mg/dL     ABGS No results for input(s): PHART, PO2ART, TCO2, HCO3 in the last 72 hours.  Invalid input(s): PCO2 CULTURES Recent Results (from the past 240 hour(s))  Culture, blood (routine x 2)     Status: None (Preliminary result)   Collection  Time: 04/18/16  3:30 PM  Result Value Ref Range Status   Specimen Description BLOOD RIGHT HAND  Final   Special Requests BOTTLES DRAWN AEROBIC AND ANAEROBIC 6CC EACH  Final   Culture NO GROWTH < 24 HOURS  Final   Report Status PENDING  Incomplete  Culture, blood (routine x 2)     Status: None (Preliminary result)   Collection Time: 04/18/16  3:38 PM  Result Value Ref Range Status   Specimen Description LEFT ANTECUBITAL  Final   Special Requests BOTTLES DRAWN AEROBIC AND ANAEROBIC 6CC EACH  Final   Culture NO GROWTH < 24 HOURS  Final   Report Status PENDING  Incomplete   Studies/Results: Dg Chest 2 View  04/18/2016  CLINICAL DATA:  Weakness. EXAM: CHEST  2 VIEW COMPARISON:  12/12/2015 and 08/08/2015 FINDINGS: Overall heart size and pulmonary vascularity are normal and the lungs are clear of infiltrates. Slight chronic accentuation of the interstitial markings. CABG. No effusions. No acute bone abnormality. IMPRESSION: No active cardiopulmonary disease. Electronically Signed   By: Francene BoyersJames  Maxwell M.D.   On: 04/18/2016 12:59   Dg Ankle Complete Left  04/18/2016  CLINICAL DATA:  Ulceration of the lateral aspect of the left ankle. EXAM: LEFT ANKLE COMPLETE - 3+ VIEW COMPARISON:  Radiographs dated 11/30/2004 FINDINGS: There is a 2 cm soft tissue ulceration overlying the lateral malleolus. There is bone destruction of the superficial cortex of the lateral malleolus consistent with osteomyelitis. There is a small ankle effusion. There is suggestion of partial collapse of the navicular. This could represent early Charcot change. IMPRESSION: Soft tissue ulceration and osteomyelitis of underlying distal fibula at the lateral malleolus. Small ankle effusion. 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 Electronically Signed   By: Francene BoyersJames  Maxwell M.D.   On: 04/18/2016 13:03   Ct Head Wo Contrast  04/18/2016  CLINICAL DATA:  Altered mental status.  Weakness. EXAM: CT HEAD WITHOUT CONTRAST TECHNIQUE:  Contiguous axial images were obtained from the base of the skull through the vertex without intravenous contrast. COMPARISON:  12/12/2015 FINDINGS: No mass lesion. No midline shift. No acute hemorrhage or hematoma. No extra-axial fluid collections. No evidence of acute infarction. There is diffuse cerebral cortical and cerebellar atrophy. There is ventricular dilatation out proportion to the degree of atrophy suggesting communicating hydrocephalus. Extensive periventricular white matter lucency as well as a old lacunar infarcts in the basal ganglia. IMPRESSION: No significant change since the prior study. Diffuse atrophy. Ventricular dilatation suggesting the possibility of communicating hydrocephalus. Chronic small vessel disease including old lacunar infarcts. Electronically Signed   By: Francene BoyersJames  Maxwell M.D.   On: 04/18/2016 13:06   Micro Results: Recent Results (from the past 240 hour(s))  Culture, blood (routine x 2)     Status: None (Preliminary result)   Collection Time: 04/18/16  3:30 PM  Result Value Ref Range Status   Specimen Description  BLOOD RIGHT HAND  Final   Special Requests BOTTLES DRAWN AEROBIC AND ANAEROBIC 6CC EACH  Final   Culture NO GROWTH < 24 HOURS  Final   Report Status PENDING  Incomplete  Culture, blood (routine x 2)     Status: None (Preliminary result)   Collection Time: 04/18/16  3:38 PM  Result Value Ref Range Status   Specimen Description LEFT ANTECUBITAL  Final   Special Requests BOTTLES DRAWN AEROBIC AND ANAEROBIC 6CC EACH  Final   Culture NO GROWTH < 24 HOURS  Final   Report Status PENDING  Incomplete   Studies/Results: Dg Chest 2 View  04/18/2016  CLINICAL DATA:  Weakness. EXAM: CHEST  2 VIEW COMPARISON:  12/12/2015 and 08/08/2015 FINDINGS: Overall heart size and pulmonary vascularity are normal and the lungs are clear of infiltrates. Slight chronic accentuation of the interstitial markings. CABG. No effusions. No acute bone abnormality. IMPRESSION: No active  cardiopulmonary disease. Electronically Signed   By: Francene Boyers M.D.   On: 04/18/2016 12:59   Dg Ankle Complete Left  04/18/2016  CLINICAL DATA:  Ulceration of the lateral aspect of the left ankle. EXAM: LEFT ANKLE COMPLETE - 3+ VIEW COMPARISON:  Radiographs dated 11/30/2004 FINDINGS: There is a 2 cm soft tissue ulceration overlying the lateral malleolus. There is bone destruction of the superficial cortex of the lateral malleolus consistent with osteomyelitis. There is a small ankle effusion. There is suggestion of partial collapse of the navicular. This could represent early Charcot change. IMPRESSION: Soft tissue ulceration and osteomyelitis of underlying distal fibula at the lateral malleolus. Small ankle effusion. 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 Electronically Signed   By: Francene Boyers M.D.   On: 04/18/2016 13:03   Ct Head Wo Contrast  04/18/2016  CLINICAL DATA:  Altered mental status.  Weakness. EXAM: CT HEAD WITHOUT CONTRAST TECHNIQUE: Contiguous axial images were obtained from the base of the skull through the vertex without intravenous contrast. COMPARISON:  12/12/2015 FINDINGS: No mass lesion. No midline shift. No acute hemorrhage or hematoma. No extra-axial fluid collections. No evidence of acute infarction. There is diffuse cerebral cortical and cerebellar atrophy. There is ventricular dilatation out proportion to the degree of atrophy suggesting communicating hydrocephalus. Extensive periventricular white matter lucency as well as a old lacunar infarcts in the basal ganglia. IMPRESSION: No significant change since the prior study. Diffuse atrophy. Ventricular dilatation suggesting the possibility of communicating hydrocephalus. Chronic small vessel disease including old lacunar infarcts. Electronically Signed   By: Francene Boyers M.D.   On: 04/18/2016 13:06   Medications:  I have reviewed the patient's current medications Scheduled Meds: . carvedilol  3.125 mg Oral  BID  . clopidogrel  75 mg Oral Daily  . dabigatran  150 mg Oral BID  . digoxin  125 mcg Oral Daily  . insulin aspart  0-15 Units Subcutaneous TID WC  . ranolazine  1,000 mg Oral BID  . rosuvastatin  40 mg Oral Daily  . sodium chloride  1,000 mL Intravenous Once  . vancomycin  500 mg Intravenous Q12H   Continuous Infusions:  PRN Meds:.albuterol   Assessment/Plan: #1. Left lateral ankle osteomyelitis. White count 20.6. Continue vancomycin. Consult orthopedics. #2. Diabetes. Glucose 243. Continue insulin. #3. Chronic systolic heart failure. Lasix was held yesterday. BUN and creatinine have dropped from 28 and 1.58-25 and 1.22. Digoxin level is 1.4. #4. Chronic atrial fibrillation. Anticoagulated with Pradaxa. He is also receiving Plavix. #5. Hematuria. He denies gross hematuria but his urinalysis reveals too  numerous to count red blood cells. He denies dysuria although his urinalysis reveals bacteria. Culture pending. Principal Problem:   Osteomyelitis of ankle (HCC) Active Problems:   Diabetes (HCC)   Hyperlipidemia   Atrial fibrillation (HCC)   Chronic combined systolic and diastolic CHF, NYHA class 3 (HCC)   AKI (acute kidney injury) (HCC)   Cellulitis   Osteomyelitis (HCC)   Pressure ulcer     LOS: 1 day   Dellis Voght 04/19/2016, 10:02 AM

## 2016-04-20 LAB — URINE CULTURE
Culture: NO GROWTH
Special Requests: NORMAL

## 2016-04-20 LAB — GLUCOSE, CAPILLARY
GLUCOSE-CAPILLARY: 189 mg/dL — AB (ref 65–99)
GLUCOSE-CAPILLARY: 141 mg/dL — AB (ref 65–99)
Glucose-Capillary: 158 mg/dL — ABNORMAL HIGH (ref 65–99)
Glucose-Capillary: 219 mg/dL — ABNORMAL HIGH (ref 65–99)

## 2016-04-20 NOTE — Progress Notes (Signed)
Subjective: He states he feels better today. He is not experiencing fever. His case has been discussed with Dr. Romeo AppleHarrison.  Objective: Vital signs in last 24 hours: Filed Vitals:   04/19/16 1530 04/19/16 2159 04/20/16 0548 04/20/16 0935  BP: 130/82 90/65 81/62  89/58  Pulse: 98 82 84 100  Temp: 98 F (36.7 C) 98.2 F (36.8 C) 98 F (36.7 C)   TempSrc: Oral Oral Oral   Resp: 20 20 20 18   Height:      Weight:      SpO2: 97% 97% 98% 100%   Weight change:   Intake/Output Summary (Last 24 hours) at 04/20/16 1047 Last data filed at 04/19/16 1300  Gross per 24 hour  Intake    240 ml  Output      0 ml  Net    240 ml    Physical Exam: He is alert but is not oriented. He tells me it is [id number] and May.  Lungs clear. Heart irregularly irregular. Abdomen soft and nontender. Extremities reveal a nickel-sized left lateral malleolus ulcer.  Lab Results:    Results for orders placed or performed during the hospital encounter of 04/18/16 (from the past 24 hour(s))  Glucose, capillary     Status: Abnormal   Collection Time: 04/19/16 11:55 AM  Result Value Ref Range   Glucose-Capillary 261 (H) 65 - 99 mg/dL   Comment 1 Notify RN    Comment 2 Document in Chart   Glucose, capillary     Status: Abnormal   Collection Time: 04/19/16  4:06 PM  Result Value Ref Range   Glucose-Capillary 221 (H) 65 - 99 mg/dL  Glucose, capillary     Status: Abnormal   Collection Time: 04/19/16  8:15 PM  Result Value Ref Range   Glucose-Capillary 248 (H) 65 - 99 mg/dL   Comment 1 Notify RN    Comment 2 Document in Chart   Glucose, capillary     Status: Abnormal   Collection Time: 04/20/16  7:49 AM  Result Value Ref Range   Glucose-Capillary 158 (H) 65 - 99 mg/dL     ABGS No results for input(s): PHART, PO2ART, TCO2, HCO3 in the last 72 hours.  Invalid input(s): PCO2 CULTURES Recent Results (from the past 240 hour(s))  Culture, blood (routine x 2)     Status: None (Preliminary result)   Collection  Time: 04/18/16  3:30 PM  Result Value Ref Range Status   Specimen Description BLOOD RIGHT HAND  Final   Special Requests BOTTLES DRAWN AEROBIC AND ANAEROBIC 6CC EACH  Final   Culture NO GROWTH 2 DAYS  Final   Report Status PENDING  Incomplete  Culture, blood (routine x 2)     Status: None (Preliminary result)   Collection Time: 04/18/16  3:38 PM  Result Value Ref Range Status   Specimen Description LEFT ANTECUBITAL  Final   Special Requests BOTTLES DRAWN AEROBIC AND ANAEROBIC 6CC EACH  Final   Culture NO GROWTH 2 DAYS  Final   Report Status PENDING  Incomplete  Urine culture     Status: None (Preliminary result)   Collection Time: 04/18/16  3:40 PM  Result Value Ref Range Status   Specimen Description URINE, CATHETERIZED  Final   Special Requests Normal  Final   Culture   Final    NO GROWTH < 12 HOURS [facility]    Report Status PENDING  Incomplete   Studies/Results: Dg Chest 2 View  04/18/2016  CLINICAL DATA:  Weakness. EXAM: CHEST  2 VIEW COMPARISON:  12/12/2015 and 08/08/2015 FINDINGS: Overall heart size and pulmonary vascularity are normal and the lungs are clear of infiltrates. Slight chronic accentuation of the interstitial markings. CABG. No effusions. No acute bone abnormality. IMPRESSION: No active cardiopulmonary disease. Electronically Signed   By: Francene Boyers M.D.   On: 04/18/2016 12:59   Dg Ankle Complete Left  04/18/2016  CLINICAL DATA:  Ulceration of the lateral aspect of the left ankle. EXAM: LEFT ANKLE COMPLETE - 3+ VIEW COMPARISON:  Radiographs dated 11/30/2004 FINDINGS: There is a 2 cm soft tissue ulceration overlying the lateral malleolus. There is bone destruction of the superficial cortex of the lateral malleolus consistent with osteomyelitis. There is a small ankle effusion. There is suggestion of partial collapse of the navicular. This could represent early Charcot change. IMPRESSION: Soft tissue ulceration and osteomyelitis of underlying  distal fibula at the lateral malleolus. Small ankle effusion. 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 Electronically Signed   By: Francene Boyers M.D.   On: 04/18/2016 13:03   Ct Head Wo Contrast  04/18/2016  CLINICAL DATA:  Altered mental status.  Weakness. EXAM: CT HEAD WITHOUT CONTRAST TECHNIQUE: Contiguous axial images were obtained from the base of the skull through the vertex without intravenous contrast. COMPARISON:  12/12/2015 FINDINGS: No mass lesion. No midline shift. No acute hemorrhage or hematoma. No extra-axial fluid collections. No evidence of acute infarction. There is diffuse cerebral cortical and cerebellar atrophy. There is ventricular dilatation out proportion to the degree of atrophy suggesting communicating hydrocephalus. Extensive periventricular white matter lucency as well as a old lacunar infarcts in the basal ganglia. IMPRESSION: No significant change since the prior study. Diffuse atrophy. Ventricular dilatation suggesting the possibility of communicating hydrocephalus. Chronic small vessel disease including old lacunar infarcts. Electronically Signed   By: Francene Boyers M.D.   On: 04/18/2016 13:06   Micro Results: Recent Results (from the past 240 hour(s))  Culture, blood (routine x 2)     Status: None (Preliminary result)   Collection Time: 04/18/16  3:30 PM  Result Value Ref Range Status   Specimen Description BLOOD RIGHT HAND  Final   Special Requests BOTTLES DRAWN AEROBIC AND ANAEROBIC 6CC EACH  Final   Culture NO GROWTH 2 DAYS  Final   Report Status PENDING  Incomplete  Culture, blood (routine x 2)     Status: None (Preliminary result)   Collection Time: 04/18/16  3:38 PM  Result Value Ref Range Status   Specimen Description LEFT ANTECUBITAL  Final   Special Requests BOTTLES DRAWN AEROBIC AND ANAEROBIC 6CC EACH  Final   Culture NO GROWTH 2 DAYS  Final   Report Status PENDING  Incomplete  Urine culture     Status: None (Preliminary result)    Collection Time: 04/18/16  3:40 PM  Result Value Ref Range Status   Specimen Description URINE, CATHETERIZED  Final   Special Requests Normal  Final   Culture   Final    NO GROWTH < 12 HOURS Performed at [facility]    Report Status PENDING  Incomplete   Studies/Results: Dg Chest 2 View  04/18/2016  CLINICAL DATA:  Weakness. EXAM: CHEST  2 VIEW COMPARISON:  12/12/2015 and 08/08/2015 FINDINGS: Overall heart size and pulmonary vascularity are normal and the lungs are clear of infiltrates. Slight chronic accentuation of the interstitial markings. CABG. No effusions. No acute bone abnormality. IMPRESSION: No active cardiopulmonary disease. Electronically Signed   By: Francene Boyers  M.D.   On: 04/18/2016 12:59   Dg Ankle Complete Left  04/18/2016  CLINICAL DATA:  Ulceration of the lateral aspect of the left ankle. EXAM: LEFT ANKLE COMPLETE - 3+ VIEW COMPARISON:  Radiographs dated 11/30/2004 FINDINGS: There is a 2 cm soft tissue ulceration overlying the lateral malleolus. There is bone destruction of the superficial cortex of the lateral malleolus consistent with osteomyelitis. There is a small ankle effusion. There is suggestion of partial collapse of the navicular. This could represent early Charcot change. IMPRESSION: Soft tissue ulceration and osteomyelitis of underlying distal fibula at the lateral malleolus. Small ankle effusion. 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 Electronically Signed   By: Francene Boyers M.D.   On: 04/18/2016 13:03   Ct Head Wo Contrast  04/18/2016  CLINICAL DATA:  Altered mental status.  Weakness. EXAM: CT HEAD WITHOUT CONTRAST TECHNIQUE: Contiguous axial images were obtained from the base of the skull through the vertex without intravenous contrast. COMPARISON:  12/12/2015 FINDINGS: No mass lesion. No midline shift. No acute hemorrhage or hematoma. No extra-axial fluid collections. No evidence of acute infarction. There is diffuse cerebral cortical and  cerebellar atrophy. There is ventricular dilatation out proportion to the degree of atrophy suggesting communicating hydrocephalus. Extensive periventricular white matter lucency as well as a old lacunar infarcts in the basal ganglia. IMPRESSION: No significant change since the prior study. Diffuse atrophy. Ventricular dilatation suggesting the possibility of communicating hydrocephalus. Chronic small vessel disease including old lacunar infarcts. Electronically Signed   By: Francene Boyers M.D.   On: 04/18/2016 13:06   Medications:  I have reviewed the patient's current medications Scheduled Meds: . carvedilol  3.125 mg Oral BID  . clopidogrel  75 mg Oral Daily  . dabigatran  150 mg Oral BID  . digoxin  125 mcg Oral Daily  . furosemide  40 mg Oral Q48H  . insulin aspart  0-15 Units Subcutaneous TID WC  . insulin detemir  20 Units Subcutaneous Daily  . isosorbide mononitrate  30 mg Oral Daily  . lisinopril  5 mg Oral Daily  . ranolazine  1,000 mg Oral BID  . rosuvastatin  40 mg Oral Daily  . sodium chloride  1,000 mL Intravenous Once  . vancomycin  500 mg Intravenous Q12H   Continuous Infusions:  PRN Meds:.albuterol   Assessment/Plan: #1. Left fibula osteomyelitis. Continue vancomycin. Appreciate orthopedic consult. Cardiology consult pending prior to any procedure. #2. Chronic atrial fibrillation anticoagulated with Pradaxa. #3. Diabetes. Glucose 158. Continue Levemir. Continue NovoLog.  Blood cultures negative so far. Principal Problem:   Osteomyelitis of ankle (HCC) Active Problems:   Diabetes (HCC)   Hyperlipidemia   Atrial fibrillation (HCC)   Chronic combined systolic and diastolic CHF, NYHA class 3 (HCC)   AKI (acute kidney injury) (HCC)   Cellulitis   Osteomyelitis (HCC)   Pressure ulcer     LOS: 2 days   [name] 04/20/2016, 10:47 AM

## 2016-04-20 NOTE — Progress Notes (Signed)
Patient ID: [name], male   DOB: 01/25/1946, 70 y.o.   MRN: [id number] BP 89/58 mmHg  Pulse 100  Temp(Src) 98 F (36.7 C) (Oral)  Resp 18  Ht 5\' 9"  (1.753 m)  Wt 152 lb 1.6 oz (68.992 kg)  BMI 22.45 kg/m2  SpO2 100%  This patient is a high cardiac risk on dabigatran and Plavix and needs to have a wound debridement of his left ankle  He can continue IV antibiotics until we get a cardiology consult regarding his perioperative risk and perioperative management of Plavix and.dabigatran Once we get the cardiology consult we can plan for surgical debridement.

## 2016-04-21 ENCOUNTER — Inpatient Hospital Stay (HOSPITAL_COMMUNITY): Payer: Medicare Other

## 2016-04-21 DIAGNOSIS — I25119 Atherosclerotic heart disease of native coronary artery with unspecified angina pectoris: Secondary | ICD-10-CM

## 2016-04-21 DIAGNOSIS — Z01810 Encounter for preprocedural cardiovascular examination: Secondary | ICD-10-CM

## 2016-04-21 DIAGNOSIS — I255 Ischemic cardiomyopathy: Secondary | ICD-10-CM

## 2016-04-21 LAB — CBC WITH DIFFERENTIAL/PLATELET
BASOS ABS: 0 10*3/uL (ref 0.0–0.1)
BASOS PCT: 0 %
Eosinophils Absolute: 0.1 10*3/uL (ref 0.0–0.7)
Eosinophils Relative: 1 %
HEMATOCRIT: 39.5 % (ref 39.0–52.0)
Hemoglobin: 13.3 g/dL (ref 13.0–17.0)
Lymphocytes Relative: 10 %
Lymphs Abs: 2 10*3/uL (ref 0.7–4.0)
MCH: 30.9 pg (ref 26.0–34.0)
MCHC: 33.7 g/dL (ref 30.0–36.0)
MCV: 91.6 fL (ref 78.0–100.0)
MONO ABS: 1.6 10*3/uL — AB (ref 0.1–1.0)
Monocytes Relative: 8 %
NEUTROS ABS: 16.1 10*3/uL — AB (ref 1.7–7.7)
Neutrophils Relative %: 81 %
PLATELETS: 202 10*3/uL (ref 150–400)
RBC: 4.31 MIL/uL (ref 4.22–5.81)
RDW: 13.5 % (ref 11.5–15.5)
WBC: 19.9 10*3/uL — AB (ref 4.0–10.5)

## 2016-04-21 LAB — GLUCOSE, CAPILLARY
GLUCOSE-CAPILLARY: 134 mg/dL — AB (ref 65–99)
GLUCOSE-CAPILLARY: 153 mg/dL — AB (ref 65–99)
GLUCOSE-CAPILLARY: 87 mg/dL (ref 65–99)

## 2016-04-21 LAB — BASIC METABOLIC PANEL
ANION GAP: 10 (ref 5–15)
BUN: 28 mg/dL — ABNORMAL HIGH (ref 6–20)
CALCIUM: 8.9 mg/dL (ref 8.9–10.3)
CO2: 24 mmol/L (ref 22–32)
Chloride: 102 mmol/L (ref 101–111)
Creatinine, Ser: 1.11 mg/dL (ref 0.61–1.24)
GLUCOSE: 130 mg/dL — AB (ref 65–99)
Potassium: 4 mmol/L (ref 3.5–5.1)
SODIUM: 136 mmol/L (ref 135–145)

## 2016-04-21 LAB — VANCOMYCIN, TROUGH: Vancomycin Tr: 13 ug/mL (ref 10.0–20.0)

## 2016-04-21 MED ORDER — VANCOMYCIN HCL IN DEXTROSE 750-5 MG/150ML-% IV SOLN
750.0000 mg | Freq: Two times a day (BID) | INTRAVENOUS | Status: DC
  Administered 2016-04-21 – 2016-04-23 (×5): 750 mg via INTRAVENOUS
  Filled 2016-04-21 (×6): qty 150

## 2016-04-21 MED ORDER — ASPIRIN 81 MG PO CHEW
81.0000 mg | CHEWABLE_TABLET | Freq: Every day | ORAL | Status: DC
  Administered 2016-04-21 – 2016-04-22 (×2): 81 mg via ORAL
  Filled 2016-04-21: qty 1

## 2016-04-21 MED ORDER — ASPIRIN 81 MG PO CHEW
CHEWABLE_TABLET | ORAL | Status: AC
  Administered 2016-04-21: 10:00:00
  Filled 2016-04-21: qty 1

## 2016-04-21 NOTE — Consult Note (Signed)
CARDIOLOGY CONSULT NOTE   Patient ID: [name] MRN: [id number] DOB/AGE: [birth year] 70 y.o.  Admit Date: 04/18/2016 Referring Physician: [name] Canada MD Primary Physician: [name], MD Consulting Cardiologist: Nona Dell MD Primary Cardiologist Dina Rich Md Reason for Consultation: Pre-Operative Evaluation   Clinical Summary [name] is a 70 y.o.male with known history of coronary artery disease, coronary artery bypass grafting in 1995, drug-eluting stent to the LIMA, SVG to RCA, atrial fibrillation, CHADS VASC Score of 4 on Pradaxa,  ischemic cardiomyopathy, with most recent echocardiogram revealing EF of 40-45% and multiple medical issues who was admitted for complaints of acute onset of altered mental status changes, and not following commands. On evaluation in the emergency room he was found to have leukocytosis and lower extremity edema. He was noted to have lower extremity edema on the left, with x-ray revealing soft tissue ulceration and osteomyelitis of the underlying distal fibula and lateral malleolus.   On arrival in the emergency room patient blood pressure was 106/59, heart rate 101, O2 sat 99% he was afebrile with a temperature of 98.2. BNP 233. White blood cells 18.7, he was not found to be anemic. Potassium 5.0 creatinine 1.58 with a glucose of 115. EKG revealed coarse atrial fib with rate of 82 bpm with repolarization abnormalities. He was treated with IV fluids and  vancomycin.  Dr. Romeo Apple has been consulted for surgical intervention, probable debridement. He is planned to have general anesthesia instead of spinal anesthesia to avoid stopping anticoagulation therapy.. We are requested to make recommendations concerning anticoagulation therapy perioperatively in the setting of CAD.  Patient is a poor historian and is still somewhat confused. He denies chest pain or dyspnea. He states his wife helps him with his medications.   No Known  Allergies  Medications Scheduled Medications: . aspirin  81 mg Oral Daily  . carvedilol  3.125 mg Oral BID  . digoxin  125 mcg Oral Daily  . furosemide  40 mg Oral Q48H  . insulin aspart  0-15 Units Subcutaneous TID WC  . insulin detemir  20 Units Subcutaneous Daily  . isosorbide mononitrate  30 mg Oral Daily  . lisinopril  5 mg Oral Daily  . ranolazine  1,000 mg Oral BID  . rosuvastatin  40 mg Oral Daily  . sodium chloride  1,000 mL Intravenous Once  . vancomycin  750 mg Intravenous Q12H    PRN Medications: albuterol   Past Medical History  Diagnosis Date  . Coronary atherosclerosis of native coronary artery     Ant. MI in 4/95; PTCA of 90% prox & distal LAD unsuccessful-->  Urgent CABG-4/95 TO Cx; 50% RCA; ant. HK; EF of 50-55%; 08/2008: 3-V disease plus a  95% LIMA stenosis, treated with DES; occlusion of SVG to CX; RCA SVG stenosis--> PCI with DES  2010: in-stent restenosis in the LIMA-->cutting balloon; EF of 35-40%;  07/2010: TO of SVG to RCA-medical therapy advised  . Atrial fibrillation (HCC)     1995, 2009; bradycardia with beta blocker therapy  . Peripheral vascular disease (HCC) 1995    Abdominal aortic obstruction 1995-->vascular surgery  . Hyperlipidemia   . Type 2 diabetes mellitus (HCC)   . DDD (degenerative disc disease)     Discectomy and fusion at L4 and L5  . History of alcohol abuse     Quit in 1999  . Chronic systolic heart failure (HCC)   . Non-compliance   . Ischemic cardiomyopathy     LVEF 40-45% August  2016    Past Surgical History  Procedure Laterality Date  . Cholecystectomy  1995  . Lumbar fusion      +Discectomy;x2; L4 and L5  . Aorto-femoral bypass graft  1995  . Left heart catheterization with coronary angiogram N/A 12/14/2013    Procedure: LEFT HEART CATHETERIZATION WITH CORONARY ANGIOGRAM;  Surgeon: Micheline ChapmanMichael D Cooper, MD;  [facility];  Service: Cardiovascular;  Laterality: N/A;  . Back surgery      Family History  Problem  Relation Age of Onset  . CAD Father     Social History [name] reports that he has been smoking Cigarettes.  He has a 20 pack-year smoking history. He uses smokeless tobacco. [name] reports that he does not drink alcohol.  Review of Systems Complete review of systems are found to be negative unless outlined in H&P above. Pain in left ankle.  Physical Examination Blood pressure 119/70, pulse 91, temperature 97 F (36.1 C), temperature source Oral, resp. rate 20, height 5\' 9"  (1.753 m), weight 152 lb 1.6 oz (68.992 kg), SpO2 99 %.  Intake/Output Summary (Last 24 hours) at 04/21/16 0942 Last data filed at 04/21/16 0800  Gross per 24 hour  Intake    520 ml  Output      0 ml  Net    520 ml    Telemetry: None.   GEN: Frail, ill-appearing, disheveled HEENT: Conjunctiva and lids normal, oropharynx clear with moist mucosa. Neck: Supple, no elevated JVP soft bilateral carotid bruits, no thyromegaly. Lungs: Mild inspiratory crackles,  Cardiac: Irregular rate and rhythm, no S3 or significant systolic murmur, no pericardial rub. Abdomen: Soft, nontender, no hepatomegaly, bowel sounds present, no guarding or rebound. Extremities: No pitting edema, distal pulses 1+. Dressing to left heel with some distal edema noted. Some pain with palpation. Right foot distal pulses are diminished. Skin: Warm and dry. Musculoskeletal: No kyphosis. Neuropsychiatric: Alert and oriented x 2 . Mild confusion.  Prior Cardiac Testing/Procedures 1. Echocardiogram 08/09/2015 Left ventricle: The cavity size was normal. Wall thickness was  normal. Systolic function was mildly to moderately reduced. The  estimated ejection fraction was in the range of 40% to 45%. There  is akinesis of the apicalanteroseptal and apical myocardium. The  study is not technically sufficient to allow evaluation of LV  diastolic function. - Mitral valve: Calcified annulus. - Left atrium: The atrium was mildly dilated.  2.  NM Stress Test 09/20/2013 IMPRESSION: High risk study for major cardiac events due to low ejection fraction and multiple perfusion defects. There is a large fixed anteroseptal scar. There is a moderate sized defect in the inferolateral wall with minimal myocardium at jeopardy. There were no diagnostic ST-segment abnormalities. No chest pain reported. LVEF 29%  3. Cardiac Cath 12/14/2013  Assessment: 1. Severe 3-VAD CAD 2. SVG x 2 (RCA and OM) chronically occluded 3. Patent LIMA to LAD 4. Severe LV dysfunction EF 20-25% with elevated LVEDP  Plan/Discussion:  Coronary anatomy is stable since prior cath in 2011. No clear culprit for NSTEMI or target for revascularization. Elevated filling pressures.  Lab Results  Basic Metabolic Panel:  Recent Labs Lab 04/18/16 1122 04/19/16 0613 04/21/16 0521  NA 136 134* 136  K 5.0 4.6 4.0  CL 104 105 102  CO2 21* 18* 24  GLUCOSE 115* 243* 130*  BUN 28* 25* 28*  CREATININE 1.58* 1.22 1.11  CALCIUM 9.8 9.0 8.9    Liver Function Tests:  Recent Labs Lab 04/18/16 1122 04/19/16 [id number]  AST 93* 89*  ALT 29 37  ALKPHOS 84 81  BILITOT 0.7 0.8  PROT 8.3* 7.4  ALBUMIN 3.9 3.3*    CBC:  Recent Labs Lab 04/18/16 1122 04/19/16 0613 04/21/16 0521  WBC 18.7* 20.6* 19.9*  NEUTROABS 15.8*  --  16.1*  HGB 15.6 14.2 13.3  HCT 45.5 41.9 39.5  MCV 91.9 91.1 91.6  PLT 211 222 202   Radiology: FINDINGS: There is a 2 cm soft tissue ulceration overlying the lateral malleolus. There is bone destruction of the superficial cortex of the lateral malleolus consistent with osteomyelitis.  There is a small ankle effusion. There is suggestion of partial collapse of the navicular. This could represent early Charcot change.  IMPRESSION: Soft tissue ulceration and osteomyelitis of underlying distal fibula at the lateral malleolus.  Small ankle effusion.  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  ECG: Coarse atrial  fibrillation rate of 82 bpm.   Impression and Recommendations  1. Preoperative evaluation: CAD with known history of coronary artery bypass grafting, stents to LIMA and SVG to RCA. He has been maintained on medical therapy including Plavix and antianginal regimen. The patient denies any chest pain. He is somewhat confused and is denying any symptoms. Would continue beta blocker and nitrates perioperatively. We can stop Plavix temporarily as he has had PCI in 2011. May be able to leave off of this and start ASA, but will defer to Dr. [name] on follow up. Overall he will be intermediate perioperative risk for cardiac complications.  2. Ischemic cardiomyopathy: Most recent echocardiogram in August 2016 revealed an LV systolic function which was moderately reduced with EF of 40-45%. Review of home medications has Lasix 40 mg a mouth daily but this is not been reinstituted during admission. Will need to watch for fluid overload post-operatively. Will have CXR completed.   3. Atrial fibrillation: Heart rate is controlled. Will hold Pradaxa pre-operatively. GFR is ranging between 40 and 60, will therefore need to hold Pradaxa for at least 3 days prior to surgery to reduce risk of bleeding. Place on telemetry. Continue coreg for HR control.  4. Left malleolus osteomyelitis: Continues on antibiotics.  Signed: Bettey Mare. Lawrence NP AACC  04/21/2016, 9:42 AM Co-Sign MD  Attending note:  Patient seen and examined. Reviewed records and modified above note by Ms. Lawrence NP. Mr. Suastegui is currently hospitalized with left fibular osteomyelitis, on broad-spectrum antibiotics, and possibility of surgical debridement is being considered by Dr. Romeo Apple. Patient does not report any angina symptoms on medical therapy. 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 LVEF is 40-45% by last assessment with known ischemic cardiomyopathy.  He also has chronic atrial fibrillation and has been on Pradaxa for stroke prophylaxis. Recent GFR ranging from 40-60.  On examination he reports discomfort in his left lower leg, particular with touch. He is afebrile, heart rate in the 90s and irregular, blood pressure 119/70. He is chronically ill-appearing but in no distress. Lungs exhibit decreased breath sounds, cardiac exam with irregularly irregular rhythm and soft systolic murmur. Left lower leg and lateral malleolus are dressed. Lab work shows creatinine down to 1.1, hemoglobin 13.3, CBC 19.9, platelets 202.  Preoperative evaluation prior to planned left lower leg debridement, presumably under general anesthesia per Dr. Romeo Apple. From a cardiac perspective he is at overall intermediate risk for perioperative complications. Would continue baseline ischemic regimen, although he would need to stop Plavix, generally recommended for at least  5 days prior to surgery, unless it is felt that his bleeding risk related to the procedure is not overly high. As far as Pradaxa is concerned, would also plan on stopping this for at least 3 days prior to surgery based on recent GFR levels. Do not plan to bridge with heparin, particularly since Pradaxa could be started back fairly soon after surgery.  Jonelle Sidle, M.D., F.A.C.C.

## 2016-04-21 NOTE — Progress Notes (Signed)
Orthopedic progress note and follow-up  The patient's anticoagulation medication was stopped today so 3 days would be Monday Tuesday and Wednesday and then potentially surgery on Thursday.  However, I will not be in town this weekend and he will have to see a Development worker, international aidgeneral surgeon or another orthopedic surgeon for debridement of this ulcer and wound  Again I will speak with Dr. Delbert Harnesson Diego regarding this tomorrow

## 2016-04-21 NOTE — Progress Notes (Signed)
Inpatient Diabetes Program Recommendations  AACE/ADA: New Consensus Statement on Inpatient Glycemic Control (2015)  Target Ranges:  Prepandial:   less than 140 mg/dL      Peak postprandial:   less than 180 mg/dL (1-2 hours)      Critically ill patients:  140 - 180 mg/dL  Results for [name], [name] (MRN [id number]) as of 04/21/2016 08:55  Ref. Range 04/20/2016 07:49 04/20/2016 11:49 04/20/2016 16:35 04/20/2016 20:41 04/21/2016 07:32  Glucose-Capillary Latest Ref Range: 65-99 mg/dL 063158 (H) 016219 (H) 010189 (H) 141 (H) 134 (H)   Review of Glycemic Control  Diabetes history: DM2 Outpatient Diabetes medications: Levemir 40-50 units daily, Novolog 20 units TID with meals Current orders for Inpatient glycemic control: Levemir 20 units daily, Novolog 0-15 units TID with meals  Inpatient Diabetes Program Recommendations: Correction (SSI): Please consider ordering Novolog BEDTIME correction scale. Insulin - Meal Coverage: Please consider ordering Novolog 4 units TID with meals for meal coverage.  Thanks, Orlando PennerMarie Willia Genrich, RN, MSN, CDE Diabetes Coordinator Inpatient Diabetes Program [phone] (Team Pager from 8am to 5pm) [phone] ([facility] office) [phone] [facility] office) [phone] [facility] office)

## 2016-04-21 NOTE — Progress Notes (Signed)
Patient has cellulitis with destruction of the lateral malleolus presumed osteomyelitis seen in consultation by cardiology as well as orthopedic surgery dual anticoagulation agents have been stopped in anticipation of surgical debridement early on vancomycin no evidence of clinical ischemia present Cala Bradfordugene F Sizer [id number] DOB: 1946-01-01 DOA: 04/18/2016 PCP: Isabella StallingNDIEGO,Cedric Denison M, MD             Physical Exam: Blood pressure 118/61, pulse 81, temperature 97.5 F (36.4 C), temperature source Oral, resp. rate 20, height 5\' 9"  (1.753 m), weight 152 lb 1.6 oz (68.992 kg), SpO2 98 %. lungs show prolonged x-ray phase scattered rhonchi no rales no wheezes appreciable heart irregular regular no S3 no heaves thrills or rubs   Investigations:  Recent Results (from the past 240 hour(s))  Culture, blood (routine x 2)     Status: None (Preliminary result)   Collection Time: 04/18/16  3:30 PM  Result Value Ref Range Status   Specimen Description BLOOD RIGHT HAND  Final   Special Requests BOTTLES DRAWN AEROBIC AND ANAEROBIC 6CC EACH  Final   Culture NO GROWTH 3 DAYS  Final   Report Status PENDING  Incomplete  Culture, blood (routine x 2)     Status: None (Preliminary result)   Collection Time: 04/18/16  3:38 PM  Result Value Ref Range Status   Specimen Description LEFT ANTECUBITAL  Final   Special Requests BOTTLES DRAWN AEROBIC AND ANAEROBIC 6CC EACH  Final   Culture NO GROWTH 3 DAYS  Final   Report Status PENDING  Incomplete  Urine culture     Status: None   Collection Time: 04/18/16  3:40 PM  Result Value Ref Range Status   Specimen Description URINE, CATHETERIZED  Final   Special Requests Normal  Final   Culture   Final    NO GROWTH 2 DAYS Performed at [facility]    Report Status 04/20/2016 FINAL  Final     Basic Metabolic Panel:  Recent Labs  95/62/1304/29/17 0613 04/21/16 0521  NA 134* 136  K 4.6 4.0  CL 105 102  CO2 18* 24  GLUCOSE 243* 130*  BUN 25* 28*   CREATININE 1.22 1.11  CALCIUM 9.0 8.9   Liver Function Tests:  Recent Labs  04/19/16 0613  AST 89*  ALT 37  ALKPHOS 81  BILITOT 0.8  PROT 7.4  ALBUMIN 3.3*     CBC:  Recent Labs  04/19/16 0613 04/21/16 0521  WBC 20.6* 19.9*  NEUTROABS  --  16.1*  HGB 14.2 13.3  HCT 41.9 39.5  MCV 91.1 91.6  PLT 222 202    Dg Chest Port 1 View  04/21/2016  CLINICAL DATA:  Preop.  Altered mental status. EXAM: PORTABLE CHEST 1 VIEW COMPARISON:  04/18/2016 FINDINGS: Changes from CABG surgery are stable. Cardiac silhouette is normal in size. No mediastinal or hilar masses or evidence of adenopathy. Allowing for the semi-erect positioning and some rotation to the right, and the AP technique, the lungs are clear. No pleural effusion or pneumothorax. Bony thorax is demineralized but grossly intact. IMPRESSION: No active disease. Electronically Signed   By: Amie Portlandavid  Ormond M.D.   On: 04/21/2016 11:23      Medications:   Impression:  Principal Problem:   Osteomyelitis of ankle (HCC) Active Problems:   Diabetes (HCC)   Hyperlipidemia   Atrial fibrillation (HCC)   Chronic combined systolic and diastolic CHF, NYHA class 3 (HCC)   AKI (acute kidney injury) (HCC)   Cellulitis   Osteomyelitis (HCC)  Pressure ulcer     Plan: Total anticoagulants on hold to new IV antibiotics at present and opioid analgesia in anticipation of surgical debridement  Consultants: Cardiology and orthopedic surgery   Procedures   Antibiotics: Vancomycin                  Code Status:  Family Communication:    Disposition Plan see plan above  Time spent: 30 minutes   LOS: 3 days   Jorene Kaylor M   04/21/2016, 1:28 PM

## 2016-04-21 NOTE — Progress Notes (Signed)
Pharmacy Antibiotic Note  [name] is a 70 y.o. male admitted on 04/18/2016 with cellulitis, osteomyelitis.  Pharmacy has been consulted for vancomycin dosing.Vancomycin 1gm ordered in ED Vancomycin trough reported as 3513mcg/ml. Goal trough 15-7120mcg/ml. Will adjust dose.  Awaiting cardiology consult for clearance to have a wound debridement of his left ankle. (Patient is on dabigatran and plavix.)  Plan: Increase Vancomycin 750 mg IV q12 hours (goal trough 15-5820mcg/ml) F/U levels as warranted  Height: 5\' 9"  (175.3 cm) Weight: 152 lb 1.6 oz (68.992 kg) IBW/kg (Calculated) : 70.7  Temp (24hrs), Avg:98 F (36.7 C), Min:97 F (36.1 C), Max:98.6 F (37 C)   Recent Labs Lab 04/18/16 1122 04/19/16 0613 04/21/16 0521  WBC 18.7* 20.6* 19.9*  CREATININE 1.58* 1.22 1.11  VANCOTROUGH  --   --  13    Estimated Creatinine Clearance: 61.3 mL/min (by C-G formula based on Cr of 1.11).    No Known Allergies  Antimicrobials this admission: vanc 4/28 >>   Cultures: 4/28 Blood cultures>> ngtd 4/28 Urine culture>> negative   Thank you for allowing pharmacy to be a part of this patient's care.  Elder CyphersLorie Caylin Raby, BS Pharm D, [place] Clinical Pharmacist Pager [phone] 04/21/2016 8:43 AM

## 2016-04-21 NOTE — Progress Notes (Signed)
Orthopedic follow-up  I reviewed cardiology's note. The patient will have to be off of the pradaxa for 3 days which would include Monday Tuesday and Wednesday and then he can have surgical debridement and culture on Thursday.  However, I will be leaving town for the weekend and cannot do the surgery on Thursday if that was enough time it's probably more like Friday if we stopped his medication today  I will alert Dr. Delbert Harnesson Diego   I will see him at 6 pm

## 2016-04-22 LAB — BASIC METABOLIC PANEL
ANION GAP: 9 (ref 5–15)
BUN: 24 mg/dL — AB (ref 6–20)
CALCIUM: 8.5 mg/dL — AB (ref 8.9–10.3)
CO2: 27 mmol/L (ref 22–32)
Chloride: 101 mmol/L (ref 101–111)
Creatinine, Ser: 1.21 mg/dL (ref 0.61–1.24)
GFR calc Af Amer: >60 mL/min (ref >60)
GFR, EST NON AFRICAN AMERICAN: 59 mL/min — AB (ref >60)
GLUCOSE: 74 mg/dL (ref 65–99)
Potassium: 3.5 mmol/L (ref 3.5–5.1)
Sodium: 137 mmol/L (ref 135–145)

## 2016-04-22 LAB — GLUCOSE, CAPILLARY
GLUCOSE-CAPILLARY: 241 mg/dL — AB (ref 65–99)
GLUCOSE-CAPILLARY: 283 mg/dL — AB (ref 65–99)
Glucose-Capillary: 77 mg/dL (ref 65–99)
Glucose-Capillary: 87 mg/dL (ref 65–99)
Glucose-Capillary: 214 mg/dL — ABNORMAL HIGH (ref 65–99)

## 2016-04-22 LAB — CBC WITH DIFFERENTIAL/PLATELET
BASOS ABS: 0.1 10*3/uL (ref 0.0–0.1)
Basophils Relative: 0 %
EOS PCT: 1 %
Eosinophils Absolute: 0.1 10*3/uL (ref 0.0–0.7)
HEMATOCRIT: 40 % (ref 39.0–52.0)
Hemoglobin: 13.2 g/dL (ref 13.0–17.0)
LYMPHS PCT: 12 %
Lymphs Abs: 2 10*3/uL (ref 0.7–4.0)
MCH: 30.4 pg (ref 26.0–34.0)
MCHC: 33 g/dL (ref 30.0–36.0)
MCV: 92.2 fL (ref 78.0–100.0)
MONO ABS: 1.4 10*3/uL — AB (ref 0.1–1.0)
MONOS PCT: 8 %
NEUTROS ABS: 14 10*3/uL — AB (ref 1.7–7.7)
Neutrophils Relative %: 79 %
PLATELETS: 222 10*3/uL (ref 150–400)
RBC: 4.34 MIL/uL (ref 4.22–5.81)
RDW: 13.6 % (ref 11.5–15.5)
WBC: 17.6 10*3/uL — ABNORMAL HIGH (ref 4.0–10.5)

## 2016-04-22 NOTE — Clinical Social Work Note (Signed)
Clinical Social Work Assessment  Patient Details  Name: [name] MRN: [id number] Date of Birth: [birth year]  Date of referral:  04/22/16               Reason for consult:  Discharge Planning                Permission sought to share information with:    Permission granted to share information::     Name::        Agency::     Relationship::     Contact Information:     Housing/Transportation Living arrangements for the past 2 months:  Single Family Home Source of Information:  Spouse Patient Interpreter Needed:  None Criminal Activity/Legal Involvement Pertinent to Current Situation/Hospitalization:  No - Comment as needed Significant Relationships:  Adult Children, Spouse Lives with:  Spouse Do you feel safe going back to the place where you live?   (after completion of IV antibiotics) Need for family participation in patient care:  Yes (Comment)  Care giving concerns:  [name] does not believe that they could manage IV antibiotics at home.    Social Worker assessment / plan:  CSW attempted to meet with pt at bedside, but pt sleeping. CSW spoke with pt's daughter, [name] on phone as CSW was originally unable to reach wife. [name] felt that SNF would be best option for her father, but defers to pt's wife. CSW was able to speak with her on phone this afternoon and she also does not feel comfortable handling IV antibiotics at home. CSW discussed placement process, including Medicare coverage/criteria. She requests [facility] SNF only. Per MD, pt will need to return for debridement next week.   Employment status:  Retired Health and safety inspectornsurance information:  Medicare PT Recommendations:  Not assessed at this time Information / Referral to community resources:  Skilled Nursing Facility  Patient/Family's Response to care:  [name] agreeable [place].   Patient/Family's Understanding of and Emotional Response to Diagnosis, Current Treatment, and Prognosis:  [name] is aware of  plan for long term IV antibiotics and agrees SNF is best option.   Emotional Assessment Appearance:  Appears stated age Attitude/Demeanor/Rapport:  Unable to Assess Affect (typically observed):  Unable to Assess Orientation:    Alcohol / Substance use:  Not Applicable Psych involvement (Current and /or in the community):  No (Comment)  Discharge Needs  Concerns to be addressed:  Discharge Planning Concerns Readmission within the last 30 days:  No Current discharge risk:  None Barriers to Discharge:  Continued Medical Work up   Kersey Northern Santa FeStultz, Vincent Croston Shanaberger, LCSW 04/22/2016, 3:29 PM [phone]

## 2016-04-22 NOTE — Clinical Social Work Placement (Signed)
   CLINICAL SOCIAL WORK PLACEMENT  NOTE  Date:  04/22/2016  Patient Details  Name: Cala Bradfordugene F Colvin MRN: [id number] Date of Birth: 1946/02/25  Clinical Social Work is seeking post-discharge placement for this patient at the Skilled  Nursing Facility level of care (*CSW will initial, date and re-position this form in  chart as items are completed):  Yes   Patient/family provided with [facility] Clinical Social Work Department's list of facilities offering this level of care within the geographic area requested by the patient (or if unable, by the patient's family).  Yes   Patient/family informed of their freedom to choose among providers that offer the needed level of care, that participate in Medicare, Medicaid or managed care program needed by the patient, have an available bed and are willing to accept the patient.  Yes   Patient/family informed of [facility]'s ownership interest in [facility] and [facility], as well as of the fact that they are under no obligation to receive care at these facilities.  PASRR submitted to EDS on 04/22/16     PASRR number received on 04/22/16     Existing PASRR number confirmed on       FL2 transmitted to all facilities in geographic area requested by pt/family on 04/22/16     FL2 transmitted to all facilities within larger geographic area on       Patient informed that his/her managed care company has contracts with or will negotiate with certain facilities, including the following:            Patient/family informed of bed offers received.  Patient chooses bed at       Physician recommends and patient chooses bed at      Patient to be transferred to   on  .  Patient to be transferred to facility by       Patient family notified on   of transfer.  Name of family member notified:        PHYSICIAN       Additional Comment:    _______________________________________________ Karn CassisStultz, Tramayne Sebesta Shanaberger, LCSW 04/22/2016, 3:27  PM [phone]

## 2016-04-22 NOTE — [place] FL2 (Signed)
MEDICAID FL2 LEVEL OF CARE SCREENING TOOL     IDENTIFICATION  Patient Name: [name] Birthdate: 05-03-1946 Sex: male Admission Date (Current Location): 04/18/2016  [facility] and [place] Number:  [employer] and Address:  [facility],  [address], [place] [address]      Provider Number: [id number]  Attending Physician Name and Address:  Oval Linsey, MD  Relative Name and Phone Number:       Current Level of Care: Hospital Recommended Level of Care: Skilled Nursing Facility Prior Approval Number:    Date Approved/Denied:   PASRR Number: [id number]  Discharge Plan: SNF    Current Diagnoses: Patient Active Problem List   Diagnosis Date Noted  . Pressure ulcer 04/19/2016  . Osteomyelitis of ankle (HCC) 04/18/2016  . Cellulitis 04/18/2016  . Osteomyelitis (HCC) 04/18/2016  . Hypoglycemia 12/12/2015  . SIRS (systemic inflammatory response syndrome) (HCC) 12/12/2015  . Hypothermia 12/12/2015  . Hypotension 12/12/2015  . AKI (acute kidney injury) (HCC)   . Acute combined systolic and diastolic congestive heart failure (HCC) 08/09/2015  . Troponin I above reference range 08/09/2015  . Ischemic chest pain (HCC) 08/09/2015  . Chest pain, rule out acute myocardial infarction 06/11/2014  . Syncope 06/07/2014  . Hypotension due to drugs 05/04/2014  . Syncope and collapse 05/04/2014  . DKA, type 2 (HCC) 04/05/2014  . DKA (diabetic ketoacidoses) (HCC) 04/05/2014  . Chronic combined systolic and diastolic CHF, NYHA class 3 (HCC) 04/05/2014  . ARF (acute renal failure) (HCC) 03/19/2014  . Hyperkalemia 03/18/2014  . Non-compliance   . Chest pain 09/06/2013  . Acute on chronic systolic heart failure (HCC) 09/06/2013  . Chronic anticoagulation 04/13/2013  . History of diagnostic tests 04/13/2013  . Fall 04/13/2013  . Arteriosclerotic cardiovascular disease (ASCVD)   . Atrial fibrillation (HCC)   . DDD (degenerative disc  disease)   . Alcohol use (HCC)   . Diabetes (HCC) 05/06/2010  . Hyperlipidemia 05/06/2010  . TOBACCO ABUSE 05/06/2010  . PERIPHERAL VASCULAR DISEASE 05/06/2010    Orientation RESPIRATION BLADDER Height & Weight     Self, Place  Normal Incontinent Weight: 152 lb 1.6 oz (68.992 kg) Height:   (175.3 cm)  BEHAVIORAL SYMPTOMS/MOOD NEUROLOGICAL BOWEL NUTRITION STATUS  Other (Comment) (n/a)  (n/a) Incontinent Diet (heart healthy)  AMBULATORY STATUS COMMUNICATION OF NEEDS Skin   Extensive Assist Verbally Other (Comment) (Stage 1 to left hip. Stage 3 to left ankle. )                       Personal Care Assistance Level of Assistance  Bathing, Feeding, Dressing Bathing Assistance: Maximum assistance Feeding assistance: Maximum assistance Dressing Assistance: Maximum assistance     Functional Limitations Info  Sight, Hearing, Speech Sight Info: Adequate Hearing Info: Adequate Speech Info: Adequate    SPECIAL CARE FACTORS FREQUENCY                       Contractures Contractures Info: Not present    Additional Factors Info  Insulin Sliding Scale Code Status Info: Full code Allergies Info: No known allergies           Current Medications (04/22/2016):  This is the current hospital active medication list Current Facility-Administered Medications  Medication Dose Route Frequency Provider Last Rate Last Dose  . albuterol (PROVENTIL) (2.5 MG/3ML) 0.083% nebulizer solution 2.5 mg  2.5 mg Nebulization Q6H PRN Oval Linsey, MD      .  aspirin chewable tablet 81 mg  81 mg Oral Daily Jodelle GrossKathryn M Lawrence, NP   81 mg at 04/22/16 [id number]  . carvedilol (COREG) tablet 3.125 mg  3.125 mg Oral BID Jerald KiefStephen K Chiu, MD   3.125 mg at 04/22/16 0840  . digoxin (LANOXIN) tablet 125 mcg  125 mcg Oral Daily Jerald KiefStephen K Chiu, MD   125 mcg at 04/22/16 0840  . furosemide (LASIX) tablet 40 mg  40 mg Oral Q48H Carylon Perchesoy Fagan, MD   40 mg at 04/21/16 0829  . insulin aspart (novoLOG) injection 0-15  Units  0-15 Units Subcutaneous TID WC Jerald KiefStephen K Chiu, MD   5 Units at 04/22/16 1115  . insulin detemir (LEVEMIR) injection 20 Units  20 Units Subcutaneous Daily Carylon Perchesoy Fagan, MD   20 Units at 04/22/16 [phone]  . isosorbide mononitrate (IMDUR) 24 hr tablet 30 mg  30 mg Oral Daily Carylon Perchesoy Fagan, MD   30 mg at 04/22/16 [phone]  . lisinopril (PRINIVIL,ZESTRIL) tablet 5 mg  5 mg Oral Daily Carylon Perchesoy Fagan, MD   5 mg at 04/22/16 [phone]  . ranolazine (RANEXA) 12 hr tablet 1,000 mg  1,000 mg Oral BID Jerald KiefStephen K Chiu, MD   1,000 mg at 04/22/16 0839  . rosuvastatin (CRESTOR) tablet 40 mg  40 mg Oral Daily Jerald KiefStephen K Chiu, MD   40 mg at 04/22/16 0839  . sodium chloride 0.9 % bolus 1,000 mL  1,000 mL Intravenous Once Bethann BerkshireJoseph Zammit, MD      . vancomycin (VANCOCIN) IVPB 750 mg/150 ml premix  750 mg Intravenous Q12H Oval Linseyichard Dondiego, MD   750 mg at 04/22/16 [id number]     Discharge Medications: Please see discharge summary for a list of discharge medications.  Relevant Imaging Results:  Relevant Lab Results:   Additional Information SS#: [id number]. PICC line for IV antibiotics.   Derenda FennelStultz, Loyal Holzheimer Jerico Springs[place]anaberger, [place]CS[place] [phone]

## 2016-04-22 NOTE — Progress Notes (Signed)
Primary cardiologist: Dr, Dina Rich  Seen for followup: Preoperative assessment  Subjective:    Left lower leg pain somewhat better. No chest pain or dyspnea. No palpitations.  Objective:   Temp:  [97.5 F (36.4 C)-98.1 F (36.7 C)] 98.1 F (36.7 C) (05/02 0626) Pulse Rate:  [73-82] 82 (05/02 0626) Resp:  [20] 20 (05/02 0626) BP: (108-121)/(61-73) 108/64 mmHg (05/02 0626) SpO2:  [98 %-100 %] 100 % (05/02 0626) Last BM Date: 04/21/16  Filed Weights   04/18/16 0957 04/18/16 1813  Weight: 150 lb (68.04 kg) 152 lb 1.6 oz (68.992 kg)    Intake/Output Summary (Last 24 hours) at 04/22/16 0823 Last data filed at 04/21/16 1730  Gross per 24 hour  Intake      0 ml  Output      0 ml  Net      0 ml    Telemetry: Atrial fibrillation.  Exam:  General: Chronically ill appearing male in no distress.  Lungs: Diminished breath sounds.  Cardiac: Irregularly irregular.  Extremities: Left leg dressed.  Lab Results:  Basic Metabolic Panel:  Recent Labs Lab 04/19/16 0613 04/21/16 0521 04/22/16 0513  NA 134* 136 137  K 4.6 4.0 3.5  CL 105 102 101  CO2 18* 24 27  GLUCOSE 243* 130* 74  BUN 25* 28* 24*  CREATININE 1.22 1.11 1.21  CALCIUM 9.0 8.9 8.5*    Liver Function Tests:  Recent Labs Lab 04/18/16 1122 04/19/16 0613  AST 93* 89*  ALT 29 37  ALKPHOS 84 81  BILITOT 0.7 0.8  PROT 8.3* 7.4  ALBUMIN 3.9 3.3*    CBC:  Recent Labs Lab 04/19/16 0613 04/21/16 0521 04/22/16 0513  WBC 20.6* 19.9* 17.6*  HGB 14.2 13.3 13.2  HCT 41.9 39.5 40.0  MCV 91.1 91.6 92.2  PLT 222 202 222   Chest x-ray 04/21/2016: FINDINGS: Changes from CABG surgery are stable. Cardiac silhouette is normal in size. No mediastinal or hilar masses or evidence of adenopathy.  Allowing for the semi-erect positioning and some rotation to the right, and the AP technique, the lungs are clear. No pleural effusion or pneumothorax.  Bony thorax is demineralized but grossly  intact.  IMPRESSION: No active disease.  Medications:   Scheduled Medications: . aspirin  81 mg Oral Daily  . carvedilol  3.125 mg Oral BID  . digoxin  125 mcg Oral Daily  . furosemide  40 mg Oral Q48H  . insulin aspart  0-15 Units Subcutaneous TID WC  . insulin detemir  20 Units Subcutaneous Daily  . isosorbide mononitrate  30 mg Oral Daily  . lisinopril  5 mg Oral Daily  . ranolazine  1,000 mg Oral BID  . rosuvastatin  40 mg Oral Daily  . sodium chloride  1,000 mL Intravenous Once  . vancomycin  750 mg Intravenous Q12H     PRN Medications:  albuterol   Assessment:   1. Preoperative evaluation prior to planned debridement left leg under general anesthesia. Overall intermediate risk from cardiac perspective. He was taken off Plavix and Pradaxa yesterday (5/1) in anticipation of operation in next three or four days. Otherwise remains on Coreg, Digoxin, Lisinopril, Ranexa, Lasix, and Zocor.   2. Ischemic cardiomyopathy with LVEF 40-45%. 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 No angina at present.  3. Chronic atrial fibrillation. Rate control adequate. Holding anticoagulation as noted above.  4. Left lateral malleolus osteomyelitis, per  primary team.   Plan/Discussion:    No change to current cardiac regimen.   Jonelle SidleSamuel G. Traeger Sultana, M.D., F.A.C.C.

## 2016-04-22 NOTE — Progress Notes (Signed)
Appreciate Dr. Mort Sawyers and McDowell's notes and off both anticoagulants 36 hours saturation given to transfer to skilled nursing facility until Monday or Tuesday and then readmittance for debridement of left malleolus continuation of IV antibiotics until that time [name] JYN:[id number] DOB: 01-28-1946 DOA: 04/18/2016 PCP: [name], MD             Physical Exam: Blood pressure 111/74, pulse 78, temperature 98 F (36.7 C), temperature source Oral, resp. rate 20, height  (1.753 m), weight 152 lb 1.6 oz (68.992 kg), SpO2 99 %. lungs show prolonged respiratory phase scattered rhonchi no rales or wheezes appreciable heart irregular rhythm no S3 no heaves or thrills or rubs   Investigations:  Recent Results (from the past 240 hour(s))  Culture, blood (routine x 2)     Status: None (Preliminary result)   Collection Time: 04/18/16  3:30 PM  Result Value Ref Range Status   Specimen Description BLOOD RIGHT HAND  Final   Special Requests BOTTLES DRAWN AEROBIC AND ANAEROBIC 6CC EACH  Final   Culture NO GROWTH 4 DAYS  Final   Report Status PENDING  Incomplete  Culture, blood (routine x 2)     Status: None (Preliminary result)   Collection Time: 04/18/16  3:38 PM  Result Value Ref Range Status   Specimen Description LEFT ANTECUBITAL  Final   Special Requests BOTTLES DRAWN AEROBIC AND ANAEROBIC 6CC EACH  Final   Culture NO GROWTH 4 DAYS  Final   Report Status PENDING  Incomplete  Urine culture     Status: None   Collection Time: 04/18/16  3:40 PM  Result Value Ref Range Status   Specimen Description URINE, CATHETERIZED  Final   Special Requests Normal  Final   Culture   Final    NO GROWTH 2 DAYS Performed at [facility]    Report Status 04/20/2016 FINAL  Final     Basic Metabolic Panel:  Recent Labs  86/57/84 0521 04/22/16 0513  NA 136 137  K 4.0 3.5  CL 102 101  CO2 24 27  GLUCOSE 130* 74  BUN 28* 24*  CREATININE 1.11 1.21  CALCIUM 8.9  8.5*   Liver Function Tests: No results for input(s): AST, ALT, ALKPHOS, BILITOT, PROT, ALBUMIN in the last 72 hours.   CBC:  Recent Labs  04/21/16 0521 04/22/16 0513  WBC 19.9* 17.6*  NEUTROABS 16.1* 14.0*  HGB 13.3 13.2  HCT 39.5 40.0  MCV 91.6 92.2  PLT 202 222    Dg Chest Port 1 View  04/21/2016  CLINICAL DATA:  Preop.  Altered mental status. EXAM: PORTABLE CHEST 1 VIEW COMPARISON:  04/18/2016 FINDINGS: Changes from CABG surgery are stable. Cardiac silhouette is normal in size. No mediastinal or hilar masses or evidence of adenopathy. Allowing for the semi-erect positioning and some rotation to the right, and the AP technique, the lungs are clear. No pleural effusion or pneumothorax. Bony thorax is demineralized but grossly intact. IMPRESSION: No active disease. Electronically Signed   By: Amie Portland M.D.   On: 04/21/2016 11:23      Medications:   Impression:  Principal Problem:   Osteomyelitis of ankle (HCC) Active Problems:   Diabetes (HCC)   Hyperlipidemia   Atrial fibrillation (HCC)   Chronic combined systolic and diastolic CHF, NYHA class 3 (HCC)   AKI (acute kidney injury) (HCC)   Cellulitis   Osteomyelitis (HCC)   Pressure ulcer     Plan: Consider transfer to skilled nursing facility  until Monday or Tuesday with continuation of IV antibiotics under protocol and subsequent readmittance for debridement of lateral malleolus   Consultants: Cardiology and orthopedic surgery    Procedures   Antibiotics: Vancomycin                   Code Status:  Family Communication:  We'll discuss options with family  Disposition Plan see plan above  Time spent: 30 minutes   LOS: 4 days   [name] M   04/22/2016, 1:19 PM

## 2016-04-23 ENCOUNTER — Inpatient Hospital Stay (HOSPITAL_COMMUNITY): Payer: Medicare Other

## 2016-04-23 LAB — GLUCOSE, CAPILLARY
GLUCOSE-CAPILLARY: 252 mg/dL — AB (ref 65–99)
GLUCOSE-CAPILLARY: 217 mg/dL — AB (ref 65–99)
Glucose-Capillary: 162 mg/dL — ABNORMAL HIGH (ref 65–99)

## 2016-04-23 LAB — BASIC METABOLIC PANEL
Anion gap: 9 (ref 5–15)
BUN: 26 mg/dL — ABNORMAL HIGH (ref 6–20)
CO2: 25 mmol/L (ref 22–32)
Calcium: 8.3 mg/dL — ABNORMAL LOW (ref 8.9–10.3)
Chloride: 102 mmol/L (ref 101–111)
Creatinine, Ser: 1.09 mg/dL (ref 0.61–1.24)
GFR calc Af Amer: >60 mL/min (ref >60)
GFR calc non Af Amer: >60 mL/min (ref >60)
Glucose, Bld: 144 mg/dL — ABNORMAL HIGH (ref 65–99)
Potassium: 3.7 mmol/L (ref 3.5–5.1)
Sodium: 136 mmol/L (ref 135–145)

## 2016-04-23 LAB — CBC WITH DIFFERENTIAL/PLATELET
Basophils Absolute: 0 10*3/uL (ref 0.0–0.1)
Basophils Relative: 0 %
Eosinophils Absolute: 0.1 10*3/uL (ref 0.0–0.7)
Eosinophils Relative: 1 %
HCT: 38.3 % — ABNORMAL LOW (ref 39.0–52.0)
Hemoglobin: 12.8 g/dL — ABNORMAL LOW (ref 13.0–17.0)
Lymphocytes Relative: 8 %
Lymphs Abs: 1.4 10*3/uL (ref 0.7–4.0)
MCH: 30.4 pg (ref 26.0–34.0)
MCHC: 33.4 g/dL (ref 30.0–36.0)
MCV: 91 fL (ref 78.0–100.0)
Monocytes Absolute: 1.3 10*3/uL — ABNORMAL HIGH (ref 0.1–1.0)
Monocytes Relative: 8 %
Neutro Abs: 14.1 10*3/uL — ABNORMAL HIGH (ref 1.7–7.7)
Neutrophils Relative %: 83 %
Platelets: 232 10*3/uL (ref 150–400)
RBC: 4.21 MIL/uL — ABNORMAL LOW (ref 4.22–5.81)
RDW: 13.2 % (ref 11.5–15.5)
WBC: 17 10*3/uL — ABNORMAL HIGH (ref 4.0–10.5)

## 2016-04-23 LAB — CULTURE, BLOOD (ROUTINE X 2)
CULTURE: NO GROWTH
Culture: NO GROWTH

## 2016-04-23 MED ORDER — DABIGATRAN ETEXILATE MESYLATE 150 MG PO CAPS
150.0000 mg | ORAL_CAPSULE | Freq: Two times a day (BID) | ORAL | Status: DC
  Administered 2016-04-23: 150 mg via ORAL
  Filled 2016-04-23 (×5): qty 1

## 2016-04-23 MED ORDER — DABIGATRAN ETEXILATE MESYLATE 150 MG PO CAPS: 150.0000 mg | ORAL_CAPSULE | Freq: Two times a day (BID) | ORAL | Status: DC

## 2016-04-23 MED ORDER — RANOLAZINE ER 1000 MG PO TB12: 1000.0000 mg | ORAL_TABLET | Freq: Two times a day (BID) | ORAL | Status: AC

## 2016-04-23 MED ORDER — ISOSORBIDE MONONITRATE ER 30 MG PO TB24: 30.0000 mg | ORAL_TABLET | Freq: Every day | ORAL | Status: AC

## 2016-04-23 MED ORDER — VANCOMYCIN HCL IN DEXTROSE 750-5 MG/150ML-% IV SOLN: 750.0000 mg | Freq: Two times a day (BID) | INTRAVENOUS | Status: AC

## 2016-04-23 NOTE — Progress Notes (Signed)
Inpatient Diabetes Program Recommendations  AACE/ADA: New Consensus Statement on Inpatient Glycemic Control (2015)  Target Ranges:  Prepandial:   less than 140 mg/dL      Peak postprandial:   less than 180 mg/dL (1-2 hours)      Critically ill patients:  140 - 180 mg/dL  Results for [name], [name] (MRN [id number]) as of 04/23/2016 09:29  Ref. Range 04/22/2016 07:14 04/22/2016 11:07 04/22/2016 16:11 04/22/2016 20:51 04/23/2016 07:08  Glucose-Capillary Latest Ref Range: 65-99 mg/dL 87 696241 (H) 295283 (H) 284214 (H) 162 (H)   Review of Glycemic Control  Diabetes history: DM2 Outpatient Diabetes medications: Levemir 40-50 units daily, Novolog 20 units TID with meals Current orders for Inpatient glycemic control: Levemir 20 units daily, Novolog 0-15 units TID with meals   Inpatient Diabetes Program Recommendations: Correction (SSI): Please consider ordering Novolog BEDTIME correction scale. Insulin - Meal Coverage: Post prandial glucose is consistently elevated. Please consider ordering Novolog 4 units TID with meals for meal coverage.  Thanks, Orlando PennerMarie Vianey Caniglia, RN, MSN, CDE Diabetes Coordinator Inpatient Diabetes Program [phone] (Team Pager from 8am to 5pm) [phone] (AP office) [phone] [facility] office) [phone] [facility] office)

## 2016-04-23 NOTE — Progress Notes (Signed)
     Primary cardiologist: Dr. Dina RichJonathan [name]  Subjective:  Complains of stomach pain today as he eats. No cardiac complaints.  Objective:  Vital Signs in the last 24 hours: Temp:  [98 F (36.7 C)-98.2 F (36.8 C)] 98.2 F (36.8 C) (05/03 0649) Pulse Rate:  [70-78] 70 (05/03 0649) Resp:  [18-20] 18 (05/03 0649) BP: (111-127)/(61-74) 127/72 mmHg (05/03 0649) SpO2:  [98 %-99 %] 98 % (05/03 0649)  Intake/Output from previous day: 05/02 0701 - 05/03 0700 In: 600 [P.O.:600] Out: -  Intake/Output from this shift:    Physical Exam: NECK: Without JVD, HJR, or bruit LUNGS: Decreased breath sounds but Clear  HEART: Irregular rate and rhythm, no murmur, gallop, rub, bruit, thrill, or heave EXTREMITIES: Without cyanosis, clubbing, or edema  Lab Results:  Recent Labs  04/22/16 0513 04/23/16 0545  WBC 17.6* 17.0*  HGB 13.2 12.8*  PLT 222 232    Recent Labs  04/22/16 0513 04/23/16 0545  NA 137 136  K 3.5 3.7  CL 101 102  CO2 27 25  GLUCOSE 74 144*  BUN 24* 26*  CREATININE 1.21 1.09   Assessment/Plan:   1. Preoperative evaluation prior to planned debridement left leg under general anesthesia. Overall intermediate risk from cardiac perspective. He was taken off Plavix and Pradaxa 5/1 in anticipation of operation in next three or four days. Now surgery scheduled for 05/01/16 as outpatient. Last dose of anticoagulation to be 04/27/16. Have reordered Pradaxa, last dose on 04/27/16. Stopped ASA and will leave off Plavix as well after discussing with Dr. Diona BrownerMcDowell. Otherwise remains on Coreg, Digoxin, Lisinopril, Ranexa, Lasix, and Zocor.   2. Ischemic cardiomyopathy with LVEF 40-45%. 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 No angina at present.  3. Chronic atrial fibrillation. Rate control adequate. Holding anticoagulation as noted above.  4. Left lateral malleolus osteomyelitis, per  primary team.   LOS: 5 days   Herma CarsonMichelle Lenze PA-C 04/23/2016, 9:08 AM  Attending note:  Patient seen and examined. Agree with above assessment by Ms. Geni BersLenze PA-C. Chart indicates plan for left leg debridement on May 11 as outpatient. Would therefore leave off Plavix for now, resume Pradaxa until May 7 then D/C in preparation for surgery. Continue other baseline cardiac regimen.  Jonelle SidleSamuel G. McDowell, M.D., F.A.C.C.

## 2016-04-23 NOTE — Care Management Note (Signed)
Case Management Note  Patient Details  Name: [name] MRN: [id number] Date of Birth: June 03, 1946  Subjective/Objective:                  Pt is from home with wife. Pt with osteo, will need IV abx at DC. Pt will need SNF placement for IV abx. CSW is aware of placement need and has started placement process.   Action/Plan: Anticipate DC today. No CM needs.   Expected Discharge Date:      04/23/2016            Expected Discharge Plan:  Skilled Nursing Facility  In-House Referral:  NA  Discharge planning Services  CM Consult  Post Acute Care Choice:  NA Choice offered to:  NA  DME Arranged:    DME Agency:     HH Arranged:    HH Agency:     Status of Service:  Completed, signed off  Medicare Important Message Given:  Yes Date Medicare IM Given:    Medicare IM give by:    Date Additional Medicare IM Given:    Additional Medicare Important Message give by:     If discussed at Long Length of Stay Meetings, dates discussed:    Additional Comments:  Malcolm MetroChildress, Aune Adami Demske, RN 04/23/2016, 12:59 PM

## 2016-04-23 NOTE — Clinical Social Work Placement (Signed)
   CLINICAL SOCIAL WORK PLACEMENT  NOTE  Date:  04/23/2016  Patient Details  Name: [name] MRN: [id number] Date of Birth: 1946/09/29  Clinical Social Work is seeking post-discharge placement for this patient at the Skilled  Nursing Facility level of care (*CSW will initial, date and re-position this form in  chart as items are completed):  Yes   Patient/family provided with [facility] Clinical Social Work Department's list of facilities offering this level of care within the geographic area requested by the patient (or if unable, by the patient's family).  Yes   Patient/family informed of their freedom to choose among providers that offer the needed level of care, that participate in Medicare, Medicaid or managed care program needed by the patient, have an available bed and are willing to accept the patient.  Yes   Patient/family informed of [facility]'s ownership interest [facility] and [facility], as well as of the fact that they are under no obligation to receive care at these facilities.  PASRR submitted to EDS on 04/22/16     PASRR number received on 04/22/16     Existing PASRR number confirmed on       FL2 transmitted to all facilities in geographic area requested by pt/family on 04/22/16     FL2 transmitted to all facilities within larger geographic area on       Patient informed that his/her managed care company has contracts with or will negotiate with certain facilities, including the following:        Yes   Patient/family informed of bed offers received.  [facility]     Physician recommends and patient chooses bed at      [facility].  Patient to be transferred to facility by family     Patient family notified on 04/23/16 of transfer.  Name of family member notified:  Patsy- wife     PHYSICIAN       Additional Comment:     _______________________________________________ Karn CassisStultz, Dylan Ruotolo [place]aberger, LCSW 04/23/2016, 2:35 PM [phone]

## 2016-04-23 NOTE — Discharge Summary (Signed)
Physician Discharge Summary  [name] RUE:[id number] DOB: 12/04/1946 DOA: 04/18/2016  PCP: Vincent Stalling, MD  Admit date: 04/18/2016 Discharge date: 04/23/2016   Recommendations for Outpatient Follow-up:  Patient is discharged to skilled nursing facility to continue IV vancomycin 750 IV every 12 hours protocol monitoring of renal function and dosages until 511 weight will return for day surgery outpatient for debridement of left ankle malleolus and continue antibiotics for treatment of osteomyelitis the patient has per DEXA 150 by mouth twice a day resume today until May 7 were upon it will be discontinued for 3 days prior to anticipated surgery by orthopedic surgery on 511 discontinue all other outpatient oral medicines and insulin as prescribed Discharge Diagnoses:  Principal Problem:   Osteomyelitis of ankle (HCC) Active Problems:   Diabetes (HCC)   Hyperlipidemia   Atrial fibrillation (HCC)   Chronic combined systolic and diastolic CHF, NYHA class 3 (HCC)   AKI (acute kidney injury) (HCC)   Cellulitis   Osteomyelitis (HCC)   Pressure ulcer   Discharge Condition: Stable  Filed Weights   04/18/16 0957 04/18/16 1813  Weight: 150 lb (68.04 kg) 152 lb 1.6 oz (68.992 kg)    History of present illness:  70 year old white male with mild dementia history of systolic dysfunction due to ischemic cardiomyopathy with chronic atrial fibrillation on anticoagulation with Pradaxa was admitted for cellulitis and purulent drainage from left ankle malleolus radiographs indicate suspicion of osteomyelitis underlying the cellulitis. He was admitted for IV vancomycin and then will be consult obtained they agreed he needed the Department of left ankle by cardiology who discontinued Pradaxa as well as Plavix in anticipation of surgery however with orthopedic surgery will be out of town and therefore is discharged to skilled nursing facility to continue IV vancomycin until 511 when he will  return for outpatient surgery from the skilled nursing facility with Dr. [name] Canada for decades to be discontinued on 5/7 anticipation of surgery on 5/11  Hospital Course:  See history of present illness above  Procedures:    Consultations:  Cardiology and orthopedic surgery  Discharge Instructions  Discharge Instructions    Discharge instructions    Complete by:  As directed      Discharge patient    Complete by:  As directed             Medication List    STOP taking these medications        clopidogrel 75 MG tablet  Commonly known as:  PLAVIX      TAKE these medications        albuterol 108 (90 Base) MCG/ACT inhaler  Commonly known as:  PROVENTIL HFA;VENTOLIN HFA  Inhale 2 puffs into the lungs every 6 (six) hours as needed for wheezing or shortness of breath.     carvedilol 3.125 MG tablet  Commonly known as:  COREG  Take 1 tablet (3.125 mg total) by mouth 2 (two) times daily.     dabigatran 150 MG Caps capsule  Commonly known as:  PRADAXA  Take 1 capsule (150 mg total) by mouth 2 (two) times daily. Take 1 capsule poo bid until 5/7 then discontinue iin anticipation of orthopaedic surgery on 5/11     digoxin 0.125 MG tablet  Commonly known as:  LANOXIN  Take 125 mcg by mouth daily.     furosemide 40 MG tablet  Commonly known as:  LASIX  Take 1 tablet (40 mg total) by mouth every other day.  insulin aspart 100 UNIT/ML FlexPen  Commonly known as:  NOVOLOG FLEXPEN  Inject 6 Units into the skin 2 (two) times daily with a meal. 8am, 12pm, and 4pm     insulin detemir 100 UNIT/ML injection  Commonly known as:  LEVEMIR  Inject 0.2 mLs (20 Units total) into the skin daily.     isosorbide mononitrate 30 MG 24 hr tablet  Commonly known as:  IMDUR  Take 30 mg by mouth daily.     isosorbide mononitrate 30 MG 24 hr tablet  Commonly known as:  IMDUR  Take 1 tablet (30 mg total) by mouth daily.     lisinopril 5 MG tablet  Commonly known as:   PRINIVIL,ZESTRIL  Take 1 tablet (5 mg total) by mouth daily.     nitroGLYCERIN 0.4 MG SL tablet  Commonly known as:  NITROSTAT  Place 1 tablet (0.4 mg total) under the tongue every 5 (five) minutes as needed.     Oxycodone HCl 10 MG Tabs  Take 10 mg by mouth 4 (four) times daily as needed (for pain).     potassium chloride SA 20 MEQ tablet  Commonly known as:  K-DUR,KLOR-CON  TAKE ONE TABLET BY MOUTH TWICE A DAY     ranolazine 1000 MG SR tablet  Commonly known as:  RANEXA  Take 1 tablet (1,000 mg total) by mouth 2 (two) times daily.     rosuvastatin 40 MG tablet  Commonly known as:  CRESTOR  Take 40 mg by mouth daily.     Vancomycin 750-5 MG/150ML-% Soln  Commonly known as:  VANCOCIN  Inject 150 mLs (750 mg total) into the vein every 12 (twelve) hours.       No Known Allergies    The results of significant diagnostics from this hospitalization (including imaging, microbiology, ancillary and laboratory) are listed below for reference.    Significant Diagnostic Studies: Dg Chest 2 View  04/18/2016  CLINICAL DATA:  Weakness. EXAM: CHEST  2 VIEW COMPARISON:  12/12/2015 and 08/08/2015 FINDINGS: Overall heart size and pulmonary vascularity are normal and the lungs are clear of infiltrates. Slight chronic accentuation of the interstitial markings. CABG. No effusions. No acute bone abnormality. IMPRESSION: No active cardiopulmonary disease. Electronically Signed   By: Francene Boyers M.D.   On: 04/18/2016 12:59   Dg Ankle Complete Left  04/18/2016  CLINICAL DATA:  Ulceration of the lateral aspect of the left ankle. EXAM: LEFT ANKLE COMPLETE - 3+ VIEW COMPARISON:  Radiographs dated 11/30/2004 FINDINGS: There is a 2 cm soft tissue ulceration overlying the lateral malleolus. There is bone destruction of the superficial cortex of the lateral malleolus consistent with osteomyelitis. There is a small ankle effusion. There is suggestion of partial collapse of the navicular. This could  represent early Charcot change. IMPRESSION: Soft tissue ulceration and osteomyelitis of underlying distal fibula at the lateral malleolus. Small ankle effusion. 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 Electronically Signed   By: Francene Boyers M.D.   On: 04/18/2016 13:03   Ct Head Wo Contrast  04/18/2016  CLINICAL DATA:  Altered mental status.  Weakness. EXAM: CT HEAD WITHOUT CONTRAST TECHNIQUE: Contiguous axial images were obtained from the base of the skull through the vertex without intravenous contrast. COMPARISON:  12/12/2015 FINDINGS: No mass lesion. No midline shift. No acute hemorrhage or hematoma. No extra-axial fluid collections. No evidence of acute infarction. There is diffuse cerebral cortical and cerebellar atrophy. There is ventricular dilatation out proportion to the degree of atrophy suggesting  communicating hydrocephalus. Extensive periventricular white matter lucency as well as a old lacunar infarcts in the basal ganglia. IMPRESSION: No significant change since the prior study. Diffuse atrophy. Ventricular dilatation suggesting the possibility of communicating hydrocephalus. Chronic small vessel disease including old lacunar infarcts. Electronically Signed   By: Francene BoyersJames  Maxwell M.D.   On: 04/18/2016 13:06   Dg Chest Port 1 View  04/21/2016  CLINICAL DATA:  Preop.  Altered mental status. EXAM: PORTABLE CHEST 1 VIEW COMPARISON:  04/18/2016 FINDINGS: Changes from CABG surgery are stable. Cardiac silhouette is normal in size. No mediastinal or hilar masses or evidence of adenopathy. Allowing for the semi-erect positioning and some rotation to the right, and the AP technique, the lungs are clear. No pleural effusion or pneumothorax. Bony thorax is demineralized but grossly intact. IMPRESSION: No active disease. Electronically Signed   By: Amie Portlandavid  Ormond M.D.   On: 04/21/2016 11:23    Microbiology: Recent Results (from the past 240 hour(s))  Culture, blood (routine x 2)     Status:  None   Collection Time: 04/18/16  3:30 PM  Result Value Ref Range Status   Specimen Description BLOOD RIGHT HAND  Final   Special Requests BOTTLES DRAWN AEROBIC AND ANAEROBIC [facility] EACH  Final   Culture NO GROWTH 5 DAYS  Final   Report Status 04/23/2016 FINAL  Final  Culture, blood (routine x 2)     Status: None   Collection Time: 04/18/16  3:38 PM  Result Value Ref Range Status   Specimen Description BLOOD LEFT ANTECUBITAL  Final   Special Requests BOTTLES DRAWN AEROBIC AND ANAEROBIC [facility] EACH  Final   Culture NO GROWTH 5 DAYS  Final   Report Status 04/23/2016 FINAL  Final  Urine culture     Status: None   Collection Time: 04/18/16  3:40 PM  Result Value Ref Range Status   Specimen Description URINE, CATHETERIZED  Final   Special Requests Normal  Final   Culture   Final    NO GROWTH 2 DAYS [facility]    Report Status 04/20/2016 FINAL  Final     Labs: Basic Metabolic Panel:  Recent Labs Lab 04/18/16 1122 04/19/16 0613 04/21/16 0521 04/22/16 0513 04/23/16 0545  NA 136 134* 136 137 136  K 5.0 4.6 4.0 3.5 3.7  CL 104 105 102 101 102  CO2 21* 18* 24 27 25   GLUCOSE 115* 243* 130* 74 144*  BUN 28* 25* 28* 24* 26*  CREATININE 1.58* 1.22 1.11 1.21 1.09  CALCIUM 9.8 9.0 8.9 8.5* 8.3*   Liver Function Tests:  Recent Labs Lab 04/18/16 1122 04/19/16 0613  AST 93* 89*  ALT 29 37  ALKPHOS 84 81  BILITOT 0.7 0.8  PROT 8.3* 7.4  ALBUMIN 3.9 3.3*   No results for input(s): LIPASE, AMYLASE in the last 168 hours. No results for input(s): AMMONIA in the last 168 hours. CBC:  Recent Labs Lab 04/18/16 1122 04/19/16 0613 04/21/16 0521 04/22/16 0513 04/23/16 0545  WBC 18.7* 20.6* 19.9* 17.6* 17.0*  NEUTROABS 15.8*  --  16.1* 14.0* 14.1*  HGB 15.6 14.2 13.3 13.2 12.8*  HCT 45.5 41.9 39.5 40.0 38.3*  MCV 91.9 91.1 91.6 92.2 91.0  PLT 211 222 202 222 232   Cardiac Enzymes: No results for input(s): CKTOTAL, CKMB, CKMBINDEX, TROPONINI in the last 168  hours. BNP: BNP (last 3 results)  Recent Labs  08/08/15 1836 09/02/15 2334 04/18/16 1128  BNP 238.0* 83.0 233.0*  ProBNP (last 3 results) No results for input(s): PROBNP in the last 8760 hours.  CBG:  Recent Labs Lab 04/22/16 1107 04/22/16 1611 04/22/16 2051 04/23/16 0708 04/23/16 1100  GLUCAP 241* 283* 214* 162* 252*       Signed:  Farin Buhman M  [facility] Pager: [phone] 04/23/2016, 2:13 PM

## 2016-04-23 NOTE — Care Management Important Message (Signed)
Important Message  Patient Details  Name: [name] MRN: [id number] Date of Birth: 1946/03/27   Medicare Important Message Given:  Yes    Malcolm MetroChildress, Carmine Carrozza Demske, RN 04/23/2016, 12:57 PM

## 2016-04-23 NOTE — Progress Notes (Signed)
Report called to [facility] staff Ronelle NighKimberly Stuck LPN. All questions answered to her satisfaction

## 2016-04-23 NOTE — Progress Notes (Signed)
I ll put him on schedule for thurs 11 May so last dose of anticoag is 5/7 He can come in thru same day surg on the 11th  On Mon Call [phone] for preop instructions

## 2016-04-29 ENCOUNTER — Encounter (HOSPITAL_COMMUNITY)
Admission: RE | Admit: 2016-04-29 | Discharge: 2016-04-29 | Disposition: A | Discharge status: Home / Self Care | Payer: Medicare Other | Source: Ambulatory Visit | Attending: Orthopedic Surgery | Admitting: Orthopedic Surgery

## 2016-04-29 ENCOUNTER — Other Ambulatory Visit (HOSPITAL_COMMUNITY): Payer: Medicare Other

## 2016-04-29 ENCOUNTER — Encounter (HOSPITAL_COMMUNITY): Payer: Self-pay

## 2016-04-29 NOTE — Pre-Procedure Instructions (Signed)
Patient a resident at [facility]. Called [facility] and spoke with RomaniaSharonda, Associate Professorunit manager. She is going to check and make sure patient's Pradaxa was stopped on 5/7 as note states he should have. Instructed her to have him here at 0710 on 05/01/2016, NPO after midnight except for coreg, imdur,lisinopril and oxycodone(if needed), with sip of water. Have him take his inhaler before he comes and only take 1/2 of his usual insulin dosage the night before surgery, She verbalized understanding of all information. Also called patient's wife to let her know his arrival time here as she will meet him here that morning.

## 2016-05-01 ENCOUNTER — Ambulatory Visit (HOSPITAL_COMMUNITY): Payer: Medicare Other

## 2016-05-01 ENCOUNTER — Encounter (HOSPITAL_COMMUNITY): Payer: Self-pay

## 2016-05-01 ENCOUNTER — Ambulatory Visit (HOSPITAL_COMMUNITY)
Admission: RE | Admit: 2016-05-01 | Discharge: 2016-05-01 | Disposition: A | Discharge status: Nursing Home (Includes ICF) | Payer: Medicare Other | Source: Ambulatory Visit | Attending: Emergency Medicine | Admitting: Emergency Medicine

## 2016-05-01 ENCOUNTER — Other Ambulatory Visit: Payer: Self-pay

## 2016-05-01 ENCOUNTER — Encounter (HOSPITAL_COMMUNITY)
Admission: RE | Disposition: A | Discharge status: Nursing Home (Includes ICF) | Payer: Self-pay | Source: Ambulatory Visit | Attending: Emergency Medicine

## 2016-05-01 ENCOUNTER — Inpatient Hospital Stay (HOSPITAL_COMMUNITY)
Admission: EM | Admit: 2016-05-01 | Discharge: 2016-05-06 | DRG: 308 | Disposition: A | Discharge status: Skilled Nursing Facility | Payer: Medicare Other | Attending: Family Medicine | Admitting: Family Medicine

## 2016-05-01 ENCOUNTER — Emergency Department (HOSPITAL_COMMUNITY): Payer: Medicare Other

## 2016-05-01 DIAGNOSIS — E11649 Type 2 diabetes mellitus with hypoglycemia without coma: Secondary | ICD-10-CM | POA: Diagnosis present

## 2016-05-01 DIAGNOSIS — Z9049 Acquired absence of other specified parts of digestive tract: Secondary | ICD-10-CM

## 2016-05-01 DIAGNOSIS — Z91199 Patient's noncompliance with other medical treatment and regimen due to unspecified reason: Secondary | ICD-10-CM

## 2016-05-01 DIAGNOSIS — E875 Hyperkalemia: Secondary | ICD-10-CM

## 2016-05-01 DIAGNOSIS — G9340 Encephalopathy, unspecified: Secondary | ICD-10-CM | POA: Diagnosis present

## 2016-05-01 DIAGNOSIS — G903 Multi-system degeneration of the autonomic nervous system: Secondary | ICD-10-CM

## 2016-05-01 DIAGNOSIS — E162 Hypoglycemia, unspecified: Secondary | ICD-10-CM

## 2016-05-01 DIAGNOSIS — I248 Other forms of acute ischemic heart disease: Secondary | ICD-10-CM | POA: Diagnosis present

## 2016-05-01 DIAGNOSIS — R7989 Other specified abnormal findings of blood chemistry: Secondary | ICD-10-CM

## 2016-05-01 DIAGNOSIS — M869 Osteomyelitis, unspecified: Secondary | ICD-10-CM

## 2016-05-01 DIAGNOSIS — R778 Other specified abnormalities of plasma proteins: Secondary | ICD-10-CM | POA: Diagnosis present

## 2016-05-01 DIAGNOSIS — T447X5A Adverse effect of beta-adrenoreceptor antagonists, initial encounter: Secondary | ICD-10-CM | POA: Diagnosis present

## 2016-05-01 DIAGNOSIS — I739 Peripheral vascular disease, unspecified: Secondary | ICD-10-CM

## 2016-05-01 DIAGNOSIS — Z87891 Personal history of nicotine dependence: Secondary | ICD-10-CM | POA: Diagnosis not present

## 2016-05-01 DIAGNOSIS — M8660 Other chronic osteomyelitis, unspecified site: Secondary | ICD-10-CM

## 2016-05-01 DIAGNOSIS — Z9119 Patient's noncompliance with other medical treatment and regimen: Secondary | ICD-10-CM

## 2016-05-01 DIAGNOSIS — Z8249 Family history of ischemic heart disease and other diseases of the circulatory system: Secondary | ICD-10-CM

## 2016-05-01 DIAGNOSIS — E785 Hyperlipidemia, unspecified: Secondary | ICD-10-CM

## 2016-05-01 DIAGNOSIS — Z7901 Long term (current) use of anticoagulants: Secondary | ICD-10-CM | POA: Diagnosis not present

## 2016-05-01 DIAGNOSIS — E119 Type 2 diabetes mellitus without complications: Secondary | ICD-10-CM

## 2016-05-01 DIAGNOSIS — I255 Ischemic cardiomyopathy: Secondary | ICD-10-CM | POA: Diagnosis present

## 2016-05-01 DIAGNOSIS — Z951 Presence of aortocoronary bypass graft: Secondary | ICD-10-CM | POA: Diagnosis not present

## 2016-05-01 DIAGNOSIS — E1169 Type 2 diabetes mellitus with other specified complication: Secondary | ICD-10-CM | POA: Diagnosis present

## 2016-05-01 DIAGNOSIS — I482 Chronic atrial fibrillation: Secondary | ICD-10-CM | POA: Diagnosis present

## 2016-05-01 DIAGNOSIS — E1110 Type 2 diabetes mellitus with ketoacidosis without coma: Secondary | ICD-10-CM | POA: Diagnosis present

## 2016-05-01 DIAGNOSIS — A419 Sepsis, unspecified organism: Secondary | ICD-10-CM

## 2016-05-01 DIAGNOSIS — I4891 Unspecified atrial fibrillation: Secondary | ICD-10-CM

## 2016-05-01 DIAGNOSIS — I9589 Other hypotension: Secondary | ICD-10-CM | POA: Diagnosis not present

## 2016-05-01 DIAGNOSIS — L8990 Pressure ulcer of unspecified site, unspecified stage: Secondary | ICD-10-CM

## 2016-05-01 DIAGNOSIS — I5042 Chronic combined systolic (congestive) and diastolic (congestive) heart failure: Secondary | ICD-10-CM

## 2016-05-01 DIAGNOSIS — N179 Acute kidney failure, unspecified: Secondary | ICD-10-CM

## 2016-05-01 DIAGNOSIS — J449 Chronic obstructive pulmonary disease, unspecified: Secondary | ICD-10-CM | POA: Diagnosis present

## 2016-05-01 DIAGNOSIS — I4819 Other persistent atrial fibrillation: Secondary | ICD-10-CM

## 2016-05-01 DIAGNOSIS — T68XXXA Hypothermia, initial encounter: Secondary | ICD-10-CM

## 2016-05-01 DIAGNOSIS — T460X5A Adverse effect of cardiac-stimulant glycosides and drugs of similar action, initial encounter: Secondary | ICD-10-CM | POA: Diagnosis present

## 2016-05-01 DIAGNOSIS — I959 Hypotension, unspecified: Secondary | ICD-10-CM

## 2016-05-01 DIAGNOSIS — E0800 Diabetes mellitus due to underlying condition with hyperosmolarity without nonketotic hyperglycemic-hyperosmolar coma (NKHHC): Secondary | ICD-10-CM

## 2016-05-01 DIAGNOSIS — R001 Bradycardia, unspecified: Secondary | ICD-10-CM

## 2016-05-01 DIAGNOSIS — M86171 Other acute osteomyelitis, right ankle and foot: Secondary | ICD-10-CM

## 2016-05-01 LAB — CBC WITH DIFFERENTIAL/PLATELET
Basophils Absolute: 0.1 10*3/uL (ref 0.0–0.1)
Basophils Relative: 0 %
EOS PCT: 0 %
Eosinophils Absolute: 0.1 10*3/uL (ref 0.0–0.7)
HCT: 39.7 % (ref 39.0–52.0)
Hemoglobin: 12.6 g/dL — ABNORMAL LOW (ref 13.0–17.0)
LYMPHS ABS: 2.1 10*3/uL (ref 0.7–4.0)
LYMPHS PCT: 9 %
MCH: 30.4 pg (ref 26.0–34.0)
MCHC: 31.7 g/dL (ref 30.0–36.0)
MCV: 95.9 fL (ref 78.0–100.0)
MONO ABS: 1.1 10*3/uL — AB (ref 0.1–1.0)
Monocytes Relative: 5 %
Neutro Abs: 19 10*3/uL — ABNORMAL HIGH (ref 1.7–7.7)
Neutrophils Relative %: 86 %
PLATELETS: 274 10*3/uL (ref 150–400)
RBC: 4.14 MIL/uL — ABNORMAL LOW (ref 4.22–5.81)
RDW: 13.8 % (ref 11.5–15.5)
WBC: 22.3 10*3/uL — ABNORMAL HIGH (ref 4.0–10.5)

## 2016-05-01 LAB — URINE MICROSCOPIC-ADD ON: Squamous Epithelial / LPF: NONE SEEN

## 2016-05-01 LAB — BASIC METABOLIC PANEL
ANION GAP: 5 (ref 5–15)
BUN: 36 mg/dL — ABNORMAL HIGH (ref 6–20)
CO2: 26 mmol/L (ref 22–32)
Calcium: 7.5 mg/dL — ABNORMAL LOW (ref 8.9–10.3)
Chloride: 106 mmol/L (ref 101–111)
Creatinine, Ser: 1.73 mg/dL — ABNORMAL HIGH (ref 0.61–1.24)
GFR calc Af Amer: 45 mL/min — ABNORMAL LOW (ref >60)
GFR, EST NON AFRICAN AMERICAN: 39 mL/min — AB (ref >60)
GLUCOSE: 261 mg/dL — AB (ref 65–99)
POTASSIUM: 5 mmol/L (ref 3.5–5.1)
Sodium: 137 mmol/L (ref 135–145)

## 2016-05-01 LAB — URINALYSIS, ROUTINE W REFLEX MICROSCOPIC
Glucose, UA: 250 mg/dL — AB
Hgb urine dipstick: NEGATIVE
KETONES UR: NEGATIVE mg/dL
LEUKOCYTES UA: NEGATIVE
NITRITE: NEGATIVE
Specific Gravity, Urine: 1.025 (ref 1.005–1.030)
pH: 5.5 (ref 5.0–8.0)

## 2016-05-01 LAB — GLUCOSE, CAPILLARY
GLUCOSE-CAPILLARY: 55 mg/dL — AB (ref 65–99)
GLUCOSE-CAPILLARY: 177 mg/dL — AB (ref 65–99)
GLUCOSE-CAPILLARY: 187 mg/dL — AB (ref 65–99)
Glucose-Capillary: 182 mg/dL — ABNORMAL HIGH (ref 65–99)
Glucose-Capillary: 253 mg/dL — ABNORMAL HIGH (ref 65–99)
Glucose-Capillary: 255 mg/dL — ABNORMAL HIGH (ref 65–99)

## 2016-05-01 LAB — COMPREHENSIVE METABOLIC PANEL
ALBUMIN: 2.8 g/dL — AB (ref 3.5–5.0)
ALT: 18 U/L (ref 17–63)
AST: 17 U/L (ref 15–41)
Alkaline Phosphatase: 71 U/L (ref 38–126)
Anion gap: 10 (ref 5–15)
BUN: 46 mg/dL — AB (ref 6–20)
CHLORIDE: 103 mmol/L (ref 101–111)
CO2: 26 mmol/L (ref 22–32)
CREATININE: 2.43 mg/dL — AB (ref 0.61–1.24)
Calcium: 8.9 mg/dL (ref 8.9–10.3)
GFR calc Af Amer: 30 mL/min — ABNORMAL LOW (ref >60)
GFR, EST NON AFRICAN AMERICAN: 26 mL/min — AB (ref >60)
Glucose, Bld: 95 mg/dL (ref 65–99)
Potassium: 6.5 mmol/L (ref 3.5–5.1)
Sodium: 139 mmol/L (ref 135–145)
Total Bilirubin: 0.7 mg/dL (ref 0.3–1.2)
Total Protein: 7.5 g/dL (ref 6.5–8.1)

## 2016-05-01 LAB — TROPONIN I
TROPONIN I: 0.31 ng/mL — AB (ref <0.031)
Troponin I: 0.04 ng/mL — ABNORMAL HIGH (ref <0.031)
Troponin I: 0.58 ng/mL (ref <0.031)
Troponin I: 0.4 ng/mL — ABNORMAL HIGH (ref <0.031)

## 2016-05-01 LAB — I-STAT TROPONIN, ED
TROPONIN I, POC: 0.09 ng/mL — AB (ref 0.00–0.08)
Troponin i, poc: 0.03 ng/mL (ref 0.00–0.08)

## 2016-05-01 LAB — I-STAT CHEM 8, ED
BUN: 37 mg/dL — ABNORMAL HIGH (ref 6–20)
CREATININE: 2.2 mg/dL — AB (ref 0.61–1.24)
Calcium, Ion: 1.18 mmol/L (ref 1.13–1.30)
Chloride: 104 mmol/L (ref 101–111)
GLUCOSE: 221 mg/dL — AB (ref 65–99)
HCT: 34 % — ABNORMAL LOW (ref 39.0–52.0)
Hemoglobin: 11.6 g/dL — ABNORMAL LOW (ref 13.0–17.0)
Potassium: 5.5 mmol/L — ABNORMAL HIGH (ref 3.5–5.1)
SODIUM: 140 mmol/L (ref 135–145)
TCO2: 24 mmol/L (ref 0–100)

## 2016-05-01 LAB — MRSA PCR SCREENING: MRSA by PCR: NEGATIVE

## 2016-05-01 LAB — I-STAT CG4 LACTIC ACID, ED
LACTIC ACID, VENOUS: 1.31 mmol/L (ref 0.5–2.0)
LACTIC ACID, VENOUS: 0.91 mmol/L (ref 0.5–2.0)

## 2016-05-01 LAB — CBG MONITORING, ED: GLUCOSE-CAPILLARY: 127 mg/dL — AB (ref 65–99)

## 2016-05-01 LAB — PROCALCITONIN

## 2016-05-01 LAB — DIGOXIN LEVEL: DIGOXIN LVL: 1.3 ng/mL (ref 0.8–2.0)

## 2016-05-01 SURGERY — INCISION AND DRAINAGE
Anesthesia: General | Laterality: Left

## 2016-05-01 MED ORDER — NOREPINEPHRINE BITARTRATE 1 MG/ML IV SOLN
INTRAVENOUS | Status: AC
  Filled 2016-05-01: qty 4

## 2016-05-01 MED ORDER — NOREPINEPHRINE BITARTRATE 1 MG/ML IV SOLN
2.0000 ug | Freq: Once | INTRAVENOUS | Status: DC
  Administered 2016-05-01: 2 ug via INTRAVENOUS

## 2016-05-01 MED ORDER — SODIUM CHLORIDE 0.9 % IV SOLN
INTRAVENOUS | Status: DC
  Administered 2016-05-01: 12:00:00 via INTRAVENOUS

## 2016-05-01 MED ORDER — ONDANSETRON HCL 4 MG PO TABS: 4.0000 mg | ORAL_TABLET | Freq: Four times a day (QID) | ORAL | Status: DC | PRN

## 2016-05-01 MED ORDER — ATROPINE SULFATE 1 MG/10ML IJ SOSY
1.0000 mg | PREFILLED_SYRINGE | Freq: Once | INTRAMUSCULAR | Status: AC
  Administered 2016-05-01: 1 mg via INTRAVENOUS

## 2016-05-01 MED ORDER — SODIUM CHLORIDE 0.9 % IV BOLUS (SEPSIS): 250.0000 mL | Freq: Once | INTRAVENOUS | Status: DC

## 2016-05-01 MED ORDER — SODIUM CHLORIDE 0.9% FLUSH
3.0000 mL | Freq: Two times a day (BID) | INTRAVENOUS | Status: DC
  Administered 2016-05-01 – 2016-05-06 (×11): 3 mL via INTRAVENOUS

## 2016-05-01 MED ORDER — RANOLAZINE ER 500 MG PO TB12
1000.0000 mg | ORAL_TABLET | Freq: Two times a day (BID) | ORAL | Status: DC
  Administered 2016-05-01 – 2016-05-06 (×11): 1000 mg via ORAL
  Filled 2016-05-01 (×15): qty 2

## 2016-05-01 MED ORDER — DEXTROSE 50 % IV SOLN
1.0000 | Freq: Once | INTRAVENOUS | Status: AC
  Administered 2016-05-01: 50 mL via INTRAVENOUS

## 2016-05-01 MED ORDER — DABIGATRAN ETEXILATE MESYLATE 150 MG PO CAPS
150.0000 mg | ORAL_CAPSULE | Freq: Two times a day (BID) | ORAL | Status: DC
  Administered 2016-05-01 – 2016-05-05 (×9): 150 mg via ORAL
  Filled 2016-05-01 (×13): qty 1

## 2016-05-01 MED ORDER — POLYETHYLENE GLYCOL 3350 17 G PO PACK: 17.0000 g | PACK | Freq: Every day | ORAL | Status: DC | PRN

## 2016-05-01 MED ORDER — EPINEPHRINE HCL 0.1 MG/ML IJ SOSY
1.0000 | PREFILLED_SYRINGE | Freq: Once | INTRAMUSCULAR | Status: AC
  Administered 2016-05-01: 1 mg via INTRAVENOUS

## 2016-05-01 MED ORDER — SODIUM CHLORIDE 0.9 % IV BOLUS (SEPSIS)
1000.0000 mL | Freq: Once | INTRAVENOUS | Status: DC
  Administered 2016-05-01: 1000 mL via INTRAVENOUS

## 2016-05-01 MED ORDER — ACETAMINOPHEN 650 MG RE SUPP: 650.0000 mg | Freq: Four times a day (QID) | RECTAL | Status: DC | PRN

## 2016-05-01 MED ORDER — ACETAMINOPHEN 325 MG PO TABS: 650.0000 mg | ORAL_TABLET | Freq: Four times a day (QID) | ORAL | Status: DC | PRN

## 2016-05-01 MED ORDER — SODIUM CHLORIDE 0.9 % IV BOLUS (SEPSIS)
250.0000 mL | Freq: Once | INTRAVENOUS | Status: AC
  Administered 2016-05-01: 250 mL via INTRAVENOUS

## 2016-05-01 MED ORDER — ASPIRIN 81 MG PO CHEW
324.0000 mg | CHEWABLE_TABLET | Freq: Once | ORAL | Status: AC
  Administered 2016-05-01: 324 mg via ORAL
  Filled 2016-05-01: qty 4

## 2016-05-01 MED ORDER — VANCOMYCIN HCL IN DEXTROSE 1-5 GM/200ML-% IV SOLN
1000.0000 mg | Freq: Once | INTRAVENOUS | Status: DC
  Administered 2016-05-01: 1000 mg via INTRAVENOUS
  Filled 2016-05-01: qty 200

## 2016-05-01 MED ORDER — PIPERACILLIN-TAZOBACTAM 3.375 G IVPB
3.3750 g | Freq: Three times a day (TID) | INTRAVENOUS | Status: DC
  Administered 2016-05-01 – 2016-05-06 (×15): 3.375 g via INTRAVENOUS
  Filled 2016-05-01 (×13): qty 50

## 2016-05-01 MED ORDER — VANCOMYCIN HCL IN DEXTROSE 1-5 GM/200ML-% IV SOLN: 1000.0000 mg | Freq: Once | INTRAVENOUS | Status: DC

## 2016-05-01 MED ORDER — CHLORHEXIDINE GLUCONATE 4 % EX LIQD: 60.0000 mL | Freq: Once | CUTANEOUS | Status: DC

## 2016-05-01 MED ORDER — DEXTROSE 5 % IV SOLN
0.0000 ug/min | INTRAVENOUS | Status: DC
  Administered 2016-05-01: 15 ug/min via INTRAVENOUS
  Administered 2016-05-02: 3 ug/min via INTRAVENOUS
  Filled 2016-05-01 (×4): qty 4

## 2016-05-01 MED ORDER — DEXTROSE 50 % IV SOLN
INTRAVENOUS | Status: AC
  Filled 2016-05-01: qty 50

## 2016-05-01 MED ORDER — VANCOMYCIN HCL IN DEXTROSE 1-5 GM/200ML-% IV SOLN
1000.0000 mg | INTRAVENOUS | Status: DC
  Administered 2016-05-02 – 2016-05-06 (×5): 1000 mg via INTRAVENOUS
  Filled 2016-05-01 (×3): qty 200

## 2016-05-01 MED ORDER — SODIUM CHLORIDE 0.9 % IV BOLUS (SEPSIS): 1000.0000 mL | Freq: Once | INTRAVENOUS | Status: DC

## 2016-05-01 MED ORDER — ONDANSETRON HCL 4 MG/2ML IJ SOLN: 4.0000 mg | Freq: Four times a day (QID) | INTRAMUSCULAR | Status: DC | PRN

## 2016-05-01 MED ORDER — SODIUM CHLORIDE 0.9 % IV SOLN
INTRAVENOUS | Status: DC
  Administered 2016-05-01 – 2016-05-03 (×5): via INTRAVENOUS
  Administered 2016-05-04: 125 mL/h via INTRAVENOUS
  Administered 2016-05-04 – 2016-05-06 (×2): via INTRAVENOUS

## 2016-05-01 MED ORDER — OXYCODONE HCL 5 MG PO TABS
10.0000 mg | ORAL_TABLET | Freq: Four times a day (QID) | ORAL | Status: DC | PRN
Start: 2016-05-01 — End: 2016-05-06
  Administered 2016-05-01 – 2016-05-04 (×3): 10 mg via ORAL
  Filled 2016-05-01 (×7): qty 2

## 2016-05-01 MED ORDER — CALCIUM CHLORIDE 10 % IV SOLN
1.0000 g | Freq: Once | INTRAVENOUS | Status: AC
  Administered 2016-05-01: 1 g via INTRAVENOUS

## 2016-05-01 MED ORDER — PIPERACILLIN-TAZOBACTAM 3.375 G IVPB 30 MIN
3.3750 g | Freq: Once | INTRAVENOUS | Status: DC
  Administered 2016-05-01: 3.375 g via INTRAVENOUS
  Filled 2016-05-01: qty 50

## 2016-05-01 MED ORDER — SODIUM BICARBONATE 8.4 % IV SOLN
50.0000 meq | Freq: Once | INTRAVENOUS | Status: AC
  Administered 2016-05-01: 50 meq via INTRAVENOUS

## 2016-05-01 MED ORDER — PIPERACILLIN-TAZOBACTAM 3.375 G IVPB 30 MIN: 3.3750 g | Freq: Once | INTRAVENOUS | Status: DC

## 2016-05-01 NOTE — ED Notes (Signed)
Pt alert with family at bedside.  No distress.  Pt color improved and extremities warmer to touch.

## 2016-05-01 NOTE — ED Notes (Signed)
CRITICAL VALUE ALERT  Critical value received:  K+-6.5 no hemolysis   Date of notification:  05/01/16  Time of notification:  0750  Critical value read back:Yes.    Nurse who received alert:  Berdine DanceMandi Deaunna Olarte RN  MD notified (1st page):  Ruthy DickZachowski  Time of first page:  0750  MD notified (2nd page):  Time of second page:  Responding MD:  Ruthy DickZachowski  Time MD responded:  [phone]

## 2016-05-01 NOTE — Progress Notes (Addendum)
Inpatient Diabetes Program Recommendations  AACE/ADA: New Consensus Statement on Inpatient Glycemic Control (2015)  Target Ranges:  Prepandial:   less than 140 mg/dL      Peak postprandial:   less than 180 mg/dL (1-2 hours)      Critically ill patients:  140 - 180 mg/dL   Review of Glycemic Control  Diabetes history: DM 2 Outpatient Diabetes medications: Novolog 20 units TID, Levemir 40-50 units Daily Current orders for Inpatient glycemic control: None  Inpatient Diabetes Program Recommendations:  Insulin - Basal: Note patient admitted with hypoglycemia in addition to other problems. Hypoglycemia resolved since initial treatment. Last admission patient was on lower dose of basal insulin Levemir 20 units. Please consider ordering less than half of patient's home basal insulin dose, Levemir 15 units to start tonight 5/11.  Correction (SSI): Please consider starting Novolog Sensitive Correction + HS scale while inpatient.  Thanks,  Christena DeemShannon Marleni Gallardo RN, MSN, [facility] Inpatient Diabetes Coordinator Team Pager [phone] (8a-5p)

## 2016-05-01 NOTE — Progress Notes (Addendum)
Pharmacy Antibiotic Note  [name] is a 70 y.o. male admitted on 05/01/2016 with sepsis.  [facility]  SCr is elevated above baseline.  Pt was on Vancomycin prior to this scheduled procedure.    BRIEF NARRATIVE: Patient brought over in acute distress from same day surgery unit. 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 Romeo AppleHarrison. Patient has been at [facility]. Patient was admitted April 28 for cellulitis of the left ankle. Is on IV Vanco and has a PICC line in his right arm. Patient was discovered by the same day surgery unit people to be unresponsive.  Currently it is not clear when pt had last dose of Vanc prior to this procedure  Plan: Check Vanc level in am to evaluate clearance and additional dosing requirements Vancomycin 1000 IV every 24 hours.  Goal trough 15-20 mcg/mL.  If level on target.  Zosyn 3.375gm IV q8h, EID Monitor labs, renal fxn, progress and c/s  Height: 5\' 9"  (175.3 cm) Weight: 152 lb (68.947 kg) IBW/kg (Calculated) : 70.7  Temp (24hrs), Avg:98 F (36.7 C), Min:97.5 F (36.4 C), Max:98.8 F (37.1 C)   Recent Labs Lab 05/01/16 0710 05/01/16 0857 05/01/16 0906 05/01/16 1027  WBC 22.3*  --   --   --   CREATININE 2.43*  --  2.20*  --   LATICACIDVEN  --  1.31  --  0.91    Estimated Creatinine Clearance: 30.9 mL/min (by C-G formula based on Cr of 2.2).    No Known Allergies  Antimicrobials this admission: 5/11 Vanc >>  5/11 Zosyn >>   Dose adjustments this admission: n/a  Microbiology results: 5/11 BCx: pending 5/11 UCx: pending  5/11 MRSA PCR: pending  Thank you for allowing pharmacy to be a part of this patient's care.  Valrie HartHall, Oreta Soloway A 05/01/2016 11:57 AM

## 2016-05-01 NOTE — ED Notes (Signed)
Levophed increased to 4mcg/min.

## 2016-05-01 NOTE — ED Provider Notes (Signed)
CSN: [id number]     Arrival date & time 05/01/16  0706 History   First MD Initiated Contact with Patient 05/01/16 0820     Chief Complaint  Patient presents with  . Bradycardia     (Consider location/radiation/quality/duration/timing/severity/associated sxs/prior Treatment) The history is provided by the nursing home, the spouse and a relative.  Patient brought over in acute distress from same day surgery unit. 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 Romeo Apple. Patient has been at [facility] rehabilitation. Patient was admitted April 28 for cellulitis of the left ankle. Is on IV Vanco and has a PICC line in his right arm. Patient was discovered by the same day surgery unit people to be unresponsive. No one was with him. Patient's blood sugar was 55. Heart rate was around 30. Patient a properly brought to the emergency department. Upon arrival here patient was arousable but very somnolent. Patient was pale looking. Patient's oxygen saturations on 2 L was in the 90% range. Heart rate was around the 30 blood pressure was 55 systolic. Patient denied any pain.  Past Medical History  Diagnosis Date  . Coronary atherosclerosis of native coronary artery     Ant. MI in 4/95; PTCA of 90% prox & distal LAD unsuccessful-->  Urgent CABG-4/95 TO Cx; 50% RCA; ant. HK; EF of 50-55%; 08/2008: 3-V disease plus a  95% LIMA stenosis, treated with DES; occlusion of SVG to CX; RCA SVG stenosis--> PCI with DES  2010: in-stent restenosis in the LIMA-->cutting balloon; EF of 35-40%;  07/2010: TO of SVG to RCA-medical therapy advised  . Atrial fibrillation (HCC)     1995, 2009; bradycardia with beta blocker therapy  . Peripheral vascular disease (HCC) 1995    Abdominal aortic obstruction 1995-->vascular surgery  . Hyperlipidemia   . Type 2 diabetes mellitus (HCC)   . DDD (degenerative disc disease)     Discectomy and fusion at L4 and L5  . History of alcohol abuse    Quit in 1999  . Chronic systolic heart failure (HCC)   . Non-compliance   . Ischemic cardiomyopathy     LVEF 40-45% August 2016   Past Surgical History  Procedure Laterality Date  . Cholecystectomy  1995  . Lumbar fusion      +Discectomy;x2; L4 and L5  . Aorto-femoral bypass graft  1995  . Left heart catheterization with coronary angiogram N/A 12/14/2013    Procedure: LEFT HEART CATHETERIZATION WITH CORONARY ANGIOGRAM;  Surgeon: Micheline Chapman, MD;  Location: [facility] CATH LAB;  Service: Cardiovascular;  Laterality: N/A;  . Back surgery     Family History  Problem Relation Age of Onset  . CAD Father    Social History  Substance Use Topics  . Smoking status: Current Every Day Smoker -- 0.50 packs/day for 40 years    Types: Cigarettes  . Smokeless tobacco: Current User     Comment: refused  . Alcohol Use: No     Comment: former    Review of Systems  Unable to perform ROS: Acuity of condition      Allergies  Review of patient's allergies indicates no known allergies.  Home Medications   Prior to Admission medications   Medication Sig Start Date End Date Taking? Authorizing Provider  albuterol (PROVENTIL HFA;VENTOLIN HFA) 108 (90 BASE) MCG/ACT inhaler Inhale 2 puffs into the lungs every 6 (six) hours as needed for wheezing or shortness of breath.    Historical Provider, MD  carvedilol (  COREG) 3.125 MG tablet Take 1 tablet (3.125 mg total) by mouth 2 (two) times daily. 11/06/15   Antoine Poche, MD  dabigatran (PRADAXA) 150 MG CAPS capsule Take 1 capsule (150 mg total) by mouth 2 (two) times daily. Take 1 capsule poo bid until 5/7 then discontinue iin anticipation of orthopaedic surgery on 5/11 04/23/16 04/27/16  Oval Linsey, MD  digoxin (LANOXIN) 0.125 MG tablet Take 125 mcg by mouth daily.      Historical Provider, MD  furosemide (LASIX) 40 MG tablet Take 1 tablet (40 mg total) by mouth every other day. 11/21/14   Antoine Poche, MD  insulin aspart (NOVOLOG FLEXPEN) 100  UNIT/ML FlexPen Inject 6 Units into the skin 2 (two) times daily with a meal. 8am, 12pm, and 4pm Patient taking differently: Inject 20 Units into the skin 3 (three) times daily. 8am, 12pm, and 4pm 12/15/15   Oval Linsey, MD  insulin detemir (LEVEMIR) 100 UNIT/ML injection Inject 0.2 mLs (20 Units total) into the skin daily. Patient taking differently: Inject 40-50 Units into the skin daily.  12/15/15   Oval Linsey, MD  isosorbide mononitrate (IMDUR) 30 MG 24 hr tablet Take 30 mg by mouth daily.    Historical Provider, MD  isosorbide mononitrate (IMDUR) 30 MG 24 hr tablet Take 1 tablet (30 mg total) by mouth daily. 04/23/16   Oval Linsey, MD  lisinopril (PRINIVIL,ZESTRIL) 5 MG tablet Take 1 tablet (5 mg total) by mouth daily. 02/28/14   Antoine Poche, MD  nitroGLYCERIN (NITROSTAT) 0.4 MG SL tablet Place 1 tablet (0.4 mg total) under the tongue every 5 (five) minutes as needed. Patient taking differently: Place 0.4 mg under the tongue every 5 (five) minutes as needed for chest pain.  08/10/15   Brittainy Sherlynn Carbon, PA-C  Oxycodone HCl 10 MG TABS Take 10 mg by mouth 4 (four) times daily as needed (for pain).    Historical Provider, MD  potassium chloride SA (K-DUR,KLOR-CON) 20 MEQ tablet TAKE ONE TABLET BY MOUTH TWICE A DAY 11/21/15   Antoine Poche, MD  ranolazine (RANEXA) 1000 MG SR tablet Take 1 tablet (1,000 mg total) by mouth 2 (two) times daily. 04/23/16   Oval Linsey, MD  rosuvastatin (CRESTOR) 40 MG tablet Take 40 mg by mouth daily.    Historical Provider, MD  Vancomycin (VANCOCIN) 750-5 MG/150ML-% SOLN Inject 150 mLs (750 mg total) into the vein every 12 (twelve) hours. 04/23/16   Oval Linsey, MD   BP 115/71 mmHg  Pulse 48  Temp(Src) 97.9 F (36.6 C)  Resp 15  Ht 5\' 7"  (1.702 m)  Wt 68.947 kg  BMI 23.80 kg/m2  SpO2 96% Physical Exam  Constitutional: He appears distressed.  HENT:  Head: Normocephalic and atraumatic.  Mucous membranes dry  Eyes: Conjunctivae  are normal. Pupils are equal, round, and reactive to light.  Neck: Neck supple.  Cardiovascular:  Severe bradycardia  Pulmonary/Chest: Effort normal and breath sounds normal. No respiratory distress.  Abdominal: Soft. He exhibits no distension. There is no tenderness.  Musculoskeletal: He exhibits no edema.  Dressing on the lateral aspect of the left ankle.  Neurological:  Somnolent but arousable  Skin:  Cool  Nursing note and vitals reviewed.   ED Course  Procedures (including critical care time) Labs Review Labs Reviewed  COMPREHENSIVE METABOLIC PANEL - Abnormal; Notable for the following:    Potassium 6.5 (*)    BUN 46 (*)    Creatinine, Ser 2.43 (*)    Albumin 2.8 (*)  GFR calc non Af Amer 26 (*)    GFR calc Af Amer 30 (*)    All other components within normal limits  CBC WITH DIFFERENTIAL/PLATELET - Abnormal; Notable for the following:    WBC 22.3 (*)    RBC 4.14 (*)    Hemoglobin 12.6 (*)    Neutro Abs 19.0 (*)    Monocytes Absolute 1.1 (*)    All other components within normal limits  TROPONIN I - Abnormal; Notable for the following:    Troponin I 0.04 (*)    All other components within normal limits  CULTURE, BLOOD (ROUTINE X 2)  CULTURE, BLOOD (ROUTINE X 2)  URINE CULTURE  URINALYSIS, ROUTINE W REFLEX MICROSCOPIC (NOT AT [facility])  URINALYSIS, ROUTINE W REFLEX MICROSCOPIC (NOT AT [facility])  I-STAT CG4 LACTIC ACID, ED  I-STAT CG4 LACTIC ACID, ED  I-STAT TROPOININ, ED  I-STAT CHEM 8, ED   Results for orders placed or performed during the hospital encounter of 05/01/16  Comprehensive metabolic panel  Result Value Ref Range   Sodium 139 135 - 145 mmol/L   Potassium 6.5 (HH) 3.5 - 5.1 mmol/L   Chloride 103 101 - 111 mmol/L   CO2 26 22 - 32 mmol/L   Glucose, Bld 95 65 - 99 mg/dL   BUN 46 (H) 6 - 20 mg/dL   Creatinine, Ser 1.54 (H) 0.61 - 1.24 mg/dL   Calcium 8.9 8.9 - 00.8 mg/dL   Total Protein 7.5 6.5 - 8.1 g/dL   Albumin 2.8 (L) 3.5 - 5.0 g/dL   AST 17 15 -  41 U/L   ALT 18 17 - 63 U/L   Alkaline Phosphatase 71 38 - 126 U/L   Total Bilirubin 0.7 0.3 - 1.2 mg/dL   GFR calc non Af Amer 26 (L) >60 mL/min   GFR calc Af Amer 30 (L) >60 mL/min   Anion gap 10 5 - 15  CBC with Differential  Result Value Ref Range   WBC 22.3 (H) 4.0 - 10.5 K/uL   RBC 4.14 (L) 4.22 - 5.81 MIL/uL   Hemoglobin 12.6 (L) 13.0 - 17.0 g/dL   HCT 67.6 19.5 - 09.3 %   MCV 95.9 78.0 - 100.0 fL   MCH 30.4 26.0 - 34.0 pg   MCHC 31.7 30.0 - 36.0 g/dL   RDW 26.7 12.4 - 58.0 %   Platelets 274 150 - 400 K/uL   Neutrophils Relative % 86 %   Neutro Abs 19.0 (H) 1.7 - 7.7 K/uL   Lymphocytes Relative 9 %   Lymphs Abs 2.1 0.7 - 4.0 K/uL   Monocytes Relative 5 %   Monocytes Absolute 1.1 (H) 0.1 - 1.0 K/uL   Eosinophils Relative 0 %   Eosinophils Absolute 0.1 0.0 - 0.7 K/uL   Basophils Relative 0 %   Basophils Absolute 0.1 0.0 - 0.1 K/uL  Troponin I  Result Value Ref Range   Troponin I 0.04 (H) <0.031 ng/mL   Having difficulty with the i-STAT labs crossing over into the chart.  Labs just done at this time at about 0 9:30: Troponin 0.09 Potassium 5.5 BUN 37 Creatinine 2.2 Lactic acid 1.31  These labs were done after complete fluid resuscitation and patient had already been on norepinephrine.    Imaging Review Dg Ankle 2 Views Left  05/01/2016  CLINICAL DATA:  Clinical history of osteomyelitis EXAM: LEFT ANKLE - 2 VIEW COMPARISON:  04/18/2016 FINDINGS: There again changes consistent with mild collapse of the navicular bone stable  from the prior exam. Soft tissue irregularity is noted laterally over the lateral malleolus. There is now noted some increased erosion in this area consistent with the given clinical history of osteomyelitis. IMPRESSION: No acute fracture noted. Chronic changes in the navicular. Increased erosion in the distal aspect of the fibula consistent with progressive osteomyelitis. Electronically Signed   By: Alcide CleverMark  Lukens M.D.   On: 05/01/2016 09:08   Dg  Chest Portable 1 View  05/01/2016  CLINICAL DATA:  Recently found slumped over awaiting surgical procedure, initial encounter EXAM: PORTABLE CHEST 1 VIEW COMPARISON:  04/23/2016 FINDINGS: Right-sided PICC line is again noted. Cardiac shadow is stable. Postsurgical changes are again seen. The lungs are clear bilaterally. No bony abnormality is noted. IMPRESSION: No acute abnormality noted. Electronically Signed   By: Alcide CleverMark  Lukens M.D.   On: 05/01/2016 07:52   I have personally reviewed and evaluated these images and lab results as part of my medical decision-making.   EKG Interpretation   Date/Time:  Thursday May 01 2016 08:27:10 EDT Ventricular Rate:  81 PR Interval:  65 QRS Duration: 97 QT Interval:  401 QTC Calculation: 465 R Axis:   110 Text Interpretation:  Uncertain rhythm: review Abnormal lateral Q waves  Anterior infarct, old Minimal ST depression, anterolateral leads Confirmed  by Alveta Quintela  MD, Yasiel Goyne [phone]([id number]) on 05/01/2016 8:30:34 AM      CRITICAL CARE Performed by: Vanetta MuldersZACKOWSKI,Shandee Jergens Total critical care time: 90 minutes Critical care time was exclusive of separately billable procedures and treating other patients. Critical care was necessary to treat or prevent imminent or life-threatening deterioration. Critical care was time spent personally by me on the following activities: development of treatment plan with patient and/or surrogate as well as nursing, discussions with consultants, evaluation of patient's response to treatment, examination of patient, obtaining history from patient or surrogate, ordering and performing treatments and interventions, ordering and review of laboratory studies, ordering and review of radiographic studies, pulse oximetry and re-evaluation of patient's condition.     MDM   Final diagnoses:  Osteomyelitis (HCC)  Sepsis, due to unspecified organism (HCC)  Hypotension, unspecified hypotension type  Bradycardia  Hyperkalemia    The patient  brought over from same day surgery unit he was scheduled to have surgery on his left ankle which is known to have osteomyelitis based on admission on April 28. [facility] in [facility]. He's been receiving IV vancomycin. Patient was found unresponsive over at the surgical unit. Patient's blood sugar there was 55. Patient was brought here. Heart rate was around 30 blood pressure was around 55. Patient did not respond to atropine. Patient also given sugar. No significant improvement. Patient when he came over was drowsy but the arousable and would talk some. Patient then was given epinephrine which resulted in significant improvement in his heart rate and blood pressure he became more alert. Clinical impression was probably sepsis. Patient's temperature was also low.   Patient started on norepinephrine drip patient started on sepsis protocol which would give him 2250 milliliters of fluid. Patient started on norepinephrine drip and it was titrated. Highest level he got to was 7. Part his blood pressures up brought his heart rate up. Patient also started on broad-spectrum antibiotics Zosyn, vancomycin was held because he's been on Vancomyosin.  Patient's initial EKG showed no evidence of MI when he got the epinephrine there was some question of an inferior MI. Patient always denied any chest pain. Family arrived and gave additional information the patient was out  of it yesterday at the nursing facility.  Workup here shows a negative chest x-ray for pneumonia patient's oxygen saturations on 2 L are 100%. Patient's urinalysis still pending. As stated above his lactic acid is normal. Patient probably stable for admission here. We've had to slightly elevated troponins but doubt that there is been an MI. Repeat EKG after the fast heart rate with the epinephrine shows no evidence of any acute changes. We'll discuss with hospitalist here.  In addition patient's potassium was high at 6.5 on initial labs.  As stated repeat the potassium was 5.5. Patient received the calcium chloride and bicarbonate for this initially as acute intervention for the hyperkalemia. Renal function shows evidence of BUN and creatinine elevation consistent maybe with a prerenal type picture.  Also x-ray of his left ankle where he is known to have osteomyelitis show some progression of that disease that would explain why was supposed to get surgery.  Patient is currently alert and able to answer questions clinically seems to have improved significantly. Currently weaning the norepinephrine.   Vanetta Mulders, MD 05/01/16 1031

## 2016-05-01 NOTE — ED Notes (Signed)
Critical value 0.09 i-stat troponin reported to Dr. Deretha EmoryZackowski

## 2016-05-01 NOTE — ED Notes (Signed)
Titrated levophed to 3 mcg/min

## 2016-05-01 NOTE — ED Notes (Signed)
Pt here from Short Stay. Pt was to have a I & D  Of ankle. Pt was found to be slumped over in their waiting room. Pt was found to have a BG of 55 and a heart rate in the 30 's

## 2016-05-01 NOTE — ED Notes (Signed)
Increased Levophed to 486mcg/min

## 2016-05-01 NOTE — H&P (Addendum)
History and Physical  [name] ZOX:[id number] DOB: [birth year] DOA: 05/01/2016  PCP: [name], MD   Chief Complaint: Bradycardia  HPI:  70 yom recently admitted for osteomyelitis of the left ankle and subsequently discharged to skilled nursing facility on IV antibiotics with plans for surgery on the left ankle the future, presented from [facility] to surgical unit for planned surgery in the left ankle with osteomyelitis. He was found to be lethargic, bradycardic and hypoglycemic and was therefore sent to the emergency department. There he was found to have multiple abnormalities including acute encephalopathy, hyperkalemia, acute kidney injury, hypotension, severe bradycardia. He was treated aggressively with antibiotics, fluids and norepinephrine with some improvement.  The patient is confused and can offer no history. Per family: Had been doing well at skilled nursing facility, eating well, per family was doing well 5/9. Per daughter when she visited 5/10 he was noted to have decreased responsiveness and somnolence.   In the emergency department hypoglycemia, hypotensive, hypothermic, bradycardic, hyperkalemic. Treated with D50, IVF, atropine and epinephrine.   Pertinent labs: potassium 6.5, BUN/creatinine up 46/2.43; troponin .04/0.03. WBC 22.3 EKG: Independently reviewed. First junctional bradycardia rate 29. Second EKG SR with RBBB. Third EKG narrow complex rhythm probably afib with normal rate. No acute changes. Poor quality, low amplitude. Similar to 4/28 EKG with low amplitute Imaging: independently reviewed: CXR independently reviewed, no acute disease. Foot xray per radiology progressive osteomyelitis  Review of Systems:  Negative for fever, visual changes, sore throat, rash, new muscle aches, chest pain, SOB, dysuria, bleeding, n/v/abdominal pain.  Past Medical History  Diagnosis Date  . Coronary atherosclerosis of native coronary artery     Ant. MI in 4/95; PTCA of  90% prox & distal LAD unsuccessful-->  Urgent CABG-4/95 TO Cx; 50% RCA; ant. HK; EF of 50-55%; 08/2008: 3-V disease plus a  95% LIMA stenosis, treated with DES; occlusion of SVG to CX; RCA SVG stenosis--> PCI with DES  2010: in-stent restenosis in the LIMA-->cutting balloon; EF of 35-40%;  07/2010: TO of SVG to RCA-medical therapy advised  . Atrial fibrillation (HCC)     1995, 2009; bradycardia with beta blocker therapy  . Peripheral vascular disease (HCC) 1995    Abdominal aortic obstruction 1995-->vascular surgery  . Hyperlipidemia   . Type 2 diabetes mellitus (HCC)   . DDD (degenerative disc disease)     Discectomy and fusion at L4 and L5  . History of alcohol abuse     Quit in 1999  . Chronic systolic heart failure (HCC)   . Non-compliance   . Ischemic cardiomyopathy     LVEF 40-45% August 2016    Past Surgical History  Procedure Laterality Date  . Cholecystectomy  1995  . Lumbar fusion      +Discectomy;x2; L4 and L5  . Aorto-femoral bypass graft  1995  . Left heart catheterization with coronary angiogram N/A 12/14/2013    Procedure: LEFT HEART CATHETERIZATION WITH CORONARY ANGIOGRAM;  Surgeon: Micheline Chapman, MD;  Location: [facility] CATH LAB;  Service: Cardiovascular;  Laterality: N/A;  . Back surgery      Social History:  reports that he has quit smoking. His smoking use included Cigarettes. He has a 20 pack-year smoking history. He uses smokeless tobacco. He reports that he does not drink alcohol or use illicit drugs. lives with their spouse Partial assistance  No Known Allergies  Family History  Problem Relation Age of Onset  . CAD Father      Prior to Admission medications  Medication Sig Start Date End Date Taking? Authorizing Provider  albuterol (PROVENTIL HFA;VENTOLIN HFA) 108 (90 BASE) MCG/ACT inhaler Inhale 2 puffs into the lungs every 6 (six) hours as needed for wheezing or shortness of breath.    Historical Provider, MD  carvedilol (COREG) 3.125 MG tablet Take 1  tablet (3.125 mg total) by mouth 2 (two) times daily. 11/06/15   Antoine Poche, MD  dabigatran (PRADAXA) 150 MG CAPS capsule Take 1 capsule (150 mg total) by mouth 2 (two) times daily. Take 1 capsule poo bid until 5/7 then discontinue iin anticipation of orthopaedic surgery on 5/11 04/23/16 04/27/16  Oval Linsey, MD  digoxin (LANOXIN) 0.125 MG tablet Take 125 mcg by mouth daily.      Historical Provider, MD  furosemide (LASIX) 40 MG tablet Take 1 tablet (40 mg total) by mouth every other day. 11/21/14   Antoine Poche, MD  insulin aspart (NOVOLOG FLEXPEN) 100 UNIT/ML FlexPen Inject 6 Units into the skin 2 (two) times daily with a meal. 8am, 12pm, and 4pm Patient taking differently: Inject 20 Units into the skin 3 (three) times daily. 8am, 12pm, and 4pm 12/15/15   Oval Linsey, MD  insulin detemir (LEVEMIR) 100 UNIT/ML injection Inject 0.2 mLs (20 Units total) into the skin daily. Patient taking differently: Inject 40-50 Units into the skin daily.  12/15/15   Oval Linsey, MD  isosorbide mononitrate (IMDUR) 30 MG 24 hr tablet Take 30 mg by mouth daily.    Historical Provider, MD  isosorbide mononitrate (IMDUR) 30 MG 24 hr tablet Take 1 tablet (30 mg total) by mouth daily. 04/23/16   Oval Linsey, MD  lisinopril (PRINIVIL,ZESTRIL) 5 MG tablet Take 1 tablet (5 mg total) by mouth daily. 02/28/14   Antoine Poche, MD  nitroGLYCERIN (NITROSTAT) 0.4 MG SL tablet Place 1 tablet (0.4 mg total) under the tongue every 5 (five) minutes as needed. Patient taking differently: Place 0.4 mg under the tongue every 5 (five) minutes as needed for chest pain.  08/10/15   Brittainy Sherlynn Carbon, PA-C  Oxycodone HCl 10 MG TABS Take 10 mg by mouth 4 (four) times daily as needed (for pain).    Historical Provider, MD  potassium chloride SA (K-DUR,KLOR-CON) 20 MEQ tablet TAKE ONE TABLET BY MOUTH TWICE A DAY 11/21/15   Antoine Poche, MD  ranolazine (RANEXA) 1000 MG SR tablet Take 1 tablet (1,000 mg total) by  mouth 2 (two) times daily. 04/23/16   Oval Linsey, MD  rosuvastatin (CRESTOR) 40 MG tablet Take 40 mg by mouth daily.    Historical Provider, MD  Vancomycin (VANCOCIN) 750-5 MG/150ML-% SOLN Inject 150 mLs (750 mg total) into the vein every 12 (twelve) hours. 04/23/16   Oval Linsey, MD   Physical Exam: Filed Vitals:   05/01/16 1035 05/01/16 1045 05/01/16 1125 05/01/16 1126  BP: 96/57 93/64 104/58   Pulse:  82 83   Temp: 98.1 F (36.7 C) 97.7 F (36.5 C) 98.6 F (37 C)   Resp: 16 18 17    Height:    5\' 9"  (1.753 m)  Weight:      SpO2:  100% 100%    Constitutional:  . Appears Ill, confused, delirious, intermittent tremor, appears to be responding to internal stimuli, not toxic Eyes:  Marland Kitchen Mydriasis noted. Irises appear normal. . Normal conjunctivae and lids ENMT:  . external ears, nose appear normal . Heart of hearing . Lips appear normal; teeth, gums  . Oropharynx: mucosa, tongue,posterior pharynx appear normal Neck:  .  neck appears normal, no masses, normal ROM, supple . no thyromegaly Respiratory:  . CTA bilaterally, no w/r/r.  . Respiratory effort normal. No retractions or accessory muscle use Cardiovascular:  . Irregular rhythm normal rate. No m/r/g . No LE extremity edema   Abdomen:  . Abdomen appears normal; no tenderness or masses Musculoskeletal:  . RUE, LUE, RLE, LLE   o strength and tone normal, no atrophy o Tremors pronounced upper extremities with some jerking type movements. Skin:  . Abrasion on right ankle  . Also less malleolus without exudate or erythema    Neurologic:  . Cranial nerves appear intact. Family reports face appears to be normal. Psychiatric:  . judgement and insight cannot be assessed. Patient clearly confused. . Mental status o Orientation to self only. o Does not recognize family.   Wt Readings from Last 3 Encounters:  05/01/16 68.947 kg (152 lb)  04/18/16 68.992 kg (152 lb 1.6 oz)  02/20/16 68.04 kg (150 lb)    Labs on  Admission:  Basic Metabolic Panel:  Recent Labs Lab 05/01/16 0710 05/01/16 0906  NA 139 140  K 6.5* 5.5*  CL 103 104  CO2 26  --   GLUCOSE 95 221*  BUN 46* 37*  CREATININE 2.43* 2.20*  CALCIUM 8.9  --     Liver Function Tests:  Recent Labs Lab 05/01/16 0710  AST 17  ALT 18  ALKPHOS 71  BILITOT 0.7  PROT 7.5  ALBUMIN 2.8*    CBC:  Recent Labs Lab 05/01/16 0710 05/01/16 0906  WBC 22.3*  --   NEUTROABS 19.0*  --   HGB 12.6* 11.6*  HCT 39.7 34.0*  MCV 95.9  --   PLT 274  --     Cardiac Enzymes:  Recent Labs Lab 05/01/16 0710  TROPONINI 0.04*    Troponin (Point of Care Test)  Recent Labs  05/01/16 0857  TROPIPOC 0.09*    CBG:  Recent Labs Lab 05/01/16 0700 05/01/16 0724  GLUCAP 55* 127*     Radiological Exams on Admission: Dg Ankle 2 Views Left  05/01/2016  CLINICAL DATA:  Clinical history of osteomyelitis EXAM: LEFT ANKLE - 2 VIEW COMPARISON:  04/18/2016 FINDINGS: There again changes consistent with mild collapse of the navicular bone stable from the prior exam. Soft tissue irregularity is noted laterally over the lateral malleolus. There is now noted some increased erosion in this area consistent with the given clinical history of osteomyelitis. IMPRESSION: No acute fracture noted. Chronic changes in the navicular. Increased erosion in the distal aspect of the fibula consistent with progressive osteomyelitis. Electronically Signed   By: Alcide Clever M.D.   On: 05/01/2016 09:08   Dg Chest Portable 1 View  05/01/2016  CLINICAL DATA:  Recently found slumped over awaiting surgical procedure, initial encounter EXAM: PORTABLE CHEST 1 VIEW COMPARISON:  04/23/2016 FINDINGS: Right-sided PICC line is again noted. Cardiac shadow is stable. Postsurgical changes are again seen. The lungs are clear bilaterally. No bony abnormality is noted. IMPRESSION: No acute abnormality noted. Electronically Signed   By: Alcide Clever M.D.   On: 05/01/2016 07:52       Active Problems:   Atrial fibrillation (HCC)   Chronic anticoagulation   Hyperkalemia   DKA, type 2 (HCC)   Chronic combined systolic and diastolic CHF, NYHA class 3 (HCC)   Troponin I above reference range   Hypoglycemia   Hypothermia   Hypotension   AKI (acute kidney injury) (HCC)   Osteomyelitis of ankle (HCC)  Sepsis [facility](HCC)   Assessment/Plan 70 year old man with osteomyelitis left ankle presented to the surgical unit with poor responsiveness. Initial evaluation revealed bradycardia, hypoglycemia, hypotension, hyperkalemia, hypothermia. Lactic acid was normal and sepsis is not felt to be likely at this point.  1. Possible sepsis with hypotension, reported hypothermia, bradycardia, acute encephalopathy. Borderline sirs criteria. Less likely then other diagnoses as discussed below.  2. Junctional bradycardia, resolved with epinephrine and norepinephrine. Etiology unclear, differential includes hyperkalemia, beta blocker induced, digoxin toxicity, sepsis/infection. Did not respond to atropine. On Coreg now discontinued.  3. Hypoglycemia, likely secondary to insulin administration, poor oral intake. Currently improved after treatment.  4. Hypotension resolved with IV fluid bolus and norepinephrine.Suspect related to poor oral intake and dehydration rather than sepsis. 5. Hyperkalemiasecondary to acute renal failure, complicated by potassium supplementation, lisinopril. Improved after treatment in the emergency department 6. Hypothermia, reported. Currently euthermic. 7. Acute encephalopathy. Differential wide as above. Consider digoxin toxicity. Maybe metabolic from renal failure. Nonfocal examination, no evidence to suggest stroke. 8. AKI, prerenal pattern, likely poor oral intake compounded by lisinopril and Lasix.  9. Elevated troponin, minimal with nonspecific EKG in the context of hypothermia, hyperkalemia, hypotension, suspect demand ischemia. No evidence to suggest ACS.   10. Osteomyelitis left ankle. Continue vancomycin. Management per orthopedics when stable. 11. Atrial fibrillation. appears stable at this point. Pradaxa. 12. Chronic systolic CHF, appears compensated  13. DM type 2 14. PMH CAD, ischemic cardiomyopathy   Patient encephalopathic but does follow simple commands and has a nonfocal examination. Blood pressure and heart rate improved. Plan admission to stepdown. He is critically ill, this was discussed in detail with wife and daughter at bedside.  Continue vancomycin, and Zosyn; he received appropriate volume resuscitation, may need further fluids. Continue norepinephrine  (for heart rate) but wean as tolerated.   Follow blood sugars. Follow renal function and potassium. He has received appropriate therapy in the emergency department.  Check digoxin level. Trend troponin.  Code Status:Full  DVT prophylaxis: SCDs Family Communication: Wife bedside and daughter, [name] Disposition Plan/Anticipated LOS: Inpatient, 2-3 days.   Time spent: 55 minutes  Brendia Sacksaniel Goodrich, MD  [facility] Direct contact:  --Via amion app OR  --[email or web address]; password [email or web address] and click  7PM-7AM contact night coverage as above  05/01/2016, 11:54 AM    By signing my name below, I, Zadie CleverlyJessica Augustus attest that this documentation has been prepared under the direction and in the presence of Brendia Sacksaniel Goodrich, MD Electronically signed: Zadie CleverlyJessica Augustus    I personally performed the services described in this documentation. All medical record entries made by the scribe were at my direction. I have reviewed the chart and agree that the record reflects my personal performance and is accurate and complete. Brendia Sacksaniel Goodrich, MD   ADDENDUM [phone] Much improved, awake, alert, does not remember what happened. No pain. Troponin modestly elevated. Demand ischemia favored at this point.

## 2016-05-01 NOTE — ED Notes (Addendum)
Pt from short stay, brought from [facility], for I&D of ankle. Per short stay staff, pt slumped over in wheelchair at time of calling patient back to procedural area, CBG 55. Pt lethargic. Reactive to painful stimuli. EDP at bedside. Faint radial pulse palpated. Pads placed, zoll at bedside.

## 2016-05-01 NOTE — ED Notes (Signed)
Increased Levophed to 

## 2016-05-01 NOTE — ED Notes (Signed)
Pt opens eyes when spoken to.  Color pale.  Pt cool and clammy to touch.

## 2016-05-02 DIAGNOSIS — R001 Bradycardia, unspecified: Principal | ICD-10-CM

## 2016-05-02 DIAGNOSIS — R7989 Other specified abnormal findings of blood chemistry: Secondary | ICD-10-CM

## 2016-05-02 DIAGNOSIS — I482 Chronic atrial fibrillation: Secondary | ICD-10-CM

## 2016-05-02 LAB — BASIC METABOLIC PANEL
ANION GAP: 6 (ref 5–15)
BUN: 34 mg/dL — ABNORMAL HIGH (ref 6–20)
CO2: 26 mmol/L (ref 22–32)
Calcium: 7.8 mg/dL — ABNORMAL LOW (ref 8.9–10.3)
Chloride: 100 mmol/L — ABNORMAL LOW (ref 101–111)
Creatinine, Ser: 1.6 mg/dL — ABNORMAL HIGH (ref 0.61–1.24)
GFR calc Af Amer: 49 mL/min — ABNORMAL LOW (ref >60)
GFR, EST NON AFRICAN AMERICAN: 42 mL/min — AB (ref >60)
GLUCOSE: 273 mg/dL — AB (ref 65–99)
POTASSIUM: 4.4 mmol/L (ref 3.5–5.1)
SODIUM: 132 mmol/L — AB (ref 135–145)

## 2016-05-02 LAB — CBC
HEMATOCRIT: 31.2 % — AB (ref 39.0–52.0)
HEMOGLOBIN: 10 g/dL — AB (ref 13.0–17.0)
MCH: 30.5 pg (ref 26.0–34.0)
MCHC: 32.1 g/dL (ref 30.0–36.0)
MCV: 95.1 fL (ref 78.0–100.0)
Platelets: 236 10*3/uL (ref 150–400)
RBC: 3.28 MIL/uL — ABNORMAL LOW (ref 4.22–5.81)
RDW: 13.6 % (ref 11.5–15.5)
WBC: 11.8 10*3/uL — AB (ref 4.0–10.5)

## 2016-05-02 LAB — GLUCOSE, CAPILLARY
GLUCOSE-CAPILLARY: 209 mg/dL — AB (ref 65–99)
GLUCOSE-CAPILLARY: 334 mg/dL — AB (ref 65–99)
Glucose-Capillary: 214 mg/dL — ABNORMAL HIGH (ref 65–99)
Glucose-Capillary: 217 mg/dL — ABNORMAL HIGH (ref 65–99)
Glucose-Capillary: 227 mg/dL — ABNORMAL HIGH (ref 65–99)
Glucose-Capillary: 238 mg/dL — ABNORMAL HIGH (ref 65–99)
Glucose-Capillary: 254 mg/dL — ABNORMAL HIGH (ref 65–99)
Glucose-Capillary: 282 mg/dL — ABNORMAL HIGH (ref 65–99)

## 2016-05-02 LAB — VANCOMYCIN, RANDOM: Vancomycin Rm: 22 ug/mL

## 2016-05-02 LAB — URINE CULTURE: CULTURE: NO GROWTH

## 2016-05-02 LAB — TSH: TSH: 0.494 u[IU]/mL (ref 0.350–4.500)

## 2016-05-02 LAB — TROPONIN I
TROPONIN I: 0.08 ng/mL — AB (ref <0.031)
Troponin I: 0.12 ng/mL — ABNORMAL HIGH (ref <0.031)

## 2016-05-02 MED ORDER — INSULIN ASPART 100 UNIT/ML ~~LOC~~ SOLN
0.0000 [IU] | Freq: Three times a day (TID) | SUBCUTANEOUS | Status: DC
  Administered 2016-05-02: 5 [IU] via SUBCUTANEOUS
  Administered 2016-05-02: 11 [IU] via SUBCUTANEOUS
  Administered 2016-05-03 (×3): 8 [IU] via SUBCUTANEOUS
  Administered 2016-05-04: 5 [IU] via SUBCUTANEOUS
  Administered 2016-05-04: 3 [IU] via SUBCUTANEOUS
  Administered 2016-05-04: 5 [IU] via SUBCUTANEOUS
  Administered 2016-05-05 – 2016-05-06 (×4): 3 [IU] via SUBCUTANEOUS
  Administered 2016-05-06: 5 [IU] via SUBCUTANEOUS

## 2016-05-02 MED ORDER — INSULIN ASPART 100 UNIT/ML ~~LOC~~ SOLN
0.0000 [IU] | Freq: Every day | SUBCUTANEOUS | Status: DC
  Administered 2016-05-02 – 2016-05-03 (×2): 3 [IU] via SUBCUTANEOUS
  Administered 2016-05-04: 4 [IU] via SUBCUTANEOUS
  Administered 2016-05-05: 3 [IU] via SUBCUTANEOUS

## 2016-05-02 NOTE — Progress Notes (Signed)
Pharmacy Antibiotic Note  [name] is a 70 y.o. male admitted on 05/01/2016 with sepsis.  [facility]  SCr is elevated above baseline.  Pt was on Vancomycin prior to this scheduled procedure.    BRIEF NARRATIVE: Patient brought over in acute distress from same day surgery unit. 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 Romeo AppleHarrison. Patient has been at [facility] rehabilitation. Patient was admitted April 28 for cellulitis of the left ankle. Is on IV Vanco and has a PICC line in his right arm. Patient was discovered by the same day surgery unit people to be unresponsive.  Vancomycin random level at 0445 this AM is 4622mcg/ml. Vancomycin 1gm last dose administered prior to level at 0747 05/01/16. Serum creatinine improving( 2.43-->1.6), true trough at 24 hours would have level around goal of 7720mcg/ml for osteomyelitis. Continue current dose.  Plan: Continue  Vancomycin 1000 IV every 24 hours.  Goal trough 15-20 mcg/mL.  Zosyn 3.375gm IV q8h, EID Monitor labs, renal fxn, progress and c/s F/U with repeat levels as warranted and adjust  Height: 5\' 9"  (175.3 cm) Weight: 153 lb 3.5 oz (69.5 kg) IBW/kg (Calculated) : 70.7  Temp (24hrs), Avg:98.5 F (36.9 C), Min:97.7 F (36.5 C), Max:99.3 F (37.4 C)   Recent Labs Lab 05/01/16 0710 05/01/16 0857 05/01/16 0906 05/01/16 1027 05/01/16 1945 05/02/16 0445  WBC 22.3*  --   --   --   --  11.8*  CREATININE 2.43*  --  2.20*  --  1.73* 1.60*  LATICACIDVEN  --  1.31  --  0.91  --   --   VANCORANDOM  --   --   --   --   --  22    Estimated Creatinine Clearance: 42.8 mL/min (by C-G formula based on Cr of 1.6).    No Known Allergies  Antimicrobials this admission: 5/11 Vanc >>  5/11 Zosyn >>   Dose adjustments this admission: n/a  Microbiology results: 5/11 BCx: no growth 5/11 UCx: pending  5/11 MRSA PCR: negative  Thank you for allowing  pharmacy to be a part of this patient's care.  Elder CyphersLorie Sarika Baldini, BS Pharm D, [place] Clinical Pharmacist Pager [phone] 05/02/2016 8:31 AM

## 2016-05-02 NOTE — Care Management Important Message (Signed)
Important Message  Patient Details  Name: [name] MRN: [id number] Date of Birth: 1946-01-18   Medicare Important Message Given:  Yes    Malcolm MetroChildress, Amery Vandenbos Demske, RN 05/02/2016, 2:32 PM

## 2016-05-02 NOTE — Progress Notes (Signed)
Patient with known ischemic cardiomyopathy status post revascularization and known osteomyelitis of left ankle currently on vancomycin for 10 days scheduled for elective surgery yesterday he ran multiple metabolic abnormalities including varying cardiac rhythms to include sinus rhythm with right bundle, junctional rhythm as well as atrial fibrillation with a moderate to slow ventricular response carvedilol has been removed as well as digoxin these may well be implicated in his bradycardic rhythms he likewise had hyperkalemia as well as acute kidney injury which has been rectified with fluid resuscitation currently appears in atrial fibrillation with ventricular response of about 50 bpm lactic acid is within normal parameters he is currently controlled on vancomycin and Zosyn empirically for septicemia be met and electrolytes will be monitored daily he has been restarted on moderate sliding scale insulin coverage to include at bedtime coverage MARKS SCALERA DXA:[id number] DOB: 01-10-1946 DOA: 05/01/2016 PCP: Maricela Curet, MD   Physical Exam: Blood pressure 99/82, pulse 49, temperature 98.8 F (37.1 C), resp. rate 23, height _0  (1.753 m), weight 69.5 kg (153 lb 3.5 oz), SpO2 100 %. Lungs show scattered rhonchi prolonged expiratory phase decreased breath sounds at the bases no rales no wheezes appreciable. Heart irregular regular no S3 auscultated no heaves thrills rubs    Investigations:  Recent Results (from the past 240 hour(s))  Culture, blood (Routine x 2)     Status: None (Preliminary result)   Collection Time: 05/01/16  7:29 AM  Result Value Ref Range Status   Specimen Description BLOOD  Final   Special Requests NONE  Final   Culture NO GROWTH < 12 HOURS  Final   Report Status PENDING  Incomplete  Culture, blood (Routine x 2)     Status: None (Preliminary result)   Collection Time: 05/01/16  7:35 AM  Result Value Ref Range Status   Specimen Description BLOOD  Final   Special  Requests NONE  Final   Culture NO GROWTH < 12 HOURS  Final   Report Status PENDING  Incomplete  Urine culture     Status: None   Collection Time: 05/01/16  8:30 AM  Result Value Ref Range Status   Specimen Description URINE, CATHETERIZED  Final   Special Requests NONE  Final   Culture NO GROWTH Performed at [facility]   Final   Report Status 05/02/2016 FINAL  Final  MRSA PCR Screening     Status: None   Collection Time: 05/01/16 11:32 AM  Result Value Ref Range Status   MRSA by PCR NEGATIVE NEGATIVE Final    Comment:        The GeneXpert MRSA Assay (FDA approved for NASAL specimens only), is one component of a comprehensive MRSA colonization surveillance program. It is not intended to diagnose MRSA infection nor to guide or monitor treatment for MRSA infections.      Basic Metabolic Panel:  Recent Labs  05/01/16 1945 05/02/16 0445  NA 137 132*  K 5.0 4.4  CL 106 100*  CO2 26 26  GLUCOSE 261* 273*  BUN 36* 34*  CREATININE 1.73* 1.60*  CALCIUM 7.5* 7.8*   Liver Function Tests:  Recent Labs  05/01/16 0710  AST 17  ALT 18  ALKPHOS 71  BILITOT 0.7  PROT 7.5  ALBUMIN 2.8*     CBC:  Recent Labs  05/01/16 0710 05/01/16 0906 05/02/16 0445  WBC 22.3*  --  11.8*  NEUTROABS 19.0*  --   --   HGB 12.6* 11.6* 10.0*  HCT 39.7 34.0* 31.2*  MCV 95.9  --  95.1  PLT 274  --  236    Dg Ankle 2 Views Left  05/01/2016  CLINICAL DATA:  Clinical history of osteomyelitis EXAM: LEFT ANKLE - 2 VIEW COMPARISON:  04/18/2016 FINDINGS: There again changes consistent with mild collapse of the navicular bone stable from the prior exam. Soft tissue irregularity is noted laterally over the lateral malleolus. There is now noted some increased erosion in this area consistent with the given clinical history of osteomyelitis. IMPRESSION: No acute fracture noted. Chronic changes in the navicular. Increased erosion in the distal aspect of the fibula consistent with  progressive osteomyelitis. Electronically Signed   By: Inez Catalina M.D.   On: 05/01/2016 09:08   Dg Chest Portable 1 View  05/01/2016  CLINICAL DATA:  Recently found slumped over awaiting surgical procedure, initial encounter EXAM: PORTABLE CHEST 1 VIEW COMPARISON:  04/23/2016 FINDINGS: Right-sided PICC line is again noted. Cardiac shadow is stable. Postsurgical changes are again seen. The lungs are clear bilaterally. No bony abnormality is noted. IMPRESSION: No acute abnormality noted. Electronically Signed   By: Inez Catalina M.D.   On: 05/01/2016 07:52      Medications:  Impression:  Active Problems:   Atrial fibrillation (HCC)   Chronic anticoagulation   Hyperkalemia   DKA, type 2 (HCC)   Chronic combined systolic and diastolic CHF, NYHA class 3 (HCC)   Troponin I above reference range   Hypoglycemia   Hypothermia   Hypotension   AKI (acute kidney injury) ([place])   Osteomyelitis of ankle (HCC)   Sepsis ([place])     Plan: Continue to hold carvedilol as well as digoxin. Cardiology consult requested decrease insulin to sliding scale moderate to include a chest coverage. Monitor bmet daily. Continue vancomycin and Zosyn as ordered patient is asymptomatic from varying bradycardia dysrhythmias. Result pacer outside of room. Wean levo fed if blood pressure and heart rate permit. Patient eating well alert and oriented continue Pradaxia as ordered. I will make further recommendations as the database expands. Hold long-acting Levemir at bedtime Consultants: Cardiology requested    Procedures   Antibiotics: Vancomycin and Zosyn         Time spent: 40 minutes   LOS: 1 day   Deshawnda Acrey M   05/02/2016, 1:38 PM

## 2016-05-02 NOTE — Care Management Note (Signed)
Case Management Note  Patient Details  Name: Cala Bradfordugene F Carnero MRN: [id number] Date of Birth: 10/21/1946  Subjective/Objective:                  [facility] [place] Plan for pt to return to SNF at DC. CSW is aware of DC plan and will make arrangements for return to facility when appropriate.   Action/Plan: No CM needs anticipated.   Expected Discharge Date:    05/09/2016              Expected Discharge Plan:  Skilled Nursing Facility  In-House Referral:  Clinical Social Work  Discharge planning Services  NA  Post Acute Care Choice:  NA Choice offered to:  Patient  DME Arranged:    DME Agency:     HH Arranged:    HH Agency:     Status of Service:  Completed, signed off  Medicare Important Message Given:  Yes Date Medicare IM Given:    Medicare IM give by:    Date Additional Medicare IM Given:    Additional Medicare Important Message give by:     If discussed at Long Length of Stay Meetings, dates discussed:    Additional Comments:  Malcolm MetroChildress, Gladies Sofranko Demske, RN 05/02/2016, 2:32 PM

## 2016-05-02 NOTE — Clinical Social Work Note (Signed)
Clinical Social Work Assessment  Patient Details  Name: [name] MRN: [id number] Date of Birth: [birth year]  Date of referral:  05/02/16               Reason for consult:  Facility Placement                Permission sought to share information with:    Permission granted to share information::     Name::        Agency::     Relationship::     Contact Information:     Housing/Transportation Living arrangements for the past 2 months:  Skilled Nursing Facility, Single Family Home Source of Information:  Patient, Adult Children Patient Interpreter Needed:  None Criminal Activity/Legal Involvement Pertinent to Current Situation/Hospitalization:  No - Comment as needed Significant Relationships:  Adult Children Lives with:  Facility Resident, Spouse Do you feel safe going back to the place where you live?  Yes Need for family participation in patient care:  Yes (Comment)  Care giving concerns:  None identified since being in the facility.    Social Worker assessment / plan:  Patient's daughter, [name], was at bedside.  She stated that patient has been at [facility] for eight days.  [name] advised that patient is at [facility] for IV antibiotics due to a wound on his ankle.  She stated that patient will have to have surgery on his ankle. [name] stated that patient uses a wheelchair and ADLs are completed by family.  She stated patient has 22 days left on his Medicare for SNF days but she does not know if she wants him to use them all now with the surgery pending.  [name] and patient were both agreeable to return to [facility] at discharge.    Employment status:  Retired Health and safety inspectornsurance information:  Medicare PT Recommendations:  Not assessed at this time Information / Referral to community resources:     Patient/Family's Response to care:  Patient and family are agreeable to return to [facility].   Patient/Family's Understanding of and Emotional Response to Diagnosis, Current Treatment, and Prognosis:  Patient and family are aware of patient's diagnosis, treatment and prognosis.   Emotional Assessment Appearance:  Developmentally appropriate Attitude/Demeanor/Rapport:   (Cooperative) Affect (typically observed):  Accepting, Calm Orientation:  Oriented to Self, Oriented to Situation Alcohol / Substance use:  Not Applicable Psych involvement (Current and /or in the community):  No (Comment)  Discharge Needs  Concerns to be addressed:   (Return to [facility]) Readmission within the last 30 days:  Yes Current discharge risk:  None Barriers to Discharge:  No Barriers Identified   Annice NeedySettle, Gennett Garcia D, LCSW 05/02/2016, 12:20 PM

## 2016-05-02 NOTE — Consult Note (Addendum)
Primary cardiologist: Dr Dina Rich  Consulting cardiologist: Dr Dina Rich Requesting Physician: Dr Janna Arch Indication: bradycardia  Clinical Summary Mr. Tappan is a 70 y.o.male with ICM, chronic systolic HF LVEF 16-10%, chronic CAD with angina and no revascularizable targets, afib, HL, and recent left ankle osteo admitted with bradycardia, AMS, hypoglycemia, AKI, and hypotensive. Cardiology is consulted for bradycardia. Patient himself denies any significant symptoms. No chest pain, no SOB, no lighheadedness or dizziness.   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    No Known Allergies  Medications Scheduled Medications: . chlorhexidine  60 mL Topical Once  . dabigatran  150 mg Oral BID  . insulin aspart  0-15 Units Subcutaneous TID WC  . insulin aspart  0-5 Units Subcutaneous QHS  . piperacillin-tazobactam (ZOSYN)  IV  3.375 g Intravenous Q8H  . ranolazine  1,000 mg Oral BID  . sodium chloride  1,000 mL Intravenous Once   And  . sodium chloride  1,000 mL Intravenous Once  . sodium chloride flush  3 mL Intravenous Q12H  . vancomycin  1,000 mg Intravenous Q24H     Infusions: . sodium chloride 125 mL/hr at 05/02/16 1300  . norepinephrine (LEVOPHED) Adult infusion 6 mcg/min (05/02/16 0925)     PRN Medications:  acetaminophen **OR** acetaminophen, ondansetron **OR** ondansetron (ZOFRAN) IV, oxyCODONE, polyethylene glycol   Past Medical History  Diagnosis Date  . Coronary atherosclerosis of native coronary artery     Ant. MI in 4/95; PTCA of 90% prox & distal LAD unsuccessful-->  Urgent CABG-4/95 TO Cx; 50% RCA; ant. HK; EF of 50-55%; 08/2008: 3-V disease plus a  95% LIMA stenosis, treated with DES; occlusion of SVG to CX; RCA SVG stenosis--> PCI with DES  2010: in-stent restenosis in the LIMA-->cutting balloon; EF of  35-40%;  07/2010: TO of SVG to RCA-medical therapy advised  . Atrial fibrillation (HCC)     1995, 2009; bradycardia with beta blocker therapy  . Peripheral vascular disease (HCC) 1995    Abdominal aortic obstruction 1995-->vascular surgery  . Hyperlipidemia   . Type 2 diabetes mellitus (HCC)   . DDD (degenerative disc disease)     Discectomy and fusion at L4 and L5  . History of alcohol abuse     Quit in 1999  . Chronic systolic heart failure (HCC)   . Non-compliance   . Ischemic cardiomyopathy     LVEF 40-45% August 2016    Past Surgical History  Procedure Laterality Date  . Cholecystectomy  1995  . Lumbar fusion      +Discectomy;x2; L4 and L5  . Aorto-femoral bypass graft  1995  . Left heart catheterization with coronary angiogram N/A 12/14/2013    Procedure: LEFT HEART CATHETERIZATION WITH CORONARY ANGIOGRAM;  Surgeon: Micheline Chapman, MD;  Location: [facility] CATH LAB;  Service: Cardiovascular;  Laterality: N/A;  . Back surgery      Family History  Problem Relation Age of Onset  . CAD Father     Social History Mr. Kuzel reports that he has quit smoking. His smoking use included Cigarettes. He has a 20 pack-year smoking history. He uses smokeless tobacco. Mr. Trapp reports that he does not drink alcohol.  Review of Systems CONSTITUTIONAL: No weight loss, fever, chills, weakness or fatigue.  HEENT: Eyes: No visual loss, blurred vision, double vision or yellow sclerae. No hearing loss,  sneezing, congestion, runny nose or sore throat.  SKIN: No rash or itching.  CARDIOVASCULAR: No chest pain, chest pressure or chest discomfort. No palpitations or edema.  RESPIRATORY: No shortness of breath, cough or sputum.  GASTROINTESTINAL: No anorexia, nausea, vomiting or diarrhea. No abdominal pain or blood.  GENITOURINARY: no polyuria, no dysuria NEUROLOGICAL: No headache, dizziness, syncope, paralysis, ataxia, numbness or tingling in the extremities. No change in bowel or bladder  control.  MUSCULOSKELETAL: No muscle, back pain, joint pain or stiffness.  HEMATOLOGIC: No anemia, bleeding or bruising.  LYMPHATICS: No enlarged nodes. No history of splenectomy.  PSYCHIATRIC: No history of depression or anxiety.      Physical Examination Blood pressure 99/82, pulse 49, temperature 98.8 F (37.1 C), resp. rate 23, height 5\' 9"  (1.753 m), weight 153 lb 3.5 oz (69.5 kg), SpO2 100 %.  Intake/Output Summary (Last 24 hours) at 05/02/16 1405 Last data filed at 05/02/16 1300  Gross per 24 hour  Intake 4187.45 ml  Output   1675 ml  Net 2512.45 ml    HEENT: sclera clear, throat clear  Cardiovascular: irreg, rate 50, 2/6 systolic murmur LLSB, no JVD  Respiratory: CTAB  GI: abdomen soft, NT, ND  MSK: no LE edema  Neuro: no focal deficits  Psych: appropriate affect   Lab Results  Basic Metabolic Panel:  Recent Labs Lab 05/01/16 0710 05/01/16 0906 05/01/16 1945 05/02/16 0445  NA 139 140 137 132*  K 6.5* 5.5* 5.0 4.4  CL 103 104 106 100*  CO2 26  --  26 26  GLUCOSE 95 221* 261* 273*  BUN 46* 37* 36* 34*  CREATININE 2.43* 2.20* 1.73* 1.60*  CALCIUM 8.9  --  7.5* 7.8*    Liver Function Tests:  Recent Labs Lab 05/01/16 0710  AST 17  ALT 18  ALKPHOS 71  BILITOT 0.7  PROT 7.5  ALBUMIN 2.8*    CBC:  Recent Labs Lab 05/01/16 0710 05/01/16 0906 05/02/16 0445  WBC 22.3*  --  11.8*  NEUTROABS 19.0*  --   --   HGB 12.6* 11.6* 10.0*  HCT 39.7 34.0* 31.2*  MCV 95.9  --  95.1  PLT 274  --  236    Cardiac Enzymes:  Recent Labs Lab 05/01/16 0710 05/01/16 1200 05/01/16 1730 05/01/16 2318  TROPONINI 0.04* 0.31* 0.58* 0.40*    BNP: Invalid input(s): POCBNP     Impression/Recommendations  1. Bradycardia -likely medication induced in setting of coreg and digoxin at home. Effects of digoxin likely exacerbated by AKI. It will take probably 2-3 days for these meds to washout. Currently afib rate low 50s which is acceptable, if more  significant bradycardia can try atropine 0.5mg  prn, or if persistent change his low dose levophed drip to dopamine, he has central access with a PICC line.  - depending on heart rates during this admission would consider retrial of low dose coreg, would not resume digoxin at any point. He has one EKG with an RBBB pattern without clear P waves that could represent an accelerated idioventricular rhythm, this combined with junctional bradycardia is concerning for digoxin as the etiology despite his normal digoxin level.   - if recurrent issues with bradycardia over the weekend please contact the [facility] cardiology on call. I suspect as coreg and digoxin clear his system his heart rates will improve. He has central access with options for IV dopamine if indicated. I do not suspect he will utlimately need a pacemaker.   2. Troponin elevation -  mild troponin elevation in patient with known CAD without revascularization targets presented with probable sepsis and hypotension. Probable demand ischemia in the setting of chronic obstructive CAD. Troponin trending down, continue medical therapy. Would not plan for invasive testing due to likely demand as etiology and known poor revasc targets  3. Afib - coreg and digoxin on hold, he remains on pradaxa for stroke prevention. CHADS2Vasc score of 4 - no av nodal agents at this time given bradycardia.   4. Hypotension - evidence of significant hypovolemia based on labs, bp's have improved some with fluid resuscitation but still with low dose pressor requirement, likely some systemic vasodilatation related to sepsis.  -Do not suspect cardiogenic cause at this time. Monitor fluid resuscitation closely to avoid CHF.  - he has PICC line, can transduce CVP if neccesary if fluid status becomes a question. Currently he remains hypovolemic by exam and labs.   5. Chronic systolic HF - medications on hold in setting of bradycardia and hypotensive, he is cleary  hypovolemic and thus diuretics on hold - monitor volume status with IVF resuscitation.   Dina Rich, M.D.

## 2016-05-02 NOTE — NC FL2 (Signed)
[place] MEDICAID FL2 LEVEL OF CARE SCREENING TOOL     IDENTIFICATION  Patient Name: [name] Birthdate: May 03, 1946 Sex: male Admission Date (Current Location): 05/01/2016  [facility] [place]   Facility and Address:  [facility],  [address], [place] [id number]      Provider Number: [id number]  Attending Physician Name and Address:  Oval Linseyichard Dondiego, MD  Relative Name and Phone Number:       Current Level of Care: Hospital Recommended Level of Care: Skilled Nursing Facility Prior Approval Number:    Date Approved/Denied:   PASRR Number:  [id number])  Discharge Plan: SNF    Current Diagnoses: Patient Active Problem List   Diagnosis Date Noted  . Sepsis (HCC) 05/01/2016  . Pressure ulcer 04/19/2016  . Osteomyelitis of ankle (HCC) 04/18/2016  . Osteomyelitis (HCC) 04/18/2016  . Hypoglycemia 12/12/2015  . SIRS (systemic inflammatory response syndrome) (HCC) 12/12/2015  . Hypothermia 12/12/2015  . Hypotension 12/12/2015  . AKI (acute kidney injury) (HCC)   . Troponin I above reference range 08/09/2015  . Ischemic chest pain (HCC) 08/09/2015  . Syncope 06/07/2014  . Hypotension due to drugs 05/04/2014  . Syncope and collapse 05/04/2014  . DKA, type 2 (HCC) 04/05/2014  . DKA (diabetic ketoacidoses) (HCC) 04/05/2014  . Chronic combined systolic and diastolic CHF, NYHA class 3 (HCC) 04/05/2014  . Hyperkalemia 03/18/2014  . Non-compliance   . Chronic anticoagulation 04/13/2013  . History of diagnostic tests 04/13/2013  . Fall 04/13/2013  . Arteriosclerotic cardiovascular disease (ASCVD)   . Atrial fibrillation (HCC)   . DDD (degenerative disc disease)   . Alcohol use (HCC)   . Hyperlipidemia 05/06/2010  . TOBACCO ABUSE 05/06/2010  . PERIPHERAL VASCULAR DISEASE 05/06/2010    Orientation RESPIRATION BLADDER Height & Weight     Self, Situation  Normal Incontinent Weight: 153 lb 3.5 oz (69.5 kg) Height:  5\' 9"   (175.3 cm)  BEHAVIORAL SYMPTOMS/MOOD NEUROLOGICAL BOWEL NUTRITION STATUS      Incontinent Diet (Carb Modified)  AMBULATORY STATUS COMMUNICATION OF NEEDS Skin   Extensive Assist Verbally PU Stage and Appropriate Care (left hip)                       Personal Care Assistance Level of Assistance  Bathing, Feeding, Dressing Bathing Assistance: Maximum assistance Feeding assistance: Maximum assistance Dressing Assistance: Maximum assistance     Functional Limitations Info  Sight, Hearing, Speech Sight Info: Adequate Hearing Info: Adequate Speech Info: Adequate    SPECIAL CARE FACTORS FREQUENCY                       Contractures      Additional Factors Info  Insulin Sliding Scale, Code Status Code Status Info:  (Full Code)     Insulin Sliding Scale Info:  (3 times daily with meals)       Current Medications (05/02/2016):  This is the current hospital active medication list Current Facility-Administered Medications  Medication Dose Route Frequency Provider Last Rate Last Dose  . 0.9 %  sodium chloride infusion   Intravenous Continuous Standley Brookinganiel P Goodrich, MD 125 mL/hr at 05/02/16 1150    . acetaminophen (TYLENOL) tablet 650 mg  650 mg Oral Q6H PRN Standley Brookinganiel P Goodrich, MD       Or  . acetaminophen (TYLENOL) suppository 650 mg  650 mg Rectal Q6H PRN Standley Brookinganiel P Goodrich, MD      . chlorhexidine (HIBICLENS) 4 %  liquid 4 application  60 mL Topical Once Vickki Hearing, MD   Stopped at 05/01/16 1252  . dabigatran (PRADAXA) capsule 150 mg  150 mg Oral BID Standley Brooking, MD   150 mg at 05/02/16 0940  . insulin aspart (novoLOG) injection 0-15 Units  0-15 Units Subcutaneous TID WC Oval Linsey, MD   11 Units at 05/02/16 1147  . insulin aspart (novoLOG) injection 0-5 Units  0-5 Units Subcutaneous QHS Oval Linsey, MD      . norepinephrine (LEVOPHED) 4 mg in dextrose 5 % 250 mL (0.016 mg/mL) infusion  0-40 mcg/min Intravenous Titrated Vanetta Mulders, MD 22.5 mL/hr at  05/02/16 0925 6 mcg/min at 05/02/16 0925  . ondansetron (ZOFRAN) tablet 4 mg  4 mg Oral Q6H PRN Standley Brooking, MD       Or  . ondansetron [facility]) injection 4 mg  4 mg Intravenous Q6H PRN Standley Brooking, MD      . oxyCODONE (Oxy IR/ROXICODONE) immediate release tablet 10 mg  10 mg Oral QID PRN Standley Brooking, MD   10 mg at 05/01/16 2152  . piperacillin-tazobactam (ZOSYN) IVPB 3.375 g  3.375 g Intravenous Q8H Vanetta Mulders, MD   3.375 g at 05/02/16 0800  . polyethylene glycol (MIRALAX / GLYCOLAX) packet 17 g  17 g Oral Daily PRN Standley Brooking, MD      . ranolazine (RANEXA) 12 hr tablet 1,000 mg  1,000 mg Oral BID Standley Brooking, MD   1,000 mg at 05/02/16 0940  . sodium chloride 0.9 % bolus 1,000 mL  1,000 mL Intravenous Once Vanetta Mulders, MD       And  . sodium chloride 0.9 % bolus 1,000 mL  1,000 mL Intravenous Once Vanetta Mulders, MD      . sodium chloride flush (NS) 0.9 % injection 3 mL  3 mL Intravenous Q12H Standley Brooking, MD   3 mL at 05/02/16 0941  . vancomycin (VANCOCIN) IVPB 1000 mg/200 mL premix  1,000 mg Intravenous Q24H Standley Brooking, MD   1,000 mg at 05/02/16 0631     Discharge Medications: Please see discharge summary for a list of discharge medications.  Relevant Imaging Results:  Relevant Lab Results:   Additional Information    Zeva Leber, Juleen China, LCSW

## 2016-05-03 LAB — BASIC METABOLIC PANEL
ANION GAP: 4 — AB (ref 5–15)
BUN: 27 mg/dL — ABNORMAL HIGH (ref 6–20)
CO2: 25 mmol/L (ref 22–32)
Calcium: 7.4 mg/dL — ABNORMAL LOW (ref 8.9–10.3)
Chloride: 100 mmol/L — ABNORMAL LOW (ref 101–111)
Creatinine, Ser: 1.26 mg/dL — ABNORMAL HIGH (ref 0.61–1.24)
GFR calc Af Amer: >60 mL/min (ref >60)
GFR calc non Af Amer: 57 mL/min — ABNORMAL LOW (ref >60)
GLUCOSE: 304 mg/dL — AB (ref 65–99)
POTASSIUM: 4.3 mmol/L (ref 3.5–5.1)
Sodium: 129 mmol/L — ABNORMAL LOW (ref 135–145)

## 2016-05-03 LAB — GLUCOSE, CAPILLARY
GLUCOSE-CAPILLARY: 293 mg/dL — AB (ref 65–99)
GLUCOSE-CAPILLARY: 256 mg/dL — AB (ref 65–99)
Glucose-Capillary: 286 mg/dL — ABNORMAL HIGH (ref 65–99)
Glucose-Capillary: 276 mg/dL — ABNORMAL HIGH (ref 65–99)

## 2016-05-03 LAB — TROPONIN I: TROPONIN I: 0.06 ng/mL — AB (ref <0.031)

## 2016-05-03 NOTE — Progress Notes (Signed)
Patient admitted with a myriad of issues most notably bradycardia dysrhythmias considering junctional rhythm A. fib with a slow ventricular response which is chronic, nation of AV nodal blocking agents of Coreg and digoxin has possibly causative. She has osteomyelitis currently on dual antibiotics his acute kidney injury and hyperkalemia have resolved with aggressive fluid resuscitation N has to leave her Coreg and digoxin off monitor bradycardia dysrhythmias and AV nodal blocking agents A lites and  renal insufficiency LORANZO DESHA JSE:[id number] DOB: 1946-03-19 DOA: 05/01/2016 PCP: Maricela Curet, MD   Physical Exam: Blood pressure 141/65, pulse 48, temperature 97.3 F (36.3 C), temperature source Oral, resp. rate 21, height '5\' 9"'$  (1.753 m), weight 72.3 kg (159 lb 6.3 oz), SpO2 100 %. Lungs show prolonged expiratory phase scattered rhonchi diminished breath sounds in the bases no rales no wheezes appreciable heart irregular regular no S3 auscultated no heaves thrills rubs   Investigations:  Recent Results (from the past 240 hour(s))  Culture, blood (Routine x 2)     Status: None (Preliminary result)   Collection Time: 05/01/16  7:29 AM  Result Value Ref Range Status   Specimen Description BLOOD  Final   Special Requests NONE  Final   Culture NO GROWTH < 12 HOURS  Final   Report Status PENDING  Incomplete  Culture, blood (Routine x 2)     Status: None (Preliminary result)   Collection Time: 05/01/16  7:35 AM  Result Value Ref Range Status   Specimen Description BLOOD  Final   Special Requests NONE  Final   Culture NO GROWTH < 12 HOURS  Final   Report Status PENDING  Incomplete  Urine culture     Status: None   Collection Time: 05/01/16  8:30 AM  Result Value Ref Range Status   Specimen Description URINE, CATHETERIZED  Final   Special Requests NONE  Final   Culture NO GROWTH Performed at [facility]   Final   Report Status 05/02/2016 FINAL  Final  MRSA PCR Screening      Status: None   Collection Time: 05/01/16 11:32 AM  Result Value Ref Range Status   MRSA by PCR NEGATIVE NEGATIVE Final    Comment:        The GeneXpert MRSA Assay (FDA approved for NASAL specimens only), is one component of a comprehensive MRSA colonization surveillance program. It is not intended to diagnose MRSA infection nor to guide or monitor treatment for MRSA infections.      Basic Metabolic Panel:  Recent Labs  05/02/16 0445 05/03/16 0455  NA 132* 129*  K 4.4 4.3  CL 100* 100*  CO2 26 25  GLUCOSE 273* 304*  BUN 34* 27*  CREATININE 1.60* 1.26*  CALCIUM 7.8* 7.4*   Liver Function Tests:  Recent Labs  05/01/16 0710  AST 17  ALT 18  ALKPHOS 71  BILITOT 0.7  PROT 7.5  ALBUMIN 2.8*     CBC:  Recent Labs  05/01/16 0710 05/01/16 0906 05/02/16 0445  WBC 22.3*  --  11.8*  NEUTROABS 19.0*  --   --   HGB 12.6* 11.6* 10.0*  HCT 39.7 34.0* 31.2*  MCV 95.9  --  95.1  PLT 274  --  236    No results found.    Medications: Continue dual antibiotics switched from Levothroid to dopamine for hypotension persists or if bradycardia dysrhythmias persist monitor electrolytes  Impression:  Active Problems:   Atrial fibrillation (HCC)   Chronic anticoagulation   Hyperkalemia  DKA, type 2 ([place])   Chronic combined systolic and diastolic CHF, NYHA class 3 (HCC)   Troponin I above reference range   Hypoglycemia   Hypothermia   Hypotension   AKI (acute kidney injury) ([place])   Osteomyelitis of ankle (HCC)   Sepsis (HCC)     Plan: Continue dual antibiotics switched from Levothroid to dopamine for hypotension or if bradycardia dysrhythmias persist. Monitor electrolytes daily. No AV nodal blocking agents.  Consultants: Cardiology   Procedures  Antibiotics: Vancomycin and Zosyn          Time spent: 30 minutes  LOS: 2 days   Cristin Penaflor M   05/03/2016, 9:27 AM

## 2016-05-04 LAB — BASIC METABOLIC PANEL
ANION GAP: 4 — AB (ref 5–15)
BUN: 20 mg/dL (ref 6–20)
CO2: 25 mmol/L (ref 22–32)
Calcium: 7.5 mg/dL — ABNORMAL LOW (ref 8.9–10.3)
Chloride: 102 mmol/L (ref 101–111)
Creatinine, Ser: 1.15 mg/dL (ref 0.61–1.24)
Glucose, Bld: 152 mg/dL — ABNORMAL HIGH (ref 65–99)
POTASSIUM: 4 mmol/L (ref 3.5–5.1)
SODIUM: 131 mmol/L — AB (ref 135–145)

## 2016-05-04 LAB — GLUCOSE, CAPILLARY
GLUCOSE-CAPILLARY: 190 mg/dL — AB (ref 65–99)
GLUCOSE-CAPILLARY: 221 mg/dL — AB (ref 65–99)
GLUCOSE-CAPILLARY: 306 mg/dL — AB (ref 65–99)
Glucose-Capillary: 221 mg/dL — ABNORMAL HIGH (ref 65–99)

## 2016-05-04 NOTE — Progress Notes (Signed)
Patient returned to his baseline atrial fibrillation with a narrow complex per ECG no evidence of clinical ischemia angina anginal equivalents orthopnea or PND renal function as well as electrolytes have returned to baseline normal patient remains mildly hypotensive [name] [id number]:[id number] DOB: [birth year] DOA: 05/01/2016 PCP: Vincent StallingNDIEGO,Vincent Dilmore M, MD   Physical Exam: Blood pressure 112/64, pulse 72, temperature 97 F (36.1 C), temperature source Oral, resp. rate 17, height 5\' 9"  (1.753 [name]), weight 74.9 kg (165 lb 2 oz), SpO2 100 %. Lungs diminished breath sounds in bases prolonged expiratory phase no rales wheeze rhonchi appreciable  rhythm heart irregular rhythm no S3 no heaves thrills or rubs   Investigations:  Recent Results (from the past 240 hour(s))  Culture, blood (Routine x 2)     Status: None (Preliminary result)   Collection Time: 05/01/16  7:29 AM  Result Value Ref Range Status   Specimen Description BLOOD PICC LINE DRAWN BY RN  Final   Special Requests BOTTLES DRAWN AEROBIC AND ANAEROBIC 10CC EACH  Final   Culture NO GROWTH 3 DAYS  Final   Report Status PENDING  Incomplete  Culture, blood (Routine x 2)     Status: None (Preliminary result)   Collection Time: 05/01/16  7:35 AM  Result Value Ref Range Status   Specimen Description BLOOD LEFT ARM  Final   Special Requests BOTTLES DRAWN AEROBIC AND ANAEROBIC 10CC  Final   Culture NO GROWTH 3 DAYS  Final   Report Status PENDING  Incomplete  Urine culture     Status: None   Collection Time: 05/01/16  8:30 AM  Result Value Ref Range Status   Specimen Description URINE, CATHETERIZED  Final   Special Requests NONE  Final   Culture NO GROWTH [facility]   Final   Report Status 05/02/2016 FINAL  Final  MRSA PCR Screening     Status: None   Collection Time: 05/01/16 11:32 AM  Result Value Ref Range Status   MRSA by PCR NEGATIVE NEGATIVE Final    Comment:        The GeneXpert MRSA Assay (FDA approved  for NASAL specimens only), is one component of a comprehensive MRSA colonization surveillance program. It is not intended to diagnose MRSA infection nor to guide or monitor treatment for MRSA infections.      Basic Metabolic Panel:  Recent Labs  78/46/9605/13/17 0455 05/04/16 0500  NA 129* 131*  K 4.3 4.0  CL 100* 102  CO2 25 25  GLUCOSE 304* 152*  BUN 27* 20  CREATININE 1.26* 1.15  CALCIUM 7.4* 7.5*   Liver Function Tests: No results for input(s): AST, ALT, ALKPHOS, BILITOT, PROT, ALBUMIN in the last 72 hours.   CBC:  Recent Labs  05/02/16 0445  WBC 11.8*  HGB 10.0*  HCT 31.2*  MCV 95.1  PLT 236    No results found.    Medications:   Impression:  Active Problems:   Atrial fibrillation (HCC)   Chronic anticoagulation   Hyperkalemia   DKA, type 2 (HCC)   Chronic combined systolic and diastolic CHF, NYHA class 3 (HCC)   Troponin I above reference range   Hypoglycemia   Hypothermia   Hypotension   AKI (acute kidney injury) (HCC)   Osteomyelitis of ankle (HCC)   Sepsis (HCC)     Plan: Continue antibiotics. No evidence of clinical septicemia at present monitor renal function and cardiac rhythms as well as hemodynamics   Consultants: Cardiology  Procedures   Antibiotics:  Vancomycin and Zosyn     Time spent: 30 minutes   LOS: 3 days   [name]   05/04/2016, 12:04 PM

## 2016-05-05 ENCOUNTER — Ambulatory Visit: Payer: Medicare Other | Admitting: Orthopedic Surgery

## 2016-05-05 DIAGNOSIS — I9589 Other hypotension: Secondary | ICD-10-CM

## 2016-05-05 DIAGNOSIS — I25709 Atherosclerosis of coronary artery bypass graft(s), unspecified, with unspecified angina pectoris: Secondary | ICD-10-CM

## 2016-05-05 DIAGNOSIS — I5042 Chronic combined systolic (congestive) and diastolic (congestive) heart failure: Secondary | ICD-10-CM

## 2016-05-05 DIAGNOSIS — I248 Other forms of acute ischemic heart disease: Secondary | ICD-10-CM

## 2016-05-05 DIAGNOSIS — Z7901 Long term (current) use of anticoagulants: Secondary | ICD-10-CM

## 2016-05-05 LAB — BASIC METABOLIC PANEL
Anion gap: 3 — ABNORMAL LOW (ref 5–15)
BUN: 17 mg/dL (ref 6–20)
CHLORIDE: 106 mmol/L (ref 101–111)
CO2: 24 mmol/L (ref 22–32)
Calcium: 7.5 mg/dL — ABNORMAL LOW (ref 8.9–10.3)
Creatinine, Ser: 1.07 mg/dL (ref 0.61–1.24)
GFR calc Af Amer: >60 mL/min (ref >60)
GFR calc non Af Amer: >60 mL/min (ref >60)
Glucose, Bld: 134 mg/dL — ABNORMAL HIGH (ref 65–99)
POTASSIUM: 3.8 mmol/L (ref 3.5–5.1)
SODIUM: 133 mmol/L — AB (ref 135–145)

## 2016-05-05 LAB — GLUCOSE, CAPILLARY
GLUCOSE-CAPILLARY: 174 mg/dL — AB (ref 65–99)
GLUCOSE-CAPILLARY: 254 mg/dL — AB (ref 65–99)
Glucose-Capillary: 154 mg/dL — ABNORMAL HIGH (ref 65–99)
Glucose-Capillary: 188 mg/dL — ABNORMAL HIGH (ref 65–99)

## 2016-05-05 LAB — VANCOMYCIN, TROUGH: VANCOMYCIN TR: 15 ug/mL (ref 10.0–20.0)

## 2016-05-05 MED FILL — Medication: Qty: 1 | Status: AC

## 2016-05-05 NOTE — Progress Notes (Signed)
Pharmacy Antibiotic Note  [name] is a 70 y.o. male admitted on 05/01/2016 with sepsis.  [facility].  SCr is elevated above baseline.  Pt was on Vancomycin prior to this scheduled procedure.    BRIEF NARRATIVE: Patient brought over in acute distress from same day surgery unit. 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 Romeo AppleHarrison. Patient has been at [facility]. Patient was admitted April 28 for cellulitis of the left ankle. Is on IV Vanco and has a PICC line in his right arm. Patient was discovered by the same day surgery unit people to be unresponsive.  Vancomycin trough level at 0435 this AM is 3215mcg/ml. Serum creatinine improving( 2.43-->1.6-->1.07)/ Therapeutic. Continue current dose.  Plan: Continue  Vancomycin 1000 IV every 24 hours.  Goal trough 15-20 mcg/mL.  Zosyn 3.375gm IV q8h, EID Monitor labs, renal fxn, progress and c/s F/U with repeat levels as warranted and adjust  Height: 5\' 9"  (175.3 cm) Weight: 167 lb 15.9 oz (76.2 kg) IBW/kg (Calculated) : 70.7  Temp (24hrs), Avg:97.4 F (36.3 C), Min:96.3 F (35.7 C), Max:98.1 F (36.7 C)   Recent Labs Lab 05/01/16 0710 05/01/16 0857  05/01/16 1027 05/01/16 1945 05/02/16 0445 05/03/16 0455 05/04/16 0500 05/05/16 0435  WBC 22.3*  --   --   --   --  11.8*  --   --   --   CREATININE 2.43*  --   < >  --  1.73* 1.60* 1.26* 1.15 1.07  LATICACIDVEN  --  1.31  --  0.91  --   --   --   --   --   VANCOTROUGH  --   --   --   --   --   --   --   --  15  VANCORANDOM  --   --   --   --   --  22  --   --   --   < > = values in this interval not displayed.  Estimated Creatinine Clearance: 65.2 mL/min (by C-G formula based on Cr of 1.07).    No Known Allergies  Antimicrobials this admission: 5/11 Vanc >>  5/11 Zosyn >>   Dose adjustments this admission: n/a  Microbiology results: 5/11 BCx: no growth 5/11 UCx:  no growth 5/11 MRSA PCR: negative  Thank you for allowing pharmacy to be a part of this patient's care.  Elder CyphersLorie Ivyonna Hoelzel, BS Loura Backharm D, [place] Clinical Pharmacist Pager [phone] 05/05/2016 9:34 AM

## 2016-05-05 NOTE — Progress Notes (Signed)
Inpatient Diabetes Program Recommendations  AACE/ADA: New Consensus Statement on Inpatient Glycemic Control (2015)  Target Ranges:  Prepandial:   less than 140 mg/dL      Peak postprandial:   less than 180 mg/dL (1-2 hours)      Critically ill patients:  140 - 180 mg/dL   Review of Glycemic Control: Results for [name], [name] (MRN [id number]) as of 05/05/2016 09:55  Ref. Range 05/04/2016 07:22 05/04/2016 11:19 05/04/2016 15:57 05/04/2016 21:12 05/05/2016 07:20  Glucose-Capillary Latest Ref Range: 65-99 mg/dL 295190 (H) 621221 (H) 308221 (H) 306 (H) 154 (H)    Diabetes history: DM 2 Outpatient Diabetes medications: Novolog 20 units TID, Levemir 40-50 units Daily  Current orders for Inpatient glycemic control: Novolog moderate tid with meals and HS  Inpatient Diabetes Program Recommendations:  Please consider restarting a portion of patient's home dose of basal insulin such as Levemir 15 units daily.  Also may consider adding Novolog meal coverage 5 units tid with meals (hold if patient eats less than 50%).  Thanks, Beryl MeagerJenny Thoma Paulsen, RN, BC-ADM Inpatient Diabetes Coordinator Pager [phone] (8a-5p)

## 2016-05-05 NOTE — Care Management Important Message (Signed)
Important Message  Patient Details  Name: [name] MRN: [id number] Date of Birth: 1946/12/02   Medicare Important Message Given:  Yes    Malcolm MetroChildress, Jacquelynne Guedes Demske, RN 05/05/2016, 2:39 PM

## 2016-05-05 NOTE — Progress Notes (Signed)
SUBJECTIVE: Denies chest pain and shortness of breath. Off pressors.  ECG today shows rate-controlled atrial fibrillation.   ROS: Other than pertinent positives in "Subjective", all others were reviewed and found to be negative.   Intake/Output Summary (Last 24 hours) at 05/05/16 0847 Last data filed at 05/05/16 0600  Gross per 24 hour  Intake 3595.83 ml  Output   3600 ml  Net  -4.17 ml    Current Facility-Administered Medications  Medication Dose Route Frequency Provider Last Rate Last Dose  . 0.9 %  sodium chloride infusion   Intravenous Continuous Standley Brooking, MD 125 mL/hr at 05/04/16 1238 125 mL/hr at 05/04/16 1238  . acetaminophen (TYLENOL) tablet 650 mg  650 mg Oral Q6H PRN Standley Brooking, MD       Or  . acetaminophen (TYLENOL) suppository 650 mg  650 mg Rectal Q6H PRN Standley Brooking, MD      . chlorhexidine (HIBICLENS) 4 % liquid 4 application  60 mL Topical Once Vickki Hearing, MD   Stopped at 05/01/16 1252  . dabigatran (PRADAXA) capsule 150 mg  150 mg Oral BID Standley Brooking, MD   150 mg at 05/05/16 0743  . insulin aspart (novoLOG) injection 0-15 Units  0-15 Units Subcutaneous TID WC Oval Linsey, MD   3 Units at 05/05/16 [phone]  . insulin aspart (novoLOG) injection 0-5 Units  0-5 Units Subcutaneous QHS Oval Linsey, MD   4 Units at 05/04/16 2133  . norepinephrine (LEVOPHED) 4 mg in dextrose 5 % 250 mL (0.016 mg/mL) infusion  0-40 mcg/min Intravenous Titrated Vanetta Mulders, MD   Stopped at 05/03/16 1100  . ondansetron (ZOFRAN) tablet 4 mg  4 mg Oral Q6H PRN Standley Brooking, MD       Or  . ondansetron [facility]) injection 4 mg  4 mg Intravenous Q6H PRN Standley Brooking, MD      . oxyCODONE (Oxy IR/ROXICODONE) immediate release tablet 10 mg  10 mg Oral QID PRN Standley Brooking, MD   10 mg at 05/04/16 2000  . piperacillin-tazobactam (ZOSYN) IVPB 3.375 g  3.375 g Intravenous Q8H Vanetta Mulders, MD   3.375 g at 05/05/16 0751  .  polyethylene glycol (MIRALAX / GLYCOLAX) packet 17 g  17 g Oral Daily PRN Standley Brooking, MD      . ranolazine (RANEXA) 12 hr tablet 1,000 mg  1,000 mg Oral BID Standley Brooking, MD   1,000 mg at 05/05/16 0742  . sodium chloride 0.9 % bolus 1,000 mL  1,000 mL Intravenous Once Vanetta Mulders, MD       And  . sodium chloride 0.9 % bolus 1,000 mL  1,000 mL Intravenous Once Vanetta Mulders, MD      . sodium chloride flush (NS) 0.9 % injection 3 mL  3 mL Intravenous Q12H Standley Brooking, MD   3 mL at 05/04/16 2134  . vancomycin (VANCOCIN) IVPB 1000 mg/200 mL premix  1,000 mg Intravenous Q24H Standley Brooking, MD   1,000 mg at 05/05/16 0649    Filed Vitals:   05/05/16 0300 05/05/16 0400 05/05/16 0500 05/05/16 0600  BP: 116/69 105/66 115/65 127/59  Pulse: 62 61 63 67  Temp: 96.8 F (36 C) 96.6 F (35.9 C) 96.3 F (35.7 C) 96.6 F (35.9 C)  TempSrc:      Resp: 19 20 19 21   Height:      Weight:   167 lb 15.9 oz (76.2 kg)  SpO2: 100% 100% 100% 100%    PHYSICAL EXAM General: NAD HEENT: Normal. Neck: No JVD, no thyromegaly.  Lungs: Clear to auscultation bilaterally with normal respiratory effort. CV: Nondisplaced PMI.  Regular rate and irregular rhythm, normal S1/S2, no S3, Soft systolic murmur along LSB.  No pretibial edema.  Abdomen: Soft, nontender, no distention.  Neurologic: Alert.  Psych: Normal affect. Musculoskeletal: Left ankle bandaged.  TELEMETRY: Reviewed telemetry pt in rate controlled atrial fibrillation, occasional PVC's.  LABS: Basic Metabolic Panel:  Recent Labs  81/19/14 0500 05/05/16 0435  NA 131* 133*  K 4.0 3.8  CL 102 106  CO2 25 24  GLUCOSE 152* 134*  BUN 20 17  CREATININE 1.15 1.07  CALCIUM 7.5* 7.5*   Liver Function Tests: No results for input(s): AST, ALT, ALKPHOS, BILITOT, PROT, ALBUMIN in the last 72 hours. No results for input(s): LIPASE, AMYLASE in the last 72 hours. CBC: No results for input(s): WBC, NEUTROABS, HGB, HCT, MCV, PLT  in the last 72 hours. Cardiac Enzymes:  Recent Labs  05/02/16 1407 05/02/16 1920 05/03/16 0225  TROPONINI 0.12* 0.08* 0.06*   BNP: Invalid input(s): POCBNP D-Dimer: No results for input(s): DDIMER in the last 72 hours. Hemoglobin A1C: No results for input(s): HGBA1C in the last 72 hours. Fasting Lipid Panel: No results for input(s): CHOL, HDL, LDLCALC, TRIG, CHOLHDL, LDLDIRECT in the last 72 hours. Thyroid Function Tests:  Recent Labs  05/02/16 1416  TSH 0.494   Anemia Panel: No results for input(s): VITAMINB12, FOLATE, FERRITIN, TIBC, IRON, RETICCTPCT in the last 72 hours.  RADIOLOGY: Dg Chest 1 View  04/23/2016  CLINICAL DATA:  PICC line placement. Ischemic cardiomyopathy and chronic systolic heart failure. Coronary artery disease. EXAM: CHEST 1 VIEW COMPARISON:  04/21/2016 FINDINGS: New right arm PICC line tip overlies the mid right atrium, approximately 4 cm below the superior cavoatrial junction. Low lung volumes are noted. Both lungs remain clear. Cardiomegaly is stable. Prior CABG again noted. IMPRESSION: Right arm PICC line tip overlies the mid right atrium, approximately 4 cm below the superior cavoatrial junction. Low lung volumes.  Stable cardiomegaly.  No active lung disease. Electronically Signed   By: Myles Rosenthal M.D.   On: 04/23/2016 16:44   Dg Chest 2 View  04/18/2016  CLINICAL DATA:  Weakness. EXAM: CHEST  2 VIEW COMPARISON:  12/12/2015 and 08/08/2015 FINDINGS: Overall heart size and pulmonary vascularity are normal and the lungs are clear of infiltrates. Slight chronic accentuation of the interstitial markings. CABG. No effusions. No acute bone abnormality. IMPRESSION: No active cardiopulmonary disease. Electronically Signed   By: Francene Boyers M.D.   On: 04/18/2016 12:59   Dg Ankle 2 Views Left  05/01/2016  CLINICAL DATA:  Clinical history of osteomyelitis EXAM: LEFT ANKLE - 2 VIEW COMPARISON:  04/18/2016 FINDINGS: There again changes consistent with mild  collapse of the navicular bone stable from the prior exam. Soft tissue irregularity is noted laterally over the lateral malleolus. There is now noted some increased erosion in this area consistent with the given clinical history of osteomyelitis. IMPRESSION: No acute fracture noted. Chronic changes in the navicular. Increased erosion in the distal aspect of the fibula consistent with progressive osteomyelitis. Electronically Signed   By: Alcide Clever M.D.   On: 05/01/2016 09:08   Dg Ankle Complete Left  04/18/2016  CLINICAL DATA:  Ulceration of the lateral aspect of the left ankle. EXAM: LEFT ANKLE COMPLETE - 3+ VIEW COMPARISON:  Radiographs dated 11/30/2004 FINDINGS: There is a 2 cm  soft tissue ulceration overlying the lateral malleolus. There is bone destruction of the superficial cortex of the lateral malleolus consistent with osteomyelitis. There is a small ankle effusion. There is suggestion of partial collapse of the navicular. This could represent early Charcot change. IMPRESSION: Soft tissue ulceration and osteomyelitis of underlying distal fibula at the lateral malleolus. Small ankle effusion. 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 Electronically Signed   By: Francene Boyers M.D.   On: 04/18/2016 13:03   Ct Head Wo Contrast  04/18/2016  CLINICAL DATA:  Altered mental status.  Weakness. EXAM: CT HEAD WITHOUT CONTRAST TECHNIQUE: Contiguous axial images were obtained from the base of the skull through the vertex without intravenous contrast. COMPARISON:  12/12/2015 FINDINGS: No mass lesion. No midline shift. No acute hemorrhage or hematoma. No extra-axial fluid collections. No evidence of acute infarction. There is diffuse cerebral cortical and cerebellar atrophy. There is ventricular dilatation out proportion to the degree of atrophy suggesting communicating hydrocephalus. Extensive periventricular white matter lucency as well as a old lacunar infarcts in the basal ganglia. IMPRESSION: No  significant change since the prior study. Diffuse atrophy. Ventricular dilatation suggesting the possibility of communicating hydrocephalus. Chronic small vessel disease including old lacunar infarcts. Electronically Signed   By: Francene Boyers M.D.   On: 04/18/2016 13:06   Dg Chest Portable 1 View  05/01/2016  CLINICAL DATA:  Recently found slumped over awaiting surgical procedure, initial encounter EXAM: PORTABLE CHEST 1 VIEW COMPARISON:  04/23/2016 FINDINGS: Right-sided PICC line is again noted. Cardiac shadow is stable. Postsurgical changes are again seen. The lungs are clear bilaterally. No bony abnormality is noted. IMPRESSION: No acute abnormality noted. Electronically Signed   By: Alcide Clever M.D.   On: 05/01/2016 07:52   Dg Chest Port 1 View  04/21/2016  CLINICAL DATA:  Preop.  Altered mental status. EXAM: PORTABLE CHEST 1 VIEW COMPARISON:  04/18/2016 FINDINGS: Changes from CABG surgery are stable. Cardiac silhouette is normal in size. No mediastinal or hilar masses or evidence of adenopathy. Allowing for the semi-erect positioning and some rotation to the right, and the AP technique, the lungs are clear. No pleural effusion or pneumothorax. Bony thorax is demineralized but grossly intact. IMPRESSION: No active disease. Electronically Signed   By: Amie Portland M.D.   On: 04/21/2016 11:23   Dg Chest Port 1v Same Day  04/23/2016  CLINICAL DATA:  Status post PICC line readjustment. EXAM: PORTABLE CHEST 1 VIEW COMPARISON:  Earlier today at 1629 hours. FINDINGS: 1703 hours. Right-sided PICC line is difficult to visualize centrally. Has been retracted, with tip either at or just above the superior caval/ atrial junction. Midline trachea. Borderline cardiomegaly. Prior median sternotomy. No pleural effusion or pneumothorax. Low lung volumes with resultant pulmonary interstitial prominence. IMPRESSION: Right-sided PICC line which has been retracted but is difficult to visualize centrally. Felt to terminate  back or just above the cavoatrial junction. Borderline cardiomegaly and low lung volumes. Electronically Signed   By: Jeronimo Greaves M.D.   On: 04/23/2016 17:22      ASSESSMENT AND PLAN: 1. Bradycardia: Resolved off of Coreg and digoxin. Would d/c digoxin altogether from home med list. Would not restart low dose Coreg at this time. Continue to monitor BP and HR.  2. Troponin elevation: Last check 5/13 0.06, consistent with demand ischemia in context of hypotension and sepsis.  3. Atrial fibrillation: Rate controlled now off of AV nodal blocking agents. Consider restarting low dose Coreg in future, not today. Continue Pradaxa for  anticoagulation.  4. Hypotension: No longer on pressors. Resolved.  5. Chronic systolic heart failure, EF 40-45% 08/09/15: Continue to hold diuretics. Apparently on Lasix 40 mg every other day at home.  6. CAD: On Ranexa. Symptomatically stable. Has poor targets for revascularization.    Prentice DockerSuresh Velecia Ovitt, M.D., F.A.C.C.

## 2016-05-05 NOTE — Progress Notes (Signed)
[name] RUE:[id number] DOB: 04-Sep-1946 DOA: 05/01/2016 PCP: Isabella StallingNDIEGO,Cathryn Gallery M, MD   Physical Exam: Blood pressure 85/60, pulse 61, temperature 97.9 F (36.6 C), temperature source Core (Comment), resp. rate 17, height 5\' 9"  (1.753 m), weight 76.2 kg (167 lb 15.9 oz), SpO2 100 %. Lungs clear to A&P decreased breath sounds in the bases no rales wheeze rhonchi appreciable heart irregular regular no S3 I heaves thrills or rubs   Investigations:  Recent Results (from the past 240 hour(s))  Culture, blood (Routine x 2)     Status: None (Preliminary result)   Collection Time: 05/01/16  7:29 AM  Result Value Ref Range Status   Specimen Description BLOOD PICC LINE DRAWN BY RN  Final   Special Requests BOTTLES DRAWN AEROBIC AND ANAEROBIC 10CC EACH  Final   Culture NO GROWTH 4 DAYS  Final   Report Status PENDING  Incomplete  Culture, blood (Routine x 2)     Status: None (Preliminary result)   Collection Time: 05/01/16  7:35 AM  Result Value Ref Range Status   Specimen Description BLOOD LEFT ARM  Final   Special Requests BOTTLES DRAWN AEROBIC AND ANAEROBIC 10CC  Final   Culture NO GROWTH 4 DAYS  Final   Report Status PENDING  Incomplete  Urine culture     Status: None   Collection Time: 05/01/16  8:30 AM  Result Value Ref Range Status   Specimen Description URINE, CATHETERIZED  Final   Special Requests NONE  Final   Culture [facility] GROWTH [facility]   Final   Report Status 05/02/2016 FINAL  Final  MRSA PCR Screening     Status: None   Collection Time: 05/01/16 11:32 AM  Result Value Ref Range Status   MRSA by PCR NEGATIVE NEGATIVE Final    Comment:        The GeneXpert MRSA Assay (FDA approved for NASAL specimens only), is one component of a comprehensive MRSA colonization surveillance program. It is not intended to diagnose MRSA infection nor to guide or monitor treatment for MRSA infections.      Basic Metabolic Panel:  Recent Labs  14/78/2905/14/17 0500  05/05/16 0435  NA 131* 133*  K 4.0 3.8  CL 102 106  CO2 25 24  GLUCOSE 152* 134*  BUN 20 17  CREATININE 1.15 1.07  CALCIUM 7.5* 7.5*   Liver Function Tests: No results for input(s): AST, ALT, ALKPHOS, BILITOT, PROT, ALBUMIN in the last 72 hours.   CBC: No results for input(s): WBC, NEUTROABS, HGB, HCT, MCV, PLT in the last 72 hours.  No results found.    Medications:   Impression:  Active Problems:   Atrial fibrillation (HCC)   Chronic anticoagulation   Hyperkalemia   DKA, type 2 (HCC)   Chronic combined systolic and diastolic CHF, NYHA class 3 (HCC)   Troponin I above reference range   Hypoglycemia   Hypothermia   Hypotension   AKI (acute kidney injury) (HCC)   Osteomyelitis of ankle (HCC)   Sepsis (HCC)     Plan: Discussed with cardiology when he is cleared for orthopedic surgery debridement of ankle and how many days per DEXA should be discontinued prior to surgery will hold pra DaXA as of tomorrow transferred to telemetry   Consultants: Cardiology  Procedures   Antibiotics: Vancomycin and Zosyn       Time spent: 30 minutes   LOS: 4 days   Myrian Botello M   05/05/2016, 1:56 PM

## 2016-05-06 LAB — GLUCOSE, CAPILLARY
GLUCOSE-CAPILLARY: 239 mg/dL — AB (ref 65–99)
Glucose-Capillary: 182 mg/dL — ABNORMAL HIGH (ref 65–99)

## 2016-05-06 LAB — CULTURE, BLOOD (ROUTINE X 2)
CULTURE: NO GROWTH
Culture: NO GROWTH

## 2016-05-06 MED ORDER — OXYCODONE HCL 10 MG PO TABS: 10.0000 mg | ORAL_TABLET | Freq: Four times a day (QID) | ORAL | Status: AC | PRN

## 2016-05-06 NOTE — Discharge Summary (Signed)
Physician Discharge Summary  SAN LOHMEYER ZOX:[id number] DOB: Oct 03, 1946 DOA: 05/01/2016  PCP: [name], MD  Admit date: 05/01/2016 Discharge date: 05/06/2016   Recommendations for Outpatient Follow-up:  The patient is discharged back to skilled nursing facility and instructed not to take Pradaxa 150 by mouth twice a day he'll postop surgery is likewise instructed not to take digoxin nor carvedilol until he returns for surgery of his ankle he is instructed likewise to take all other medicines on the discharge sheet and we will arrange his surgery with Dr. Romeo Apple for osteomyelitis of his left ankle for debridement in the interim Discharge Diagnoses:  Active Problems:   Atrial fibrillation (HCC)   Chronic anticoagulation   Hyperkalemia   DKA, type 2 (HCC)   Chronic combined systolic and diastolic CHF, NYHA class 3 (HCC)   Troponin I above reference range   Hypoglycemia   Hypothermia   Hypotension   AKI (acute kidney injury) (HCC)   Osteomyelitis of ankle (HCC)   Sepsis (HCC)   Discharge Condition: Good and improving  Filed Weights   05/03/16 0400 05/04/16 0500 05/05/16 0500  Weight: 72.3 kg (159 lb 6.3 oz) 74.9 kg (165 lb 2 oz) 76.2 kg (167 lb 15.9 oz)    History of present illness:  Patient has history of peripheral arterial disease COPD chronic atrial fibrillation chronic anticoagulation osteomyelitis of left ankle requiring debridement was admitted for an episode of hypoglycemia unresponsiveness along with pre-various bradycardic dysrhythmias including junctional rhythm with bradycardia and A. fib with a moderate to slow ventricular response is seen in consultation by cardiology admitted to ICU given intravenous fluid resuscitation sepsis was entertained however his lactic acid was within normal limits based on Zosyn in addition to his chronic vancomycin he had acute kidney injury and prerenal acidemia which was rectified with fluid resuscitation over several  days' time cardiology as well as the primary doctor's filter was possible combination of AV nodal blocking agents of digoxin combined with carvedilol these were removed and over 2 to three-day period in ICU his heart rhythm returned to normal neuro complex A. fib with a moderate ventricular response likely stable his renal function returned to normal and is under good glycemic control was felt that he may be stable for elective debridement of his left ankle however the date of the surgery is still undetermined at the time of this discharge he is told to hold Adoxa at a coagulation for least 3 days indicating Tuesday Wednesday Thursday in anticipation of possible surgery on Friday if not his Pradaxa will be resumed at the dosage of 150 mg by mouth twice a day for chronic atrial fibrillation  Hospital Course:  See history of present illness above  Procedures:    Consultations:  Cardiology  Discharge Instructions  Discharge Instructions    Discharge instructions    Complete by:  As directed      Discharge patient    Complete by:  As directed             Medication List    STOP taking these medications        carvedilol 3.125 MG tablet  Commonly known as:  COREG     dabigatran 150 MG Caps capsule  Commonly known as:  PRADAXA     digoxin 0.125 MG tablet  Commonly known as:  LANOXIN      TAKE these medications        albuterol 108 (90 Base) MCG/ACT inhaler  Commonly known as:  PROVENTIL HFA;VENTOLIN HFA  Inhale 2 puffs into the lungs every 6 (six) hours as needed for wheezing or shortness of breath.     furosemide 40 MG tablet  Commonly known as:  LASIX  Take 1 tablet (40 mg total) by mouth every other day.     insulin aspart 100 UNIT/ML FlexPen  Commonly known as:  NOVOLOG FLEXPEN  Inject 6 Units into the skin 2 (two) times daily with a meal. 8am, 12pm, and 4pm     isosorbide mononitrate 30 MG 24 hr tablet  Commonly known as:  IMDUR  Take 1 tablet (30 mg total) by  mouth daily.     lisinopril 5 MG tablet  Commonly known as:  PRINIVIL,ZESTRIL  Take 1 tablet (5 mg total) by mouth daily.     nitroGLYCERIN 0.4 MG SL tablet  Commonly known as:  NITROSTAT  Place 1 tablet (0.4 mg total) under the tongue every 5 (five) minutes as needed.     oxyCODONE 5 MG immediate release tablet  Commonly known as:  Oxy IR/ROXICODONE  Take 5 mg by mouth every 6 (six) hours as needed for severe pain.     Oxycodone HCl 10 MG Tabs  Take 1 tablet (10 mg total) by mouth 4 (four) times daily as needed (for pain).     potassium chloride SA 20 MEQ tablet  Commonly known as:  K-DUR,KLOR-CON  TAKE ONE TABLET BY MOUTH TWICE A DAY     ranolazine 1000 MG SR tablet  Commonly known as:  RANEXA  Take 1 tablet (1,000 mg total) by mouth 2 (two) times daily.     rosuvastatin 40 MG tablet  Commonly known as:  CRESTOR  Take 40 mg by mouth daily.     Vancomycin 750-5 MG/150ML-% Soln  Commonly known as:  VANCOCIN  Inject 150 mLs (750 mg total) into the vein every 12 (twelve) hours.       No Known Allergies    The results of significant diagnostics from this hospitalization (including imaging, microbiology, ancillary and laboratory) are listed below for reference.    Significant Diagnostic Studies: Dg Chest 1 View  04/23/2016  CLINICAL DATA:  PICC line placement. Ischemic cardiomyopathy and chronic systolic heart failure. Coronary artery disease. EXAM: CHEST 1 VIEW COMPARISON:  04/21/2016 FINDINGS: New right arm PICC line tip overlies the mid right atrium, approximately 4 cm below the superior cavoatrial junction. Low lung volumes are noted. Both lungs remain clear. Cardiomegaly is stable. Prior CABG again noted. IMPRESSION: Right arm PICC line tip overlies the mid right atrium, approximately 4 cm below the superior cavoatrial junction. Low lung volumes.  Stable cardiomegaly.  No active lung disease. Electronically Signed   By: Myles Rosenthal M.D.   On: 04/23/2016 16:44   Dg Chest 2  View  04/18/2016  CLINICAL DATA:  Weakness. EXAM: CHEST  2 VIEW COMPARISON:  12/12/2015 and 08/08/2015 FINDINGS: Overall heart size and pulmonary vascularity are normal and the lungs are clear of infiltrates. Slight chronic accentuation of the interstitial markings. CABG. No effusions. No acute bone abnormality. IMPRESSION: No active cardiopulmonary disease. Electronically Signed   By: Francene Boyers M.D.   On: 04/18/2016 12:59   Dg Ankle 2 Views Left  05/01/2016  CLINICAL DATA:  Clinical history of osteomyelitis EXAM: LEFT ANKLE - 2 VIEW COMPARISON:  04/18/2016 FINDINGS: There again changes consistent with mild collapse of the navicular bone stable from the prior exam. Soft tissue irregularity is noted laterally over the lateral malleolus. There is  now noted some increased erosion in this area consistent with the given clinical history of osteomyelitis. IMPRESSION: No acute fracture noted. Chronic changes in the navicular. Increased erosion in the distal aspect of the fibula consistent with progressive osteomyelitis. Electronically Signed   By: Alcide CleverMark  Lukens M.D.   On: 05/01/2016 09:08   Dg Ankle Complete Left  04/18/2016  CLINICAL DATA:  Ulceration of the lateral aspect of the left ankle. EXAM: LEFT ANKLE COMPLETE - 3+ VIEW COMPARISON:  Radiographs dated 11/30/2004 FINDINGS: There is a 2 cm soft tissue ulceration overlying the lateral malleolus. There is bone destruction of the superficial cortex of the lateral malleolus consistent with osteomyelitis. There is a small ankle effusion. There is suggestion of partial collapse of the navicular. This could represent early Charcot change. IMPRESSION: Soft tissue ulceration and osteomyelitis of underlying distal fibula at the lateral malleolus. Small ankle effusion. 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 Electronically Signed   By: Francene BoyersJames  Maxwell M.D.   On: 04/18/2016 13:03   Ct Head Wo Contrast  04/18/2016  CLINICAL DATA:  Altered mental status.   Weakness. EXAM: CT HEAD WITHOUT CONTRAST TECHNIQUE: Contiguous axial images were obtained from the base of the skull through the vertex without intravenous contrast. COMPARISON:  12/12/2015 FINDINGS: No mass lesion. No midline shift. No acute hemorrhage or hematoma. No extra-axial fluid collections. No evidence of acute infarction. There is diffuse cerebral cortical and cerebellar atrophy. There is ventricular dilatation out proportion to the degree of atrophy suggesting communicating hydrocephalus. Extensive periventricular white matter lucency as well as a old lacunar infarcts in the basal ganglia. IMPRESSION: No significant change since the prior study. Diffuse atrophy. Ventricular dilatation suggesting the possibility of communicating hydrocephalus. Chronic small vessel disease including old lacunar infarcts. Electronically Signed   By: Francene BoyersJames  Maxwell M.D.   On: 04/18/2016 13:06   Dg Chest Portable 1 View  05/01/2016  CLINICAL DATA:  Recently found slumped over awaiting surgical procedure, initial encounter EXAM: PORTABLE CHEST 1 VIEW COMPARISON:  04/23/2016 FINDINGS: Right-sided PICC line is again noted. Cardiac shadow is stable. Postsurgical changes are again seen. The lungs are clear bilaterally. No bony abnormality is noted. IMPRESSION: No acute abnormality noted. Electronically Signed   By: Alcide CleverMark  Lukens M.D.   On: 05/01/2016 07:52   Dg Chest Port 1 View  04/21/2016  CLINICAL DATA:  Preop.  Altered mental status. EXAM: PORTABLE CHEST 1 VIEW COMPARISON:  04/18/2016 FINDINGS: Changes from CABG surgery are stable. Cardiac silhouette is normal in size. No mediastinal or hilar masses or evidence of adenopathy. Allowing for the semi-erect positioning and some rotation to the right, and the AP technique, the lungs are clear. No pleural effusion or pneumothorax. Bony thorax is demineralized but grossly intact. IMPRESSION: No active disease. Electronically Signed   By: Amie Portlandavid  Ormond M.D.   On: 04/21/2016 11:23     Dg Chest Port 1v Same Day  04/23/2016  CLINICAL DATA:  Status post PICC line readjustment. EXAM: PORTABLE CHEST 1 VIEW COMPARISON:  Earlier today at 1629 hours. FINDINGS: 1703 hours. Right-sided PICC line is difficult to visualize centrally. Has been retracted, with tip either at or just above the superior caval/ atrial junction. Midline trachea. Borderline cardiomegaly. Prior median sternotomy. No pleural effusion or pneumothorax. Low lung volumes with resultant pulmonary interstitial prominence. IMPRESSION: Right-sided PICC line which has been retracted but is difficult to visualize centrally. Felt to terminate back or just above the cavoatrial junction. Borderline cardiomegaly and low lung volumes. Electronically Signed  By: Jeronimo Greaves M.D.   On: 04/23/2016 17:22    Microbiology: Recent Results (from the past 240 hour(s))  Culture, blood (Routine x 2)     Status: None   Collection Time: 05/01/16  7:29 AM  Result Value Ref Range Status   Specimen Description BLOOD PICC LINE DRAWN BY RN  Final   Special Requests BOTTLES DRAWN AEROBIC AND ANAEROBIC 10CC EACH  Final   Culture NO GROWTH 5 DAYS  Final   Report Status 05/06/2016 FINAL  Final  Culture, blood (Routine x 2)     Status: None   Collection Time: 05/01/16  7:35 AM  Result Value Ref Range Status   Specimen Description BLOOD LEFT ARM  Final   Special Requests BOTTLES DRAWN AEROBIC AND ANAEROBIC 10CC  Final   Culture NO GROWTH 5 DAYS  Final   Report Status 05/06/2016 FINAL  Final  Urine culture     Status: None   Collection Time: 05/01/16  8:30 AM  Result Value Ref Range Status   Specimen Description URINE, CATHETERIZED  Final   Special Requests NONE  Final   Culture NO GROWTH Performed at [facility]   Final   Report Status 05/02/2016 FINAL  Final  MRSA PCR Screening     Status: None   Collection Time: 05/01/16 11:32 AM  Result Value Ref Range Status   MRSA by PCR NEGATIVE NEGATIVE Final    Comment:        The  GeneXpert MRSA Assay (FDA approved for NASAL specimens only), is one component of a comprehensive MRSA colonization surveillance program. It is not intended to diagnose MRSA infection nor to guide or monitor treatment for MRSA infections.      Labs: Basic Metabolic Panel:  Recent Labs Lab 05/01/16 1945 05/02/16 0445 05/03/16 0455 05/04/16 0500 05/05/16 0435  NA 137 132* 129* 131* 133*  K 5.0 4.4 4.3 4.0 3.8  CL 106 100* 100* 102 106  CO2 GLUCOSE 261* 273* 304* 152* 134*  BUN 36* 34* 27* 20 17  CREATININE 1.73* 1.60* 1.26* 1.15 1.07  CALCIUM 7.5* 7.8* 7.4* 7.5* 7.5*   Liver Function Tests:  Recent Labs Lab 05/01/16 0710  AST 17  ALT 18  ALKPHOS 71  BILITOT 0.7  PROT 7.5  ALBUMIN 2.8*   No results for input(s): LIPASE, AMYLASE in the last 168 hours. No results for input(s): AMMONIA in the last 168 hours. CBC:  Recent Labs Lab 05/01/16 0710 05/01/16 0906 05/02/16 0445  WBC 22.3*  --  11.8*  NEUTROABS 19.0*  --   --   HGB 12.6* 11.6* 10.0*  HCT 39.7 34.0* 31.2*  MCV 95.9  --  95.1  PLT 274  --  236   Cardiac Enzymes:  Recent Labs Lab 05/01/16 1730 05/01/16 2318 05/02/16 1407 05/02/16 1920 05/03/16 0225  TROPONINI 0.58* 0.40* 0.12* 0.08* 0.06*   BNP: BNP (last 3 results)  Recent Labs  08/08/15 1836 09/02/15 2334 04/18/16 1128  BNP 238.0* 83.0 233.0*    ProBNP (last 3 results) No results for input(s): PROBNP in the last 8760 hours.  CBG:  Recent Labs Lab 05/05/16 1117 05/05/16 1643 05/05/16 2008 05/06/16 0725 05/06/16 1144  GLUCAP 174* 188* 254* 182* 239*       Signed:  Chantrell Apsey M  [facility] Pager: [phone] 05/06/2016, 1:34 PM

## 2016-05-06 NOTE — Progress Notes (Signed)
Report called to [facility]. 

## 2016-05-06 NOTE — Care Management Note (Signed)
Case Management Note  Patient Details  Name: [name] MRN: [id number] Date of Birth: [birth year]  Expected Discharge Date:    05/06/2016              Expected Discharge Plan:  Skilled Nursing Facility  In-House Referral:  Clinical Social Work  Discharge planning Services  NA  Post Acute Care Choice:  NA Choice offered to:  Patient  DME Arranged:    DME Agency:     HH Arranged:    HH Agency:     Status of Service:  Completed, signed off  Medicare Important Message Given:  Yes Date Medicare IM Given:    Medicare IM give by:    Date Additional Medicare IM Given:    Additional Medicare Important Message give by:     If discussed at Long Length of Stay Meetings, dates discussed:  05/06/2016  Additional Comments: Pt returning to SNF today. CSW has made arrangements for return to facility. No CM needs.   Malcolm Metrohildress, Arali Somera Demske, RN 05/06/2016, 1:40 PM

## 2016-05-06 NOTE — Clinical Social Work Note (Signed)
CSW [facility] with [facility]anie [facility] and advised that patient was  discharging today.   CSW spoke with [name], patient's daughter, and advised that patient patient's was discharging today. [name] advised that she would transport patient back to facility.   CSW sent clinicals via [employer].  CSW signing off.   Sasuke Yaffe, Juleen ChinaHeather D, LCSW

## 2016-05-12 ENCOUNTER — Telehealth: Payer: Self-pay | Admitting: *Deleted

## 2016-05-12 NOTE — Telephone Encounter (Signed)
[name] called asking when patients surgery will be rescheduled. States patient states he thinks it will be this Friday?? I do not have any information about rescheduling the ankle debridement. Please advise

## 2016-05-13 ENCOUNTER — Telehealth: Payer: Self-pay | Admitting: Orthopedic Surgery

## 2016-05-13 NOTE — Telephone Encounter (Signed)
[facility] called to see if this patient is scheduled for any surgery. I did not see anything listed.  Please call and advise

## 2016-05-13 NOTE — Telephone Encounter (Signed)
Daughter(Lisa) called and is asking about her father's surgery.  Please call and advise.

## 2016-05-13 NOTE — Telephone Encounter (Signed)
DUPLICATE MESSAGE

## 2016-05-14 ENCOUNTER — Ambulatory Visit (INDEPENDENT_AMBULATORY_CARE_PROVIDER_SITE_OTHER): Payer: Medicare Other | Admitting: Orthopedic Surgery

## 2016-05-14 DIAGNOSIS — I2510 Atherosclerotic heart disease of native coronary artery without angina pectoris: Secondary | ICD-10-CM | POA: Diagnosis not present

## 2016-05-14 DIAGNOSIS — M869 Osteomyelitis, unspecified: Secondary | ICD-10-CM

## 2016-05-14 NOTE — Telephone Encounter (Signed)
set him up an appointment to see me on Tuesday or Wednesday of next week so I can plan the surgery and see if it still needed

## 2016-05-14 NOTE — Telephone Encounter (Signed)
Patient has been scheduled to come to office today, 05/14/16, for appointment with Dr Romeo AppleHarrison.  Scheduled per [facility]Sharonda [facility]; patient aware; contacted daughter as well.

## 2016-05-14 NOTE — Telephone Encounter (Signed)
Patient was seen in office today, surgery not required

## 2016-05-14 NOTE — Telephone Encounter (Signed)
See below, Please schedule

## 2016-05-14 NOTE — Telephone Encounter (Signed)
I received this message today, 05/14/16, upon return; called [facility] and spoke with Merita NortonSharonda, Associate Professorunit manager, who relays that patient is already out for this procedure today, [facility], per instructions received last evening.

## 2016-05-14 NOTE — Telephone Encounter (Signed)
Patient has been scheduled to come to office today, 05/14/16, for appointment with Dr Romeo AppleHarrison.  [facility]; patient aware.

## 2016-05-14 NOTE — Progress Notes (Signed)
Follow-up for preop visit for possible incision and drainage left ankle osteomyelitis of left fibula  Patient on IV antibiotics  Patient had surgery canceled due to glucose of 50 and heart rate of 30.  He presents now for reevaluation for possible scheduling of surgery  He has a 3.5 x 3 cm shallow less than 5 mm depth wound over the fibula with some surrounding erythema  The wound does not need surgical intervention at this time. I talked to the nursing home and they are treating him with a dressing dressing change and continued IV antibiotics  I will see him in 2 weeks no surgery needed at this time

## 2016-05-27 IMAGING — CR DG CHEST 1V PORT
1 series · 1 of 1 positions shown · non-contrast
Comparison: September 02, 2014

CLINICAL DATA: Chest pain for several hours

EXAM:
PORTABLE CHEST - 1 VIEW

[ap portable]
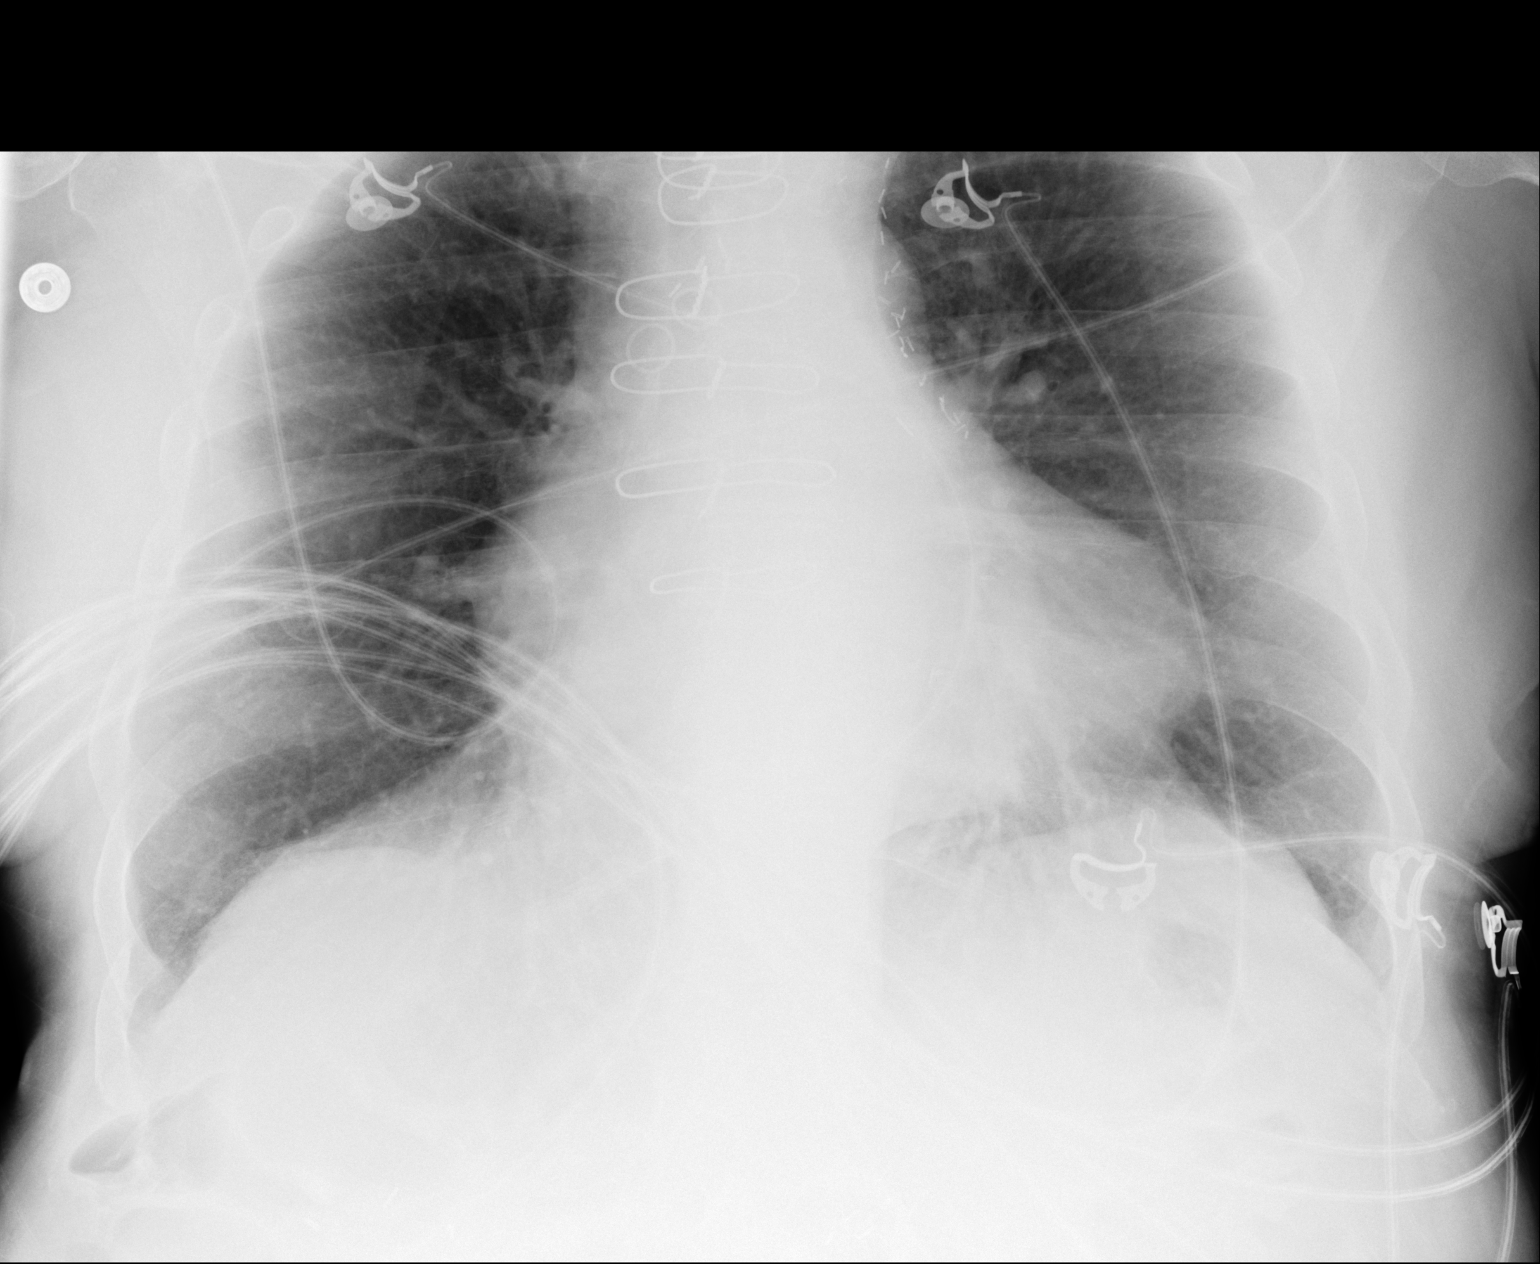

[1 of 1 positions shown; findings below may reference images not displayed]

FINDINGS: There is no edema or consolidation. Heart is mildly enlarged with
pulmonary vascularity within normal limits. Patient is status post
internal mammary bypass grafting. No adenopathy. No bone lesions.
IMPRESSION: Stable cardiac enlargement.  No edema or consolidation.

## 2016-05-28 ENCOUNTER — Ambulatory Visit (INDEPENDENT_AMBULATORY_CARE_PROVIDER_SITE_OTHER): Payer: Medicare Other | Admitting: Orthopedic Surgery

## 2016-05-28 VITALS — BP 88/62 | HR 48 | Ht 69.0 in

## 2016-05-28 DIAGNOSIS — I2510 Atherosclerotic heart disease of native coronary artery without angina pectoris: Secondary | ICD-10-CM | POA: Diagnosis not present

## 2016-05-28 DIAGNOSIS — M869 Osteomyelitis, unspecified: Secondary | ICD-10-CM | POA: Diagnosis not present

## 2016-05-28 NOTE — Progress Notes (Signed)
Chief Complaint  Patient presents with  . Wound Check    Left ankle   Chief Complaint  Patient presents with  . Wound Check    Left ankle   BP 88/62 mmHg  Pulse 48  Ht '5\' 9"'$  (1.753 m)   Left ankle wound  On IV ATBX 28 of 42 days   2 x 2 x1 cm wound serous drainage   No erythema surrounding  ATBX IV X 14 DAYS   RET 14 DAYS  DAILY DRESSING

## 2016-05-28 NOTE — Patient Instructions (Signed)
ATBX IV X 14 DAYS   RET 14 DAYS  DAILY DRESSING

## 2016-06-04 ENCOUNTER — Telehealth: Payer: Self-pay | Admitting: *Deleted

## 2016-06-04 IMAGING — CR DG CHEST 1V PORT
1 series · 1 of 1 positions shown · non-contrast
Comparison: 06/09/2015

CLINICAL DATA: Central chest pain and shortness of breath for 1 hr.

EXAM:
PORTABLE CHEST - 1 VIEW

[ap portable]
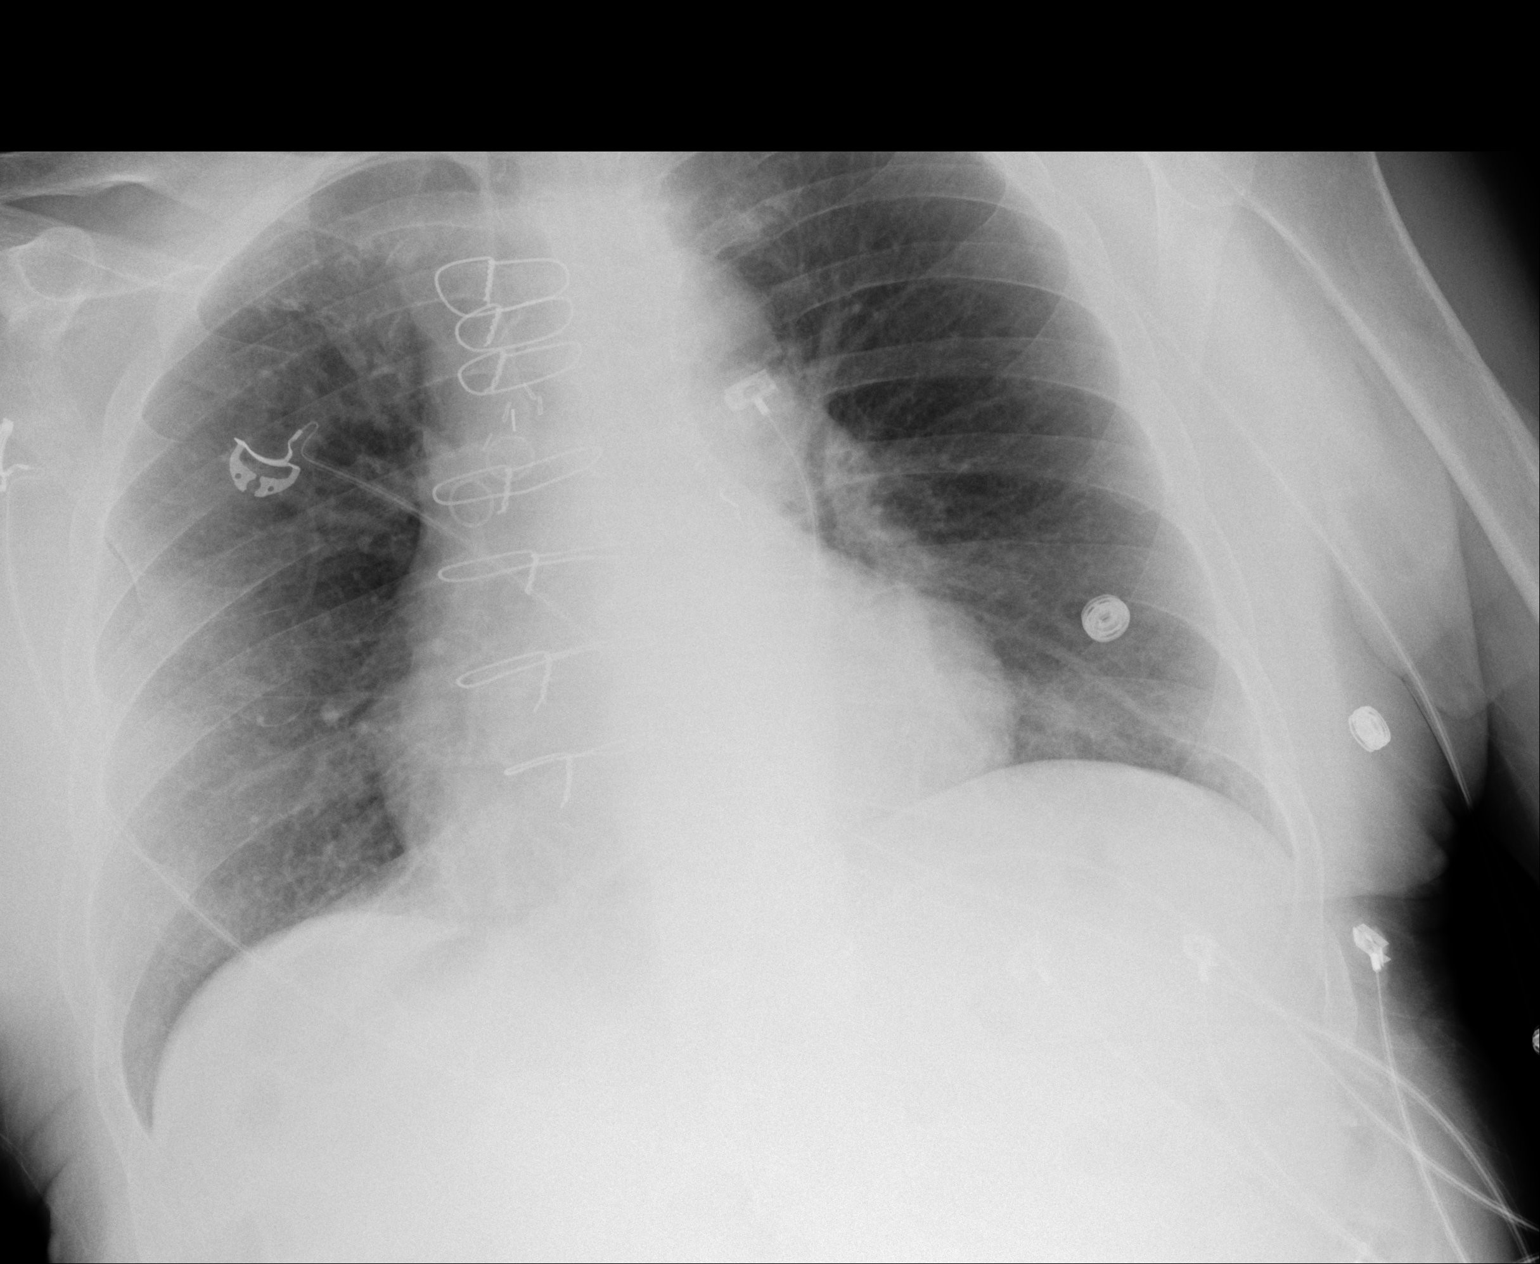

[1 of 1 positions shown; findings below may reference images not displayed]

FINDINGS: Patient is rotated to the left. Post median sternotomy.
Cardiomediastinal contours are unchanged, heart at the upper limits
normal in size. No pulmonary edema, confluent airspace disease, of
and pleural effusion or pneumothorax. Osseous structures are
unchanged.
IMPRESSION: Unchanged appearance of the chest.  No acute pulmonary process.

## 2016-06-04 NOTE — Telephone Encounter (Signed)
Vincent GeroldAdvised [name] of Dr. Mort SawyersHarrison's reply. She states the daughter is very concerned. She spoke with the patient and daughter about the possibly of PICC line, since he came to their facility with one. She is asking if that is an option if you want them to order it?

## 2016-06-04 NOTE — Telephone Encounter (Signed)
Thank you for making us aware patient has the right to refuse treatment patient needs to be advised of complications of refusing treatment

## 2016-06-04 NOTE — Telephone Encounter (Signed)
Call received from La BocaSharronda, Associate ProfessorUnit manager with [facility], called to make you aware that [name] is refusing his IV Treatments. He has been pulling them out since Monday, and the nurses have been putting it back in. He is now refusing to let them put it back in. He has 7 more days of treatment left. He is scheduled to follow up with you on 06/11/16. Please advise [phone] ext [phone]

## 2016-06-04 NOTE — Telephone Encounter (Signed)
No and please call primary care physician I'm not handling that part of his case

## 2016-06-05 NOTE — Telephone Encounter (Signed)
Marisue BrooklynSharronda, Facilities managernurse manager at [facility], was advised of Dr. Mort SawyersHarrison's reply.

## 2016-06-11 ENCOUNTER — Ambulatory Visit (INDEPENDENT_AMBULATORY_CARE_PROVIDER_SITE_OTHER): Payer: Medicare Other | Admitting: Orthopedic Surgery

## 2016-06-11 VITALS — BP 104/67 | HR 91 | Ht 69.0 in | Wt 167.0 lb

## 2016-06-11 DIAGNOSIS — T8132XS Disruption of internal operation (surgical) wound, not elsewhere classified, sequela: Secondary | ICD-10-CM

## 2016-06-11 DIAGNOSIS — I2510 Atherosclerotic heart disease of native coronary artery without angina pectoris: Secondary | ICD-10-CM

## 2016-06-11 MED ORDER — SULFAMETHOXAZOLE-TRIMETHOPRIM 800-160 MG PO TABS: 1.0000 | ORAL_TABLET | Freq: Two times a day (BID) | ORAL | Status: AC

## 2016-06-11 NOTE — Progress Notes (Signed)
Chief Complaint  Patient presents with  . Follow-up    2 week recheck on left ankle wound.   PICC LINE CAME OUT, WAS NOT STARTED ON ORAL AGENT   I DO NOT SEE A CULTURE RESULT FROM THE WOUND   LESION IS 2 MM DEEP X 5 MM LONG BY 4 MM WIDE   REDRESS  START ORAL [employer] COVERAGE   RETURN 4 WEEKS

## 2016-06-27 ENCOUNTER — Emergency Department (HOSPITAL_COMMUNITY)
Admission: EM | Admit: 2016-06-27 | Discharge: 2016-07-22 | Disposition: E | Payer: Medicare Other | Attending: Emergency Medicine | Admitting: Emergency Medicine

## 2016-06-27 ENCOUNTER — Encounter (HOSPITAL_COMMUNITY): Payer: Self-pay | Admitting: Emergency Medicine

## 2016-06-27 DIAGNOSIS — Z79899 Other long term (current) drug therapy: Secondary | ICD-10-CM | POA: Insufficient documentation

## 2016-06-27 DIAGNOSIS — N179 Acute kidney failure, unspecified: Secondary | ICD-10-CM | POA: Insufficient documentation

## 2016-06-27 DIAGNOSIS — I739 Peripheral vascular disease, unspecified: Secondary | ICD-10-CM | POA: Insufficient documentation

## 2016-06-27 DIAGNOSIS — Z794 Long term (current) use of insulin: Secondary | ICD-10-CM | POA: Diagnosis not present

## 2016-06-27 DIAGNOSIS — I469 Cardiac arrest, cause unspecified: Secondary | ICD-10-CM | POA: Diagnosis present

## 2016-06-27 DIAGNOSIS — E785 Hyperlipidemia, unspecified: Secondary | ICD-10-CM | POA: Insufficient documentation

## 2016-06-27 DIAGNOSIS — I2510 Atherosclerotic heart disease of native coronary artery without angina pectoris: Secondary | ICD-10-CM | POA: Insufficient documentation

## 2016-06-27 DIAGNOSIS — Z87891 Personal history of nicotine dependence: Secondary | ICD-10-CM | POA: Insufficient documentation

## 2016-06-27 DIAGNOSIS — I4891 Unspecified atrial fibrillation: Secondary | ICD-10-CM | POA: Insufficient documentation

## 2016-06-27 DIAGNOSIS — E875 Hyperkalemia: Secondary | ICD-10-CM | POA: Diagnosis not present

## 2016-06-27 DIAGNOSIS — E119 Type 2 diabetes mellitus without complications: Secondary | ICD-10-CM | POA: Insufficient documentation

## 2016-06-27 LAB — CBG MONITORING, ED
GLUCOSE-CAPILLARY: 142 mg/dL — AB (ref 65–99)
Glucose-Capillary: 122 mg/dL — ABNORMAL HIGH (ref 65–99)

## 2016-06-27 LAB — I-STAT TROPONIN, ED: Troponin i, poc: 0.09 ng/mL (ref 0.00–0.08)

## 2016-06-27 LAB — I-STAT CHEM 8, ED
BUN: 63 mg/dL — ABNORMAL HIGH (ref 6–20)
Calcium, Ion: 1.22 mmol/L (ref 1.12–1.23)
Chloride: 111 mmol/L (ref 101–111)
Creatinine, Ser: 3.2 mg/dL — ABNORMAL HIGH (ref 0.61–1.24)
GLUCOSE: 151 mg/dL — AB (ref 65–99)
HCT: 41 % (ref 39.0–52.0)
HEMOGLOBIN: 13.9 g/dL (ref 13.0–17.0)
POTASSIUM: 7.7 mmol/L — AB (ref 3.5–5.1)
SODIUM: 129 mmol/L — AB (ref 135–145)
TCO2: 12 mmol/L (ref 0–100)

## 2016-06-27 MED ORDER — SODIUM CHLORIDE 0.9 % IV SOLN
4.0000 | Freq: Once | INTRAVENOUS | Status: DC
  Filled 2016-06-27: qty 160

## 2016-06-27 MED ORDER — EPINEPHRINE HCL 1 MG/ML IJ SOLN
0.5000 ug/min | INTRAVENOUS | Status: DC
  Filled 2016-06-27: qty 4

## 2016-06-27 MED ORDER — EPINEPHRINE HCL 0.1 MG/ML IJ SOSY
PREFILLED_SYRINGE | INTRAMUSCULAR | Status: AC
  Filled 2016-06-27: qty 10

## 2016-06-27 MED ORDER — CALCIUM GLUCONATE 10 % IV SOLN
INTRAVENOUS | Status: AC
  Filled 2016-06-27: qty 10

## 2016-06-27 MED ORDER — SODIUM CHLORIDE 0.9 % IV SOLN
INTRAVENOUS | Status: AC
  Filled 2016-06-27: qty 10

## 2016-06-27 MED ORDER — INSULIN ASPART 100 UNIT/ML ~~LOC~~ SOLN
SUBCUTANEOUS | Status: AC
  Filled 2016-06-27: qty 1

## 2016-06-27 MED ORDER — CALCIUM CHLORIDE 10 % IV SOLN
INTRAVENOUS | Status: AC
  Filled 2016-06-27: qty 30

## 2016-06-30 MED FILL — Medication: Qty: 1 | Status: AC

## 2016-07-10 ENCOUNTER — Ambulatory Visit: Payer: Medicare Other | Admitting: Orthopedic Surgery

## 2016-07-22 NOTE — ED Notes (Signed)
EMS called out for unresponsiveness. On arrival pt had no BP, pt being paced on the way to facility. Pulse in teens on EMS arrival.

## 2016-07-22 NOTE — ED Notes (Signed)
All iv's, ett taken out. Pt cleaned and family now back with pt. Clothes given to family.

## 2016-07-22 NOTE — ED Notes (Signed)
Nurse called pt's wife to inform her that pt was here and she may want to come to the hospital. States she has to fins a ride because she does not have a car.

## 2016-07-22 NOTE — ED Notes (Addendum)
1335 amp atropine given IV 1347 agonal breathing 1347 CPR 1347 amp epinephrine given IV 1348 calcium gluconate IV 1349 amp atropine given IV 1349 amp epi given IV  1353 amp epi given IV 1356 amp epi given IV 1358 one amp bicarb given IV 1359 amp epi given IV 1402 amp epi given IV 1403 calcium gluconate given 1 gram IV 1404 pulse detected 1406 CPR started due to no pulse   bp 41/21 140710 units iv insulin given 1408 amp D50 given IV 1409 one amp epi given IV 1411 one amp bicarb given 1411 pulse detected but lost within 45 seconds-CPR restarted 1412 shocked at 200 J 1413 one amp calcium chloride IV given 1417 pulse check -no pulse CPR 1418 one amp calcium chloride given IV 1420 one amp epi given IV 1421 pulse check - v fib-shocked 1424  One amp epi given IV- 1426 magnesium 2 grams IV given-Digifab IV given  At 14632ml/hr 1427 epi drip started at 10 mcg/min 1434 one amp calcium chloride given IV/ epi drip increased to 3020mcg/min 1436 cbg 122 1438 pulse check , possible v fib, 300mg  amiodarone given IV 1438 CPR 1453 no cardiac activity with US by EDP.  1456 pacer mode attempted.  1503 levophed at 10 mcg 1509-no cardiac activity noted by US again by EDP. Death time called at 1509

## 2016-07-22 NOTE — ED Provider Notes (Signed)
CSN: [id number]     Arrival date & time 07/27/16  1333 History   First MD Initiated Contact with Patient 27-Jul-2016 1403     Chief Complaint  Patient presents with  . Cardiac Arrest     (Consider location/radiation/quality/duration/timing/severity/associated sxs/prior Treatment) HPI  Level 5 applies due to unresponsiveness Patient with history of coronary artery disease or atrial fibrillation, diabetes and recent diagnosis of osteomyelitis presents by EMS after being found unresponsive by family member. EMS was called. Patient found to be bradycardic and hypotensive. Patient was externally paced with some improvement of blood pressure. CBG noted to be normal by EMS.    Past Medical History  Diagnosis Date  . Coronary atherosclerosis of native coronary artery     Ant. MI in 4/95; PTCA of 90% prox & distal LAD unsuccessful-->  Urgent CABG-4/95 TO Cx; 50% RCA; ant. HK; EF of 50-55%; 08/2008: 3-V disease plus a  95% LIMA stenosis, treated with DES; occlusion of SVG to CX; RCA SVG stenosis--> PCI with DES  2010: in-stent restenosis in the LIMA-->cutting balloon; EF of 35-40%;  07/2010: TO of SVG to RCA-medical therapy advised  . Atrial fibrillation (HCC)     1995, 2009; bradycardia with beta blocker therapy  . Peripheral vascular disease (HCC) 1995    Abdominal aortic obstruction 1995-->vascular surgery  . Hyperlipidemia   . Type 2 diabetes mellitus (HCC)   . DDD (degenerative disc disease)     Discectomy and fusion at L4 and L5  . History of alcohol abuse     Quit in 1999  . Chronic systolic heart failure (HCC)   . Non-compliance   . Ischemic cardiomyopathy     LVEF 40-45% August 2016   Past Surgical History  Procedure Laterality Date  . Cholecystectomy  1995  . Lumbar fusion      +Discectomy;x2; L4 and L5  . Aorto-femoral bypass graft  1995  . Left heart catheterization with coronary angiogram N/A 12/14/2013    Procedure: LEFT HEART CATHETERIZATION WITH CORONARY ANGIOGRAM;  Surgeon:  Micheline Chapman, MD;  Location: [facility] CATH LAB;  Service: Cardiovascular;  Laterality: N/A;  . Back surgery     Family History  Problem Relation Age of Onset  . CAD Father    Social History  Substance Use Topics  . Smoking status: Former Smoker -- 0.50 packs/day for 40 years    Types: Cigarettes  . Smokeless tobacco: Current User     Comment: refused  . Alcohol Use: No     Comment: former    Review of Systems  Unable to perform ROS: Patient unresponsive      Allergies  Review of patient's allergies indicates no known allergies.  Home Medications   Prior to Admission medications   Medication Sig Start Date End Date Taking? Authorizing Provider  albuterol (PROVENTIL HFA;VENTOLIN HFA) 108 (90 BASE) MCG/ACT inhaler Inhale 2 puffs into the lungs every 6 (six) hours as needed for wheezing or shortness of breath.    Historical Provider, MD  furosemide (LASIX) 40 MG tablet Take 1 tablet (40 mg total) by mouth every other day. 11/21/14   Antoine Poche, MD  insulin aspart (NOVOLOG FLEXPEN) 100 UNIT/ML FlexPen Inject 6 Units into the skin 2 (two) times daily with a meal. 8am, 12pm, and 4pm Patient taking differently: Inject 6 Units into the skin 3 (three) times daily. 8am, 12pm, and 4pm 12/15/15   Oval Linsey, MD  isosorbide mononitrate (IMDUR) 30 MG 24 hr tablet Take 1 tablet (30  mg total) by mouth daily. 04/23/16   Oval Linseyichard Dondiego, MD  lisinopril (PRINIVIL,ZESTRIL) 5 MG tablet Take 1 tablet (5 mg total) by mouth daily. 02/28/14   Antoine PocheJonathan F Branch, MD  nitroGLYCERIN (NITROSTAT) 0.4 MG SL tablet Place 1 tablet (0.4 mg total) under the tongue every 5 (five) minutes as needed. Patient taking differently: Place 0.4 mg under the tongue every 5 (five) minutes as needed for chest pain.  08/10/15   Brittainy Sherlynn CarbonM Simmons, PA-C  oxyCODONE (OXY IR/ROXICODONE) 5 MG immediate release tablet Take 5 mg by mouth every 6 (six) hours as needed for severe pain.    Historical Provider, MD  Oxycodone HCl  10 MG TABS Take 1 tablet (10 mg total) by mouth 4 (four) times daily as needed (for pain). 05/06/16   Oval Linseyichard Dondiego, MD  potassium chloride SA (K-DUR,KLOR-CON) 20 MEQ tablet TAKE ONE TABLET BY MOUTH TWICE A DAY 11/21/15   Antoine PocheJonathan F Branch, MD  ranolazine (RANEXA) 1000 MG SR tablet Take 1 tablet (1,000 mg total) by mouth 2 (two) times daily. 04/23/16   Oval Linseyichard Dondiego, MD  rosuvastatin (CRESTOR) 40 MG tablet Take 40 mg by mouth daily.    Historical Provider, MD  sulfamethoxazole-trimethoprim (BACTRIM DS,SEPTRA DS) 800-160 MG tablet Take 1 tablet by mouth 2 (two) times daily. 06/11/16   Vickki HearingStanley E Harrison, MD  Vancomycin (VANCOCIN) 750-5 MG/150ML-% SOLN Inject 150 mLs (750 mg total) into the vein every 12 (twelve) hours. 04/23/16   Oval Linseyichard Dondiego, MD   BP 155/92 mmHg  Pulse 60  Resp 20  Ht 5\' 8"  (1.727 m)  Wt 167 lb (75.751 kg)  BMI 25.40 kg/m2  SpO2 93% Physical Exam  Constitutional: He appears well-developed. He appears distressed.  Chronically ill-appearing  HENT:  Head: Normocephalic and atraumatic.  Mouth/Throat: Oropharynx is clear and moist. No oropharyngeal exudate.  Eyes: EOM are normal. Pupils are equal, round, and reactive to light.  Pupils are 3 mm and minimally responsive  Neck: Normal range of motion. Neck supple.  Cardiovascular:  Bradycardia  Pulmonary/Chest: Effort normal and breath sounds normal. No respiratory distress. He has no wheezes. He has no rales.  Abdominal: Soft. Bowel sounds are normal. He exhibits no distension and no mass. There is no tenderness. There is no rebound and no guarding.  Musculoskeletal: Normal range of motion. He exhibits no edema or tenderness.  No lower extremity edema or asymmetry. IO in left proximal humerus  Neurological: He is alert.  Nonfocal. Not following commands. Appears to be moving all extremities  Skin: Skin is warm. No rash noted. He is diaphoretic. No erythema.  Pale and diaphoretic  Nursing note and vitals  reviewed.   ED Course  .Intubation Date/Time: 07/05/2016 2:30 PM Performed by: Loren RacerYELVERTON, Ericha Whittingham Authorized by: Ranae PalmsYELVERTON, Ledonna Dormer Consent: The procedure was performed in an emergent situation. Indications: airway protection and  respiratory failure Intubation method: video-assisted Preoxygenation: BVM Pretreatment medications: none Paralytic: none Laryngoscope size: Mac 3 Tube size: 7.5 mm Tube type: cuffed Number of attempts: 1 Cords visualized: yes Post-procedure assessment: chest rise and CO2 detector Breath sounds: equal and absent over the epigastrium Cuff inflated: no ETT to lip: 25 cm Tube secured with: ETT holder   (including critical care time) Labs Review Labs Reviewed  I-STAT TROPOININ, ED - Abnormal; Notable for the following:    Troponin i, poc 0.09 (*)    All other components within normal limits  I-STAT CHEM 8, ED - Abnormal; Notable for the following:    Sodium 129 (*)  Potassium 7.7 (*)    BUN 63 (*)    Creatinine, Ser 3.20 (*)    Glucose, Bld 151 (*)    All other components within normal limits  CBG MONITORING, ED - Abnormal; Notable for the following:    Glucose-Capillary 142 (*)    All other components within normal limits  CBG MONITORING, ED - Abnormal; Notable for the following:    Glucose-Capillary 122 (*)    All other components within normal limits  CBC WITH DIFFERENTIAL/PLATELET  COMPREHENSIVE METABOLIC PANEL  DIGOXIN LEVEL  MAGNESIUM  TROPONIN I    Imaging Review No results found. I have personally reviewed and evaluated these images and lab results as part of my medical decision-making.   EKG Interpretation   Date/Time:  Friday June 27 2016 14:11:53 EDT Ventricular Rate:  130 PR Interval:    QRS Duration: 239 QT Interval:  422 QTC Calculation: 587 R Axis:   117 Text Interpretation:  Sinus tachycardia Paired ventricular premature  complexes Nonspecific intraventricular conduction delay Lateral infarct,  acute Probable  anteroseptal infarct, recent Baseline wander in lead(s) V2  Confirmed by Ranae PalmsYELVERTON  MD, Rosalin Buster ([id number]) on 06/22/2016 3:31:32 PM     CRITICAL CARE Performed by: Ranae PalmsYELVERTON, Devika Dragovich Total critical care time: 65 minutes Critical care time was exclusive of separately billable procedures and treating other patients. Critical care was necessary to treat or prevent imminent or life-threatening deterioration. Critical care was time spent personally by me on the following activities: development of treatment plan with patient and/or surrogate as well as nursing, discussions with consultants, evaluation of patient's response to treatment, examination of patient, obtaining history from patient or surrogate, ordering and performing treatments and interventions, ordering and review of laboratory studies, ordering and review of radiographic studies, pulse oximetry and re-evaluation of patient's condition. MDM   Final diagnoses:  Cardiopulmonary arrest (HCC)  Acute renal failure, unspecified acute renal failure type (HCC)  Hyperkalemia     Underlying cardiac rhythm was bradycardia with a rate in the 30's on presentation. Wide QRS complex noted. There was intermittent capture with pacing. Patient was given atropine and epinephrine. Progression of the QRS widening to sign wave and loss of pulses shortly after arrival to the emergency department. CPR was initiated. Patient was intubated with minimal interruption of chest compressions. See code sheet for details. Multiple rounds of epinephrine and calcium were given due to suspected hyperkalemia. She also received 2 g of magnesium and several amps of bicarbonate. He was given insulin and D50. Patient had brief intermittent return of pulses. I-STAT Chem-8 demonstrated acute renal failure with hyperkalemia of 7.7. Discussed dialysis options with Dr Kristian CoveyBefekadu but patient was never stable enough to be dialyzed. Per the patient's med sheet that was brought in with EMS, he was  taking digoxin. Digibind was also given with no effect. During the course of resuscitation patient's pupils became fixed and dilated at 6 mm. Discussed with Dr. Janna ArchonDiego who had seen the patient in his office this morning. Had stable vital signs this morning and was in his normal state of health .Patient was in PEA with wide QRS complex which then transitioned to asystole. 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  Resuscitation was stopped after nearly 75 minutes. Pronounced dead at 1509. Discussed with family and all questions answered. Primary physician will sign death certificate.    Loren Raceravid Jakavion Bilodeau, MD 07/10/2016 [phone]

## 2016-07-22 NOTE — ED Notes (Signed)
Pt taken to morgue. 

## 2016-07-22 DEATH — deceased

## 2016-08-02 IMAGING — CR DG CHEST 1V PORT
1 series · 1 of 1 positions shown · non-contrast
Comparison: 08/08/2015

CLINICAL DATA: Chest pain and started 1 hour ago.

EXAM:
PORTABLE CHEST - 1 VIEW

[ap portable]
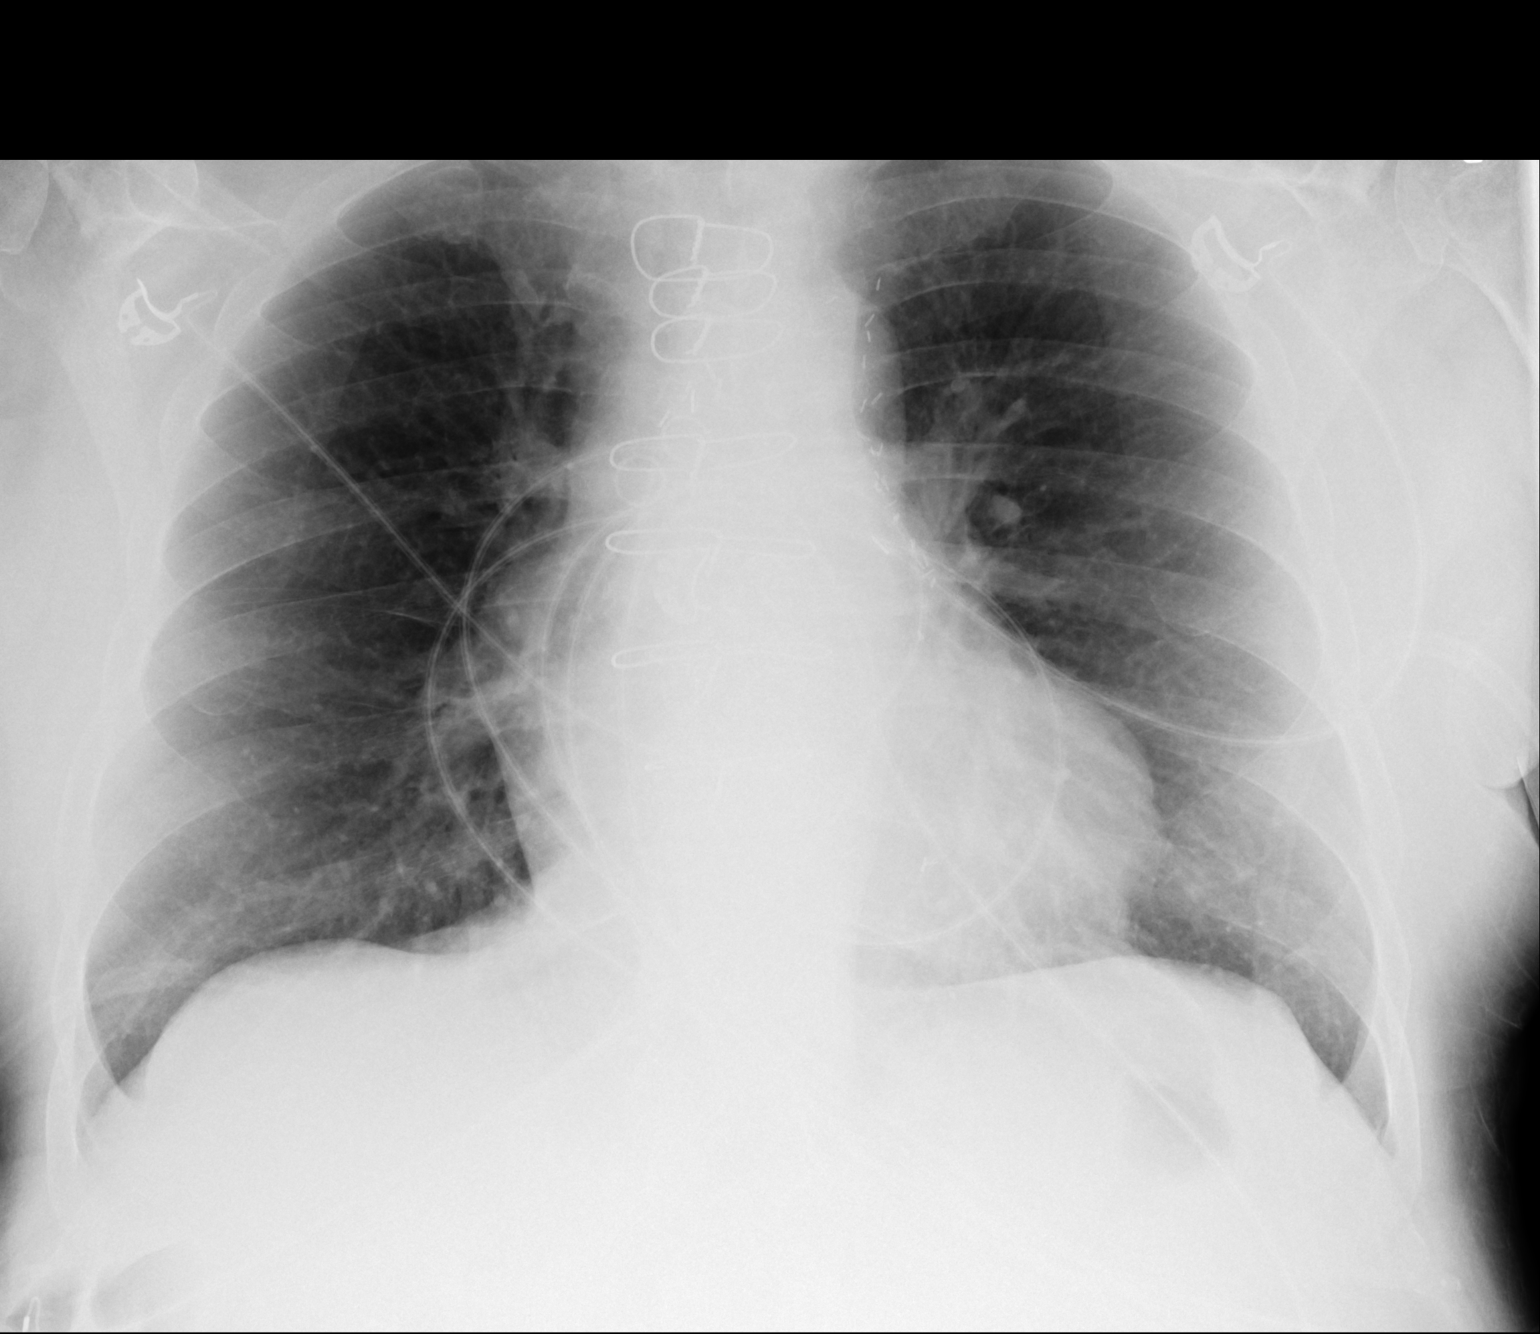

[1 of 1 positions shown; findings below may reference images not displayed]

FINDINGS: There is no focal parenchymal opacity. There is no pleural effusion
or pneumothorax. The heart and mediastinal contours are
unremarkable. There is evidence of prior CABG.

The osseous structures are unremarkable.
IMPRESSION: No active disease.

## 2016-08-20 IMAGING — CR DG CHEST 1V PORT
1 series · 2 of 2 positions shown · non-contrast
Comparison: Radiograph dated 08/15/2015

CLINICAL DATA: 68-year-old male with chest pain

EXAM:
PORTABLE CHEST - 1 VIEW

[Series 1: ap portable · 0.17mm/px · 2 of 2 slices shown]
[im 1/2]
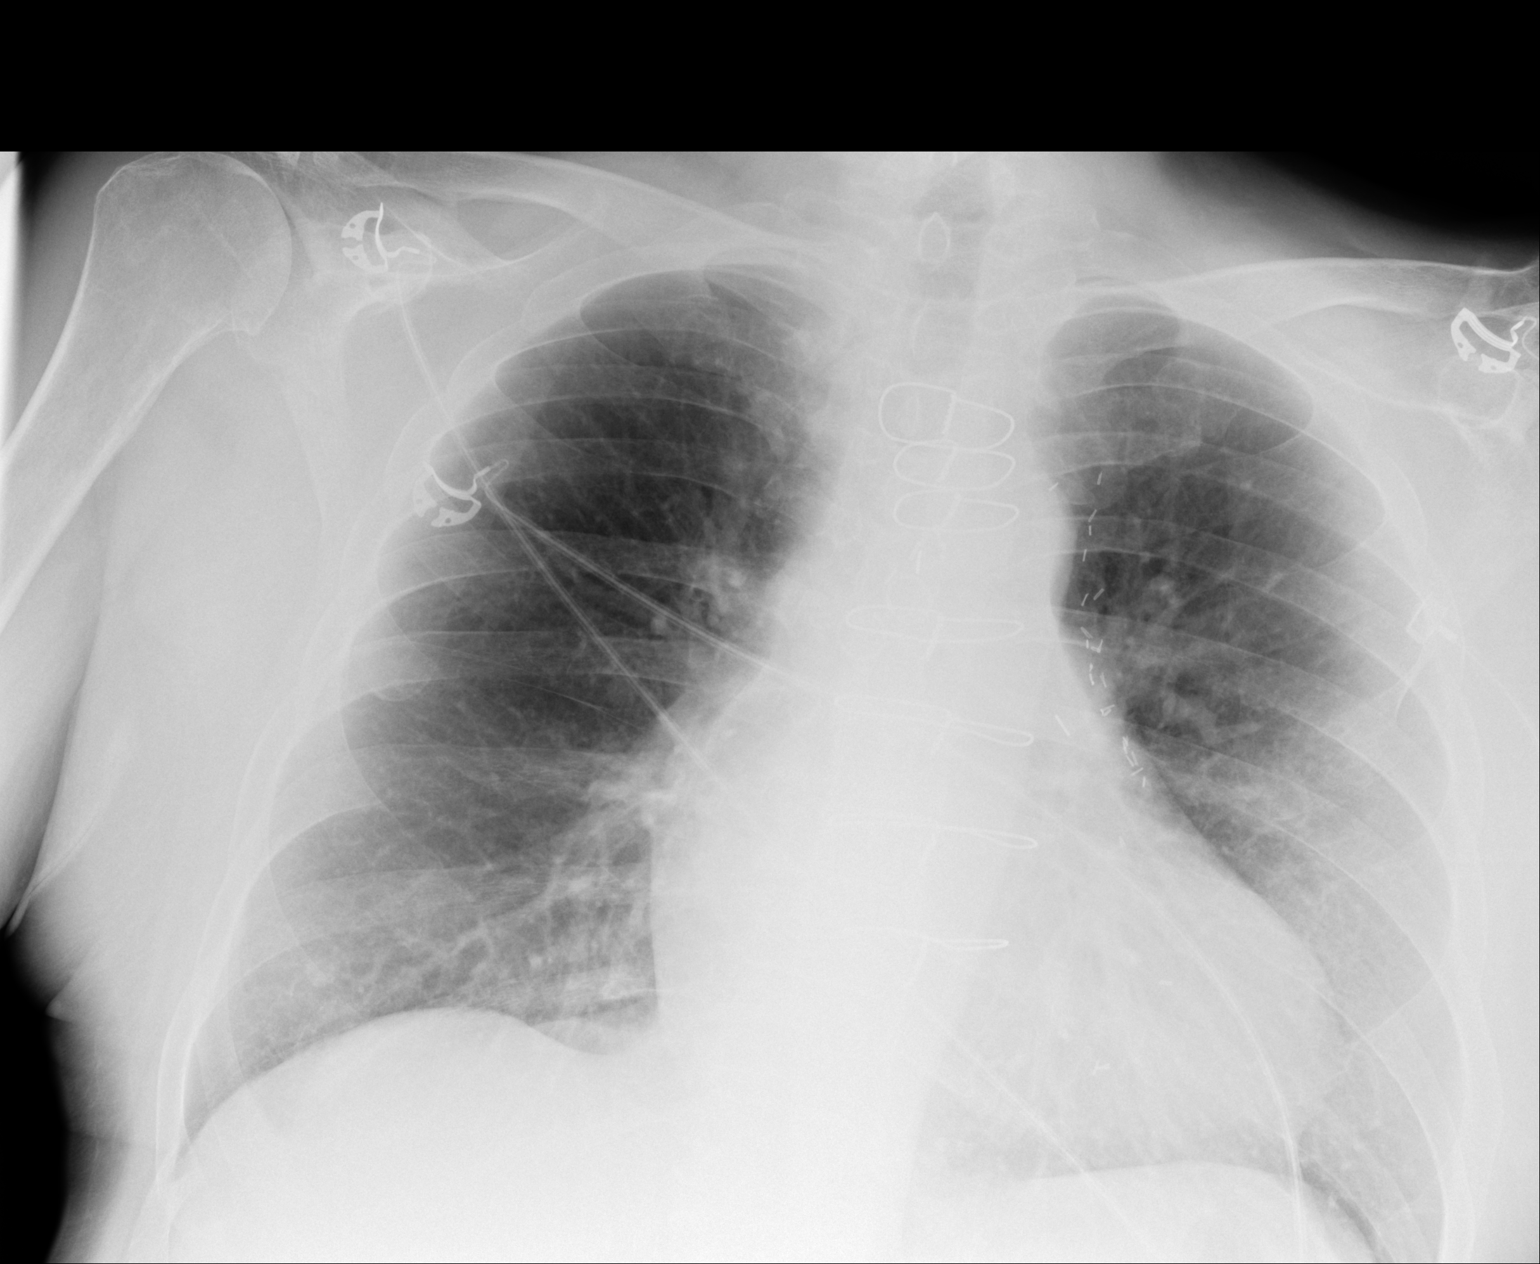
[im 2/2]
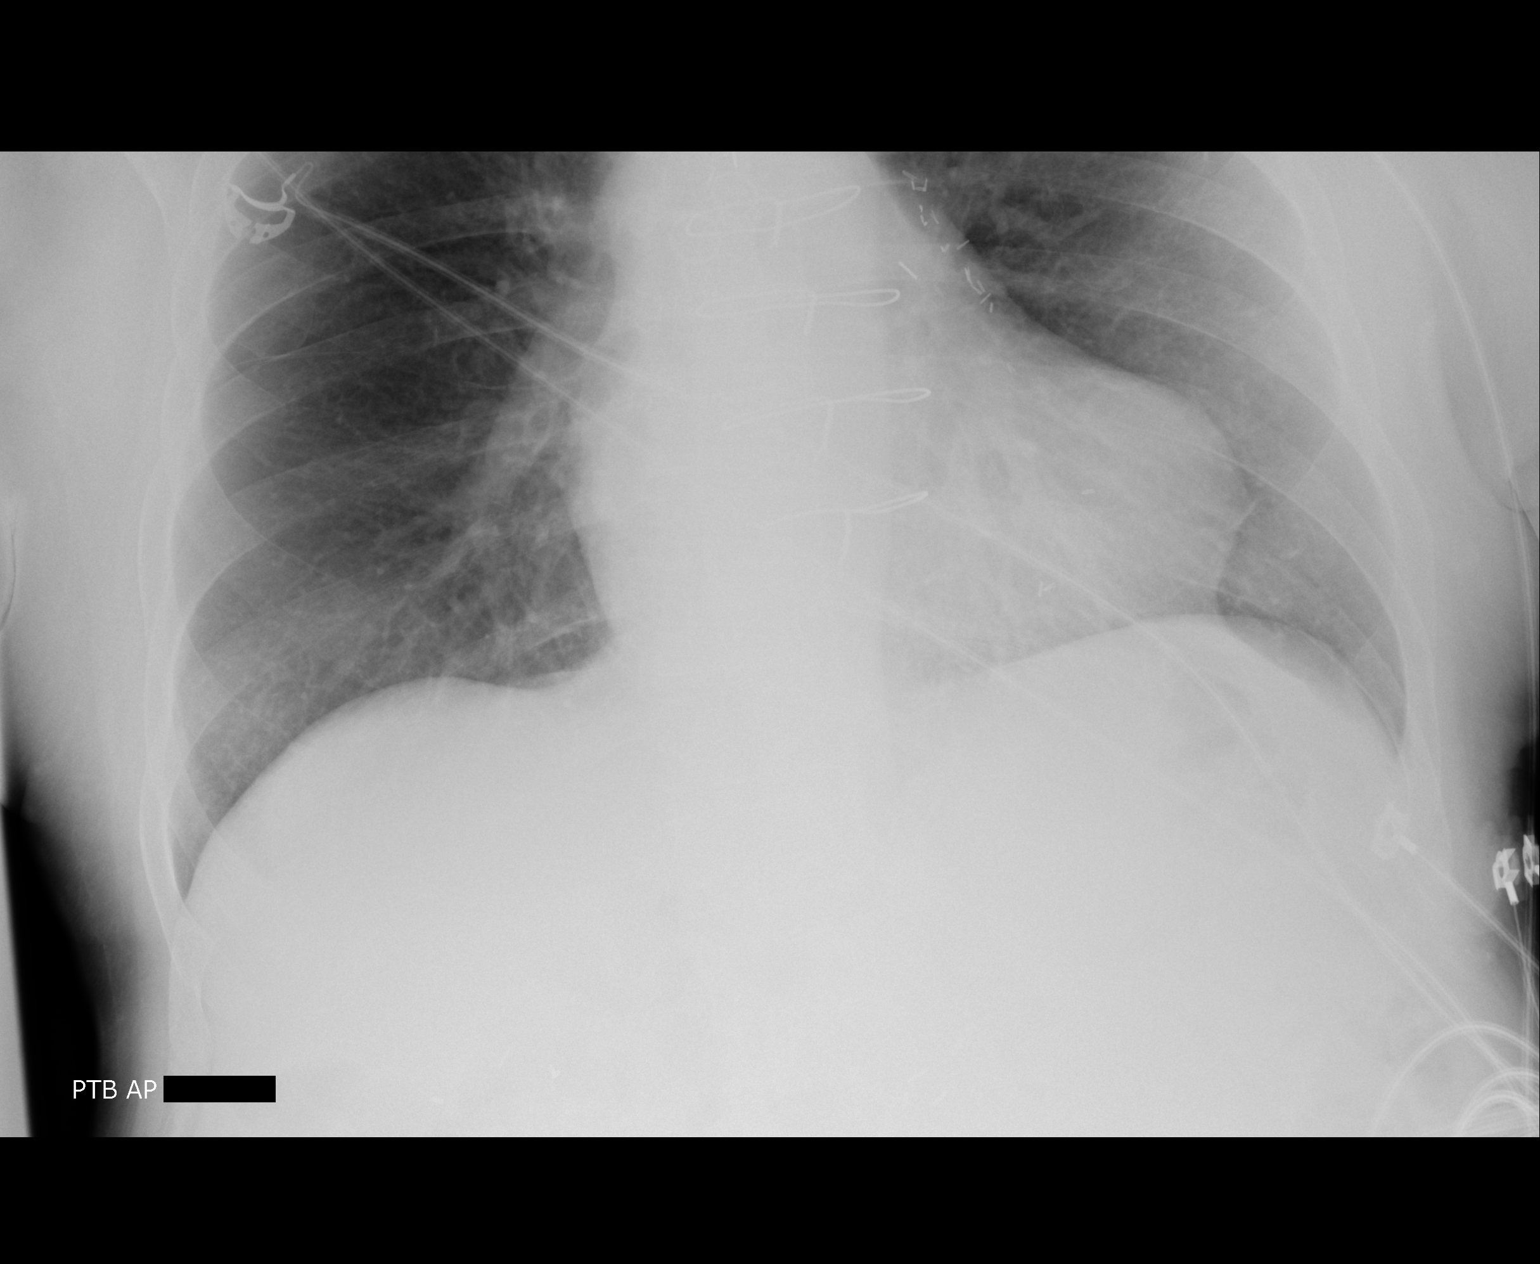

[2 of 2 positions shown; findings below may reference images not displayed]

FINDINGS: Single-view of the chest demonstrate emphysematous changes. No focal
consolidation, pleural effusion, or pneumothorax. Stable
cardiomegaly. Median sternotomy wires and CABG clips.
IMPRESSION: No acute cardiopulmonary process.  No interval change.

## 2016-09-18 NOTE — Progress Notes (Signed)
This encounter was created in error - please disregard.

## 2017-04-19 IMAGING — CR DG CHEST 1V PORT
1 series · 1 of 1 positions shown · non-contrast
Comparison: 04/23/2016

CLINICAL DATA: Recently found slumped over awaiting surgical
procedure, initial encounter

EXAM:
PORTABLE CHEST 1 VIEW

[ap portable]
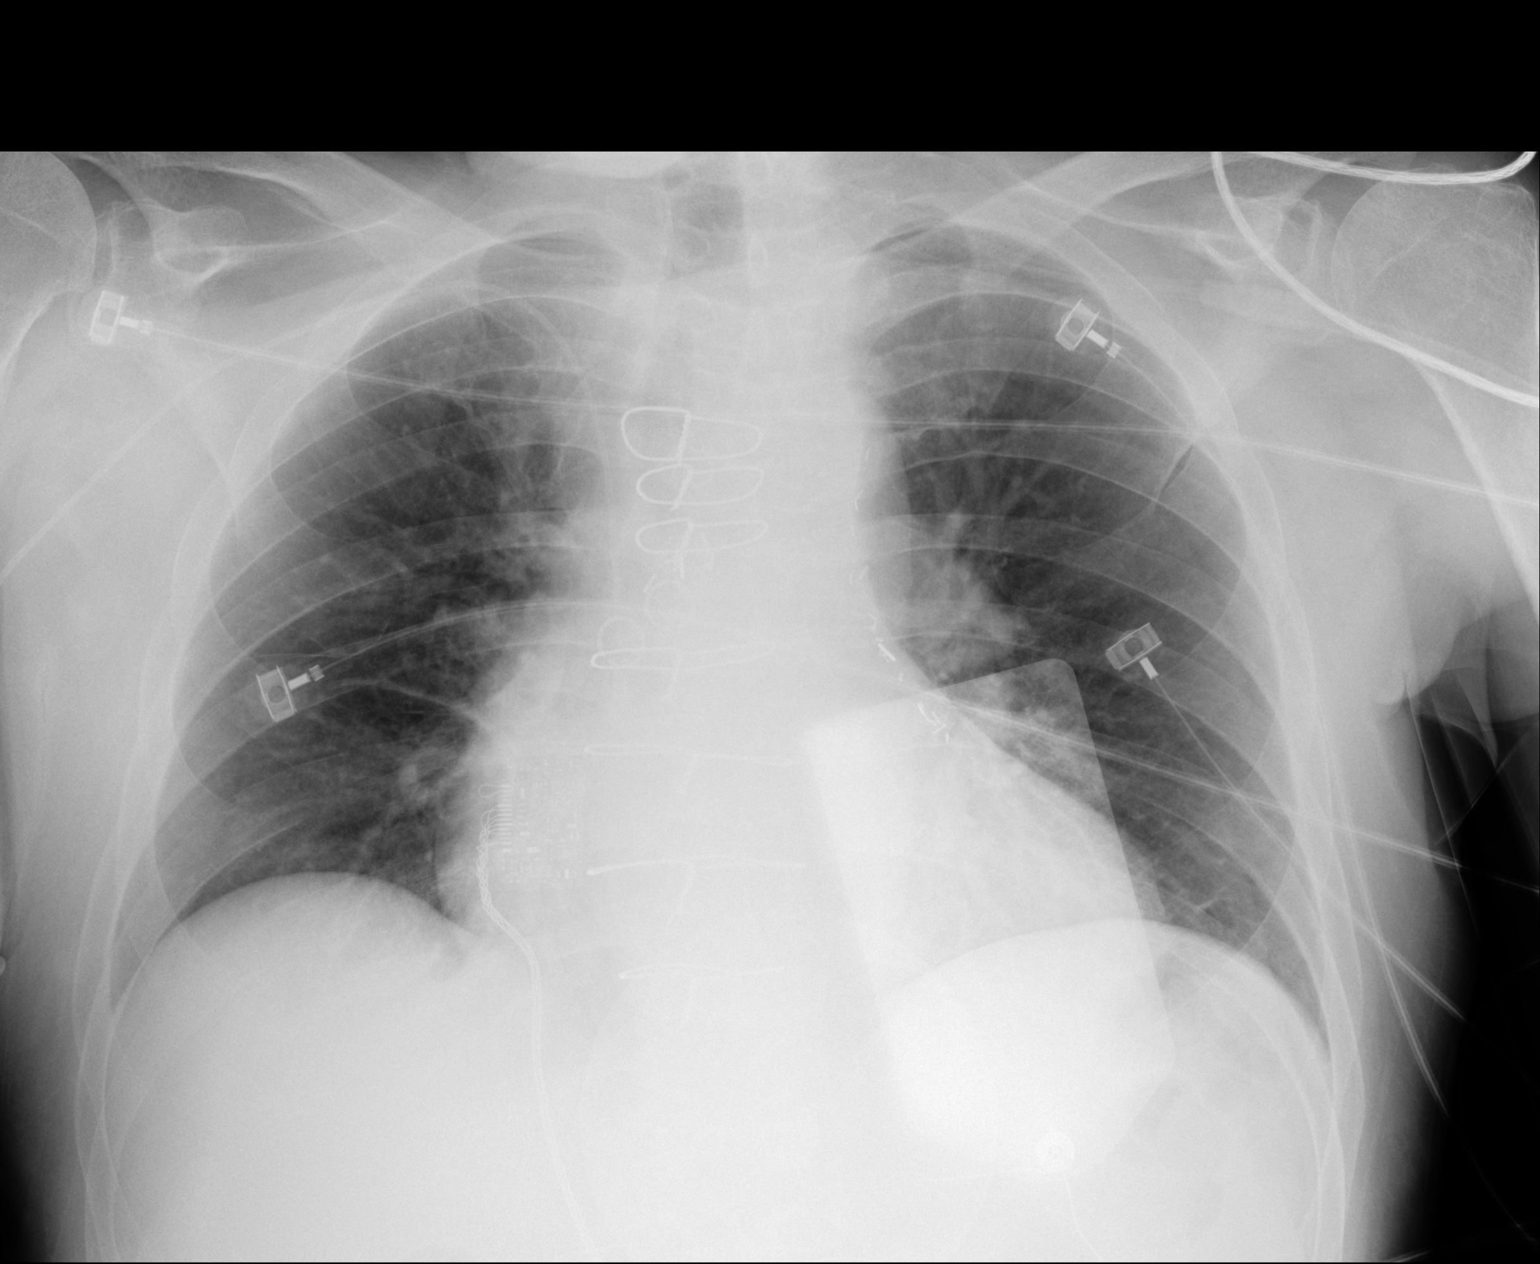

[1 of 1 positions shown; findings below may reference images not displayed]

FINDINGS: Right-sided PICC line is again noted. Cardiac shadow is stable.
Postsurgical changes are again seen. The lungs are clear
bilaterally. No bony abnormality is noted.
IMPRESSION: No acute abnormality noted.
